# Patient Record
Sex: Female | Born: 1940 | Race: White | Hispanic: No | Marital: Married | Smoking: Never smoker
Health system: Southern US, Community
[De-identification: ages and names within clinical notes are randomized; demographics above are authoritative.]

## PROBLEM LIST (undated history)

## (undated) DIAGNOSIS — Z90710 Acquired absence of both cervix and uterus: Secondary | ICD-10-CM

## (undated) DIAGNOSIS — E079 Disorder of thyroid, unspecified: Secondary | ICD-10-CM

## (undated) DIAGNOSIS — T7840XA Allergy, unspecified, initial encounter: Secondary | ICD-10-CM

## (undated) DIAGNOSIS — R000 Tachycardia, unspecified: Secondary | ICD-10-CM

## (undated) DIAGNOSIS — M1990 Unspecified osteoarthritis, unspecified site: Secondary | ICD-10-CM

## (undated) DIAGNOSIS — K219 Gastro-esophageal reflux disease without esophagitis: Secondary | ICD-10-CM

## (undated) DIAGNOSIS — E785 Hyperlipidemia, unspecified: Secondary | ICD-10-CM

## (undated) DIAGNOSIS — K449 Diaphragmatic hernia without obstruction or gangrene: Secondary | ICD-10-CM

## (undated) HISTORY — DX: Allergy, unspecified, initial encounter: T78.40XA

## (undated) HISTORY — DX: Disorder of thyroid, unspecified: E07.9

## (undated) HISTORY — DX: Acquired absence of both cervix and uterus: Z90.710

## (undated) HISTORY — DX: Unspecified osteoarthritis, unspecified site: M19.90

## (undated) HISTORY — PX: BREAST BIOPSY: SHX20

## (undated) HISTORY — PX: TONSILLECTOMY: SUR1361

## (undated) HISTORY — DX: Hyperlipidemia, unspecified: E78.5

## (undated) HISTORY — DX: Tachycardia, unspecified: R00.0

## (undated) HISTORY — DX: Gastro-esophageal reflux disease without esophagitis: K21.9

## (undated) HISTORY — PX: OTHER SURGICAL HISTORY: SHX169

## (undated) HISTORY — DX: Diaphragmatic hernia without obstruction or gangrene: K44.9

---

## 1978-01-24 HISTORY — PX: ABDOMINAL HYSTERECTOMY: SHX81

## 1987-01-25 DIAGNOSIS — K449 Diaphragmatic hernia without obstruction or gangrene: Secondary | ICD-10-CM

## 1987-01-25 HISTORY — DX: Diaphragmatic hernia without obstruction or gangrene: K44.9

## 1997-01-24 HISTORY — PX: ESOPHAGOGASTRIC FUNDOPLICATION: SHX405

## 1997-01-24 HISTORY — PX: LAPAROSCOPIC NISSEN FUNDOPLICATION: SHX1932

## 1998-09-22 ENCOUNTER — Encounter: Payer: Self-pay | Admitting: Gastroenterology

## 1998-09-22 ENCOUNTER — Ambulatory Visit (HOSPITAL_COMMUNITY): Admission: RE | Admit: 1998-09-22 | Discharge: 1998-09-22 | Payer: Self-pay | Admitting: Gastroenterology

## 1998-09-29 ENCOUNTER — Ambulatory Visit (HOSPITAL_COMMUNITY): Admission: RE | Admit: 1998-09-29 | Discharge: 1998-09-29 | Payer: Self-pay | Admitting: Gastroenterology

## 1998-10-26 ENCOUNTER — Ambulatory Visit (HOSPITAL_COMMUNITY): Admission: RE | Admit: 1998-10-26 | Discharge: 1998-10-26 | Payer: Self-pay | Admitting: Gastroenterology

## 1998-11-27 ENCOUNTER — Ambulatory Visit (HOSPITAL_COMMUNITY): Admission: RE | Admit: 1998-11-27 | Discharge: 1998-11-27 | Payer: Self-pay | Admitting: Gastroenterology

## 1998-11-27 ENCOUNTER — Encounter: Payer: Self-pay | Admitting: Internal Medicine

## 2004-02-26 ENCOUNTER — Ambulatory Visit: Payer: Self-pay | Admitting: General Practice

## 2004-11-17 ENCOUNTER — Ambulatory Visit: Payer: Self-pay | Admitting: Internal Medicine

## 2005-03-14 ENCOUNTER — Ambulatory Visit: Payer: Self-pay | Admitting: Internal Medicine

## 2005-11-14 ENCOUNTER — Ambulatory Visit: Payer: Self-pay | Admitting: Gastroenterology

## 2005-12-20 ENCOUNTER — Ambulatory Visit: Payer: Self-pay | Admitting: Gastroenterology

## 2006-02-14 ENCOUNTER — Ambulatory Visit: Payer: Self-pay | Admitting: Internal Medicine

## 2006-10-20 ENCOUNTER — Ambulatory Visit: Payer: Self-pay | Admitting: Otolaryngology

## 2007-03-27 ENCOUNTER — Ambulatory Visit: Payer: Self-pay | Admitting: Internal Medicine

## 2007-11-20 ENCOUNTER — Ambulatory Visit: Payer: Self-pay | Admitting: Internal Medicine

## 2008-02-21 ENCOUNTER — Ambulatory Visit: Payer: Self-pay | Admitting: Gastroenterology

## 2008-04-09 ENCOUNTER — Ambulatory Visit: Payer: Self-pay | Admitting: Internal Medicine

## 2008-04-22 ENCOUNTER — Ambulatory Visit: Payer: Self-pay | Admitting: Internal Medicine

## 2008-11-04 ENCOUNTER — Ambulatory Visit: Payer: Self-pay | Admitting: General Surgery

## 2009-02-10 ENCOUNTER — Ambulatory Visit: Payer: Self-pay | Admitting: Otolaryngology

## 2009-04-29 ENCOUNTER — Ambulatory Visit: Payer: Self-pay | Admitting: General Surgery

## 2009-06-30 ENCOUNTER — Ambulatory Visit: Payer: Self-pay | Admitting: Unknown Physician Specialty

## 2009-10-29 ENCOUNTER — Ambulatory Visit: Payer: Self-pay | Admitting: Family Medicine

## 2009-12-02 ENCOUNTER — Encounter: Admission: RE | Admit: 2009-12-02 | Discharge: 2009-12-02 | Payer: Self-pay | Admitting: Allergy and Immunology

## 2010-01-11 ENCOUNTER — Ambulatory Visit: Payer: Self-pay | Admitting: Gastroenterology

## 2010-01-13 LAB — PATHOLOGY REPORT

## 2010-05-25 ENCOUNTER — Ambulatory Visit: Payer: Self-pay | Admitting: Family Medicine

## 2010-05-26 ENCOUNTER — Ambulatory Visit: Payer: Self-pay | Admitting: Family Medicine

## 2010-07-30 ENCOUNTER — Ambulatory Visit: Payer: Self-pay | Admitting: Family Medicine

## 2010-12-06 ENCOUNTER — Encounter: Payer: Self-pay | Admitting: Cardiovascular Disease

## 2011-01-03 ENCOUNTER — Ambulatory Visit (INDEPENDENT_AMBULATORY_CARE_PROVIDER_SITE_OTHER): Payer: Medicare Other | Admitting: Cardiovascular Disease

## 2011-01-03 ENCOUNTER — Encounter: Payer: Self-pay | Admitting: Cardiovascular Disease

## 2011-01-03 VITALS — BP 128/78 | Ht 64.0 in | Wt 139.1 lb

## 2011-01-03 DIAGNOSIS — R002 Palpitations: Secondary | ICD-10-CM

## 2011-01-03 DIAGNOSIS — R000 Tachycardia, unspecified: Secondary | ICD-10-CM

## 2011-01-03 DIAGNOSIS — K219 Gastro-esophageal reflux disease without esophagitis: Secondary | ICD-10-CM | POA: Insufficient documentation

## 2011-01-03 DIAGNOSIS — E7800 Pure hypercholesterolemia, unspecified: Secondary | ICD-10-CM | POA: Insufficient documentation

## 2011-01-03 DIAGNOSIS — E785 Hyperlipidemia, unspecified: Secondary | ICD-10-CM

## 2011-01-03 MED ORDER — ATORVASTATIN CALCIUM 10 MG PO TABS: 10.0000 mg | ORAL_TABLET | Freq: Every day | ORAL | Status: DC

## 2011-01-03 NOTE — Progress Notes (Signed)
Patient ID: ZAMZAM WHINERY, female    DOB: [birth year], 70 y.o.   MRN: [id number]  HPI Comments: [name] is a very pleasant 70 year old woman with history of hyperlipidemia, strong family history of coronary artery disease and stroke, history of hilar hernia with a Nissen fundoplication in 1999, chronic GERD symptoms, Barrett's esophagus who presents for evaluation of palpitations. She is a patient of Dr. Andrey Spearman.  She reports that for the past 6 months, she has palpitations when she lies down at night time. She does not appreciate the symptoms in the daytime when she is active, only at night. Symptoms have been getting somewhat worse and that now she has them almost every night. They do not wake her from sleep. She describes them as a strong fluttering though the heart rate is not fast when she checks her pulse. Using a blood pressure cuff has shown normal rate and normal blood pressure. She does have significant anxiety with numerous issues on her mind. Sometimes she has difficulty getting to sleep. She is otherwise active and does not have any other complaints.   She has many siblingsIn many of them have had either stroke or heart attack.  She does have significant symptoms of GERD at nighttime. She has followup with Dr. Baldomero Lamy for repeat EGD in one month's time.  EKG shows normal sinus rhythm with rate 67 beats per minute with no significant ST or T wave changes EKG done at an outside office shows normal LV function, trivial MR and TR, essentially normal study   Outpatient Encounter Prescriptions as of 01/03/2011  Medication Sig Dispense Refill  . omeprazole (PRILOSEC) 20 MG capsule Take 20 mg by mouth daily.        Marland Kitchen atorvastatin (LIPITOR) 10 MG tablet Take 1 tablet (10 mg total) by mouth daily.  30 tablet  11     Review of Systems  Constitutional: Negative.   HENT: Negative.   Eyes: Negative.   Respiratory: Negative.   Cardiovascular: Positive for palpitations.    Gastrointestinal: Negative.        GERD symptoms  Musculoskeletal: Negative.   Skin: Negative.   Neurological: Negative.   Hematological: Negative.   Psychiatric/Behavioral: Negative.   All other systems reviewed and are negative.    BP 128/78  Ht 5\' 4"  (1.626 m)  Wt 139 lb 1.9 oz (63.104 kg)  BMI 23.88 kg/m2   Physical Exam  Nursing note and vitals reviewed. Constitutional: She is oriented to person, place, and time. She appears well-developed and well-nourished.  HENT:  Head: Normocephalic.  Nose: Nose normal.  Mouth/Throat: Oropharynx is clear and moist.  Eyes: Conjunctivae are normal. Pupils are equal, round, and reactive to light.  Neck: Normal range of motion. Neck supple. No JVD present.  Cardiovascular: Normal rate, regular rhythm, S1 normal, S2 normal, normal heart sounds and intact distal pulses.  Exam reveals no gallop and no friction rub.   No murmur heard. Pulmonary/Chest: Effort normal and breath sounds normal. No respiratory distress. She has no wheezes. She has no rales. She exhibits no tenderness.  Abdominal: Soft. Bowel sounds are normal. She exhibits no distension. There is no tenderness.  Musculoskeletal: Normal range of motion. She exhibits no edema and no tenderness.  Lymphadenopathy:    She has no cervical adenopathy.  Neurological: She is alert and oriented to person, place, and time. Coordination normal.  Skin: Skin is warm and dry. No rash noted. No erythema.  Psychiatric: She has a normal mood and  affect. Her behavior is normal. Judgment and thought content normal.         Assessment and Plan

## 2011-01-03 NOTE — Assessment & Plan Note (Signed)
I'm concerned about her strong family history of stroke and heart attack. We have suggested she start low-dose Lipitor 10 mg daily. This can be titrated upwards slowly as needed. We have also suggested she talk with Dr. Marva Panda about starting low-dose aspirin.

## 2011-01-03 NOTE — Assessment & Plan Note (Signed)
We have suggested she take omeprazole 40 mg daily, lift the head of the bed. She has repeat EGD in one month.

## 2011-01-03 NOTE — Assessment & Plan Note (Signed)
I suspect her symptoms are from either anxiety at night time, unable to exclude ectopy. As she reports her heart rate is typically not elevated, it is less likely an atrial tachycardia or SVT. We did offer low dose beta blocker at nighttime p.r.n. And Holter monitor. She does have severe symptoms of heartburn at night time. She would like to see the repeat EGD report before he Holter or any medications. Clinically, EKG and exam is benign. Atrial tachycardia and SVT likely present during the daytime as well as nighttime and likely less likely the cause.  We will see her back in followup in 6 weeks' time after her EGD.

## 2011-01-03 NOTE — Patient Instructions (Addendum)
You are doing well. Please start lipitor 10 mg daily Please call the office if you would like a heart monitor before the next visit Call if you would like the medication to slow the heart rhythm before the next visit  Please call us if you have new issues that need to be addressed before your next appt.  The office will contact you for a follow up Appt. In [phone] [id number]  The number for [facility] in [place] (Dr. Dan Humphreys and Darrick Huntsman) is  [phone]

## 2011-02-14 ENCOUNTER — Ambulatory Visit: Payer: Medicare Other | Admitting: Cardiovascular Disease

## 2011-02-23 ENCOUNTER — Encounter: Payer: Self-pay | Admitting: Cardiovascular Disease

## 2011-02-23 ENCOUNTER — Ambulatory Visit (INDEPENDENT_AMBULATORY_CARE_PROVIDER_SITE_OTHER): Payer: Medicare Other | Admitting: Cardiovascular Disease

## 2011-02-23 DIAGNOSIS — E785 Hyperlipidemia, unspecified: Secondary | ICD-10-CM

## 2011-02-23 DIAGNOSIS — R002 Palpitations: Secondary | ICD-10-CM

## 2011-02-23 DIAGNOSIS — K219 Gastro-esophageal reflux disease without esophagitis: Secondary | ICD-10-CM

## 2011-02-23 NOTE — Assessment & Plan Note (Signed)
We have suggested she could try omeprazole 20 mg b.i.d. For her symptoms. She has an EGD in several weeks.

## 2011-02-23 NOTE — Progress Notes (Signed)
Patient ID: Carrie Noble, female    DOB: Jul 14, 1940, 71 y.o.   MRN: [id number]  HPI Comments: Carrie Noble is a very pleasant 71 year old woman with history of hyperlipidemia, strong family history of coronary artery disease and stroke, history of hilar hernia with a Nissen fundoplication in 1999, chronic GERD symptoms, Barrett's esophagus who presents for evaluation of palpitations. She is a patient of Dr. Andrey Spearman.  She has had a long history of palpitations at nighttime when she tries to sleep, dating back more than 6 months. She continues to have these symptoms, reporting it feels like a fluttering in her upper epigastric area only at nighttime, only when she lies down. She is very active in the daytime and has no symptoms of palpitations, chest pain or shortness of breath with activity. She has had problems in the past with reflux. She does take ramipril all in the morning, with no medications at night. She is scheduled for repeat EGD in late February for her symptoms. She is concerned about arrhythmia though has measured her heart beat using a monitor when she has symptoms and reports heart rates in the 70s. Does feel irregular.  She has not started her low-dose Lipitor as she is afraid. She reports her sister is currently in the hospital having bypass surgery and many other siblings have had either stroke or heart attack.  EKG shows normal sinus rhythm with rate 75 beats per minute with no significant ST or T wave changes   Outpatient Encounter Prescriptions as of 02/23/2011  Medication Sig Dispense Refill  . omeprazole (PRILOSEC) 20 MG capsule Take 20 mg by mouth daily.        Marland Kitchen atorvastatin (LIPITOR) 10 MG tablet Take 1 tablet (10 mg total) by mouth daily. NOT TAKING YET  30 tablet  11    Review of Systems  Constitutional: Negative.   HENT: Negative.   Eyes: Negative.   Respiratory: Negative.   Cardiovascular: Positive for palpitations.  Gastrointestinal: Negative.        GERD  symptoms  Musculoskeletal: Negative.   Skin: Negative.   Neurological: Negative.   Hematological: Negative.   Psychiatric/Behavioral: Negative.   All other systems reviewed and are negative.    BP 136/86  Pulse 75  Ht 5\' 4"  (1.626 m)  Wt 143 lb (64.864 kg)  BMI 24.55 kg/m2   Physical Exam  Nursing note and vitals reviewed. Constitutional: She is oriented to person, place, and time. She appears well-developed and well-nourished.  HENT:  Head: Normocephalic.  Nose: Nose normal.  Mouth/Throat: Oropharynx is clear and moist.  Eyes: Conjunctivae are normal. Pupils are equal, round, and reactive to light.  Neck: Normal range of motion. Neck supple. No JVD present.  Cardiovascular: Normal rate, regular rhythm, S1 normal, S2 normal, normal heart sounds and intact distal pulses.  Exam reveals no gallop and no friction rub.   No murmur heard. Pulmonary/Chest: Effort normal and breath sounds normal. No respiratory distress. She has no wheezes. She has no rales. She exhibits no tenderness.  Abdominal: Soft. Bowel sounds are normal. She exhibits no distension. There is no tenderness.  Musculoskeletal: Normal range of motion. She exhibits no edema and no tenderness.  Lymphadenopathy:    She has no cervical adenopathy.  Neurological: She is alert and oriented to person, place, and time. Coordination normal.  Skin: Skin is warm and dry. No rash noted. No erythema.  Psychiatric: She has a normal mood and affect. Her behavior is normal. Judgment and  thought content normal.         Assessment and Plan

## 2011-02-23 NOTE — Assessment & Plan Note (Signed)
Given her strong family history, we have suggested she start a low-dose cholesterol medication, lipitor 10 mg daily.

## 2011-02-23 NOTE — Patient Instructions (Addendum)
You are doing well. No medication changes were made. We will place a two day monitor for you palpitations. We wil call you with the results.  Please call us if you have new issues that need to be addressed before your next appt.  Your physician wants you to follow-up in: 6 months.  You will receive a reminder letter in the mail two months in advance. If you don't receive a letter, please call our office to schedule the follow-up appointment.    Your physician has recommended that you wear a holter monitor. Holter monitors are medical devices that record the heart's electrical activity. Doctors most often use these monitors to diagnose arrhythmias. Arrhythmias are problems with the speed or rhythm of the heartbeat. The monitor is a small, portable device. You can wear one while you do your normal daily activities. This is usually used to diagnose what is causing palpitations/syncope (passing out).

## 2011-02-23 NOTE — Assessment & Plan Note (Signed)
She continues to be concerned about the fluttering in her upper epigastric area at nighttime. We will order a Holter monitor for 48 hours to evaluate her symptoms.   She denies any problems with exertion and although she does have a strong family history, we will hold off on stress testing at this time.

## 2011-03-04 ENCOUNTER — Telehealth: Payer: Self-pay

## 2011-03-04 NOTE — Telephone Encounter (Signed)
Notified patient per Dr. Mariah Milling Holter Monitor did not show any significant arrhythmias.

## 2011-03-07 NOTE — Progress Notes (Signed)
Addended by: Festus Aloe on: 03/07/2011 10:42 AM   Modules accepted: Orders

## 2011-03-22 ENCOUNTER — Ambulatory Visit: Payer: Self-pay | Admitting: Gastroenterology

## 2011-07-06 ENCOUNTER — Ambulatory Visit: Payer: Self-pay | Admitting: Gastroenterology

## 2011-10-26 ENCOUNTER — Ambulatory Visit: Payer: Self-pay | Admitting: Internal Medicine

## 2011-11-25 ENCOUNTER — Ambulatory Visit: Payer: Self-pay | Admitting: Internal Medicine

## 2011-12-27 ENCOUNTER — Ambulatory Visit: Payer: Medicare Other | Admitting: Internal Medicine

## 2012-01-25 HISTORY — PX: BREAST EXCISIONAL BIOPSY: SUR124

## 2012-05-28 ENCOUNTER — Ambulatory Visit (INDEPENDENT_AMBULATORY_CARE_PROVIDER_SITE_OTHER): Payer: Medicare Other | Admitting: Internal Medicine

## 2012-05-28 ENCOUNTER — Encounter: Payer: Self-pay | Admitting: Internal Medicine

## 2012-05-28 VITALS — BP 130/80 | HR 72 | Temp 98.5°F | Wt 141.5 lb

## 2012-05-28 DIAGNOSIS — E785 Hyperlipidemia, unspecified: Secondary | ICD-10-CM

## 2012-05-28 DIAGNOSIS — E041 Nontoxic single thyroid nodule: Secondary | ICD-10-CM | POA: Insufficient documentation

## 2012-05-28 DIAGNOSIS — K2270 Barrett's esophagus without dysplasia: Secondary | ICD-10-CM

## 2012-05-28 DIAGNOSIS — M810 Age-related osteoporosis without current pathological fracture: Secondary | ICD-10-CM

## 2012-05-28 DIAGNOSIS — E049 Nontoxic goiter, unspecified: Secondary | ICD-10-CM

## 2012-05-28 DIAGNOSIS — M25559 Pain in unspecified hip: Secondary | ICD-10-CM

## 2012-05-28 DIAGNOSIS — K219 Gastro-esophageal reflux disease without esophagitis: Secondary | ICD-10-CM

## 2012-05-28 DIAGNOSIS — R002 Palpitations: Secondary | ICD-10-CM

## 2012-05-28 DIAGNOSIS — M25551 Pain in right hip: Secondary | ICD-10-CM

## 2012-05-28 MED ORDER — PANTOPRAZOLE SODIUM 40 MG PO TBEC: 40.0000 mg | DELAYED_RELEASE_TABLET | Freq: Every day | ORAL | Status: DC

## 2012-05-29 ENCOUNTER — Encounter: Payer: Self-pay | Admitting: Internal Medicine

## 2012-05-29 DIAGNOSIS — M810 Age-related osteoporosis without current pathological fracture: Secondary | ICD-10-CM | POA: Insufficient documentation

## 2012-05-29 NOTE — Progress Notes (Signed)
Subjective:    Patient ID: Carrie Noble, female    DOB: 1940/01/27, 72 y.o.   MRN: [id number]  HPI 72 year old female with past history of GERD with Barretts and a hiatal hernia, arthritis, hypercholesterolemia, thyroid goiter and osteoporosis.  She comes in today to follow up on these issues as well as to establish care.  Former patient of Dr Alison Murray, Dr Andrey Spearman and Dr Barnabas Lister.  She states she stays active.  No cardiac symptoms with increased activity or exertion.  She is having some right hip pain.  She also reports some pain in her tail bone.  She has had xrays previously and was told she had arthritis.  Had previously had injection (hip) by Dr Gavin Potters.  This helped.  Flaring again.  She has acid reflux.  On prilosec.  Has had issues with prilosec previously and wants to change to a different PPI.  Had palpitations.  Saw Dr Mariah Milling.  Had monitor - ok.  Once she stopped prilosec and started prevacid - symptoms resolved.  States when her last rx called in - was called in for prilosec.  She wants to change now.  Has known Barretts.  Followed by Dr Marva Panda.  Has IBS. Metamucil helps.     Past Medical History  Diagnosis Date  . Hyperlipidemia   . Gastroesophageal reflux   . Hiatal hernia 1989    status post Nissen fundoplication   . Hx of hysterectomy   . Tachycardia     Patient stated that this has been occuring frequently.  . Arthritis   . Allergy   . Thyroid disease     Goiter    Outpatient Encounter Prescriptions as of 05/28/2012  Medication Sig Dispense Refill  . azelastine (ASTELIN) 137 MCG/SPRAY nasal spray Place 1 spray into the nose as needed for rhinitis. Use in each nostril as directed      . Cholecalciferol (VITAMIN D PO) Take by mouth daily.      . [DISCONTINUED] omeprazole (PRILOSEC) 20 MG capsule Take 20 mg by mouth daily.        Marland Kitchen atorvastatin (LIPITOR) 10 MG tablet Take 1 tablet (10 mg total) by mouth daily.  30 tablet  11  . pantoprazole (PROTONIX) 40 MG tablet Take  1 tablet (40 mg total) by mouth daily.  30 tablet  3   No facility-administered encounter medications on file as of 05/28/2012.    Review of Systems Patient denies any headache, lightheadedness or dizziness.  No significant sinus or allergy symptoms.  No chest pain, tightness or significant palpitations.  Active with no cardiac symptoms. No increased shortness of breath, cough or congestion.  No nausea or vomiting.  Acid reflux controlled if she takes her medications and watches what she eats.  No abdominal pain or cramping.  No significant bowel change, such as diarrhea, BRBPR or melana.  Minimal constipation. No urine change.  Desires not to take cholesterol medicaton.      Objective:   Physical Exam Filed Vitals:   05/28/12 0946  BP: 130/80  Pulse: 72  Temp: 98.5 F (25.36 C)   72 year old female in no acute distress.   HEENT:  Nares- clear.  Oropharynx - without lesions. NECK:  Supple.  Nontender.  No audible bruit.  HEART:  Appears to be regular. LUNGS:  No crackles or wheezing audible.  Respirations even and unlabored.  RADIAL PULSE:  Equal bilaterally.  ABDOMEN:  Soft, nontender.  Bowel sounds present and normal.  No audible  abdominal bruit.    EXTREMITIES:  No increased edema present.  DP pulses palpable and equal bilaterally.      SKIN:  No rash. MSK:  Some minimal increased discomfort right hip with extension of right leg.  Appears to have good rom.      Assessment & Plan:  MSK.  Back and hip pain as outlined.  Has been told she has arthritis. Obtain xray results.  Stays active.  Will refer to PT for evaluation and treatment.    HEALTH MAINTENANCE.  Schedule her for a physical next visit.  Schedule mammogram when due.  Had colonoscopy.  Will need to obtain results. States due colonoscopy.  Wants to have next year with her repeat EGD.   I spent 45 minutes with the patient and more than 50% of the time was spent in consultation regarding the above.

## 2012-05-30 ENCOUNTER — Telehealth: Payer: Self-pay | Admitting: Internal Medicine

## 2012-05-30 ENCOUNTER — Ambulatory Visit: Payer: Self-pay | Admitting: Internal Medicine

## 2012-05-30 DIAGNOSIS — E079 Disorder of thyroid, unspecified: Secondary | ICD-10-CM

## 2012-05-30 NOTE — Telephone Encounter (Signed)
Pt notified of thyroid ultrasound results and need for referral to endocrinology for evaluation and question of need for biopsy.  Order for referral made.

## 2012-05-31 ENCOUNTER — Encounter: Payer: Self-pay | Admitting: Internal Medicine

## 2012-05-31 NOTE — Assessment & Plan Note (Signed)
Sees Dr Marva Panda.  Last EGD 03/22/11.  Due a follow up colonoscopy.  States she wants to have this done with next EGD in 2015.  Change to protonix as outlined.

## 2012-05-31 NOTE — Assessment & Plan Note (Signed)
Wants to stop prilosec.  Will start protonix 40mg  q day.  Follow.  Continues to follow up with Dr Marva Panda for Barretts.

## 2012-05-31 NOTE — Assessment & Plan Note (Signed)
Desires not to take lipitor.  Low cholesterol diet and exercise.  Check lipid panel.

## 2012-05-31 NOTE — Assessment & Plan Note (Signed)
Right thyroid fullness.  Will check thyroid ultrasound.  Also check thyroid function.  Further w/up pending results.

## 2012-05-31 NOTE — Assessment & Plan Note (Signed)
Check vitamin D level.  Obtain outside records for review.  Follow.

## 2012-05-31 NOTE — Assessment & Plan Note (Signed)
Saw Dr Mariah Milling.  See his note for details.  She feels that the palpitations improved with stopping the prilosec.  Not a significant issue for her now.  Follow.

## 2012-06-11 ENCOUNTER — Telehealth: Payer: Self-pay | Admitting: *Deleted

## 2012-06-11 ENCOUNTER — Encounter: Payer: Medicare Other | Admitting: Internal Medicine

## 2012-06-11 ENCOUNTER — Other Ambulatory Visit (INDEPENDENT_AMBULATORY_CARE_PROVIDER_SITE_OTHER): Payer: 59

## 2012-06-11 ENCOUNTER — Ambulatory Visit: Payer: 59

## 2012-06-11 ENCOUNTER — Other Ambulatory Visit: Payer: Medicare Other

## 2012-06-11 DIAGNOSIS — M810 Age-related osteoporosis without current pathological fracture: Secondary | ICD-10-CM

## 2012-06-11 DIAGNOSIS — E785 Hyperlipidemia, unspecified: Secondary | ICD-10-CM

## 2012-06-11 DIAGNOSIS — R002 Palpitations: Secondary | ICD-10-CM

## 2012-06-11 DIAGNOSIS — E049 Nontoxic goiter, unspecified: Secondary | ICD-10-CM

## 2012-06-11 DIAGNOSIS — K2270 Barrett's esophagus without dysplasia: Secondary | ICD-10-CM

## 2012-06-11 LAB — LIPID PANEL
Cholesterol: 262 mg/dL — ABNORMAL HIGH (ref 0–200)
HDL: 53.5 mg/dL (ref >39.00)
Triglycerides: 172 mg/dL — ABNORMAL HIGH (ref 0.0–149.0)
VLDL: 34.4 mg/dL (ref 0.0–40.0)

## 2012-06-11 LAB — CBC WITH DIFFERENTIAL/PLATELET
Basophils Relative: 1.1 % (ref 0.0–3.0)
Eosinophils Relative: 3 % (ref 0.0–5.0)
HCT: 39.4 % (ref 36.0–46.0)
Lymphs Abs: 1.8 10*3/uL (ref 0.7–4.0)
MCHC: 34.6 g/dL (ref 30.0–36.0)
MCV: 93.1 fl (ref 78.0–100.0)
Monocytes Absolute: 0.3 10*3/uL (ref 0.1–1.0)
Neutro Abs: 2.7 10*3/uL (ref 1.4–7.7)
RBC: 4.23 Mil/uL (ref 3.87–5.11)
WBC: 5 10*3/uL (ref 4.5–10.5)

## 2012-06-11 LAB — COMPREHENSIVE METABOLIC PANEL
Albumin: 4.2 g/dL (ref 3.5–5.2)
Alkaline Phosphatase: 91 U/L (ref 39–117)
BUN: 15 mg/dL (ref 6–23)
Creatinine, Ser: 0.9 mg/dL (ref 0.4–1.2)
Glucose, Bld: 102 mg/dL — ABNORMAL HIGH (ref 70–99)
Total Bilirubin: 0.9 mg/dL (ref 0.3–1.2)

## 2012-06-11 LAB — LDL CHOLESTEROL, DIRECT: Direct LDL: 169.8 mg/dL

## 2012-06-11 LAB — T4, FREE: Free T4: 0.84 ng/dL (ref 0.60–1.60)

## 2012-06-11 LAB — MAGNESIUM: Magnesium: 2.2 mg/dL (ref 1.5–2.5)

## 2012-06-11 LAB — TSH: TSH: 3.4 u[IU]/mL (ref 0.35–5.50)

## 2012-06-11 NOTE — Telephone Encounter (Signed)
For the magnesium, I can use palpitations as a diabnosis.  For B12, I don't have a covered diagnosis listed.  Can see if pt has a history of b12 deficiency or if willing to sign a waiver for b12 to be checked.

## 2012-06-11 NOTE — Telephone Encounter (Signed)
Tried called pt, didn't get an answer

## 2012-06-11 NOTE — Telephone Encounter (Signed)
Pt came in for labs and would like to know if you can add a Vitamin b12 and magnesium

## 2012-06-14 ENCOUNTER — Encounter: Payer: Self-pay | Admitting: *Deleted

## 2012-06-14 ENCOUNTER — Telehealth: Payer: Self-pay | Admitting: Internal Medicine

## 2012-06-14 NOTE — Telephone Encounter (Signed)
Spoke with pt & informed her of lab results (mailed letter also)

## 2012-06-14 NOTE — Telephone Encounter (Signed)
Patient wanting lab results . It is ok to give the results to her husband if she is not at home.

## 2012-06-22 ENCOUNTER — Encounter: Payer: Self-pay | Admitting: Internal Medicine

## 2012-06-28 ENCOUNTER — Encounter: Payer: Self-pay | Admitting: Internal Medicine

## 2012-07-30 ENCOUNTER — Encounter: Payer: Medicare Other | Admitting: Internal Medicine

## 2012-08-01 ENCOUNTER — Encounter: Payer: Self-pay | Admitting: Internal Medicine

## 2012-08-01 ENCOUNTER — Ambulatory Visit (INDEPENDENT_AMBULATORY_CARE_PROVIDER_SITE_OTHER): Payer: 59 | Admitting: Internal Medicine

## 2012-08-01 VITALS — BP 110/80 | HR 71 | Temp 97.8°F | Ht 64.0 in | Wt 141.2 lb

## 2012-08-01 DIAGNOSIS — K219 Gastro-esophageal reflux disease without esophagitis: Secondary | ICD-10-CM

## 2012-08-01 DIAGNOSIS — Z1239 Encounter for other screening for malignant neoplasm of breast: Secondary | ICD-10-CM

## 2012-08-01 DIAGNOSIS — R739 Hyperglycemia, unspecified: Secondary | ICD-10-CM

## 2012-08-01 DIAGNOSIS — M810 Age-related osteoporosis without current pathological fracture: Secondary | ICD-10-CM

## 2012-08-01 DIAGNOSIS — K2270 Barrett's esophagus without dysplasia: Secondary | ICD-10-CM

## 2012-08-01 DIAGNOSIS — E785 Hyperlipidemia, unspecified: Secondary | ICD-10-CM

## 2012-08-01 DIAGNOSIS — Z1211 Encounter for screening for malignant neoplasm of colon: Secondary | ICD-10-CM

## 2012-08-01 DIAGNOSIS — E049 Nontoxic goiter, unspecified: Secondary | ICD-10-CM

## 2012-08-01 DIAGNOSIS — R7309 Other abnormal glucose: Secondary | ICD-10-CM

## 2012-08-01 DIAGNOSIS — R002 Palpitations: Secondary | ICD-10-CM

## 2012-08-01 MED ORDER — LANSOPRAZOLE 30 MG PO CPDR: 30.0000 mg | DELAYED_RELEASE_CAPSULE | Freq: Every day | ORAL | Status: DC

## 2012-08-02 ENCOUNTER — Encounter: Payer: Self-pay | Admitting: Internal Medicine

## 2012-08-02 NOTE — Assessment & Plan Note (Signed)
Recent vitamin D level wnl.   

## 2012-08-02 NOTE — Assessment & Plan Note (Signed)
Thyroid ultrasound with thyroid mass present.  Was referred to Dr Renae Fickle.  Had biopsy.  Need results.

## 2012-08-02 NOTE — Assessment & Plan Note (Signed)
Desires not to take lipitor.  Low cholesterol diet and exercise.  Follow.     

## 2012-08-02 NOTE — Assessment & Plan Note (Signed)
Saw Dr Mariah Milling.  See his note for details.  She feels that the palpitations improved with stopping the prilosec.  Has noticed some minimal palpitations with protonix.  Will change back to prevacid.  Follow.  Very active.  No cardiac symptoms with increased activity or exertion.

## 2012-08-02 NOTE — Assessment & Plan Note (Signed)
Wants to stop protonix.  Will start prevacid 30mg  q day.  Follow.  Continues to follow up with Dr Marva Panda for Barretts.  Refer back to Dr Marva Panda as outlined.

## 2012-08-02 NOTE — Assessment & Plan Note (Signed)
Sees Dr Marva Panda.  Last EGD 03/22/11.  Due a follow up colonoscopy.  States she wants to have this done with next EGD.  EGD was due in 2015.  Apparently has discussed with Dr Marva Panda.  States he will do both this year.  Refer to GI for evaluation and follow up scopes.  Will change protonix to prevacid.  Follow.

## 2012-08-02 NOTE — Progress Notes (Signed)
Subjective:    Patient ID: Carrie Noble, female    DOB: 25-Apr-1940, 72 y.o.   MRN: [id number]  HPI 72 year old female with past history of GERD with Barretts and a hiatal hernia, arthritis, hypercholesterolemia, thyroid goiter and osteoporosis.  She comes in today to follow up on these issues as well as for a complete physical exam.  She is staying active.  No cardiac symptoms with increased activity or exertion.  She has been having some right hip pain.  She also reports some pain in her tail bone.  She has had xrays previously and was told she had arthritis.  Had previously had injection (hip) by Dr Gavin Potters.  This helped.  Flaring again last visit.  States today - the hip is better.  Still some pain in her tailbone.  Desires no further w/up at this time.  She has acid reflux.   Has had issues with prilosec previously and wanted to change to a different PPI.  Had palpitations.  Saw Dr Mariah Milling.  Had monitor - ok.  Once she stopped prilosec and started prevacid - symptoms resolved.  Was changed to protonix last visit.  Felt better initially, but now feels that she may have some palpitations related to the protonix.  Wants to go back to prevacid.   Has known Barretts.  Followed by Dr Marva Panda.  Has IBS. Metamucil helps.     Past Medical History  Diagnosis Date  . Hyperlipidemia   . Gastroesophageal reflux   . Hiatal hernia 1989    status post Nissen fundoplication   . Hx of hysterectomy   . Tachycardia     Patient stated that this has been occuring frequently.  . Arthritis   . Allergy   . Thyroid disease     Goiter    Outpatient Encounter Prescriptions as of 08/01/2012  Medication Sig Dispense Refill  . Biotin 1000 MCG tablet Take 1,000 mcg by mouth daily.      . Cholecalciferol (VITAMIN D PO) Take by mouth daily.      . [DISCONTINUED] pantoprazole (PROTONIX) 40 MG tablet Take 1 tablet (40 mg total) by mouth daily.  30 tablet  3  . lansoprazole (PREVACID) 30 MG capsule Take 1 capsule (30 mg  total) by mouth daily.  30 capsule  3  . [DISCONTINUED] azelastine (ASTELIN) 137 MCG/SPRAY nasal spray Place 1 spray into the nose as needed for rhinitis. Use in each nostril as directed       No facility-administered encounter medications on file as of 08/01/2012.    Review of Systems Patient denies any headache, lightheadedness or dizziness.  No significant sinus or allergy symptoms.  No chest pain, tightness or significant palpitations.  Active with no cardiac symptoms. No increased shortness of breath, cough or congestion.  No nausea or vomiting.  No abdominal pain or cramping.  No significant bowel change, such as diarrhea, BRBPR or melana.  No urine change.  Cholesterol elevated.   Desires not to take cholesterol medicaton.  Discussed diet and exercise.  Wants to change form protonix back to prevacid.  Hip pain is better.  Still some pain in her tailbone.  Desires no further w/up.        Objective:   Physical Exam  Filed Vitals:   08/01/12 1352  BP: 110/80  Pulse: 71  Temp: 97.8 F (36.6 C)   Pulse 23  72 year old female in no acute distress.   HEENT:  Nares- clear.  Oropharynx - without  lesions. NECK:  Supple.  Nontender.  No audible bruit.  HEART:  Appears to be regular. LUNGS:  No crackles or wheezing audible.  Respirations even and unlabored.  RADIAL PULSE:  Equal bilaterally.    BREASTS:  No nipple discharge or nipple retraction present.  Could not appreciate any distinct nodules or axillary adenopathy.  ABDOMEN:  Soft, nontender.  Bowel sounds present and normal.  No audible abdominal bruit.  GU:  Normal external genitalia.  Vaginal vault without lesions.  S/p hysterectomy.  Could not appreciate any adnexal masses or tenderness.   RECTAL:  Heme negative.   EXTREMITIES:  No increased edema present.  DP pulses palpable and equal bilaterally.        Assessment & Plan:  MSK.  Hip pain is better.  Still with some pain in her tail bone.  Desires no further w/up.  Follow.     HEALTH MAINTENANCE.  Physical today.  Schedule mammogram. Due a f/u colonoscopy.

## 2012-09-05 ENCOUNTER — Encounter: Payer: Self-pay | Admitting: Internal Medicine

## 2012-09-05 ENCOUNTER — Ambulatory Visit (INDEPENDENT_AMBULATORY_CARE_PROVIDER_SITE_OTHER): Payer: 59 | Admitting: Internal Medicine

## 2012-09-05 VITALS — BP 120/80 | HR 64 | Temp 98.1°F | Ht 64.0 in | Wt 143.5 lb

## 2012-09-05 DIAGNOSIS — E785 Hyperlipidemia, unspecified: Secondary | ICD-10-CM

## 2012-09-05 DIAGNOSIS — R928 Other abnormal and inconclusive findings on diagnostic imaging of breast: Secondary | ICD-10-CM

## 2012-09-05 DIAGNOSIS — R002 Palpitations: Secondary | ICD-10-CM

## 2012-09-05 DIAGNOSIS — M810 Age-related osteoporosis without current pathological fracture: Secondary | ICD-10-CM

## 2012-09-05 DIAGNOSIS — K2270 Barrett's esophagus without dysplasia: Secondary | ICD-10-CM

## 2012-09-05 DIAGNOSIS — R109 Unspecified abdominal pain: Secondary | ICD-10-CM

## 2012-09-05 DIAGNOSIS — E049 Nontoxic goiter, unspecified: Secondary | ICD-10-CM

## 2012-09-05 DIAGNOSIS — R1031 Right lower quadrant pain: Secondary | ICD-10-CM

## 2012-09-05 DIAGNOSIS — L989 Disorder of the skin and subcutaneous tissue, unspecified: Secondary | ICD-10-CM

## 2012-09-05 DIAGNOSIS — K219 Gastro-esophageal reflux disease without esophagitis: Secondary | ICD-10-CM

## 2012-09-05 DIAGNOSIS — N63 Unspecified lump in unspecified breast: Secondary | ICD-10-CM

## 2012-09-06 ENCOUNTER — Ambulatory Visit: Payer: Self-pay | Admitting: Internal Medicine

## 2012-09-06 DIAGNOSIS — R928 Other abnormal and inconclusive findings on diagnostic imaging of breast: Secondary | ICD-10-CM | POA: Insufficient documentation

## 2012-09-07 ENCOUNTER — Encounter: Payer: Self-pay | Admitting: Internal Medicine

## 2012-09-07 NOTE — Assessment & Plan Note (Signed)
Sees Dr Marva Panda.  Last EGD 03/22/11.  Due a follow up colonoscopy.  Refer back to GI secondary to symptoms and ongoing GI issues as outlined.

## 2012-09-07 NOTE — Progress Notes (Signed)
Subjective:    Patient ID: Carrie Noble, female    DOB: 06-18-1940, 72 y.o.   MRN: [id number]  HPI 72 year old female with past history of GERD with Barretts and a hiatal hernia, arthritis, hypercholesterolemia, thyroid goiter and osteoporosis.  She comes in today for a scheduled follow up.  She is staying active.  No cardiac symptoms with increased activity or exertion.  She is back on prevacid.  See last note for details.  Reports her stomach is bloated.  Some increased pressure.  Takes TUMS prn.  Taking the prevacid in the evening.  Has intolerance to zantac.   Reports some abdominal discomfort and lower pelvic discomfort.  She also recently saw Dr Gwen Pounds.  Had holter monitor.  No significant arrhythmia.  Breathing stable.  States due a colonoscopy.  Was trying to wait until next year.  She is also concerned regarding a palpable area of concern in her left breast.  No nipple discharge.     Past Medical History  Diagnosis Date  . Hyperlipidemia   . Gastroesophageal reflux   . Hiatal hernia 1989    status post Nissen fundoplication   . Hx of hysterectomy   . Tachycardia     Patient stated that this has been occuring frequently.  . Arthritis   . Allergy   . Thyroid disease     Goiter    Outpatient Encounter Prescriptions as of 09/05/2012  Medication Sig Dispense Refill  . Biotin 1000 MCG tablet Take 1,000 mcg by mouth daily.      . Calcium Carbonate (CALCIUM 600 PO) Take by mouth daily.      . Cholecalciferol (VITAMIN D PO) Take by mouth daily.      . lansoprazole (PREVACID) 30 MG capsule Take 1 capsule (30 mg total) by mouth daily.  30 capsule  3   No facility-administered encounter medications on file as of 09/05/2012.    Review of Systems Patient denies any headache, lightheadedness or dizziness.  No significant sinus or allergy symptoms.  No chest pain, tightness or significant palpitations.  Active with no cardiac symptoms. No increased shortness of breath, cough or  congestion.   Does report the stomach discomfort and bloating as outlined.  No significant bowel change, such as diarrhea, BRBPR or melana.  No urine change.  Cholesterol elevated.   Desires not to take cholesterol medicaton.  Discussed diet and exercise.      Objective:   Physical Exam  Filed Vitals:   09/05/12 0842  BP: 120/80  Pulse: 64  Temp: 98.1 F (71.69 C)   72 year old female in no acute distress.   HEENT:  Nares- clear.  Oropharynx - without lesions. NECK:  Supple.  Nontender.  No audible bruit.  Right thyroid fullness.  HEART:  Appears to be regular. LUNGS:  No crackles or wheezing audible.  Respirations even and unlabored.  RADIAL PULSE:  Equal bilaterally.    BREASTS:  No nipple discharge or nipple retraction present.  Some palpable fullness 9:00 left breast and right breast.  Also palpable area 2:00 right breast.   ABDOMEN:  Soft, nontender.  Bowel sounds present and normal.  No audible abdominal bruit.  EXTREMITIES:  No increased edema present.  DP pulses palpable and equal bilaterally.        Assessment & Plan:  BREAST NODULE.  Exam as outlined.  Schedule bilateral diagnostic mammogram and ultrasound.   DERMATOLOGY.  Persistent facial lesion.  Refer to dermatology.    MSK.  Desires no further w/up.  Follow.    HEALTH MAINTENANCE.  Physical last visit.  Schedule bilateral diagnostic mammogram as outlined.  Due a f/u colonoscopy.   Refer to GI as outlined.

## 2012-09-07 NOTE — Assessment & Plan Note (Signed)
Desires not to take lipitor.  Low cholesterol diet and exercise.  Follow.     

## 2012-09-07 NOTE — Assessment & Plan Note (Signed)
On prevacid with symptoms as outlined.  Will increase prevacid to bid.  Will obtain abdominal and pelvic ultrasound, given the abdominal pain and the pelvic pain.  Refer back to Dr Marva Panda.  Has known Barretts.  States due colonoscopy.   May need f/u EGD as well.

## 2012-09-07 NOTE — Assessment & Plan Note (Signed)
Recent vitamin D level wnl.   

## 2012-09-07 NOTE — Assessment & Plan Note (Signed)
Right thyroid fullness.  Saw endocrinology.  Had biopsy.  States everything checked out fine.  Follow.

## 2012-09-07 NOTE — Assessment & Plan Note (Signed)
Stable.  States just saw Dr Gwen Pounds.   Had holter.  Reports everything checked out fine.  Follow.

## 2012-09-10 ENCOUNTER — Ambulatory Visit: Payer: Self-pay | Admitting: Internal Medicine

## 2012-09-11 ENCOUNTER — Telehealth: Payer: Self-pay | Admitting: Internal Medicine

## 2012-09-11 DIAGNOSIS — R928 Other abnormal and inconclusive findings on diagnostic imaging of breast: Secondary | ICD-10-CM

## 2012-09-11 MED ORDER — FLUOXETINE HCL 10 MG PO CAPS: 10.0000 mg | ORAL_CAPSULE | Freq: Every day | ORAL | Status: DC

## 2012-09-11 MED ORDER — LANSOPRAZOLE 30 MG PO CPDR: 30.0000 mg | DELAYED_RELEASE_CAPSULE | Freq: Two times a day (BID) | ORAL | Status: DC

## 2012-09-11 NOTE — Telephone Encounter (Signed)
Pt notified of mammogram and ultrasound results.  Notified of Birads IV.  Refer to Dr Lemar Livings for evaluation and question of need for biopsy.   Pt also notified of ultrasound results.  Fatty liver.  No other acute abnormality.  Will increase prevacid to bid and start prozac 10mg  q day.  Follow.

## 2012-09-12 ENCOUNTER — Encounter: Payer: Self-pay | Admitting: General Surgery

## 2012-09-17 ENCOUNTER — Encounter: Payer: Self-pay | Admitting: General Surgery

## 2012-09-17 ENCOUNTER — Ambulatory Visit (INDEPENDENT_AMBULATORY_CARE_PROVIDER_SITE_OTHER): Payer: 59 | Admitting: General Surgery

## 2012-09-17 VITALS — BP 120/68 | HR 74 | Resp 12 | Ht 64.0 in | Wt 141.0 lb

## 2012-09-17 DIAGNOSIS — R928 Other abnormal and inconclusive findings on diagnostic imaging of breast: Secondary | ICD-10-CM

## 2012-09-17 DIAGNOSIS — N63 Unspecified lump in unspecified breast: Secondary | ICD-10-CM

## 2012-09-17 DIAGNOSIS — N6489 Other specified disorders of breast: Secondary | ICD-10-CM | POA: Insufficient documentation

## 2012-09-17 NOTE — Patient Instructions (Addendum)

## 2012-09-17 NOTE — Progress Notes (Signed)
Patient ID: Carrie Noble, female   DOB: 12-27-1940, 72 y.o.   MRN: [id number]  No chief complaint on file.   HPI Carrie Noble is a 72 y.o. female  Here for post mammogram. Patient did feel a knot in her left breast. She had an ultrasound and mammogram done at [facility] on 09/06/12. She states that she has had some tenderness of the right breast.  The patient thought she had noticed some thickening in the right breast, but reports this was not confirmed during a recent physical examination with Dr. Lorin Picket.  The patient undergone excision of a right breast cyst in 2010 with a vacuum biopsy. At the time of her may her mammograms and clinical exam were unremarkable. HPI  Past Medical History  Diagnosis Date  . Hyperlipidemia   . Gastroesophageal reflux   . Hiatal hernia 1989    status post Nissen fundoplication   . Hx of hysterectomy   . Tachycardia     Patient stated that this has been occuring frequently.  . Arthritis   . Allergy   . Thyroid disease     Goiter    Past Surgical History  Procedure Laterality Date  . Tonsillectomy      as well as goiter  . Laparoscopic nissen fundoplication  1999  . Abdominal hysterectomy  1980    partial  . Esophagogastric fundoplication  1999  . Breast biopsy Right   . Breast biopsy Left     Family History  Problem Relation Age of Onset  . Stroke Mother 36  . Arthritis Mother   . Heart disease Mother   . Hypertension Mother   . Stroke Father 44  . Hypertension Father   . Rheumatic fever Brother     and multiple open heart surgeries  . Diabetes Brother     type 2  . Stroke Brother   . Other Sister     Coronary Atherosclerosis  . Stroke Brother   . Stroke Sister   . Heart disease Sister   . Dementia Sister   . Cancer Sister     ovarian    Social History History  Substance Use Topics  . Smoking status: Never Smoker   . Smokeless tobacco: Never Used  . Alcohol Use: No    Allergies  Allergen Reactions  . Darvocet  [Propoxyphene-Acetaminophen]     Patient stated that medication made her heart beat fast.  . Morphine And Related     Patient stated that medication made her heart beat fast.    Current Outpatient Prescriptions  Medication Sig Dispense Refill  . vitamin B-12 (CYANOCOBALAMIN) 1000 MCG tablet Take 1,000 mcg by mouth daily.      . Calcium Carbonate (CALCIUM 600 PO) Take by mouth daily.      . Cholecalciferol (VITAMIN D PO) Take by mouth daily.      Marland Kitchen FLUoxetine (PROZAC) 10 MG capsule Take 1 capsule (10 mg total) by mouth daily.  30 capsule  2  . lansoprazole (PREVACID) 30 MG capsule Take 1 capsule (30 mg total) by mouth 2 (two) times daily.  60 capsule  3   No current facility-administered medications for this visit.    Review of Systems Review of Systems  Constitutional: Negative.   Respiratory: Negative.   Cardiovascular: Negative.     Blood pressure 120/68, pulse 74, resp. rate 12, height 5\' 4"  (1.626 m), weight 141 lb (63.957 kg).  Physical Exam Physical Exam  Constitutional: She is oriented to person, place,  and time. She appears well-developed and well-nourished.  Neck: Neck supple.  Cardiovascular: Normal rate, regular rhythm and normal heart sounds.   Pulmonary/Chest: Effort normal and breath sounds normal. Right breast exhibits no inverted nipple, no mass, no nipple discharge, no skin change and no tenderness. Left breast exhibits no inverted nipple, no mass, no nipple discharge, no skin change and no tenderness.  Left breast 1/2 cup size larger than right.    Lymphadenopathy:    She has no cervical adenopathy.  Neurological: She is alert and oriented to person, place, and time.    Data Reviewed Bilateral mammograms on September 06, 2012 showed heterogeneously dense breasts. Focal spot compression views were unremarkable.  Ultrasound examination of the breast bilaterally were unremarkable on the right, but notable on the left for a 0.6 x 0.9 x 1.1 cm hypoechoic nodule the  2:00 position. BI-RAD-4.  Ultrasound examination of the upper outer quadrant right breast was completed (no images, no charge. No architectural distortion, cystic or solid lesions were identified in the area of the patient's concern.  Left breast ultrasound confirmed a 0.6 x 1.0 x 1.2 cm hypoechoic area with acoustic shadowing. The patient was amenable to a vacuum biopsy. 10 cc of 0.5% Xylocaine with 0.25% Marcaine with 1:200,000 units of epinephrine was utilized a well-tolerated. Chlor prep was applied to the skin. A 14-gauge Finesse biopsy was used and approximately 8 core samples were obtained. A postbiopsy clip was placed. The skin defect was closed with benzoin and Steri-Strips followed by Telfa and Tegaderm dressing. The procedure was well tolerated.   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     Plan    The patient will be contacted once pathology is available. Nursing followup will take place in one week.        Earline Mayotte 09/17/2012, 9:15 PM

## 2012-09-19 ENCOUNTER — Encounter: Payer: Self-pay | Admitting: Internal Medicine

## 2012-09-19 ENCOUNTER — Telehealth: Payer: Self-pay | Admitting: *Deleted

## 2012-09-19 LAB — PATHOLOGY

## 2012-09-19 NOTE — Telephone Encounter (Signed)
Notified patient as instructed, no cancer per Dr. Byrnett, patient pleased. Discussed follow-up appointments nurse next week, patient agrees 

## 2012-09-26 ENCOUNTER — Ambulatory Visit (INDEPENDENT_AMBULATORY_CARE_PROVIDER_SITE_OTHER): Payer: 59 | Admitting: *Deleted

## 2012-09-26 DIAGNOSIS — N63 Unspecified lump in unspecified breast: Secondary | ICD-10-CM

## 2012-09-26 NOTE — Patient Instructions (Signed)
The patient is aware that a heating pad may be used for comfort as needed.

## 2012-09-26 NOTE — Progress Notes (Signed)
Patient here today for follow up post left breast biopsy.  No dressing or steristrip.  Minimal bruising noted.    Aware of pathology. Follow up as scheduled.

## 2012-10-01 ENCOUNTER — Encounter: Payer: Self-pay | Admitting: General Surgery

## 2012-10-12 ENCOUNTER — Encounter: Payer: Self-pay | Admitting: General Surgery

## 2012-10-24 ENCOUNTER — Telehealth: Payer: Self-pay | Admitting: Internal Medicine

## 2012-10-24 NOTE — Telephone Encounter (Signed)
The labs she has scheduled here are - cholesterol and sugar.  Yes she needs to have these drawn.  Dr Renae Fickle is following her thyroid.

## 2012-10-24 NOTE — Telephone Encounter (Signed)
Pt rescheduled appt and labs to November from October.  States she is to see Dr. Renae Fickle 10/13 and is to have labs drawn there and an ultrasound.  Pt states she will call them to find out why she has to go to Dr. Renae Fickle.  Pt asking Dr. Roby Lofts opinion on whether she needs all this blood work done.

## 2012-10-26 NOTE — Telephone Encounter (Signed)
Pt.notified

## 2012-10-29 ENCOUNTER — Other Ambulatory Visit: Payer: 59

## 2012-11-01 ENCOUNTER — Ambulatory Visit: Payer: 59 | Admitting: Internal Medicine

## 2012-11-27 ENCOUNTER — Other Ambulatory Visit (INDEPENDENT_AMBULATORY_CARE_PROVIDER_SITE_OTHER): Payer: 59

## 2012-11-27 DIAGNOSIS — R7309 Other abnormal glucose: Secondary | ICD-10-CM

## 2012-11-27 DIAGNOSIS — R739 Hyperglycemia, unspecified: Secondary | ICD-10-CM

## 2012-11-27 DIAGNOSIS — E785 Hyperlipidemia, unspecified: Secondary | ICD-10-CM

## 2012-11-27 LAB — LIPID PANEL
Cholesterol: 292 mg/dL — ABNORMAL HIGH (ref 0–200)
Total CHOL/HDL Ratio: 5
Triglycerides: 192 mg/dL — ABNORMAL HIGH (ref 0.0–149.0)

## 2012-11-27 LAB — LDL CHOLESTEROL, DIRECT: Direct LDL: 200.8 mg/dL

## 2012-11-30 ENCOUNTER — Encounter: Payer: Self-pay | Admitting: Internal Medicine

## 2012-11-30 ENCOUNTER — Ambulatory Visit (INDEPENDENT_AMBULATORY_CARE_PROVIDER_SITE_OTHER): Payer: Medicare Other | Admitting: Internal Medicine

## 2012-11-30 VITALS — BP 120/80 | HR 70 | Temp 98.1°F | Ht 64.0 in | Wt 139.2 lb

## 2012-11-30 DIAGNOSIS — R140 Abdominal distension (gaseous): Secondary | ICD-10-CM

## 2012-11-30 DIAGNOSIS — M810 Age-related osteoporosis without current pathological fracture: Secondary | ICD-10-CM

## 2012-11-30 DIAGNOSIS — R928 Other abnormal and inconclusive findings on diagnostic imaging of breast: Secondary | ICD-10-CM

## 2012-11-30 DIAGNOSIS — R739 Hyperglycemia, unspecified: Secondary | ICD-10-CM

## 2012-11-30 DIAGNOSIS — R002 Palpitations: Secondary | ICD-10-CM

## 2012-11-30 DIAGNOSIS — R102 Pelvic and perineal pain: Secondary | ICD-10-CM

## 2012-11-30 DIAGNOSIS — K219 Gastro-esophageal reflux disease without esophagitis: Secondary | ICD-10-CM

## 2012-11-30 DIAGNOSIS — Z23 Encounter for immunization: Secondary | ICD-10-CM

## 2012-11-30 DIAGNOSIS — E785 Hyperlipidemia, unspecified: Secondary | ICD-10-CM

## 2012-11-30 DIAGNOSIS — E049 Nontoxic goiter, unspecified: Secondary | ICD-10-CM

## 2012-11-30 DIAGNOSIS — K2270 Barrett's esophagus without dysplasia: Secondary | ICD-10-CM

## 2012-12-02 ENCOUNTER — Encounter: Payer: Self-pay | Admitting: Internal Medicine

## 2012-12-02 NOTE — Assessment & Plan Note (Signed)
Right thyroid fullness.  Saw endocrinology.  Had biopsy.  States everything checked out fine.  Has f/u next week.

## 2012-12-02 NOTE — Assessment & Plan Note (Signed)
Stable.  States just saw Dr Gwen Pounds.   Had holter.  Reports everything checked out fine.  Follow.

## 2012-12-02 NOTE — Assessment & Plan Note (Signed)
On prevacid bid.  Has known Barretts.  States due colonoscopy.  Needs f/u EGD.  Wants to hold on scopes until next year.  We discussed this at length.  Desires no w/up at this point.

## 2012-12-02 NOTE — Assessment & Plan Note (Signed)
Desires not to take lipitor.  Low cholesterol diet and exercise.  Follow.  Discussed at length with her.

## 2012-12-02 NOTE — Progress Notes (Signed)
  Subjective:    Patient ID: Carrie Noble, female    DOB: [birth year], 72 y.o.   MRN: [id number]  HPI 72 year old female with past history of GERD with Barretts and a hiatal hernia, arthritis, hypercholesterolemia, thyroid goiter and osteoporosis.  She comes in today for a scheduled follow up.  She is staying active.  No cardiac symptoms with increased activity or exertion.  She is back on prevacid.  She is taking prevacid bid.   Reports some abdominal discomfort and lower pelvic discomfort.  Still some bloating.  Taking metamucil and flaxseed.  No diarrhea now.   She also recently saw Dr Gwen Pounds.  Had holter monitor.  No significant arrhythmia.  Breathing stable.  States due a colonoscopy.  Was trying to wait until next year.     Past Medical History  Diagnosis Date  . Hyperlipidemia   . Gastroesophageal reflux   . Hiatal hernia 1989    status post Nissen fundoplication   . Hx of hysterectomy   . Tachycardia     Patient stated that this has been occuring frequently.  . Arthritis   . Allergy   . Thyroid disease     Goiter    Outpatient Encounter Prescriptions as of 11/30/2012  Medication Sig  . Calcium Carbonate (CALCIUM 600 PO) Take by mouth daily.  . Cholecalciferol (VITAMIN D PO) Take by mouth daily.  . lansoprazole (PREVACID) 30 MG capsule Take 1 capsule (30 mg total) by mouth 2 (two) times daily.  . vitamin B-12 (CYANOCOBALAMIN) 1000 MCG tablet Take 1,000 mcg by mouth daily.  Marland Kitchen FLUoxetine (PROZAC) 10 MG capsule Take 1 capsule (10 mg total) by mouth daily.    Review of Systems Patient denies any headache, lightheadedness or dizziness.  No significant sinus or allergy symptoms.  No chest pain, tightness or significant palpitations.  Active with no cardiac symptoms. No increased shortness of breath, cough or congestion.   Does report the stomach discomfort and bloating as outlined.  No significant bowel change, such as diarrhea, BRBPR or melana.  No urine change.  Cholesterol  elevated.   Desires not to take cholesterol medicaton.  Discussed at length with her.  She continues to decline cholesterol medication.   Discussed diet and exercise.       Objective:   Physical Exam  Filed Vitals:   11/30/12 1132  BP: 120/80  Pulse: 70  Temp: 98.1 F (54.38 C)   73 year old female in no acute distress.   HEENT:  Nares- clear.  Oropharynx - without lesions. NECK:  Supple.  Nontender.  No audible bruit.  Right thyroid fullness.  HEART:  Appears to be regular. LUNGS:  No crackles or wheezing audible.  Respirations even and unlabored.  RADIAL PULSE:  Equal bilaterally.   ABDOMEN:  Soft, nontender.  Bowel sounds present and normal.  No audible abdominal bruit.  EXTREMITIES:  No increased edema present.  DP pulses palpable and equal bilaterally.        Assessment & Plan:  BREAST NODULE.  09/06/12 mammogram and ultrasound.  Biopsy negative.    MSK.  Desires no further w/up.  Follow.   ABDOMINAL BLOATING AND LOWER ABDOMINAL PRESSURE.  Wants to hold on scopes.  Abdominal ultrasound revealed fatty liver.  No other acute abnormality.  Will obtain pelvic ultrasound.     HEALTH MAINTENANCE.  Physical 08/01/12.   Mammogram as outlined.  Wants to hold on colonoscopy.

## 2012-12-02 NOTE — Assessment & Plan Note (Signed)
Recent vitamin D level wnl.   

## 2012-12-02 NOTE — Assessment & Plan Note (Signed)
S/p biopsy.  Negative.    

## 2012-12-02 NOTE — Assessment & Plan Note (Signed)
Sees Dr Marva Panda.  Last EGD 03/22/11.  Due a follow up colonoscopy.  Wants to hold off until next year.

## 2012-12-31 DIAGNOSIS — N3020 Other chronic cystitis without hematuria: Secondary | ICD-10-CM | POA: Insufficient documentation

## 2013-01-24 HISTORY — PX: COLONOSCOPY: SHX174

## 2013-01-24 HISTORY — PX: UPPER GI ENDOSCOPY: SHX6162

## 2013-03-15 ENCOUNTER — Ambulatory Visit: Payer: Self-pay | Admitting: Gastroenterology

## 2013-03-15 LAB — HM COLONOSCOPY

## 2013-03-18 LAB — PATHOLOGY REPORT

## 2013-03-27 ENCOUNTER — Ambulatory Visit: Payer: Self-pay | Admitting: General Surgery

## 2013-03-27 ENCOUNTER — Encounter: Payer: Self-pay | Admitting: Internal Medicine

## 2013-03-27 ENCOUNTER — Encounter: Payer: Self-pay | Admitting: General Surgery

## 2013-03-27 DIAGNOSIS — K5790 Diverticulosis of intestine, part unspecified, without perforation or abscess without bleeding: Secondary | ICD-10-CM

## 2013-03-27 DIAGNOSIS — K2270 Barrett's esophagus without dysplasia: Secondary | ICD-10-CM

## 2013-03-28 ENCOUNTER — Telehealth: Payer: Self-pay | Admitting: Internal Medicine

## 2013-03-28 NOTE — Telephone Encounter (Signed)
Pt states she has lab appt 3/6 a.m., asking if she can add some labs to that.  States she has canker sores a lot and would like to test blood to see why.  Also states she has had a cold and has taken a lot of antihistamines this week.  Asking if Dr. Lorin PicketScott could see her when she comes in for her blood work.  Advised there are no appointments available.  Pt states it would only take a couple of minutes.

## 2013-03-28 NOTE — Telephone Encounter (Signed)
Pt coming at 8am tomorrow

## 2013-03-28 NOTE — Telephone Encounter (Signed)
I can see her tomorrow at 8:00.  Let me see her before she does her lab.

## 2013-03-29 ENCOUNTER — Other Ambulatory Visit: Payer: 59

## 2013-03-29 ENCOUNTER — Encounter: Payer: Self-pay | Admitting: Internal Medicine

## 2013-03-29 ENCOUNTER — Ambulatory Visit (INDEPENDENT_AMBULATORY_CARE_PROVIDER_SITE_OTHER): Payer: 59 | Admitting: Internal Medicine

## 2013-03-29 ENCOUNTER — Encounter (INDEPENDENT_AMBULATORY_CARE_PROVIDER_SITE_OTHER): Payer: Self-pay

## 2013-03-29 VITALS — BP 120/80 | HR 66 | Temp 97.8°F | Ht 64.0 in | Wt 144.2 lb

## 2013-03-29 DIAGNOSIS — K219 Gastro-esophageal reflux disease without esophagitis: Secondary | ICD-10-CM

## 2013-03-29 DIAGNOSIS — K5790 Diverticulosis of intestine, part unspecified, without perforation or abscess without bleeding: Secondary | ICD-10-CM

## 2013-03-29 DIAGNOSIS — R739 Hyperglycemia, unspecified: Secondary | ICD-10-CM

## 2013-03-29 DIAGNOSIS — E049 Nontoxic goiter, unspecified: Secondary | ICD-10-CM

## 2013-03-29 DIAGNOSIS — R140 Abdominal distension (gaseous): Secondary | ICD-10-CM

## 2013-03-29 DIAGNOSIS — R143 Flatulence: Secondary | ICD-10-CM

## 2013-03-29 DIAGNOSIS — R142 Eructation: Secondary | ICD-10-CM

## 2013-03-29 DIAGNOSIS — R928 Other abnormal and inconclusive findings on diagnostic imaging of breast: Secondary | ICD-10-CM

## 2013-03-29 DIAGNOSIS — Z8349 Family history of other endocrine, nutritional and metabolic diseases: Secondary | ICD-10-CM

## 2013-03-29 DIAGNOSIS — K5730 Diverticulosis of large intestine without perforation or abscess without bleeding: Secondary | ICD-10-CM

## 2013-03-29 DIAGNOSIS — E785 Hyperlipidemia, unspecified: Secondary | ICD-10-CM

## 2013-03-29 DIAGNOSIS — R141 Gas pain: Secondary | ICD-10-CM

## 2013-03-29 DIAGNOSIS — M810 Age-related osteoporosis without current pathological fracture: Secondary | ICD-10-CM

## 2013-03-29 DIAGNOSIS — K2270 Barrett's esophagus without dysplasia: Secondary | ICD-10-CM

## 2013-03-29 DIAGNOSIS — R7309 Other abnormal glucose: Secondary | ICD-10-CM

## 2013-03-29 LAB — COMPREHENSIVE METABOLIC PANEL
ALBUMIN: 4 g/dL (ref 3.5–5.2)
ALT: 27 U/L (ref 0–35)
AST: 25 U/L (ref 0–37)
Alkaline Phosphatase: 100 U/L (ref 39–117)
BUN: 16 mg/dL (ref 6–23)
CO2: 31 mEq/L (ref 19–32)
Calcium: 9.7 mg/dL (ref 8.4–10.5)
Chloride: 106 mEq/L (ref 96–112)
Creatinine, Ser: 0.9 mg/dL (ref 0.4–1.2)
GFR: 67.67 mL/min (ref >60.00)
Glucose, Bld: 89 mg/dL (ref 70–99)
POTASSIUM: 4.9 meq/L (ref 3.5–5.1)
SODIUM: 142 meq/L (ref 135–145)
TOTAL PROTEIN: 6.9 g/dL (ref 6.0–8.3)
Total Bilirubin: 0.8 mg/dL (ref 0.3–1.2)

## 2013-03-29 LAB — LIPID PANEL
Cholesterol: 262 mg/dL — ABNORMAL HIGH (ref 0–200)
HDL: 52.4 mg/dL (ref >39.00)
LDL CALC: 185 mg/dL — AB (ref 0–99)
TRIGLYCERIDES: 121 mg/dL (ref 0.0–149.0)
Total CHOL/HDL Ratio: 5
VLDL: 24.2 mg/dL (ref 0.0–40.0)

## 2013-03-29 LAB — CBC WITH DIFFERENTIAL/PLATELET
Basophils Absolute: 0 10*3/uL (ref 0.0–0.1)
Basophils Relative: 0.6 % (ref 0.0–3.0)
EOS ABS: 0.2 10*3/uL (ref 0.0–0.7)
Eosinophils Relative: 3.4 % (ref 0.0–5.0)
HCT: 38.9 % (ref 36.0–46.0)
Hemoglobin: 13.2 g/dL (ref 12.0–15.0)
Lymphocytes Relative: 40.8 % (ref 12.0–46.0)
Lymphs Abs: 2.1 10*3/uL (ref 0.7–4.0)
MCHC: 33.8 g/dL (ref 30.0–36.0)
MCV: 92.7 fl (ref 78.0–100.0)
MONO ABS: 0.4 10*3/uL (ref 0.1–1.0)
Monocytes Relative: 7.8 % (ref 3.0–12.0)
NEUTROS PCT: 47.4 % (ref 43.0–77.0)
Neutro Abs: 2.5 10*3/uL (ref 1.4–7.7)
PLATELETS: 322 10*3/uL (ref 150.0–400.0)
RBC: 4.2 Mil/uL (ref 3.87–5.11)
RDW: 13.2 % (ref 11.5–14.6)
WBC: 5.2 10*3/uL (ref 4.5–10.5)

## 2013-03-29 LAB — IBC PANEL
Iron: 100 ug/dL (ref 42–145)
SATURATION RATIOS: 28.5 % (ref 20.0–50.0)
TRANSFERRIN: 250.7 mg/dL (ref 212.0–360.0)

## 2013-03-29 LAB — FERRITIN: FERRITIN: 111.7 ng/mL (ref 10.0–291.0)

## 2013-03-29 LAB — VITAMIN B12: VITAMIN B 12: 896 pg/mL (ref 211–911)

## 2013-03-29 LAB — HEMOGLOBIN A1C: HEMOGLOBIN A1C: 5.7 % (ref 4.6–6.5)

## 2013-03-29 NOTE — Telephone Encounter (Signed)
She does not need to keep the Monday appt.  Block spot, I have someone I want to work in.  Thanks.

## 2013-03-29 NOTE — Progress Notes (Signed)
Pre-visit discussion using our clinic review tool. No additional management support is needed unless otherwise documented below in the visit note.  

## 2013-03-29 NOTE — Telephone Encounter (Signed)
Pt notified- appt cancelled

## 2013-03-29 NOTE — Progress Notes (Signed)
Subjective:    Patient ID: Carrie Noble, female    DOB: July 22, 1940, 73 y.o.   MRN: [id number]  Mouth Lesions  Associated symptoms include mouth sores.  73 year old female with past history of GERD with Barretts and a hiatal hernia, arthritis, hypercholesterolemia, thyroid goiter and osteoporosis.  She comes in today as a work in with concerns regarding some persistent issues with her mouth and acid reflux.   She is staying active.  No cardiac symptoms with increased activity or exertion.  She also recently saw Dr Gwen Pounds.  Had holter monitor.  No significant arrhythmia.  Breathing stable.  Recently saw ENT.  Had laryngoscopy x 2 - negative.  Was instructed by ENT to increase omeprazole to 40mg  q day.  Saw GI.  Had EGD and colonoscopy.  Was instructed to take aciphex and probiotics.  Still with persistent drainage and throat congestion.  Planning to see an allergist.  Some sores in her mouth.  No chest congestion.  Did report a family history of hemochromatosis.       Past Medical History  Diagnosis Date  . Hyperlipidemia   . Gastroesophageal reflux   . Hiatal hernia 1989    status post Nissen fundoplication   . Hx of hysterectomy   . Tachycardia     Patient stated that this has been occuring frequently.  . Arthritis   . Allergy   . Thyroid disease     Goiter    Outpatient Encounter Prescriptions as of 03/29/2013  Medication Sig  . Biotin 1000 MCG tablet Take 1,000 mcg by mouth 3 (three) times daily.  . Calcium Carbonate (CALCIUM 600 PO) Take by mouth daily.  . Cholecalciferol (VITAMIN D PO) Take by mouth daily.  Marland Kitchen FLUoxetine (PROZAC) 10 MG capsule Take 1 capsule (10 mg total) by mouth daily.  . lansoprazole (PREVACID) 30 MG capsule Take 1 capsule (30 mg total) by mouth 2 (two) times daily.  . Probiotic Product (PROBIOTIC DAILY PO) Take by mouth.  . RABEprazole (ACIPHEX) 20 MG tablet Take 20 mg by mouth daily.  . vitamin B-12 (CYANOCOBALAMIN) 1000 MCG tablet Take 1,000 mcg by mouth  daily.    Review of Systems  HENT: Positive for mouth sores.   Patient denies any headache, lightheadedness or dizziness.  No significant sinus or allergy symptoms.  Some throat congestion as outlined.  No chest pain, tightness or significant palpitations.  Active with no cardiac symptoms. No increased shortness of breath.  No chest congestion.   No significant bowel change, such as diarrhea, BRBPR or melana.  No urine change.  Cholesterol elevated.   Desires not to take cholesterol medicaton.  Have discussed with her.  She continues to decline cholesterol medication.   Have discussed diet and exercise.       Objective:   Physical Exam  Filed Vitals:   03/29/13 0758  BP: 120/80  Pulse: 66  Temp: 97.8 F (36.85 C)   73 year old female in no acute distress.   HEENT:  Nares- clear.  Oropharynx - without lesions. NECK:  Supple.  Nontender.  No audible bruit.  Right thyroid fullness.  HEART:  Appears to be regular. LUNGS:  No crackles or wheezing audible.  Respirations even and unlabored.  RADIAL PULSE:  Equal bilaterally.   ABDOMEN:  Soft, nontender.  Bowel sounds present and normal.  No audible abdominal bruit.  EXTREMITIES:  No increased edema present.  DP pulses palpable and equal bilaterally.  Assessment & Plan:  BREAST NODULE.  09/06/12 mammogram and ultrasound.  Biopsy negative.    MSK.  Desires no further w/up.  Follow.   ABDOMINAL BLOATING AND LOWER ABDOMINAL PRESSURE.  Not reported as an issue today.  Just saw GI.  Had colonoscopy and EGD.     HEALTH MAINTENANCE.  Physical 08/01/12.   Mammogram as outlined.  Colonoscopy as outlined.

## 2013-03-29 NOTE — Telephone Encounter (Signed)
Patient was seen this morning she wants to know if she needs to keep her appointment for Monday also. Please advise.

## 2013-03-31 ENCOUNTER — Encounter: Payer: Self-pay | Admitting: Internal Medicine

## 2013-03-31 LAB — FOLATE RBC: RBC Folate: 716 ng/mL (ref >280)

## 2013-03-31 NOTE — Assessment & Plan Note (Signed)
Sees Dr Marva PandaSkulskie. Just had EGD 02/2013 as outlined.  On aciphex.  Await pathology results.  Continue f/u with GI.

## 2013-03-31 NOTE — Assessment & Plan Note (Signed)
Colonoscopy as outlined. Currently asymptomatic.     

## 2013-03-31 NOTE — Assessment & Plan Note (Signed)
Right thyroid fullness.  Saw endocrinology.  Had biopsy.  States everything checked out fine.  Continues to f/u with endocrinology.    

## 2013-03-31 NOTE — Assessment & Plan Note (Signed)
Recent vitamin D level wnl.  Continue weight bearing exercise.

## 2013-03-31 NOTE — Assessment & Plan Note (Signed)
S/p biopsy.  Negative.

## 2013-03-31 NOTE — Assessment & Plan Note (Addendum)
Desires not to take lipitor.  Low cholesterol diet and exercise.  Follow.     

## 2013-03-31 NOTE — Assessment & Plan Note (Signed)
Has known Barretts.  Just saw GI.  Had EGD as outlined.  On aciphex.  Follow.

## 2013-04-01 ENCOUNTER — Ambulatory Visit: Payer: 59 | Admitting: Internal Medicine

## 2013-04-03 ENCOUNTER — Ambulatory Visit: Payer: 59 | Admitting: General Surgery

## 2013-04-03 LAB — HEMOCHROMATOSIS DNA-PCR(C282Y,H63D)

## 2013-04-09 ENCOUNTER — Encounter: Payer: Self-pay | Admitting: General Surgery

## 2013-04-09 ENCOUNTER — Ambulatory Visit (INDEPENDENT_AMBULATORY_CARE_PROVIDER_SITE_OTHER): Payer: 59 | Admitting: General Surgery

## 2013-04-09 VITALS — BP 130/70 | HR 74 | Resp 12 | Ht 64.0 in | Wt 140.0 lb

## 2013-04-09 DIAGNOSIS — N63 Unspecified lump in unspecified breast: Secondary | ICD-10-CM

## 2013-04-09 DIAGNOSIS — N62 Hypertrophy of breast: Secondary | ICD-10-CM

## 2013-04-09 DIAGNOSIS — N6489 Other specified disorders of breast: Secondary | ICD-10-CM

## 2013-04-09 NOTE — Patient Instructions (Signed)
Patient to return as needed. 

## 2013-04-09 NOTE — Progress Notes (Signed)
Patient ID: Carrie Noble, female   DOB: Oct 05, 1940, 73 y.o.   MRN: [id number]  Chief Complaint  Patient presents with  . Follow-up    mammogram    HPI Carrie Noble is a 73 y.o. female who presents for a breast evaluation. The most recent left breast mammogram was done on 03/27/13. Patient does perform regular self breast checks and gets regular mammograms done.  Patient denise any new breast problems.  HPI  Past Medical History  Diagnosis Date  . Hyperlipidemia   . Gastroesophageal reflux   . Hiatal hernia 1989    status post Nissen fundoplication   . Hx of hysterectomy   . Tachycardia     Patient stated that this has been occuring frequently.  . Arthritis   . Allergy   . Thyroid disease     Goiter    Past Surgical History  Procedure Laterality Date  . Tonsillectomy      as well as goiter  . Laparoscopic nissen fundoplication  1999  . Abdominal hysterectomy  1980    partial  . Esophagogastric fundoplication  1999  . Breast biopsy Right   . Breast biopsy Left   . Colonoscopy  2015  . Upper gi endoscopy  2015    Family History  Problem Relation Age of Onset  . Stroke Mother [id number]  . Arthritis Mother   . Heart disease Mother   . Hypertension Mother   . Stroke Father [id number]  . Hypertension Father   . Rheumatic fever Brother     and multiple open heart surgeries  . Diabetes Brother     type 2  . Stroke Brother   . Other Sister     Coronary Atherosclerosis  . Stroke Brother   . Stroke Sister   . Heart disease Sister   . Dementia Sister   . Cancer Sister     ovarian    Social History History  Substance Use Topics  . Smoking status: Never Smoker   . Smokeless tobacco: Never Used  . Alcohol Use: No    Allergies  Allergen Reactions  . Darvocet [Propoxyphene N-Acetaminophen]     Patient stated that medication made her heart beat fast.  . Morphine And Related     Patient stated that medication made her heart beat fast.    Current Outpatient Prescriptions   Medication Sig Dispense Refill  . Biotin 1000 MCG tablet Take 1,000 mcg by mouth 3 (three) times daily.      . Calcium Carbonate (CALCIUM 600 PO) Take by mouth daily.      . Cholecalciferol (VITAMIN D PO) Take by mouth daily.      Marland Kitchen. FLUoxetine (PROZAC) 10 MG capsule Take 1 capsule (10 mg total) by mouth daily.  30 capsule  2  . lansoprazole (PREVACID) 30 MG capsule Take 1 capsule (30 mg total) by mouth 2 (two) times daily.  60 capsule  3  . Probiotic Product (PROBIOTIC DAILY PO) Take by mouth.      . RABEprazole (ACIPHEX) 20 MG tablet Take 20 mg by mouth daily.      . vitamin B-12 (CYANOCOBALAMIN) 1000 MCG tablet Take 1,000 mcg by mouth daily.       No current facility-administered medications for this visit.    Review of Systems Review of Systems  Constitutional: Negative.   Respiratory: Negative.   Cardiovascular: Negative.     Blood pressure 130/70, pulse 74, resp. rate 12, height 5\' 4"  (1.626 m), weight 140 lb (  63.504 kg).  Physical Exam Physical Exam  Constitutional: She is oriented to person, place, and time. She appears well-developed and well-nourished.  Eyes: Conjunctivae are normal.  Neck: Neck supple.  Cardiovascular: Normal rate, regular rhythm and normal heart sounds.   Pulmonary/Chest: Effort normal and breath sounds normal. Right breast exhibits no inverted nipple, no mass, no nipple discharge, no skin change and no tenderness. Left breast exhibits no inverted nipple, no mass, no nipple discharge, no skin change and no tenderness. Breasts are asymmetrical (left breast 1/2 cup size bigger than right  ).  Lymphadenopathy:    She has no cervical adenopathy.    She has no axillary adenopathy.  Neurological: She is alert and oriented to person, place, and time.  Skin: Skin is warm and dry.    Data Reviewed 7 month post biopsy mammogram dated March 27, 2013 showed no mammographic abnormality. BI-RAD-0 as the radiologist one and a repeat ultrasound of the previous biopsy  site.  On her August 2014 mammograms a ribbon shaped marker is identified in the upper quadrant of the left breast. On her March 2015 mammograms a "S" shaped marker is evident in the same area.  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  Pathology from the November 10, 2008 biopsy showed evidence a fibroadenoma.   Assessment    Benign breast exam.    A Plan    I don't see any indication for repeat ultrasound examination. The patient is totally benign biopsy and adequate sampling at that time. She should resume annual screening mammograms with her primary care physician. These were be due in fall 2015.        Carrie Noble 04/10/2013, 6:15 AM

## 2013-04-19 ENCOUNTER — Encounter: Payer: Self-pay | Admitting: Internal Medicine

## 2013-05-05 ENCOUNTER — Telehealth: Payer: Self-pay | Admitting: Internal Medicine

## 2013-05-05 DIAGNOSIS — Z148 Genetic carrier of other disease: Secondary | ICD-10-CM

## 2013-05-05 NOTE — Telephone Encounter (Signed)
Pt notified of lab results.  Agreeable to hematology referral.  Order placed for referral.

## 2013-05-23 ENCOUNTER — Telehealth: Payer: Self-pay | Admitting: Internal Medicine

## 2013-05-23 NOTE — Telephone Encounter (Signed)
[facility] called to inform that the pt did not show for her appointment.

## 2013-05-23 NOTE — Telephone Encounter (Signed)
FYI

## 2013-05-24 NOTE — Telephone Encounter (Signed)
Spoke with patient & she states that she cancelled that appt & told them that she would reschedule at a later date. I gave pt the phone number to the cancer center for her to reschedule that appt & explain to them that she called & cancelled prior to that appt.

## 2013-05-24 NOTE — Telephone Encounter (Signed)
Please notify pt that she missed her appt at the cancer center and needs to reschedule.  appt to discuss hemochromatosis.

## 2013-09-12 ENCOUNTER — Telehealth: Payer: Self-pay | Admitting: Internal Medicine

## 2013-09-12 NOTE — Telephone Encounter (Signed)
LMTCB need to schedule a wellness visit

## 2013-09-19 ENCOUNTER — Ambulatory Visit (INDEPENDENT_AMBULATORY_CARE_PROVIDER_SITE_OTHER): Payer: Medicare Other | Admitting: Adult Health

## 2013-09-19 ENCOUNTER — Encounter: Payer: Self-pay | Admitting: Adult Health

## 2013-09-19 DIAGNOSIS — Z0000 Encounter for general adult medical examination without abnormal findings: Secondary | ICD-10-CM

## 2013-09-19 DIAGNOSIS — Z23 Encounter for immunization: Secondary | ICD-10-CM

## 2013-09-19 NOTE — Progress Notes (Signed)
Subjective:    Carrie Noble is a 73 y.o. female who presents for Medicare Annual/Subsequent preventive examination.  Preventive Screening-Counseling & Management  Tobacco History  Smoking status  . Never Smoker   Smokeless tobacco  . Never Used     Problems Prior to Visit 1.   Current Problems (verified) Patient Active Problem List   Diagnosis Date Noted  . Diverticulosis 03/27/2013  . 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 Abnormal mammogram 09/06/2012  . Osteoporosis 05/29/2012  . Thyroid goiter 05/28/2012  . Barrett's esophagus 05/28/2012  . Hyperlipidemia 01/03/2011  . Palpitations 01/03/2011  . GERD (gastroesophageal reflux disease) 01/03/2011    Medications Prior to Visit Current Outpatient Prescriptions on File Prior to Visit  Medication Sig Dispense Refill  . Biotin 1000 MCG tablet Take 1,000 mcg by mouth 3 (three) times daily.      . Calcium Carbonate (CALCIUM 600 PO) Take by mouth daily.      . Cholecalciferol (VITAMIN D PO) Take by mouth daily.      . Probiotic Product (PROBIOTIC DAILY PO) Take by mouth.      . RABEprazole (ACIPHEX) 20 MG tablet Take 20 mg by mouth daily.      . vitamin B-12 (CYANOCOBALAMIN) 1000 MCG tablet Take 1,000 mcg by mouth daily.       No current facility-administered medications on file prior to visit.    Current Medications (verified) Current Outpatient Prescriptions  Medication Sig Dispense Refill  . Biotin 1000 MCG tablet Take 1,000 mcg by mouth 3 (three) times daily.      . Calcium Carbonate (CALCIUM 600 PO) Take by mouth daily.      . Cholecalciferol (VITAMIN D PO) Take by mouth daily.      . Probiotic Product (PROBIOTIC DAILY PO) Take by mouth.      . RABEprazole (ACIPHEX) 20 MG tablet Take 20 mg by mouth daily.      . vitamin B-12 (CYANOCOBALAMIN) 1000 MCG tablet Take 1,000 mcg by mouth daily.       No current facility-administered medications for this visit.     Allergies  (verified) Darvocet and Morphine and related   PAST HISTORY  Family History Family History  Problem Relation Age of Onset  . Stroke Mother 65  . Arthritis Mother   . Heart disease Mother   . Hypertension Mother   . Stroke Father 70  . Hypertension Father   . Rheumatic fever Brother     and multiple open heart surgeries  . Diabetes Brother     type 2  . Stroke Brother   . Other Sister     Coronary Atherosclerosis  . Stroke Brother   . Stroke Sister   . Heart disease Sister   . Dementia Sister   . Cancer Sister     ovarian    Social History History  Substance Use Topics  . Smoking status: Never Smoker   . Smokeless tobacco: Never Used  . Alcohol Use: No     Are there smokers in your home (other than you)? No  Risk Factors Current exercise habits: Home exercise routine includes walking .5 hrs per day.  Dietary issues discussed: Does not follow healthy diet. Eats fried foods. Little roughage.   Cardiac risk factors: advanced age (older than 53 for men, 35 for women) and dyslipidemia.  Depression Screen (Note: if answer to either of the following is "Yes", a more complete depression screening is indicated)   Over the past two  weeks, have you felt down, depressed or hopeless? No  Over the past two weeks, have you felt little interest or pleasure in doing things? No  Have you lost interest or pleasure in daily life? No  Do you often feel hopeless? No  Do you cry easily over simple problems? No  Activities of Daily Living In your present state of health, do you have any difficulty performing the following activities?:  Driving? No Managing money?  No Feeding yourself? No Getting from bed to chair? NoNo exam performed today, Medicare. Climbing a flight of stairs? No Preparing food and eating?: No Bathing or showering? No Getting dressed: No Getting to the toilet? No Using the toilet:No Moving around from place to place: No In the past year have you fallen or  had a near fall?:No   Are you sexually active?  No  Do you have more than one partner?  No  Hearing Difficulties: Yes. Only in left ear and with background noise. Do you often ask people to speak up or repeat themselves? No Do you experience ringing or noises in your ears? No Do you have difficulty understanding soft or whispered voices? No   Do you feel that you have a problem with memory? No  Do you often misplace items? No  Do you feel safe at home?  Yes  Cognitive Testing  Alert? Yes  Normal Appearance?Yes  Oriented to person? Yes  Place? Yes   Time? Yes  Recall of three objects?  Yes  Can perform simple calculations? Yes  Displays appropriate judgment?Yes  Can read the correct time from a watch face?Yes   Advanced Directives have been discussed with the patient? Yes  List the Names of Other Physician/Practitioners you currently use: 1.  Dr. Ricki Rodriguez - GI 2.  Dr. Alexia Freestone - Optometrist 3.  Dr. Beverley Fiedler  Indicate any recent Medical Services you may have received from other than [facility] providers in the past year (date may be approximate).  Immunization History  Administered Date(s) Administered  . Influenza,inj,Quad PF,36+ Mos 11/30/2012    Screening Tests Health Maintenance  Topic Date Due  . Tetanus/tdap  05/12/1958  . Zostavax  05/12/1999  . Pneumococcal Polysaccharide Vaccine Age 7 And Over  05/11/2004  . Influenza Vaccine  08/24/2013  . Mammogram  03/28/2015  . Colonoscopy  03/16/2023    All answers were reviewed with the patient and necessary referrals were made:  Blessing Zaucha, NP   09/19/2013   History reviewed: allergies, current medications, past family history, past medical history, past social history, past surgical history and problem list  Review of Systems No ROS - Medicare Wellness    Objective:     Vision by Snellen chart: right NWG:NFAOZHY declines measurement, left QMV:HQIONGE declines measurement  There is no weight on file to calculate  BMI. There were no vitals taken for this visit.  No exam performed today, Medicare Wellness.     Assessment:      This is a routine wellness  examination for this patient . I reviewed all health maintenance protocols including mammography, colonoscopy, bone density. Needed referrals were placed. Age and diagnosis  appropriate screening labs were ordered. Her immunization history was reviewed and appropriate vaccinations were ordered. Her current medications and allergies were reviewed and needed refills of her chronic medications were ordered. The plan for yearly health maintenance was discussed all orders and referrals were made as appropriate.        Plan:     During the course  of the visit the patient was educated and counseled about appropriate screening and preventive services including:    Influenza vaccine  Td vaccine  Advanced directives: has NO advanced directive - not interested in additional information   Diet review for nutrition referral? Yes ____  Not Indicated __x__   Patient Instructions (the written plan) was given to the patient.  Medicare Attestation I have personally reviewed: The patient's medical and social history Their use of alcohol, tobacco or illicit drugs Their current medications and supplements The patient's functional ability including ADLs,fall risks, home safety risks, cognitive, and hearing and visual impairment Diet and physical activities Evidence for depression or mood disorders  The patient's weight, height, BMI, and visual acuity have been recorded in the chart.  I have made referrals, counseling, and provided education to the patient based on review of the above and I have provided the patient with a written personalized care plan for preventive services.     Izrael Peak, NP   09/19/2013

## 2013-09-19 NOTE — Progress Notes (Signed)
Pre visit review using our clinic review tool, if applicable. No additional management support is needed unless otherwise documented below in the visit note. 

## 2013-09-19 NOTE — Addendum Note (Signed)
Addended by: Melene Plan on: 09/19/2013 12:02 PM   Modules accepted: Orders

## 2013-09-19 NOTE — Patient Instructions (Signed)
  You had your Medicare Wellness screening today  You received the following vaccinations today:   Tetanus - this is good for 10 years   Flu  Please follow up with Dr. Lorin Picket in 3 months or sooner if necessary.

## 2013-10-21 ENCOUNTER — Telehealth: Payer: Self-pay | Admitting: Internal Medicine

## 2013-10-21 DIAGNOSIS — K219 Gastro-esophageal reflux disease without esophagitis: Secondary | ICD-10-CM

## 2013-10-21 NOTE — Telephone Encounter (Signed)
I am ok to refer, but if having chest pain, nausea - needs evaluation.  If acute symptoms, recommend acute care today.

## 2013-10-21 NOTE — Telephone Encounter (Signed)
Please advise 

## 2013-10-21 NOTE — Telephone Encounter (Signed)
Spoke to pt.  She reports acid and food coming up into her chest.  Persistent symptoms.  Request referral to a different gastroenterologist.  Instructed to take prilosec  q day (30 minutes before breakfast) and zantac  -  (30 minutes before her evening meal).  Order for referral placed.

## 2013-10-21 NOTE — Telephone Encounter (Signed)
The patient is having pain on each side of her chest ,IBS and nausea . She is requesting a referral to GI.

## 2013-10-21 NOTE — Telephone Encounter (Signed)
Pt states it nothing unusual, its just indigestion. Doesn't feel the need to be seen urgently

## 2013-10-24 ENCOUNTER — Telehealth: Payer: Self-pay | Admitting: Internal Medicine

## 2013-10-24 NOTE — Telephone Encounter (Signed)
Carrie Noble- from [facility] called in reference to referral staed they need more records Endoscopies performed and colonoscopies.

## 2013-11-07 ENCOUNTER — Encounter: Payer: Self-pay | Admitting: Internal Medicine

## 2013-11-07 ENCOUNTER — Ambulatory Visit (INDEPENDENT_AMBULATORY_CARE_PROVIDER_SITE_OTHER): Payer: Medicare Other | Admitting: Internal Medicine

## 2013-11-07 VITALS — BP 110/70 | HR 74 | Temp 97.8°F | Ht 65.0 in | Wt 140.0 lb

## 2013-11-07 DIAGNOSIS — K5730 Diverticulosis of large intestine without perforation or abscess without bleeding: Secondary | ICD-10-CM

## 2013-11-07 DIAGNOSIS — E049 Nontoxic goiter, unspecified: Secondary | ICD-10-CM

## 2013-11-07 DIAGNOSIS — R928 Other abnormal and inconclusive findings on diagnostic imaging of breast: Secondary | ICD-10-CM

## 2013-11-07 DIAGNOSIS — E785 Hyperlipidemia, unspecified: Secondary | ICD-10-CM

## 2013-11-07 DIAGNOSIS — K2270 Barrett's esophagus without dysplasia: Secondary | ICD-10-CM

## 2013-11-07 DIAGNOSIS — M810 Age-related osteoporosis without current pathological fracture: Secondary | ICD-10-CM

## 2013-11-07 DIAGNOSIS — R002 Palpitations: Secondary | ICD-10-CM

## 2013-11-07 LAB — CBC WITH DIFFERENTIAL/PLATELET
Basophils Absolute: 0 10*3/uL (ref 0.0–0.1)
Basophils Relative: 0.7 % (ref 0.0–3.0)
EOS PCT: 2.5 % (ref 0.0–5.0)
Eosinophils Absolute: 0.1 10*3/uL (ref 0.0–0.7)
HCT: 38.3 % (ref 36.0–46.0)
Hemoglobin: 12.8 g/dL (ref 12.0–15.0)
LYMPHS ABS: 2 10*3/uL (ref 0.7–4.0)
Lymphocytes Relative: 36.7 % (ref 12.0–46.0)
MCHC: 33.5 g/dL (ref 30.0–36.0)
MCV: 94 fl (ref 78.0–100.0)
MONO ABS: 0.4 10*3/uL (ref 0.1–1.0)
MONOS PCT: 7.2 % (ref 3.0–12.0)
NEUTROS ABS: 2.9 10*3/uL (ref 1.4–7.7)
Neutrophils Relative %: 52.9 % (ref 43.0–77.0)
Platelets: 339 10*3/uL (ref 150.0–400.0)
RBC: 4.07 Mil/uL (ref 3.87–5.11)
RDW: 13 % (ref 11.5–15.5)
WBC: 5.4 10*3/uL (ref 4.0–10.5)

## 2013-11-07 LAB — BASIC METABOLIC PANEL
BUN: 16 mg/dL (ref 6–23)
CHLORIDE: 105 meq/L (ref 96–112)
CO2: 25 mEq/L (ref 19–32)
CREATININE: 0.8 mg/dL (ref 0.4–1.2)
Calcium: 9.6 mg/dL (ref 8.4–10.5)
GFR: 74.42 mL/min (ref >60.00)
Glucose, Bld: 87 mg/dL (ref 70–99)
Potassium: 4.3 mEq/L (ref 3.5–5.1)
Sodium: 139 mEq/L (ref 135–145)

## 2013-11-07 LAB — HEPATIC FUNCTION PANEL
ALT: 21 U/L (ref 0–35)
AST: 25 U/L (ref 0–37)
Albumin: 3.8 g/dL (ref 3.5–5.2)
Alkaline Phosphatase: 113 U/L (ref 39–117)
BILIRUBIN TOTAL: 0.7 mg/dL (ref 0.2–1.2)
Bilirubin, Direct: 0 mg/dL (ref 0.0–0.3)
Total Protein: 7.9 g/dL (ref 6.0–8.3)

## 2013-11-07 LAB — LIPID PANEL
Cholesterol: 239 mg/dL — ABNORMAL HIGH (ref 0–200)
HDL: 38.1 mg/dL — ABNORMAL LOW (ref >39.00)
NONHDL: 200.9
Total CHOL/HDL Ratio: 6
Triglycerides: 225 mg/dL — ABNORMAL HIGH (ref 0.0–149.0)
VLDL: 45 mg/dL — AB (ref 0.0–40.0)

## 2013-11-07 LAB — VITAMIN D 25 HYDROXY (VIT D DEFICIENCY, FRACTURES): VITD: 27.85 ng/mL — ABNORMAL LOW (ref 30.00–100.00)

## 2013-11-07 LAB — TSH: TSH: 1.05 u[IU]/mL (ref 0.35–4.50)

## 2013-11-07 LAB — LDL CHOLESTEROL, DIRECT: LDL DIRECT: 152.2 mg/dL

## 2013-11-07 NOTE — Progress Notes (Signed)
Pre visit review using our clinic review tool, if applicable. No additional management support is needed unless otherwise documented below in the visit note. 

## 2013-11-12 ENCOUNTER — Ambulatory Visit (INDEPENDENT_AMBULATORY_CARE_PROVIDER_SITE_OTHER): Payer: 59

## 2013-11-12 ENCOUNTER — Encounter: Payer: Self-pay | Admitting: Podiatry

## 2013-11-12 ENCOUNTER — Ambulatory Visit (INDEPENDENT_AMBULATORY_CARE_PROVIDER_SITE_OTHER): Payer: 59 | Admitting: Podiatry

## 2013-11-12 VITALS — BP 127/77 | HR 69 | Resp 16 | Ht 64.0 in | Wt 140.0 lb

## 2013-11-12 DIAGNOSIS — S92302A Fracture of unspecified metatarsal bone(s), left foot, initial encounter for closed fracture: Secondary | ICD-10-CM

## 2013-11-12 DIAGNOSIS — S93402A Sprain of unspecified ligament of left ankle, initial encounter: Secondary | ICD-10-CM

## 2013-11-12 DIAGNOSIS — S92152A Displaced avulsion fracture (chip fracture) of left talus, initial encounter for closed fracture: Secondary | ICD-10-CM

## 2013-11-12 DIAGNOSIS — M779 Enthesopathy, unspecified: Secondary | ICD-10-CM

## 2013-11-12 NOTE — Progress Notes (Signed)
   Subjective:    Patient ID: Carrie Noble, female    DOB: November 08, 1940, 73 y.o.   MRN: [id number]  HPI Comments: [name], 73 year old female, presents to the office today with complaints of left foot and ankle pain. She states that yesterday afternoon she tripped over a concrete curb and twisted her foot and ankle. She had immediate onset of pain. She subsequently noticed swelling to the foot/ankle and brusing on the outside aspect of the foot/ankle. She has been applying an ACE bandage, soaking in warm epsom salts, as well as cool water. She states that since wearing the ACE bandage, the swelling has improved.  Denies any other injuries at the time of the accident.   Patient also has secondary complaints of right 5th toe ongoing discomfort which has been present for many years. He states that approximately 20 years ago she think she broke her fifth toe on her right foot. She has had ongoing intermittent discomfort over the outside aspect of her foot. She denies any recent injury or trauma to the area. Able to ambulate without difficulty. No other complaints at this time.  Foot Pain      Review of Systems  Gastrointestinal:       Bloating   Musculoskeletal:       Joint pain Difficulty walking   Skin:       Change in nails  All other systems reviewed and are negative.      Objective:   Physical Exam Objective: AAO x3, NAD DP/PT pulses palpable bilaterally, CRT less than 3 seconds Protective sensation intact with Simms Weinstein monofilament, vibratory sensation intact, Achilles tendon reflex intact Left foot and ankle: There is moderate edema noted. There is ecchymosis over the lateral aspect of the foot and ankle mostly on the anterior aspect posteriorly. There is tenderness to palpation over the ATFL. No pain along the CFL or PT FL. Mild discomfort along the distal fibula. No pain along the medial malleolus, syndesmosis, proximal tib-fib. Tenderness to palpation over the fifth  metatarsal base as well as the dorsal medial midfoot. There is pain with vibration to the fifth metatarsal base. No open lesions. Right foot: There is no pinpoint bony tenderness along the fifth digit, fifth metatarsal or other areas of the foot. No pain with vibratory sensation. No pain with range of motion. No open lesions. MMT, ROM decreased on the left secondary to injury. WNL on the right.  No calf pain, swelling, warmth.        Assessment & Plan:  73 year old female with left likely ATFL sprain, fifth metatarsal base fracture, talar navicular joint avulsion fracture; right foot arthritis -X-Rays were obtained and reviewed the patient. -Conservative versus surgical treatment discussed including alternatives, risks, complications. -At this time recommend immobilization in a CAM boot. Patient states that she has one at home and she does not want anyone today as she will likely be billed. I discussed with her to wear this boot. Also, if she gets home and the boot did not fit or was worn out she needs to come by the office to get a new one. Signs and symptoms of DVT, and other complications of immobilization, were discussed with the patient. If any are to occur they were directed to go the emergency room. -Ice to the affected area. -If symptoms persist will consider advanced imaging. -Followup in 3 weeks. At next appointment will re-x-ray. Followup sooner any questions, concerns, change in symptoms.

## 2013-11-12 NOTE — Patient Instructions (Signed)
Fracture A fracture is a break in a bone, due to a force on the bone that is greater than the bone's strength can handle. There are many types of fractures, including:  Complete fracture: The break passes completely through the bone.  Displaced: The ends of the bone fragments are not properly aligned.  Non-displaced: The ends of the bone fragments are in proper alignment.  Incomplete fracture (greenstick): The break does not pass completely through the bone. Incomplete fractures may or may not be angular (angulated).  Open fracture (compound): Part of the broken bone pokes through the skin. Open fractures have a high risk for infection.  Closed fracture: The fracture has not broken through the skin.  Comminuted fracture: The bone is broken into more than two pieces.  Compression fracture: The break occurs from extreme pressure on the bone (includes crushing injury).  Impacted fracture: The broken bone ends have been driven into each other.  Avulsion fracture: A ligament or tendon pulls a small piece of bone off from the main bony segment.  Pathologic fracture: A fracture due to the bone being made weak by a disease (osteoporosis or tumors).  Stress fracture: A fracture caused by intense exercise or repetitive and prolonged pressure that makes the bone weak. SYMPTOMS   Pain, tenderness, bleeding, bruising, and swelling at the fracture site.  Weakness and inability to bear weight on the injured extremity.  Paleness and deformity (sometimes).  Loss of pulse, numbness, tingling, or paralysis below the fracture site (usually a limb); these are emergencies. CAUSES  Bone being subjected to a force greater than its strength. RISK INCREASES WITH:  Contact sports and falls from heights.  Previous or current bone problems (osteoporosis or tumors).  Poor balance.  Poor strength and flexibility. PREVENTION   Warm up and stretch properly before activity.  Maintain physical  fitness:  Cardiovascular fitness.  Muscle strength.  Flexibility and endurance.  Wear proper protective equipment.  Use proper exercise technique. RELATED COMPLICATIONS   Bone fails to heal (nonunion).  Bone heals in a poor position (malunion).  Low blood volume (hypovolemic), shock due to blood loss.  Clump of fat cells travels through the blood (fat embolus) from the injury site to the lungs or brain (more common with thigh fractures).  Obstruction of nearby arteries. TREATMENT  Treatment first requires realigning of the bones (reduction) by a medically trained person, if the fracture is displaced. After realignment if the fracture is completed, or for non-displaced fractures, ice and medicine are used to reduce pain and inflammation. The bone and adjacent joints are then restrained with a splint, cast, or brace to allow the bones to heal without moving. Surgery is sometimes needed, to reposition the bones and hold the position with rods, pins, plates, or screws. Restraint for long periods of time may result in muscle and joint weakness or build up of fluid in tissues (edema). For this reason, physical therapy is often needed to regain strength and full range of motion. Recovery is complete when there is no bone motion at the fracture site and x-rays (radiographs) show complete healing.  MEDICATION   General anesthesia, sedation, or muscle relaxants may be needed to allow for realignment of the fracture. If pain medicine is needed, nonsteroidal anti-inflammatory medicines (aspirin and ibuprofen), or other minor pain relievers (acetaminophen), are often advised.  Do not take pain medicine for 7 days before surgery.  Stronger pain relievers may be prescribed by your caregiver. Use only as directed and only as much   as you need. SEEK MEDICAL CARE IF:   The following occur after restraint or surgery. (Report any of these signs immediately):  Swelling above or below the fracture  site.  Severe, persistent pain.  Blue or gray skin below the fracture site, especially under the nails. Numbness or loss of feeling below the fracture site. Document Released: 01/10/2005 Document Revised: 12/28/2011 Document Reviewed: 04/24/2008 Exit[employer] Patient Information 2015 [employer]. This information is not intended to replace advice given to you by your health care provider. Make sure you discuss any questions you have with your health care provider.  

## 2013-11-14 ENCOUNTER — Telehealth: Payer: Self-pay | Admitting: *Deleted

## 2013-11-14 NOTE — Telephone Encounter (Signed)
Pt called and stated she was not going to purchase a walking boot. Stated she has an ankle brace, surgical shoe and ace wrap that she was going to wear. Pt wanted to know how to wear these things. Told pt to wear ankle brace all day, do not sleep in it, wear surgical shoe when she gets up and wear ace wrap all day and pt stated she was going to sleep in ace wrap as well. Told pt to stay off of foot and elevate and use ice daily until she comes back to see dr Ardelle Antonwagoner. Pt understood.

## 2013-11-16 ENCOUNTER — Encounter: Payer: Self-pay | Admitting: Internal Medicine

## 2013-11-16 NOTE — Assessment & Plan Note (Signed)
Desires not to take lipitor.  Low cholesterol diet and exercise.  Follow.

## 2013-11-16 NOTE — Assessment & Plan Note (Signed)
Colonoscopy 03/15/13 diverticulosis.  Currently asymptomatic.

## 2013-11-16 NOTE — Assessment & Plan Note (Signed)
Sees Dr Marva PandaSkulskie. Just had EGD 02/2013 as outlined.  On aciphex.  Still some occasional acid reflux as outlined.  Desires no further w/up at this point.  Follow.

## 2013-11-16 NOTE — Assessment & Plan Note (Signed)
S/p biopsy.  Negative.   Had f/u left breast mammogram 03/2013.  Recommended f/u mammogram in fall 2015.  Schedule.

## 2013-11-16 NOTE — Assessment & Plan Note (Signed)
Palpitations as outlined.  EKG obtained and revealed SR with no acute ischemic changes.  After discussion and persistent issues, will refer back to cardiology for further evaluation.  Discussed sleep apnea.  She does snore.  Does not feel rested.  Wants to hold on sleep study.  Follow.

## 2013-11-16 NOTE — Assessment & Plan Note (Signed)
Right thyroid fullness.  Saw endocrinology.  Had biopsy.  States everything checked out fine.  Continues to f/u with endocrinology.

## 2013-11-16 NOTE — Progress Notes (Signed)
Subjective:    Patient ID: Carrie Noble, female    DOB: September 19, 1940, 73 y.o.   MRN: [id number]  HPI 73 year old female with past history of GERD with Barretts and a hiatal hernia, arthritis, hypercholesterolemia, thyroid goiter and osteoporosis.  She comes in today to follow up on these issues as well as for a complete physical exam. She is staying active.  Reports noticing some quivering (palpitations) mostly at night.  She is on omeprazole 40mg  q day.   No diarrhea now.  Still some acid reflux.  Sees Dr Gwen PoundsKowalski.  Had holter monitor previously.   No significant arrhythmia.  Breathing stable.  Has noticed more issues.  Seeing Dr Renae FicklePaul for her thyroid.     Past Medical History  Diagnosis Date  . Hyperlipidemia   . Gastroesophageal reflux   . Hiatal hernia 1989    status post Nissen fundoplication   . Hx of hysterectomy   . Tachycardia     Patient stated that this has been occuring frequently.  . Arthritis   . Allergy   . Thyroid disease     Goiter    Outpatient Encounter Prescriptions as of 11/07/2013  Medication Sig  . Biotin 1000 MCG tablet Take 1,000 mcg by mouth 3 (three) times daily.  . Calcium Carbonate (CALCIUM 600 PO) Take by mouth daily.  . Cholecalciferol (VITAMIN D PO) Take by mouth daily.  Marland Kitchen. omeprazole (PRILOSEC) 40 MG capsule Take 40 mg by mouth daily.  . Probiotic Product (PROBIOTIC DAILY PO) Take by mouth.  . ranitidine (ZANTAC) 150 MG tablet Take 150 mg by mouth at bedtime.  . vitamin B-12 (CYANOCOBALAMIN) 1000 MCG tablet Take 1,000 mcg by mouth daily.  . [DISCONTINUED] RABEprazole (ACIPHEX) 20 MG tablet Take 20 mg by mouth daily.    Review of Systems Patient denies any headache, lightheadedness or dizziness.  No significant sinus or allergy symptoms.  Palpitations as outlined.  No significant pain.  No cough or congestion.   On omeprazole.  Still some acid reflux.   No significant bowel change, such as diarrhea, BRBPR or melana.  No urine change.  Cholesterol  elevated.   Desires not to take cholesterol medicaton.   Discussed diet and exercise.       Objective:   Physical Exam  Filed Vitals:   11/07/13 0831  BP: 110/70  Pulse: 74  Temp: 97.8 F (36.6 C)   Blood pressure recheck:  46134/5772  73 year old female in no acute distress.   HEENT:  Nares- clear.  Oropharynx - without lesions. NECK:  Supple.  Nontender.  No audible bruit.  Right thyroid fullness.  HEART:  Appears to be regular. LUNGS:  No crackles or wheezing audible.  Respirations even and unlabored.  RADIAL PULSE:  Equal bilaterally.   ABDOMEN:  Soft, nontender.  Bowel sounds present and normal.  No audible abdominal bruit.  EXTREMITIES:  No increased edema present.  DP pulses palpable and equal bilaterally.        Assessment & Plan:  BREAST NODULE.  09/06/12 mammogram and ultrasound.  Biopsy negative.  Had left breast mammogram - 03/2013.  Saw Dr Lemar LivingsByrnett.  Recommended f/u mammogram in fall 2015.  Schedule.    Problem List Items Addressed This Visit   Abnormal mammogram     S/p biopsy.  Negative.   Had f/u left breast mammogram 03/2013.  Recommended f/u mammogram in fall 2015.  Schedule.       Relevant Orders  MM DIGITAL SCREENING BILATERAL   Barrett's esophagus     Sees Dr Marva PandaSkulskie. Just had EGD 02/2013 as outlined.  On aciphex.  Still some occasional acid reflux as outlined.  Desires no further w/up at this point.  Follow.        Diverticulosis     Colonoscopy 03/15/13 diverticulosis.  Currently asymptomatic.       Hyperlipidemia     Desires not to take lipitor.  Low cholesterol diet and exercise.  Follow.        Relevant Orders      Lipid panel (Completed)      Hepatic function panel (Completed)      Basic metabolic panel (Completed)   Osteoporosis   Relevant Orders      Vit D  25 hydroxy (rtn osteoporosis monitoring) (Completed)   Palpitations - Primary     Palpitations as outlined.  EKG obtained and revealed SR with no acute ischemic changes.  After  discussion and persistent issues, will refer back to cardiology for further evaluation.  Discussed sleep apnea.  She does snore.  Does not feel rested.  Wants to hold on sleep study.  Follow.       Relevant Orders      EKG 12-Lead (Completed)      CBC with Differential (Completed)      Ambulatory referral to Cardiology   Thyroid goiter     Right thyroid fullness.  Saw endocrinology.  Had biopsy.  States everything checked out fine.  Continues to f/u with endocrinology.       Relevant Orders      TSH (Completed)     HEALTH MAINTENANCE.  Physical today.   Mammogram as outlined.  Wants to hold on colonoscopy.     I spent 25 minutes with the patient and more than 50% of the time was spent in consultation regarding the above.

## 2013-11-18 ENCOUNTER — Telehealth: Payer: Self-pay

## 2013-11-18 NOTE — Telephone Encounter (Signed)
If she desires not to go, she can cancel the appt, but if return of problems, I would want her evaluated.

## 2013-11-18 NOTE — Telephone Encounter (Signed)
Please advise 

## 2013-11-18 NOTE — Telephone Encounter (Signed)
You will need to ask Dr Lorin PicketScott.

## 2013-11-18 NOTE — Telephone Encounter (Signed)
The patient called and stated her heart palpations have stopped.  She states she has not had this problem for over a week.  She is wondering if she needs to keep her referral apt with Dr.Gollan.  Please advise.

## 2013-11-18 NOTE — Telephone Encounter (Signed)
Pt notified and verbalized understanding.

## 2013-11-21 ENCOUNTER — Ambulatory Visit: Payer: Medicare Other | Admitting: Cardiovascular Disease

## 2013-11-25 ENCOUNTER — Encounter: Payer: Self-pay | Admitting: Internal Medicine

## 2013-12-03 ENCOUNTER — Encounter: Payer: Self-pay | Admitting: Internal Medicine

## 2013-12-03 ENCOUNTER — Other Ambulatory Visit: Payer: Self-pay | Admitting: Internal Medicine

## 2013-12-03 ENCOUNTER — Ambulatory Visit: Payer: Self-pay | Admitting: Internal Medicine

## 2013-12-03 ENCOUNTER — Ambulatory Visit: Payer: 59 | Admitting: Podiatry

## 2013-12-03 DIAGNOSIS — N6320 Unspecified lump in the left breast, unspecified quadrant: Secondary | ICD-10-CM

## 2013-12-03 DIAGNOSIS — Z1239 Encounter for other screening for malignant neoplasm of breast: Secondary | ICD-10-CM

## 2013-12-03 LAB — HM MAMMOGRAPHY

## 2013-12-03 NOTE — Progress Notes (Signed)
Order placed for bilateral diagnostic mammogram and left breast ultrasound.  (left breast mass, bx f/u and yearly)

## 2013-12-05 ENCOUNTER — Ambulatory Visit (INDEPENDENT_AMBULATORY_CARE_PROVIDER_SITE_OTHER): Payer: 59 | Admitting: Podiatry

## 2013-12-05 ENCOUNTER — Ambulatory Visit (INDEPENDENT_AMBULATORY_CARE_PROVIDER_SITE_OTHER): Payer: 59

## 2013-12-05 VITALS — BP 122/73 | HR 74 | Resp 16

## 2013-12-05 DIAGNOSIS — S93402A Sprain of unspecified ligament of left ankle, initial encounter: Secondary | ICD-10-CM

## 2013-12-05 DIAGNOSIS — S92152A Displaced avulsion fracture (chip fracture) of left talus, initial encounter for closed fracture: Secondary | ICD-10-CM

## 2013-12-05 DIAGNOSIS — S92302A Fracture of unspecified metatarsal bone(s), left foot, initial encounter for closed fracture: Secondary | ICD-10-CM

## 2013-12-05 NOTE — Progress Notes (Signed)
Patient ID: Carrie MorLinda W Noble, female   DOB: [birth year], 73 y.o.   MRN: [id number]  Subjective: Carrie Noble returns the office they follow up evaluation of left foot and ankle pain. She did not proceed with a cam boot  As recommended last appointment and she's been wearing surgical shoe with an ankle brace. She states that she wears this all the time. She does state that since last appointment she has had significant decrease in symptoms. She does that she continues have some discomfort on the outside aspect of her foot for which she points to the fifth metatarsal base as well as some swelling over the area. She denies any acute changes his last appointment. Denies any systemic complaints such as fevers, chills, nausea, vomiting. No other complaints at this time.  Objective: AAO x3, NAD DP/PT pulses palpable bilaterally, CRT less than 3 seconds Protective sensation intact with Simms Weinstein monofilament, vibratory sensation intact, Achilles tendon reflex intact There is tenderness overlying the left fifth metatarsal base and there is pain with vibratory sensation at this area. There is mild overlying edema in this area. No overlying skin changes. There is no pain over the course of the dorsal aspect of the talus of the navicular. There is no tenderness upon vibratory sensation. There is mild pain along the course of the ATFL. There is no pain with anterior drawer test, and also appears to be negative although somewhat limited. Today there is pain overlying both the medial and the lateral aspect of the ankle however there is no pain with vibratory sensation. No pain on the proximal aspect the leg or pain within the syndesmosis. MMT 4/5, limited do to pain No calf pain, swelling, warmth, erythema No open lesions.  Assessment: 73 year old female with resolving likely ATFL sprain, fifth metatarsal base fracture, talonavicular joint avulsion fracture.  Plan: -X-rays were obtained and reviewed with the  patient. -Treatment options discussed including alternatives, risks, competitions. -At this time would recommend continued immobilization for another 3 weeks approximately. Continue with ice as well as limiting ambulation. Signs and symptoms of DVT and other complications of immobilization were discussed the patient. If any are to occur she is directed to the emergency room. -Follow-up in 3 weeks at that time repeat x-rays will be obtained. At that time if she is having resolution of symptoms she will likely transition back into a regular shoe pending x-ray. Call sooner with any questions, concerns, changes symptoms.

## 2013-12-12 ENCOUNTER — Other Ambulatory Visit: Payer: Self-pay | Admitting: *Deleted

## 2013-12-12 MED ORDER — OMEPRAZOLE 20 MG PO CPDR: 20.0000 mg | DELAYED_RELEASE_CAPSULE | Freq: Two times a day (BID) | ORAL | Status: DC

## 2013-12-23 ENCOUNTER — Ambulatory Visit (INDEPENDENT_AMBULATORY_CARE_PROVIDER_SITE_OTHER): Payer: Medicare Other | Admitting: Internal Medicine

## 2013-12-23 ENCOUNTER — Encounter: Payer: Self-pay | Admitting: Internal Medicine

## 2013-12-23 VITALS — BP 110/70 | HR 66 | Temp 97.6°F | Ht 64.0 in | Wt 142.2 lb

## 2013-12-23 DIAGNOSIS — S82892A Other fracture of left lower leg, initial encounter for closed fracture: Secondary | ICD-10-CM

## 2013-12-23 DIAGNOSIS — R350 Frequency of micturition: Secondary | ICD-10-CM

## 2013-12-23 DIAGNOSIS — E785 Hyperlipidemia, unspecified: Secondary | ICD-10-CM

## 2013-12-23 DIAGNOSIS — R928 Other abnormal and inconclusive findings on diagnostic imaging of breast: Secondary | ICD-10-CM

## 2013-12-23 DIAGNOSIS — K2270 Barrett's esophagus without dysplasia: Secondary | ICD-10-CM

## 2013-12-23 DIAGNOSIS — M810 Age-related osteoporosis without current pathological fracture: Secondary | ICD-10-CM

## 2013-12-23 DIAGNOSIS — E049 Nontoxic goiter, unspecified: Secondary | ICD-10-CM

## 2013-12-23 DIAGNOSIS — K219 Gastro-esophageal reflux disease without esophagitis: Secondary | ICD-10-CM

## 2013-12-23 MED ORDER — OXYBUTYNIN CHLORIDE ER 5 MG PO TB24: 5.0000 mg | ORAL_TABLET | Freq: Every day | ORAL | Status: DC

## 2013-12-23 NOTE — Progress Notes (Signed)
Subjective:    Patient ID: Gilmer MorLinda W Philyaw, female    DOB: 27-Feb-1940, 73 y.o.   MRN: [id number]  HPI 73 year old female with past history of GERD with Barretts and a hiatal hernia, arthritis, hypercholesterolemia, thyroid goiter and osteoporosis.  She comes in today for a scheduled follow up. She is staying active.  She is on omeprazole.   No diarrhea now. Appears acid reflux controlled.   Sees Dr Gwen PoundsKowalski.  Had holter monitor previously.   No significant arrhythmia.  Heart appears to be stable.  Breathing stable.   Seeing Dr Renae FicklePaul for her thyroid.  She fractured her left foot/ankle after a fall.  Seeing Dr Ardelle AntonWagoner. Doing better.  Apparently took herself out of the boot.  She reports sore throat previously.  Some drainage.  Taking benadryl.  Some urinary issues.  States ditropan helped previously.  Would like to try again.  Had no problems with ditropan.  Bowels stable.  Lactose free mild helps GI symptoms.    Past Medical History  Diagnosis Date  . Hyperlipidemia   . Gastroesophageal reflux   . Hiatal hernia 1989    status post Nissen fundoplication   . Hx of hysterectomy   . Tachycardia     Patient stated that this has been occuring frequently.  . Arthritis   . Allergy   . Thyroid disease     Goiter    Outpatient Encounter Prescriptions as of 12/23/2013  Medication Sig  . Biotin 1000 MCG tablet Take 1,000 mcg by mouth 3 (three) times daily.  . Calcium Carbonate (CALCIUM 600 PO) Take by mouth daily.  . Cholecalciferol (VITAMIN D PO) Take by mouth daily.  Marland Kitchen. omeprazole (PRILOSEC) 20 MG capsule Take 1 capsule (20 mg total) by mouth 2 (two) times daily.  . Probiotic Product (PROBIOTIC DAILY PO) Take by mouth.  . ranitidine (ZANTAC) 150 MG tablet Take 150 mg by mouth at bedtime.  . vitamin B-12 (CYANOCOBALAMIN) 1000 MCG tablet Take 1,000 mcg by mouth daily.    Review of Systems Patient denies any headache, lightheadedness or dizziness.  Sore throat - better.  Some drainage.  No  palpitations reported.  No chest pain or tightness.   No cough or congestion.  Some drainage.  On omeprazole.  No significant bowel change, such as diarrhea, BRBPR or melana.  No urine change.  Cholesterol elevated.   Desires not to take cholesterol medicaton.   Have discussed diet and exercise.  Urinary issues as outlined.       Objective:   Physical Exam  Filed Vitals:   12/23/13 1453  BP: 110/70  Pulse: 66  Temp: 97.6 F (36.4 C)   Blood pressure recheck:  44120/6874  73 year old female in no acute distress.   HEENT:  Nares- slightly erythematous turbinates.  Oropharynx - without lesions.  No sinus tenderness to palpation.   NECK:  Supple.  Nontender.  No audible bruit.  Right thyroid fullness.  HEART:  Appears to be regular. LUNGS:  No crackles or wheezing audible.  Respirations even and unlabored.  RADIAL PULSE:  Equal bilaterally.   ABDOMEN:  Soft, nontender.  Bowel sounds present and normal.  No audible abdominal bruit.  EXTREMITIES:  No increased edema present.  DP pulses palpable and equal bilaterally.        Assessment & Plan:  1. Gastroesophageal reflux disease, esophagitis presence not specified On omeprazole.  Appears to be doing well.    2. Barrett's esophagus EGD 03/15/13 - irregular Z  line, esophageal changes c/w short segment of Barretts, few gastric polyps, nissen fundoplication.    3. Thyroid goiter Right thyroid fullness.  Saw endocrinology.  Had biopsy.  States everything checked out fine.  Continue f/u with Dr Renae FicklePaul.    4. Osteoporosis Continue vitamin D supplements.  Continue weight bearing exercise.    5. Hyperlipidemia Low cholesterol diet.  Follow lipid panel.    6. Abnormal mammogram Previous w/up and biopsy as outlined.  Diagnostic mammogram 12/03/13 - birads II.    7. Ankle fracture, left Seeing Dr Ardelle AntonWagoner.  Doing well.    8. Urinary frequency Restart ditropan.    BREAST NODULE.  09/06/12 mammogram and ultrasound.  Biopsy negative.  Had left  breast mammogram - 03/2013.  Mammogram 12/03/13 - Birads II.    HEALTH MAINTENANCE.  Physical 11/07/13.   Mammogram as outlined.  Colonoscopy 03/15/13 - tortuous colon and diverticulosis.  Recommend f/u colonoscopy in 10 years.      I spent 25 minutes with the patient and more than 50% of the time was spent in consultation regarding the above.

## 2013-12-23 NOTE — Progress Notes (Signed)
Pre visit review using our clinic review tool, if applicable. No additional management support is needed unless otherwise documented below in the visit note. 

## 2013-12-23 NOTE — Patient Instructions (Signed)
Saline nasal spray - flush nose at least 2-3x/day  nasacort nasal spray - 2 sprays each nostril one time per day.  Do this in the evening.    Can try zyrtec one per day instead of benadryl.

## 2013-12-26 ENCOUNTER — Ambulatory Visit (INDEPENDENT_AMBULATORY_CARE_PROVIDER_SITE_OTHER): Payer: 59 | Admitting: Podiatry

## 2013-12-26 ENCOUNTER — Ambulatory Visit (INDEPENDENT_AMBULATORY_CARE_PROVIDER_SITE_OTHER): Payer: 59

## 2013-12-26 VITALS — BP 123/71 | HR 72 | Resp 16

## 2013-12-26 DIAGNOSIS — S92302A Fracture of unspecified metatarsal bone(s), left foot, initial encounter for closed fracture: Secondary | ICD-10-CM

## 2013-12-26 DIAGNOSIS — S93402A Sprain of unspecified ligament of left ankle, initial encounter: Secondary | ICD-10-CM

## 2013-12-26 DIAGNOSIS — S92152A Displaced avulsion fracture (chip fracture) of left talus, initial encounter for closed fracture: Secondary | ICD-10-CM

## 2013-12-26 NOTE — Progress Notes (Signed)
Patient ID: Carrie MorLinda W Noble, female   DOB: [birth year], 73 y.o.   MRN: [id number]  Subjective: 73 year old female returns the office today for follow up evaluation of likely ATFL sprain in talar avulsion fracture. She states that she is doing well without much discomfort. Since last appointment she has transitioned herself out of the surgical shoe back into a regular sneaker. She states that since wearing the shoes she has minimal discomfort or after prolonged periods of activity. She [place] where she walked around in a regular sneaker which she said she had no problems doing. No other complaints at this time. Denies any acute changes his last appointment.  Objective: AAO x3, NAD DP/PT pulses palpable bilaterally, CRT less than 3 seconds Protective sensation intact with Simms Weinstein monofilament, vibratory sensation intact, Achilles tendon reflex intact There is mild tenderness over the dorsal aspect of the talus. There is also mild tenderness over one the course of the ATFL. There is no pain along the course of the fifth metatarsal base. There is no overlying edema, erythema, increase in warmth to the foot/ankle. There is no pain with range of motion of the ankle joint. No pain with anterior drawer test and appears to be negative. MMT 5/5, ROM WNL No open lesions or pre-ulcerative lesions. No pain with calf compression, swelling, warmth, erythema.  Assessment: 73 year old female follow up evaluation status post ATFL sprain, talar avulsion fracture  Plan: -X-rays were obtained and reviewed the patient. -Treatment options were discussed including alternatives, risks, competitions. -At this time as the patient is artery transitioned out of the surgical shoe back into a regular sneaker she can continue do this. I also discussed with her various stretching and strengthening exercises for the ankle sprain. Discussed with her that if she has any increase in symptoms  she is to go back into the immobilization. She states that she also has ordered an ankle brace online which she has not yet received. She can use this in the short-term if needed. Continue to monitor for any changes. Follow-up in one month or sooner if any palms are to arise. In the meantime, call the office with any questions, concerns, change in symptoms.

## 2013-12-26 NOTE — Patient Instructions (Signed)

## 2013-12-29 ENCOUNTER — Encounter: Payer: Self-pay | Admitting: Internal Medicine

## 2013-12-29 DIAGNOSIS — S82892A Other fracture of left lower leg, initial encounter for closed fracture: Secondary | ICD-10-CM | POA: Insufficient documentation

## 2013-12-29 DIAGNOSIS — R350 Frequency of micturition: Secondary | ICD-10-CM | POA: Insufficient documentation

## 2014-01-07 ENCOUNTER — Encounter: Payer: Self-pay | Admitting: Internal Medicine

## 2014-01-28 ENCOUNTER — Ambulatory Visit: Payer: 59 | Admitting: Podiatry

## 2014-01-30 ENCOUNTER — Telehealth: Payer: Self-pay | Admitting: *Deleted

## 2014-01-30 NOTE — Telephone Encounter (Signed)
Left message for pt regarding her note.  Explained to her that if the consulting physician felt a repeat endoscopy was necessary that I would have to defer to their judgement.  Left word for her to call back if questions or problems.  Let me know if any problems or if anything further needed.

## 2014-01-30 NOTE — Telephone Encounter (Signed)
Pt walked in and completed a "walkin form". Form placed in green folder.

## 2014-01-31 NOTE — Telephone Encounter (Signed)
Pt returned Dr. Roby LoftsScott's call & I went over the message again. I also told her is she decided that she wanted to go to [facility] instead, we could let Dr. Lorin PicketScott know so that a new referral can be placed. I also told patient that she can also look up [facility] online.

## 2014-02-04 ENCOUNTER — Ambulatory Visit (INDEPENDENT_AMBULATORY_CARE_PROVIDER_SITE_OTHER): Payer: Medicare Other

## 2014-02-04 ENCOUNTER — Ambulatory Visit (INDEPENDENT_AMBULATORY_CARE_PROVIDER_SITE_OTHER): Payer: Medicare Other | Admitting: Podiatry

## 2014-02-04 VITALS — BP 116/66 | HR 76 | Resp 16

## 2014-02-04 DIAGNOSIS — T148 Other injury of unspecified body region: Secondary | ICD-10-CM

## 2014-02-04 DIAGNOSIS — S93402A Sprain of unspecified ligament of left ankle, initial encounter: Secondary | ICD-10-CM

## 2014-02-04 DIAGNOSIS — T148XXA Other injury of unspecified body region, initial encounter: Secondary | ICD-10-CM

## 2014-02-04 NOTE — Progress Notes (Signed)
Patient ID: Carrie MorLinda W Noble, female   DOB: [birth year], 74 y.o.   MRN: [id number]  Subjective: 74 year old female returns the office they for follow-up evaluation of left ATFL sprain, talar avulsion fracture. Since last appointment, she has returned to regular shoe gear for which she states that she is able to wear without any difficulty. She states that she gets periodic pain in one spot in her ankle for which she points over the lateral aspect of the ankle. She states the pain is intermittent. She states that she does not feel unstable with ambulation. She denies any recent swelling, erythema, increase in warmth to the area. No other complaints at this time in no acute changes since last appointment. Denies any systemic complaints as fevers, chills, nausea, vomiting.  Objective: AAO x3, NAD DP/PT pulses palpable bilaterally, CRT less than 3 seconds Protective sensation intact with Simms Weinstein monofilament, vibratory sensation intact, Achilles tendon reflex intact Mild tenderness palpation overlying the ATFL on the left ankle. There is no tenderness overlying the CFL, PTFL. There is no pain on the syndesmosis of the deltoid ligaments. No pinpoint bony tenderness or pain the vibratory sensation to the foot or ankle. There is no tenderness overlying the site of the avulsion fracture off the talus. There is no overlying edema, erythema, increase in warmth to bilateral lower extremities. Range of motion of the ankle joint and subtalar joint is a normal limits and intact. MMT 5/5, ROM WNL No open lesions or pre-ulcerative lesions are identified. No pain with calf compression, swelling, warmth, erythema.  Assessment: 74 year old female follow up evaluation left ATFL sprain, talar avulsion fracture.   Plan: -X-rays were obtained and reviewed the patient. There is a small avulsion fracture on the dorsal aspect of the talus on the lateral x-ray which she has no symptoms of this area. No other areas of  fracture is identified. -Treatment options were discussed with the patient including alternatives, risks, complications. -At this time as she has no symptoms overlying the talar avulsion fracture will start stretching slashing can exercises of the lateral ankle sprain. These exercises were discussed with her and she was provided written instructions. -Continue to wear supportive shoe gear. She can continue brace as necessary however would not wear if not needed. -Follow-up in 6 weeks or sooner should any problems in the meantime, encouraged to call the office with any questions, concerns, change in symptoms.

## 2014-02-04 NOTE — Patient Instructions (Signed)

## 2014-02-20 ENCOUNTER — Other Ambulatory Visit: Payer: Self-pay | Admitting: *Deleted

## 2014-02-20 MED ORDER — OMEPRAZOLE 20 MG PO CPDR: 20.0000 mg | DELAYED_RELEASE_CAPSULE | Freq: Two times a day (BID) | ORAL | Status: DC

## 2014-03-10 ENCOUNTER — Telehealth: Payer: Self-pay | Admitting: Internal Medicine

## 2014-03-10 NOTE — Telephone Encounter (Signed)
TELEPHONE ADVICE RECORD [facility]  Patient Name: Carrie LarkLINDA Noble  DOB: 1940/06/09    Initial Comment Caller says she had a lot of pain in her stomach; it felt like her bowels were impacted. She had a BM today and pain is not as bad, but has diarrhea, Has irritable Bowel    Nurse Assessment  Nurse: Odis LusterBowers, RN, Bjorn Loserhonda Date/Time Lamount Cohen(Eastern Time): 03/10/2014 5:28:23 PM  Confirm and document reason for call. If symptomatic, describe symptoms. ---Caller says she had a lot of pain in her stomach; it felt like her bowels were impacted. She had a BM today and pain is not as bad, but has diarrhea, Has irritable Bowel. Has been eating bland food today and seems to be a bit better. Reports loose stools at this time. Abd pain in under her naval in the middle. Denies blood in stools, denies fever.  Has the patient traveled out of the country within the last 30 days? ---No  Does the patient require triage? ---Yes  Related visit to physician within the last 2 weeks? ---No  Does the PT have any chronic conditions? (i.e. diabetes, asthma, etc.) ---Yes  List chronic conditions. ---irritable bowel, GERD;     Guidelines    Guideline Title Affirmed Question Affirmed Notes  Diarrhea Abdominal pain (Exception: Pain clears with each passage of diarrhea stool)    Final Disposition User   See Physician within [address]24 Hours RowenaBowers, Charity fundraiserN, W.W. Grainger In[place]

## 2014-03-11 ENCOUNTER — Ambulatory Visit: Payer: Medicare Other | Admitting: Nurse Practitioner

## 2014-03-11 NOTE — Telephone Encounter (Signed)
Pt already has an appt with Lyla Sonarrie this morning @ 11:15.

## 2014-03-11 NOTE — Telephone Encounter (Signed)
FYI

## 2014-03-18 ENCOUNTER — Ambulatory Visit: Payer: Medicare Other | Admitting: Podiatry

## 2014-03-21 ENCOUNTER — Other Ambulatory Visit (INDEPENDENT_AMBULATORY_CARE_PROVIDER_SITE_OTHER): Payer: Medicare Other

## 2014-03-21 DIAGNOSIS — K219 Gastro-esophageal reflux disease without esophagitis: Secondary | ICD-10-CM

## 2014-03-21 DIAGNOSIS — E785 Hyperlipidemia, unspecified: Secondary | ICD-10-CM

## 2014-03-21 DIAGNOSIS — M810 Age-related osteoporosis without current pathological fracture: Secondary | ICD-10-CM

## 2014-03-21 LAB — BASIC METABOLIC PANEL
BUN: 14 mg/dL (ref 6–23)
CHLORIDE: 105 meq/L (ref 96–112)
CO2: 29 mEq/L (ref 19–32)
CREATININE: 1.02 mg/dL (ref 0.40–1.20)
Calcium: 9.9 mg/dL (ref 8.4–10.5)
GFR: 56.17 mL/min — ABNORMAL LOW (ref >60.00)
Glucose, Bld: 101 mg/dL — ABNORMAL HIGH (ref 70–99)
Potassium: 4.6 mEq/L (ref 3.5–5.1)
Sodium: 140 mEq/L (ref 135–145)

## 2014-03-21 LAB — LIPID PANEL
Cholesterol: 253 mg/dL — ABNORMAL HIGH (ref 0–200)
HDL: 57.9 mg/dL (ref >39.00)
LDL CALC: 169 mg/dL — AB (ref 0–99)
NonHDL: 195.1
Total CHOL/HDL Ratio: 4
Triglycerides: 131 mg/dL (ref 0.0–149.0)
VLDL: 26.2 mg/dL (ref 0.0–40.0)

## 2014-03-21 LAB — HEPATIC FUNCTION PANEL
ALBUMIN: 4.4 g/dL (ref 3.5–5.2)
ALT: 16 U/L (ref 0–35)
AST: 17 U/L (ref 0–37)
Alkaline Phosphatase: 120 U/L — ABNORMAL HIGH (ref 39–117)
Bilirubin, Direct: 0.1 mg/dL (ref 0.0–0.3)
TOTAL PROTEIN: 7.6 g/dL (ref 6.0–8.3)
Total Bilirubin: 0.6 mg/dL (ref 0.2–1.2)

## 2014-03-25 ENCOUNTER — Ambulatory Visit (INDEPENDENT_AMBULATORY_CARE_PROVIDER_SITE_OTHER): Payer: Medicare Other | Admitting: Internal Medicine

## 2014-03-25 ENCOUNTER — Encounter: Payer: Self-pay | Admitting: Internal Medicine

## 2014-03-25 ENCOUNTER — Ambulatory Visit: Payer: Medicare Other | Admitting: Internal Medicine

## 2014-03-25 VITALS — BP 110/70 | HR 66 | Temp 97.9°F | Ht 64.0 in | Wt 141.4 lb

## 2014-03-25 DIAGNOSIS — K2270 Barrett's esophagus without dysplasia: Secondary | ICD-10-CM

## 2014-03-25 DIAGNOSIS — R748 Abnormal levels of other serum enzymes: Secondary | ICD-10-CM

## 2014-03-25 DIAGNOSIS — G479 Sleep disorder, unspecified: Secondary | ICD-10-CM

## 2014-03-25 DIAGNOSIS — R928 Other abnormal and inconclusive findings on diagnostic imaging of breast: Secondary | ICD-10-CM

## 2014-03-25 DIAGNOSIS — E049 Nontoxic goiter, unspecified: Secondary | ICD-10-CM

## 2014-03-25 DIAGNOSIS — E785 Hyperlipidemia, unspecified: Secondary | ICD-10-CM

## 2014-03-25 DIAGNOSIS — Z0000 Encounter for general adult medical examination without abnormal findings: Secondary | ICD-10-CM

## 2014-03-25 DIAGNOSIS — K5730 Diverticulosis of large intestine without perforation or abscess without bleeding: Secondary | ICD-10-CM

## 2014-03-25 DIAGNOSIS — K219 Gastro-esophageal reflux disease without esophagitis: Secondary | ICD-10-CM

## 2014-03-25 NOTE — Progress Notes (Signed)
Patient ID: Carrie MorLinda W Lalanne, female   DOB: [birth year], 74 y.o.   MRN: [id number]   Subjective:    Patient ID: Carrie Noble, female    DOB: [birth year], 74 y.o.   MRN: [id number]  HPI  Patient here for a scheduled follow up.  States she is doing better.  Acid reflux is better.  On omeprazole 20mg  q day.  Still some drainage, but overall improved.  No chest congestion or cough.  Some trouble sleeping.  Discussed treatment options.  Wants to try melatonin first.  Eating and drinking well.  Bowels stable.     Past Medical History  Diagnosis Date  . Hyperlipidemia   . Gastroesophageal reflux   . Hiatal hernia 1989    status post Nissen fundoplication   . Hx of hysterectomy   . Tachycardia     Patient stated that this has been occuring frequently.  . Arthritis   . Allergy   . Thyroid disease     Goiter    Current Outpatient Prescriptions on File Prior to Visit  Medication Sig Dispense Refill  . omeprazole (PRILOSEC) 20 MG capsule Take 1 capsule (20 mg total) by mouth 2 (two) times daily. (Patient taking differently: Take 20 mg by mouth daily. ) 180 capsule 1  . oxybutynin (DITROPAN-XL) 5 MG 24 hr tablet Take 1 tablet (5 mg total) by mouth at bedtime. 30 tablet 1  . Biotin 1000 MCG tablet Take 1,000 mcg by mouth 3 (three) times daily.    . Calcium Carbonate (CALCIUM 600 PO) Take by mouth daily.    . Cholecalciferol (VITAMIN D PO) Take by mouth daily.    . ranitidine (ZANTAC) 150 MG tablet Take 150 mg by mouth at bedtime.    . vitamin B-12 (CYANOCOBALAMIN) 1000 MCG tablet Take 1,000 mcg by mouth daily.     No current facility-administered medications on file prior to visit.    Review of Systems  Constitutional: Negative for appetite change and unexpected weight change.  HENT: Positive for congestion (some throat congestion). Negative for sinus pressure.   Respiratory: Negative for cough, chest tightness and shortness of breath.   Cardiovascular: Negative for chest pain, palpitations and  leg swelling.  Gastrointestinal: Negative for nausea, vomiting, abdominal pain and diarrhea.  Genitourinary: Negative for dysuria and difficulty urinating.  Musculoskeletal: Negative for joint swelling.  Skin: Negative for color change and rash.  Neurological: Negative for dizziness, light-headedness and headaches.       Objective:    Physical Exam  Constitutional: She appears well-developed and well-nourished. No distress.  HENT:  Nose: Nose normal.  Mouth/Throat: Oropharynx is clear and moist.  Neck: Neck supple. Thyromegaly (increased fullnesss - thyroid) present.  Cardiovascular: Normal rate and regular rhythm.   Pulmonary/Chest: Breath sounds normal. No respiratory distress. She has no wheezes.  Abdominal: Soft. Bowel sounds are normal. There is no tenderness.  Musculoskeletal: She exhibits no edema or tenderness.  Lymphadenopathy:    She has no cervical adenopathy.  Skin: No rash noted. No erythema.    BP 110/70 mmHg  Pulse 66  Temp(Src) 97.9 F (36.6 C) (Oral)  Ht 5\' 4"  (1.626 m)  Wt 141 lb 6 oz (64.127 kg)  BMI 24.25 kg/m2  SpO2 97% Wt Readings from Last 3 Encounters:  03/25/14 141 lb 6 oz (64.127 kg)  12/23/13 142 lb 4 oz (64.524 kg)  11/12/13 140 lb (63.504 kg)     Lab Results  Component Value Date   WBC 5.4 11/07/2013  HGB 12.8 11/07/2013   HCT 38.3 11/07/2013   PLT 339.0 11/07/2013   GLUCOSE 101* 03/21/2014   CHOL 253* 03/21/2014   TRIG 131.0 03/21/2014   HDL 57.90 03/21/2014   LDLDIRECT 152.2 11/07/2013   LDLCALC 169* 03/21/2014   ALT 16 03/21/2014   AST 17 03/21/2014   NA 140 03/21/2014   K 4.6 03/21/2014   CL 105 03/21/2014   CREATININE 1.02 03/21/2014   BUN 14 03/21/2014   CO2 29 03/21/2014   TSH 1.05 11/07/2013   HGBA1C 5.7 03/29/2013       Assessment & Plan:   Problem List Items Addressed This Visit    Abnormal mammogram    S/p biopsy - negative.  Had f/u left breast ultrasound in 03/2013.  Mammogram 12/03/13 - [facility]ads II.          Barrett's esophagus    EGD 03/15/13 - results as outlined in overview.  Recommended f/u EGD in 3 years.  Symptoms better.        Difficulty sleeping - Primary    Discussed with her today.  Discussed treatment options.  She wants to try melatonin.        Diverticulosis    Colonoscopy 03/15/13 as outlined.  Recommend f/u colonoscopy in 10 years.        GERD (gastroesophageal reflux disease)    Acid reflux controlled on omeprazole.  Follow.        Health care maintenance    Physical 11/07/13.  Mammogram 12/03/13 - Birads II.  Colonoscopy 03/15/13.  Recommended f/u colonoscopy in 10 years.        Hyperlipidemia    Low cholesterol diet and exercise.  Recent LDL 169.  Discussed medication.  She declines.  Follow lipid panel.        Thyroid goiter    Has the right thyroid fullness.  Has been seeing Dr Renae Fickle.  States overdue.  Schedule f/u appt.        Relevant Orders   Ambulatory referral to Endocrinology    Other Visit Diagnoses    Elevated alkaline phosphatase level        Relevant Orders    Hepatic function panel    Basic metabolic panel      I spent 25 minutes with the patient and more than 50% of the time was spent in consultation regarding the above.     Dale , MD

## 2014-03-25 NOTE — Progress Notes (Signed)
Pre visit review using our clinic review tool, if applicable. No additional management support is needed unless otherwise documented below in the visit note. 

## 2014-03-30 ENCOUNTER — Encounter: Payer: Self-pay | Admitting: Internal Medicine

## 2014-03-30 DIAGNOSIS — Z0000 Encounter for general adult medical examination without abnormal findings: Secondary | ICD-10-CM | POA: Insufficient documentation

## 2014-03-30 DIAGNOSIS — G479 Sleep disorder, unspecified: Secondary | ICD-10-CM | POA: Insufficient documentation

## 2014-03-30 NOTE — Assessment & Plan Note (Signed)
Discussed with her today.  Discussed treatment options.  She wants to try melatonin.

## 2014-03-30 NOTE — Assessment & Plan Note (Signed)
Colonoscopy 03/15/13 as outlined.  Recommend f/u colonoscopy in 10 years.

## 2014-03-30 NOTE — Assessment & Plan Note (Signed)
Acid reflux controlled on omeprazole.  Follow.

## 2014-03-30 NOTE — Assessment & Plan Note (Signed)
S/p biopsy - negative.  Had f/u left breast ultrasound in 03/2013.  Mammogram 12/03/13 - Birads II.

## 2014-03-30 NOTE — Assessment & Plan Note (Signed)
Physical 11/07/13.  Mammogram 12/03/13 - Birads II.  Colonoscopy 03/15/13.  Recommended f/u colonoscopy in 10 years.

## 2014-03-30 NOTE — Assessment & Plan Note (Signed)
Has the right thyroid fullness.  Has been seeing Dr Renae FicklePaul.  States overdue.  Schedule f/u appt.

## 2014-03-30 NOTE — Assessment & Plan Note (Signed)
Low cholesterol diet and exercise.  Recent LDL 169.  Discussed medication.  She declines.  Follow lipid panel.

## 2014-03-30 NOTE — Assessment & Plan Note (Signed)
EGD 03/15/13 - results as outlined in overview.  Recommended f/u EGD in 3 years.  Symptoms better.

## 2014-04-22 ENCOUNTER — Other Ambulatory Visit: Payer: Medicare Other

## 2014-04-28 ENCOUNTER — Other Ambulatory Visit (INDEPENDENT_AMBULATORY_CARE_PROVIDER_SITE_OTHER): Payer: Medicare Other

## 2014-04-28 DIAGNOSIS — R748 Abnormal levels of other serum enzymes: Secondary | ICD-10-CM

## 2014-04-28 LAB — HEPATIC FUNCTION PANEL
ALBUMIN: 4.2 g/dL (ref 3.5–5.2)
ALK PHOS: 115 U/L (ref 39–117)
ALT: 20 U/L (ref 0–35)
AST: 21 U/L (ref 0–37)
Bilirubin, Direct: 0.1 mg/dL (ref 0.0–0.3)
TOTAL PROTEIN: 7.3 g/dL (ref 6.0–8.3)
Total Bilirubin: 0.4 mg/dL (ref 0.2–1.2)

## 2014-04-28 LAB — BASIC METABOLIC PANEL WITH GFR
BUN: 16 mg/dL (ref 6–23)
CO2: 27 meq/L (ref 19–32)
Calcium: 9.6 mg/dL (ref 8.4–10.5)
Chloride: 102 meq/L (ref 96–112)
Creatinine, Ser: 0.81 mg/dL (ref 0.40–1.20)
GFR: 73.27 mL/min
Glucose, Bld: 86 mg/dL (ref 70–99)
Potassium: 4.7 meq/L (ref 3.5–5.1)
Sodium: 136 meq/L (ref 135–145)

## 2014-04-29 ENCOUNTER — Encounter: Payer: Self-pay | Admitting: *Deleted

## 2014-05-12 ENCOUNTER — Telehealth: Payer: Self-pay

## 2014-05-12 NOTE — Telephone Encounter (Signed)
The patient called and stated she is need of uric acid levels taken.  She states she does not want an office visit, but wants a lab test scheduled to check her levels.

## 2014-05-12 NOTE — Telephone Encounter (Signed)
Does she think that she has gout?  Need diagnosis for lab check.  Thanks

## 2014-05-13 NOTE — Telephone Encounter (Signed)
Pt states that her bones are aching all over even her toes since Friday (better today). Didn't know if she should see rheumatology or Dr. Lorin PicketScott. Please advise

## 2014-05-14 NOTE — Telephone Encounter (Signed)
Ok.  Does she need me to place the order for the referral and us get her an appt?

## 2014-05-14 NOTE — Telephone Encounter (Signed)
I do not mind checking uric acid level, but this does not sound like gout.  We can schedule her an appointment for me to evaluate her and then can decide best labs and if referral needed.

## 2014-05-14 NOTE — Telephone Encounter (Signed)
The patient called back and stated she is going to be treated at Dr.W.[facility]ern[facility]le's office for her "aching bones".

## 2014-05-14 NOTE — Telephone Encounter (Signed)
Left message to notify patient that if she needs us to make her a referral or help with getting an appointment to please me know.

## 2014-07-14 ENCOUNTER — Ambulatory Visit (INDEPENDENT_AMBULATORY_CARE_PROVIDER_SITE_OTHER): Payer: Medicare Other | Admitting: Internal Medicine

## 2014-07-14 ENCOUNTER — Encounter: Payer: Self-pay | Admitting: Internal Medicine

## 2014-07-14 VITALS — BP 118/68 | HR 82 | Temp 98.0°F | Resp 16 | Ht 64.0 in | Wt 137.2 lb

## 2014-07-14 DIAGNOSIS — R002 Palpitations: Secondary | ICD-10-CM | POA: Diagnosis not present

## 2014-07-14 DIAGNOSIS — R0989 Other specified symptoms and signs involving the circulatory and respiratory systems: Secondary | ICD-10-CM | POA: Insufficient documentation

## 2014-07-14 DIAGNOSIS — J069 Acute upper respiratory infection, unspecified: Secondary | ICD-10-CM | POA: Diagnosis not present

## 2014-07-14 DIAGNOSIS — J040 Acute laryngitis: Secondary | ICD-10-CM | POA: Insufficient documentation

## 2014-07-14 DIAGNOSIS — B9789 Other viral agents as the cause of diseases classified elsewhere: Principal | ICD-10-CM

## 2014-07-14 DIAGNOSIS — M25551 Pain in right hip: Secondary | ICD-10-CM | POA: Insufficient documentation

## 2014-07-14 MED ORDER — PREDNISONE 10 MG PO TABS: ORAL_TABLET | ORAL | Status: DC

## 2014-07-14 MED ORDER — HYDROCOD POLST-CPM POLST ER 10-8 MG/5ML PO SUER: 5.0000 mL | Freq: Every evening | ORAL | Status: DC | PRN

## 2014-07-14 MED ORDER — BENZONATATE 200 MG PO CAPS: 200.0000 mg | ORAL_CAPSULE | Freq: Three times a day (TID) | ORAL | Status: DC | PRN

## 2014-07-14 NOTE — Assessment & Plan Note (Signed)
The bruit is intermittent,  She has hyperlipidemia.  Carotid ultrasounds ordered

## 2014-07-14 NOTE — Assessment & Plan Note (Signed)
Voice rest,  Cough suppression and prednisone

## 2014-07-14 NOTE — Assessment & Plan Note (Signed)
Predniosne taper, tussionex for nocturnal cough and tessalon fr daytime cough

## 2014-07-14 NOTE — Progress Notes (Signed)
Pre-visit discussion using our clinic review tool. No additional management support is needed unless otherwise documented below in the visit note.  

## 2014-07-14 NOTE — Patient Instructions (Signed)
You have a viral syndrome which is causing bronchitis.    post nasal drip may also be contributing to  your  Cough and laryngitis.  I am prescribing a prednisone taper to manage the inflammation in your bronchial tubes and your vocal cord   Tessalon cough capsules one every 8 hours as needed  Tussionex (has hydrocodone in it) for nighttime cough. It will make you droswy and may cause constipation   Your back will feel better for a week !  I recommend using [facility]'s Outpatient PT program to manage your back pain  You  Can continue tramadol at bedtime AFTER YOU FINISH THE COUGH SYRUP

## 2014-07-14 NOTE — Progress Notes (Signed)
Subjective:  Patient ID: Carrie Noble, female    DOB: [birth year]  Age: 74 y.o. MRN: [id number]  CC: The primary encounter diagnosis was Viral URI with cough. Diagnoses of Laryngitis, acute, Pain in right hip, Palpitations, and Bruit of right carotid artery were also pertinent to this visit.  HPI Carrie Noble presents for signs and symptoms of common cold with laryngitis.  Symptoms started on Thursday, four days ago,  after sinigng in the church choir and over the last several days has lost her voice. Denies fevers and facial pain . She is Using otc  Meds. Productive cough with yellow to greenish sputum.    2) Chronic Right hip pain ,  Received steroid injection by Dr Gavin Potters. He also recommended PT but she has postponed due to podiatry issues. Pain is mild to moderate,  Worse when lying down, Does not radiate.  No falls.   3) Carotid bruit:  Hear by [employer] RN during recent visit.    Outpatient Prescriptions Prior to Visit  Medication Sig Dispense Refill  . Biotin 1000 MCG tablet Take 1,000 mcg by mouth 3 (three) times daily.    . Calcium Carbonate (CALCIUM 600 PO) Take by mouth daily.    . Cholecalciferol (VITAMIN D PO) Take by mouth daily.    Marland Kitchen omeprazole (PRILOSEC) 20 MG capsule Take 1 capsule (20 mg total) by mouth 2 (two) times daily. (Patient taking differently: Take 20 mg by mouth daily. ) 180 capsule 1  . oxybutynin (DITROPAN-XL) 5 MG 24 hr tablet Take 1 tablet (5 mg total) by mouth at bedtime. 30 tablet 1  . vitamin B-12 (CYANOCOBALAMIN) 1000 MCG tablet Take 1,000 mcg by mouth daily.    . ranitidine (ZANTAC) 150 MG tablet Take 150 mg by mouth at bedtime.     No facility-administered medications prior to visit.    Review of Systems;  Patient denies headache, fevers, malaise, unintentional weight loss, skin rash, eye pain, sinus congestion and sinus pain, sore throat, dysphagia,  hemoptysis , cough, dyspnea, wheezing, chest pain, palpitations, orthopnea, edema,  abdominal pain, nausea, melena, diarrhea, constipation, flank pain, dysuria, hematuria, urinary  Frequency, nocturia, numbness, tingling, seizures,  Focal weakness, Loss of consciousness,  Tremor, insomnia, depression, anxiety, and suicidal ideation.      Objective:  BP 118/68 mmHg  Pulse 82  Temp(Src) 98 F (36.7 C) (Oral)  Resp 16  Ht  (1.626 m)  Wt 137 lb 4 oz (62.256 kg)  BMI 23.55 kg/m2  SpO2 96%  BP Readings from Last 3 Encounters:  07/14/14 118/68  03/25/14 110/70  02/04/14 116/66    Wt Readings from Last 3 Encounters:  07/14/14 137 lb 4 oz (62.256 kg)  03/25/14 141 lb 6 oz (64.127 kg)  12/23/13 142 lb 4 oz (64.524 kg)    General appearance: alert, cooperative and appears stated age Ears: normal TM's and external ear canals both ears Throat: lips, mucosa, and tongue normal; teeth and gums normal Neck: no adenopathy, no carotid bruit, supple, symmetrical, trachea midline and thyroid not enlarged, symmetric, no tenderness/mass/nodules Back: symmetric, no curvature. ROM normal. No CVA tenderness. Lungs: clear to auscultation bilaterally Heart: regular rate and rhythm, S1, S2 normal, no murmur, click, rub or gallop Abdomen: soft, non-tender; bowel sounds normal; no masses,  no organomegaly Pulses: 2+ and symmetric Skin: Skin color, texture, turgor normal. No rashes or lesions Lymph nodes: Cervical, supraclavicular, and axillary nodes normal.  Lab Results  Component Value Date   HGBA1C  5.7 03/29/2013   HGBA1C 5.7 11/27/2012    Lab Results  Component Value Date   CREATININE 0.81 04/28/2014   CREATININE 1.02 03/21/2014   CREATININE 0.8 11/07/2013    Lab Results  Component Value Date   WBC 5.4 11/07/2013   HGB 12.8 11/07/2013   HCT 38.3 11/07/2013   PLT 339.0 11/07/2013   GLUCOSE 86 04/28/2014   CHOL 253* 03/21/2014   TRIG 131.0 03/21/2014   HDL 57.90 03/21/2014   LDLDIRECT 152.2 11/07/2013   LDLCALC 169* 03/21/2014   ALT 20 04/28/2014   AST 21  04/28/2014   NA 136 04/28/2014   K 4.7 04/28/2014   CL 102 04/28/2014   CREATININE 0.81 04/28/2014   BUN 16 04/28/2014   CO2 27 04/28/2014   TSH 1.05 11/07/2013   HGBA1C 5.7 03/29/2013    No results found.  Assessment & Plan:   Problem List Items Addressed This Visit    Palpitations    She has received a steroid injection by Dr Gavin Potters and has persistent mild pain aggravated by supine position.  Recommended continued use of tramadol and start PT      Viral URI with cough - Primary    Predniosne taper, tussionex for nocturnal cough and tessalon fr daytime cough      Laryngitis, acute    Voice rest,  Cough suppression and prednisone      Pain in right hip   Carotid bruit    The bruit is intermittent,  She has hyperlipidemia.  Carotid ultrasounds ordered      Relevant Orders   US Carotid Duplex Bilateral      I am having Carrie Noble start on benzonatate, chlorpheniramine-HYDROcodone, and predniSONE. I am also having her maintain her Cholecalciferol (VITAMIN D PO), Calcium Carbonate (CALCIUM 600 PO), vitamin B-12, Biotin, ranitidine, oxybutynin, omeprazole, and traMADol.  Meds ordered this encounter  Medications  . traMADol (ULTRAM) 50 MG tablet    Sig: Take 50 mg by mouth 2 (two) times daily as needed. for pain    Refill:  0  . benzonatate (TESSALON) 200 MG capsule    Sig: Take 1 capsule (200 mg total) by mouth 3 (three) times daily as needed for cough.    Dispense:  60 capsule    Refill:  1  . chlorpheniramine-HYDROcodone (TUSSIONEX PENNKINETIC ER) 10-8 MG/5ML SUER    Sig: Take 5 mLs by mouth at bedtime as needed for cough.    Dispense:  140 mL    Refill:  0  . predniSONE (DELTASONE) 10 MG tablet    Sig: 6 tablets on Day 1 , then reduce by 1 tablet daily until gone    Dispense:  21 tablet    Refill:  0    There are no discontinued medications.  Follow-up: No Follow-up on file.   Sherlene Shams, MD

## 2014-07-14 NOTE — Assessment & Plan Note (Signed)
She has received a steroid injection by Dr Gavin Potters and has persistent mild pain aggravated by supine position.  Recommended continued use of tramadol and start PT

## 2014-07-21 ENCOUNTER — Telehealth: Payer: Self-pay | Admitting: *Deleted

## 2014-07-21 NOTE — Telephone Encounter (Signed)
Spoke with pt, she states she does not want to be seen tomorrow.  She is going to try an OTC antihistamine as suggested by the pharmacist.  She will try this for a few days.

## 2014-07-21 NOTE — Telephone Encounter (Signed)
If persistent symptoms, needs to be reevaluated.  I can work her in tomorrow if needed - 12:15.

## 2014-07-21 NOTE — Telephone Encounter (Signed)
Ok.  Just let us know if feel needs to be seen.

## 2014-07-21 NOTE — Telephone Encounter (Signed)
Pt called states she was seen on 6.20.16 for URI given prednisone.  Pt states she continues to have congestion and cough.  Please advise

## 2014-07-25 ENCOUNTER — Ambulatory Visit (INDEPENDENT_AMBULATORY_CARE_PROVIDER_SITE_OTHER): Payer: Medicare Other | Admitting: Internal Medicine

## 2014-07-25 ENCOUNTER — Encounter: Payer: Self-pay | Admitting: Internal Medicine

## 2014-07-25 VITALS — BP 120/70 | HR 64 | Temp 97.9°F | Ht 64.0 in | Wt 141.0 lb

## 2014-07-25 DIAGNOSIS — M25551 Pain in right hip: Secondary | ICD-10-CM

## 2014-07-25 DIAGNOSIS — B9789 Other viral agents as the cause of diseases classified elsewhere: Principal | ICD-10-CM

## 2014-07-25 DIAGNOSIS — G4710 Hypersomnia, unspecified: Secondary | ICD-10-CM

## 2014-07-25 DIAGNOSIS — K2270 Barrett's esophagus without dysplasia: Secondary | ICD-10-CM

## 2014-07-25 DIAGNOSIS — R928 Other abnormal and inconclusive findings on diagnostic imaging of breast: Secondary | ICD-10-CM

## 2014-07-25 DIAGNOSIS — R0683 Snoring: Secondary | ICD-10-CM

## 2014-07-25 DIAGNOSIS — R400 Somnolence: Secondary | ICD-10-CM

## 2014-07-25 DIAGNOSIS — E785 Hyperlipidemia, unspecified: Secondary | ICD-10-CM

## 2014-07-25 DIAGNOSIS — K5730 Diverticulosis of large intestine without perforation or abscess without bleeding: Secondary | ICD-10-CM | POA: Diagnosis not present

## 2014-07-25 DIAGNOSIS — E049 Nontoxic goiter, unspecified: Secondary | ICD-10-CM

## 2014-07-25 DIAGNOSIS — K219 Gastro-esophageal reflux disease without esophagitis: Secondary | ICD-10-CM | POA: Diagnosis not present

## 2014-07-25 DIAGNOSIS — Z0000 Encounter for general adult medical examination without abnormal findings: Secondary | ICD-10-CM

## 2014-07-25 DIAGNOSIS — J069 Acute upper respiratory infection, unspecified: Secondary | ICD-10-CM | POA: Diagnosis not present

## 2014-07-25 DIAGNOSIS — G479 Sleep disorder, unspecified: Secondary | ICD-10-CM

## 2014-07-25 NOTE — Progress Notes (Signed)
Patient ID: Carrie Noble, female   DOB: Nov 19, 1940, 74 y.o.   MRN: [id number]   Subjective:    Patient ID: Carrie Noble, female    DOB: Jun 13, 1940, 74 y.o.   MRN: [id number]  HPI  Patient here for a scheduled follow up.  Was having increased cough and congestion.  Saw Dr Darrick Huntsman on 07/14/14.  Was placed on prednisone. Cough is better.  No sob.  Reports increased fatigue.  Does not feel rested when she wakes up in the am.  Does snore.  Sometimes wakes herself - gasping.  Acid reflux controlled if she takes her PPI.  Had injection in her right hip (Dr Gavin Potters).  Is better.  No cardiac symptoms with increased activity or exertion.     Past Medical History  Diagnosis Date  . Hyperlipidemia   . Gastroesophageal reflux   . Hiatal hernia 1989    status post Nissen fundoplication   . Hx of hysterectomy   . Tachycardia     Patient stated that this has been occuring frequently.  . Arthritis   . Allergy   . Thyroid disease     Goiter    Current Outpatient Prescriptions on File Prior to Visit  Medication Sig Dispense Refill  . Biotin 1000 MCG tablet Take 1,000 mcg by mouth 3 (three) times daily.    . Calcium Carbonate (CALCIUM 600 PO) Take by mouth daily.    . Cholecalciferol (VITAMIN D PO) Take by mouth daily.    Marland Kitchen omeprazole (PRILOSEC) 20 MG capsule Take 1 capsule (20 mg total) by mouth 2 (two) times daily. (Patient taking differently: Take 20 mg by mouth daily. ) 180 capsule 1  . oxybutynin (DITROPAN-XL) 5 MG 24 hr tablet Take 1 tablet (5 mg total) by mouth at bedtime. 30 tablet 1  . ranitidine (ZANTAC) 150 MG tablet Take 150 mg by mouth at bedtime.    . traMADol (ULTRAM) 50 MG tablet Take 50 mg by mouth 2 (two) times daily as needed. for pain  0  . vitamin B-12 (CYANOCOBALAMIN) 1000 MCG tablet Take 1,000 mcg by mouth daily.    . benzonatate (TESSALON) 200 MG capsule Take 1 capsule (200 mg total) by mouth 3 (three) times daily as needed for cough. (Patient not taking: Reported on 07/25/2014) 60  capsule 1  . chlorpheniramine-HYDROcodone (TUSSIONEX PENNKINETIC ER) 10-8 MG/5ML SUER Take 5 mLs by mouth at bedtime as needed for cough. (Patient not taking: Reported on 07/25/2014) 140 mL 0   No current facility-administered medications on file prior to visit.    Review of Systems  Constitutional: Positive for fatigue. Negative for appetite change and unexpected weight change.  HENT: Negative for congestion and sinus pressure.   Respiratory: Positive for cough (is better. ). Negative for chest tightness and shortness of breath.   Cardiovascular: Negative for chest pain, palpitations and leg swelling.  Gastrointestinal: Negative for nausea, vomiting, abdominal pain and diarrhea.  Genitourinary: Negative for dysuria and difficulty urinating.  Musculoskeletal:       Hip pain - better s/p injection.   Skin: Negative for color change and rash.  Neurological: Negative for dizziness, light-headedness and headaches.  Psychiatric/Behavioral: Negative for dysphoric mood and agitation.       Objective:    Physical Exam  Constitutional: She appears well-developed and well-nourished.  HENT:  Nose: Nose normal.  Mouth/Throat: Oropharynx is clear and moist.  Neck: Neck supple. No thyromegaly present.  Cardiovascular: Normal rate and regular rhythm.   Pulmonary/Chest: Breath sounds  normal. No respiratory distress. She has no wheezes.  Abdominal: Soft. Bowel sounds are normal. There is no tenderness.  Musculoskeletal: She exhibits no edema or tenderness.  Lymphadenopathy:    She has no cervical adenopathy.  Skin: No rash noted. No erythema.  Psychiatric: She has a normal mood and affect. Her behavior is normal.    BP 120/70 mmHg  Pulse 64  Temp(Src) 97.9 F (36.6 C) (Oral)  Ht 5\' 4"  (1.626 m)  Wt 141 lb (63.957 kg)  BMI 24.19 kg/m2  SpO2 98% Wt Readings from Last 3 Encounters:  07/25/14 141 lb (63.957 kg)  07/14/14 137 lb 4 oz (62.256 kg)  03/25/14 141 lb 6 oz (64.127 kg)     Lab  Results  Component Value Date   WBC 5.4 11/07/2013   HGB 12.8 11/07/2013   HCT 38.3 11/07/2013   PLT 339.0 11/07/2013   GLUCOSE 86 04/28/2014   CHOL 253* 03/21/2014   TRIG 131.0 03/21/2014   HDL 57.90 03/21/2014   LDLDIRECT 152.2 11/07/2013   LDLCALC 169* 03/21/2014   ALT 20 04/28/2014   AST 21 04/28/2014   NA 136 04/28/2014   K 4.7 04/28/2014   CL 102 04/28/2014   CREATININE 0.81 04/28/2014   BUN 16 04/28/2014   CO2 27 04/28/2014   TSH 1.05 11/07/2013   HGBA1C 5.7 03/29/2013       Assessment & Plan:   Problem List Items Addressed This Visit    Abnormal mammogram    S/p biopsy - negative.  Mammogram 12/03/13 - Birads II.        Barrett's esophagus    EGD as outlined in overview.  Saw GI.  Refer to their note for details.  Continue on omeprazole.  Follow.        Difficulty sleeping    Reports daytime somnolence, snoring and episodes waking and gasping.  Given these symptoms, will check split night sleep study.         Relevant Orders   Ambulatory referral to Sleep Studies   Diverticulosis    Colonoscopy 02/2013 as outlined.  Recommended f/u in 10 years.        GERD (gastroesophageal reflux disease)    Continue prilosec bid for now.  Follow.       Relevant Orders   Basic metabolic panel   Health care maintenance    Physical 11/07/13.  Mammogram 12/03/13 - Birads II.  Colonoscopy 02/2013.  Recommend f/u colonoscopy in 10 years.        Hyperlipidemia    Low cholesterol diet and exercise.  Follow lipid panel.       Relevant Orders   Lipid panel   Hepatic function panel   Pain in right hip    S/p injection.  Seeing Dr [facility].        Thyroid goiter    Right thyroid fullness.  Sees Dr Renae FicklePaul.        Viral URI with cough - Primary    Better after prednisone.  Cough is better.  Follow.        Other Visit Diagnoses    Daytime somnolence        Relevant Orders    Ambulatory referral to Sleep Studies    Snoring        Relevant Orders    Ambulatory  referral to Sleep Studies      I spent 25 minutes with the patient and more than 50% of the time was spent in consultation regarding the above.  Ravinder Lukehart, MD   

## 2014-07-25 NOTE — Progress Notes (Signed)
Pre visit review using our clinic review tool, if applicable. No additional management support is needed unless otherwise documented below in the visit note. 

## 2014-07-27 ENCOUNTER — Encounter: Payer: Self-pay | Admitting: Internal Medicine

## 2014-07-27 NOTE — Assessment & Plan Note (Signed)
EGD as outlined in overview.  Saw GI.  Refer to their note for details.  Continue on omeprazole.  Follow.

## 2014-07-27 NOTE — Assessment & Plan Note (Signed)
S/p injection.  Seeing Dr Gavin PottersKernodle.

## 2014-07-27 NOTE — Assessment & Plan Note (Signed)
Better after prednisone.  Cough is better.  Follow.

## 2014-07-27 NOTE — Assessment & Plan Note (Signed)
S/p biopsy - negative.  Mammogram 12/03/13 - Birads II.

## 2014-07-27 NOTE — Assessment & Plan Note (Signed)
Low cholesterol diet and exercise.  Follow lipid panel.   

## 2014-07-27 NOTE — Assessment & Plan Note (Signed)
Continue prilosec bid for now.  Follow.

## 2014-07-27 NOTE — Assessment & Plan Note (Signed)
Right thyroid fullness.  Sees Dr Renae FicklePaul.

## 2014-07-27 NOTE — Assessment & Plan Note (Signed)
Colonoscopy 02/2013 as outlined.  Recommended f/u in 10 years.

## 2014-07-27 NOTE — Assessment & Plan Note (Signed)
Reports daytime somnolence, snoring and episodes waking and gasping.  Given these symptoms, will check split night sleep study.

## 2014-07-27 NOTE — Assessment & Plan Note (Signed)
Physical 11/07/13.  Mammogram 12/03/13 - Birads II.  Colonoscopy 02/2013.  Recommend f/u colonoscopy in 10 years.

## 2014-09-08 ENCOUNTER — Encounter (INDEPENDENT_AMBULATORY_CARE_PROVIDER_SITE_OTHER): Payer: Self-pay

## 2014-09-08 ENCOUNTER — Ambulatory Visit (INDEPENDENT_AMBULATORY_CARE_PROVIDER_SITE_OTHER): Payer: Medicare Other | Admitting: Nurse Practitioner

## 2014-09-08 VITALS — BP 118/80 | HR 81 | Temp 98.4°F | Resp 14 | Ht 64.0 in | Wt 140.0 lb

## 2014-09-08 DIAGNOSIS — N644 Mastodynia: Secondary | ICD-10-CM | POA: Diagnosis not present

## 2014-09-08 NOTE — Progress Notes (Signed)
Pre visit review using our clinic review tool, if applicable. No additional management support is needed unless otherwise documented below in the visit note. 

## 2014-09-08 NOTE — Patient Instructions (Addendum)
We will contact you to set up your mammogram. [phone] is the number if you want to call and set it up.

## 2014-09-08 NOTE — Progress Notes (Signed)
Patient ID: Carrie Noble, female    DOB: [birth year]  Age: 74 y.o. MRN: [id number]  CC: Breast Pain   HPI DAIZEE FIRMIN presents for right breast pain.   1) Right breast soreness, stinging, burning pain from axilla to nipple, then she reported that it was apparent on both sides, but right is worse.   Mowed the yard today and didn't notice as much.   History Pihu has a past medical history of Hyperlipidemia; Gastroesophageal reflux; Hiatal hernia (1989); hysterectomy; Tachycardia; Arthritis; Allergy; and Thyroid disease.   She has past surgical history that includes Tonsillectomy; Laparoscopic Nissen fundoplication (1999); Abdominal hysterectomy (1980); Esophagogastric fundoplication (1999); Breast biopsy (Right); Breast biopsy (Left); Colonoscopy (2015); and Upper gi endoscopy (2015).   Her family history includes Arthritis in her mother; Cancer in her sister; Dementia in her sister; Diabetes in her brother; Heart disease in her mother and sister; Hypertension in her father and mother; Other in her sister; Rheumatic fever in her brother; Stroke in her brother, brother, and sister; Stroke (age of onset: 51) in her father; Stroke (age of onset: 53) in her mother.She reports that she has never smoked. She has never used smokeless tobacco. She reports that she does not drink alcohol or use illicit drugs.  Outpatient Prescriptions Prior to Visit  Medication Sig Dispense Refill  . Biotin 1000 MCG tablet Take 1,000 mcg by mouth 3 (three) times daily.    . Calcium Carbonate (CALCIUM 600 PO) Take by mouth daily.    . Cholecalciferol (VITAMIN D PO) Take by mouth daily.    Marland Kitchen omeprazole (PRILOSEC) 20 MG capsule Take 1 capsule (20 mg total) by mouth 2 (two) times daily. (Patient taking differently: Take 20 mg by mouth daily. ) 180 capsule 1  . oxybutynin (DITROPAN-XL) 5 MG 24 hr tablet Take 1 tablet (5 mg total) by mouth at bedtime. 30 tablet 1  . ranitidine (ZANTAC) 150 MG tablet Take 150 mg by mouth at  bedtime.    . traMADol (ULTRAM) 50 MG tablet Take 50 mg by mouth 2 (two) times daily as needed. for pain  0  . vitamin B-12 (CYANOCOBALAMIN) 1000 MCG tablet Take 1,000 mcg by mouth daily.    . benzonatate (TESSALON) 200 MG capsule Take 1 capsule (200 mg total) by mouth 3 (three) times daily as needed for cough. (Patient not taking: Reported on 09/08/2014) 60 capsule 1  . chlorpheniramine-HYDROcodone (TUSSIONEX PENNKINETIC ER) 10-8 MG/5ML SUER Take 5 mLs by mouth at bedtime as needed for cough. (Patient not taking: Reported on 09/08/2014) 140 mL 0   No facility-administered medications prior to visit.    ROS Review of Systems  Constitutional: Negative for fever, chills, diaphoresis and fatigue.  Respiratory: Negative for chest tightness, shortness of breath and wheezing.   Cardiovascular: Negative for chest pain, palpitations and leg swelling.  Gastrointestinal: Negative for nausea, vomiting and diarrhea.  Skin: Negative for color change, pallor, rash and wound.  Psychiatric/Behavioral: The patient is not nervous/anxious.     Objective:  BP 118/80 mmHg  Pulse 81  Temp(Src) 98.4 F (36.9 C)  Resp 14  Ht 5\' 4"  (1.626 m)  Wt 140 lb (63.504 kg)  BMI 24.02 kg/m2  SpO2 95%  Physical Exam  Constitutional: She is oriented to person, place, and time. She appears well-developed and well-nourished. No distress.  HENT:  Head: Normocephalic and atraumatic.  Right Ear: External ear normal.  Left Ear: External ear normal.  Pulmonary/Chest: Effort normal and breath sounds normal. No respiratory  distress. She has no wheezes. She has no rales. She exhibits no tenderness.    Areas of tenderness outlined in red  Neurological: She is alert and oriented to person, place, and time. No cranial nerve deficit. She exhibits normal muscle tone. Coordination normal.  Skin: Skin is warm and dry. No rash noted. She is not diaphoretic.  Psychiatric: She has a normal mood and affect. Her behavior is normal.  Judgment and thought content normal.      Assessment & Plan:   Gorgeous was seen today for breast pain.  Diagnoses and all orders for this visit:  Breast tenderness in female -     MM Digital Diagnostic Bilat; Future -     US BREAST COMPLETE UNI LEFT INC AXILLA; Future -     US BREAST COMPLETE UNI RIGHT INC AXILLA; Future  Breast pain  I am having Ms. Fiallo maintain her Cholecalciferol (VITAMIN D PO), Calcium Carbonate (CALCIUM 600 PO), vitamin B-12, Biotin, ranitidine, oxybutynin, omeprazole, traMADol, benzonatate, and chlorpheniramine-HYDROcodone.  No orders of the defined types were placed in this encounter.     Follow-up: Return if symptoms worsen or fail to improve.

## 2014-09-09 ENCOUNTER — Other Ambulatory Visit: Payer: Self-pay | Admitting: Nurse Practitioner

## 2014-09-09 DIAGNOSIS — N644 Mastodynia: Secondary | ICD-10-CM

## 2014-09-12 ENCOUNTER — Other Ambulatory Visit: Payer: Medicare Other

## 2014-09-12 ENCOUNTER — Ambulatory Visit: Payer: Medicare Other

## 2014-09-14 ENCOUNTER — Encounter: Payer: Self-pay | Admitting: Nurse Practitioner

## 2014-09-14 DIAGNOSIS — N644 Mastodynia: Secondary | ICD-10-CM | POA: Insufficient documentation

## 2014-09-14 NOTE — Assessment & Plan Note (Signed)
Pt is concerned about breast tenderness. She is postmenopausal (surgically). I discussed possible etiologies with pt and let her know my doubt of breast cancer due to only tenderness on exam (especially in this distribution). She is adamant about having a mammogram/US to put her mind at ease. Orders placed. Will follow. 

## 2014-09-14 NOTE — Assessment & Plan Note (Deleted)
Pt is concerned about breast tenderness. She is postmenopausal (surgically). I discussed possible etiologies with pt and let her know my doubt of breast cancer due to only tenderness on exam (especially in this distribution). She is adamant about having a mammogram/US to put her mind at ease. Orders placed. Will follow.

## 2014-09-15 ENCOUNTER — Ambulatory Visit
Admission: RE | Admit: 2014-09-15 | Discharge: 2014-09-15 | Disposition: A | Discharge status: Home / Self Care | Payer: Medicare Other | Source: Ambulatory Visit | Attending: Nurse Practitioner | Admitting: Nurse Practitioner

## 2014-09-15 ENCOUNTER — Ambulatory Visit: Payer: Medicare Other

## 2014-09-15 ENCOUNTER — Other Ambulatory Visit: Payer: Self-pay | Admitting: Nurse Practitioner

## 2014-09-15 DIAGNOSIS — N644 Mastodynia: Secondary | ICD-10-CM

## 2014-10-06 ENCOUNTER — Ambulatory Visit: Payer: Medicare Other | Attending: Otolaryngology | Admitting: Speech Pathology

## 2014-10-06 ENCOUNTER — Encounter: Payer: Self-pay | Admitting: Speech Pathology

## 2014-10-06 DIAGNOSIS — R490 Dysphonia: Secondary | ICD-10-CM | POA: Insufficient documentation

## 2014-10-07 NOTE — Therapy (Signed)
[facility] MAIN [facility] SERVICES [address] [place], [place], [address] Phone: [phone]   Fax:  [phone]  Speech Language Pathology Evaluation  Patient Details  Name: Carrie Noble MRN: [id number] Date of Birth: 1940-06-12 Referring Provider:  Bud Face, MD  Encounter Date: 10/06/2014      End of Session - 10/07/14 1020    Visit Number 1   Number of Visits 17   Date for SLP Re-Evaluation 12/07/14   SLP Start Time 1500   SLP Stop Time  1550   SLP Time Calculation (min) 50 min   Activity Tolerance Patient tolerated treatment well      Past Medical History  Diagnosis Date   Hyperlipidemia    Gastroesophageal reflux    Hiatal hernia 1989    status post Nissen fundoplication    Hx of hysterectomy    Tachycardia     Patient stated that this has been occuring frequently.   Arthritis    Allergy    Thyroid disease     Goiter    Past Surgical History  Procedure Laterality Date   Tonsillectomy      as well as goiter   Laparoscopic nissen fundoplication  1999   Abdominal hysterectomy  1980    partial   Esophagogastric fundoplication  1999   Colonoscopy  2015   Upper gi endoscopy  2015   Breast biopsy Right     neg   Breast excisional biopsy Left 2014    neg    There were no vitals filed for this visit.  Visit Diagnosis: Dysphonia - Plan: SLP plan of care cert/re-cert      Subjective Assessment - 10/07/14 1018    Subjective The patient reports ongoing hoarseness following episode of laryngitis.  She has been evaluated by Dr. 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   Currently in Pain? No/denies       Perceptual Voice Evaluation  Voice checklist:  Health risks: GERD, poor water intake at times  Characteristic voice use: Sings with chorus  Environmental risks: Sensitive to smells  Misuse: No vocal warm up routine  Abuse: Frequent throat clearing  Vocal  characteristics: Limited pitch range, hoarse, strained, lower habitual pitch  Patient Quality of Life Survey: Voice Handicap Index-10 Score of 4  A score of 10 or higher indicates perceived handicap  Maximum phonation time for sustained ah: 25 seconds  Average fundamental frequency during sustained ah: 192 Hz (1.9 STD below average for age and gender  Average time patient was able to sustain /s/: 12 seconds  Average time patient was able to sustain /z/: 11 seconds  s/z ratio : 1.1  Highest dynamic pitch when altering pitch from a low note to a high note: 829 Hz  Highest pitch during conversational speech: 744 Hz  Lowest dynamic pitch when altering from a high note to a low note: 193 Hz  Lowest pitch during conversational speech: 210 Hz  Visi-Pitch: Multi-Dimensional Voice Program (MDVP)  MDVP extracts objective quantitative values (Relative Average Perturbation, Shimmer, Voice Turbulence Index, and Noise to Harmonic Ratio) on sustained phonation, which are displayed graphically and numerically in comparison to a built-in normative database.  The patient exhibited values outside the norm for Relative Average Perturbation, Shimmer, Voice Turbulence Index, and Noise to Harmonic Ratio.  Average fundamental frequency was 1.8 STD below the average for age and gender. The patient improved all parameters when cued to alter voicing (higher pitch, like me).  SLP Education - 2014/10/29 1530    Education provided Yes   Education Details Physiology of voicing   Person(s) Educated Patient   Methods Explanation   Comprehension Verbalized understanding            SLP Long Term Goals - 2014/10/29 1536    SLP LONG TERM GOAL #1   Title The patient will demonstrate independent understanding of vocal hygiene concepts and neck, shoulder, lingual stretching exercises.   Time 8   Period Weeks   Status New   SLP LONG TERM GOAL #2   Title The patient will be independent for  abdominal breathing and breath support exercises.   Time 8   Period Weeks   Status New   SLP LONG TERM GOAL #3   Title The patient will maximize voice quality and loudness using breath support for sustained vowel production, pitch glides, and hierarchal speech drill.   Time 8   Period Weeks   Status New   SLP LONG TERM GOAL #4   Title The patient will maximize voice quality and loudness using breath support for paragraph length recitation with 80% accuracy.   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   The patient demonstrates hoarse vocal quality, habitual use of low pitch, reduced pitch range, reduced breath control for speech, strained/tense phonation, and laryngeal tension.  Stimulability testing showed the patient is able to generate a stronger, better quality voice given cues and SLP model.  She will benefit from voice therapy for vocal hygiene education, to reduce hoarseness, to reduce intrinsic and extrinsic laryngeal tension, to improve breath control/support for speech, to improve tone focus, promote easy flow phonation, and learn techniques to increase loudness and pitch range without strain.   Speech Therapy Frequency 2x / week   Duration Other (comment)  8 weeks   Treatment/Interventions Other (comment)  Voice therapy   Potential to Achieve Goals Good   Potential Considerations Ability to learn/carryover information;Co-morbidities;Cooperation/participation level;Previous level of function;Severity of impairments;Family/community support   SLP Home Exercise Plan To be established   Consulted and Agree with Plan of Care Patient          G-Codes - 10/29/2014 1606    Functional Assessment Tool Used Perceptual Voice Evaluation   Functional Limitations Voice   Voice Current Status (G9171) At least 40  percent but less than 60 percent impaired, limited or restricted   Voice Goal Status (G9172) At least 1 percent but less than 20 percent impaired, limited or restricted      Problem List Patient Active Problem List   Diagnosis Date Noted   Breast tenderness in female 09/14/2014   Viral URI with cough 07/14/2014   Laryngitis, acute 07/14/2014   Pain in right hip 07/14/2014   Carotid bruit 07/14/2014   Difficulty sleeping 03/30/2014   Health care maintenance 03/30/2014   Ankle fracture, left 12/29/2013   Urinary frequency 12/29/2013   Diverticulosis 03/27/2013   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 [place], [place], [address] Phone: [phone]   Fax:  [phone]

## 2014-10-09 ENCOUNTER — Ambulatory Visit: Payer: Medicare Other | Admitting: Speech Pathology

## 2014-10-09 DIAGNOSIS — R490 Dysphonia: Secondary | ICD-10-CM

## 2014-10-10 ENCOUNTER — Encounter: Payer: Self-pay | Admitting: Speech Pathology

## 2014-10-10 NOTE — Therapy (Signed)
[place] [facility] MAIN [facility] SERVICES [address] [place], [place], [address] Phone: [phone]   Fax:  [phone]  Speech Language Pathology Treatment  Patient Details  Name: Carrie Noble MRN: [id number] Date of Birth: 1940/09/04 Referring Provider:  Bud Face, MD  Encounter Date: 10/09/2014      End of Session - 10/10/14 1336    Visit Number 2   Number of Visits 17   Date for SLP Re-Evaluation 12/07/14   SLP Start Time 1500   SLP Stop Time  1550   SLP Time Calculation (min) 50 min   Activity Tolerance Patient tolerated treatment well      Past Medical History  Diagnosis Date  . Hyperlipidemia   . Gastroesophageal reflux   . Hiatal hernia 1989    status post Nissen fundoplication   . Hx of hysterectomy   . Tachycardia     Patient stated that this has been occuring frequently.  . Arthritis   . Allergy   . Thyroid disease     Goiter    Past Surgical History  Procedure Laterality Date  . Tonsillectomy      as well as goiter  . Laparoscopic nissen fundoplication  1999  . Abdominal hysterectomy  1980    partial  . Esophagogastric fundoplication  1999  . Colonoscopy  2015  . Upper gi endoscopy  2015  . Breast biopsy Right     neg  . Breast excisional biopsy Left 2014    neg    There were no vitals filed for this visit.  Visit Diagnosis: Dysphonia      Subjective Assessment - 10/10/14 1336    Subjective Patient appreciates physiological explanation of voicing and how to maximize vocal quality.    Currently in Pain? No/denies               ADULT SLP TREATMENT - 10/10/14 0001    General Information   Behavior/Cognition Alert;Cooperative;Pleasant mood   Treatment Provided   Treatment provided Cognitive-Linquistic   Cognitive-Linquistic Treatment   Treatment focused on Voice   Skilled Treatment The patient was provided with written and verbal teaching regarding neck and shoulder relaxation exercises to  promote relaxed phonation.  Patient was provided with verbal and written instruction in exercises for Muscle Tension Dysphonia.  Patient demonstrates accurate execution of exercises.  Patient instructed in relaxed phonation / oral resonance. The patient was provided with written and verbal teaching regarding breath support exercises.  She benefits from cues to expand rim cage vs. watch for stomach rise.  Patient able to generate clear vocal quality with hum.  She is able to sense and feel correct vs. incorrect production.  Patient inconsistently able to maintain vocal clarity into vowel in hum + vowel syllable.   Assessment / Recommendations / Plan   Plan Continue with current plan of care   Progression Toward Goals   Progression toward goals Progressing toward goals          SLP Education - 10/10/14 1336    Education provided Yes   Education Details oral resonance, breath support, neck and tongue stretches   Person(s) Educated Patient   Methods Explanation;Demonstration;Verbal cues;Handout   Comprehension Verbalized understanding;Returned demonstration;Need further instruction            SLP Long Term Goals - 10/06/14 1536    SLP LONG TERM GOAL #1   Title The patient will demonstrate independent understanding of vocal hygiene concepts and neck, shoulder, lingual stretching exercises.  Time 8   Period Weeks   Status New   SLP LONG TERM GOAL #2   Title The patient will be independent for abdominal breathing and breath support exercises.   Time 8   Period Weeks   Status New   SLP LONG TERM GOAL #3   Title The patient will maximize voice quality and loudness using breath support for sustained vowel production, pitch glides, and hierarchal speech drill.   Time 8   Period Weeks   Status New   SLP LONG TERM GOAL #4   Title The patient will maximize voice quality and loudness using breath support for paragraph length recitation with 80% accuracy.   Time 8   Period Weeks   Status  New          Plan - 10/10/14 1337    Clinical Impression Statement The patient is able to generate a clear vocal quality using hum to generate oral resonance.   Speech Therapy Frequency 2x / week   Duration Other (comment)   Treatment/Interventions Other (comment)  voice therapy   Potential to Achieve Goals Good   Potential Considerations Ability to learn/carryover information;Co-morbidities;Cooperation/participation level;Previous level of function;Severity of impairments;Family/community support   SLP Home Exercise Plan Neck and tongue streteches, breth support exercises, hum to achieve oral resonance   Consulted and Agree with Plan of Care Patient        Problem List Patient Active Problem List   Diagnosis Date Noted  . Breast tenderness in female 09/14/2014  . Viral URI with cough 07/14/2014  . Laryngitis, acute 07/14/2014  . Pain in right hip 07/14/2014  . Carotid bruit 07/14/2014  . Difficulty sleeping 03/30/2014  . Health care maintenance 03/30/2014  . Ankle fracture, left 12/29/2013  . Urinary frequency 12/29/2013  . Diverticulosis 03/27/2013  . 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 Abnormal mammogram 09/06/2012  . Osteoporosis 05/29/2012  . Thyroid goiter 05/28/2012  . Barrett's esophagus 05/28/2012  . Hyperlipidemia 01/03/2011  . Palpitations 01/03/2011  . GERD (gastroesophageal reflux disease) 01/03/2011   Dollene Primrose, MS/CCC- SLP  Leandrew Koyanagi 10/10/2014, 1:39 PM  [facility] MAIN [facility] SERVICES [address] [place], [place], [address] Phone: [phone]   Fax:  [phone]

## 2014-10-13 ENCOUNTER — Ambulatory Visit: Payer: Medicare Other | Admitting: Speech Pathology

## 2014-10-13 ENCOUNTER — Encounter: Payer: Self-pay | Admitting: Speech Pathology

## 2014-10-13 DIAGNOSIS — R490 Dysphonia: Secondary | ICD-10-CM

## 2014-10-13 NOTE — Therapy (Signed)
[employer] [facility] MAIN [facility] SERVICES [address] [place], [place], [address] Phone: [phone]   Fax:  [phone]  Speech Language Pathology Treatment  Patient Details  Name: Carrie Noble MRN: [id number] Date of Birth: 05-18-1940 Referring Provider:  Bud Face, MD  Encounter Date: 10/13/2014      End of Session - 10/13/14 1554    Visit Number 3   Number of Visits 17   Date for SLP Re-Evaluation 12/07/14   SLP Start Time 1500   SLP Stop Time  1547   SLP Time Calculation (min) 47 min   Activity Tolerance Patient tolerated treatment well      Past Medical History  Diagnosis Date  . Hyperlipidemia   . Gastroesophageal reflux   . Hiatal hernia 1989    status post Nissen fundoplication   . Hx of hysterectomy   . Tachycardia     Patient stated that this has been occuring frequently.  . Arthritis   . Allergy   . Thyroid disease     Goiter    Past Surgical History  Procedure Laterality Date  . Tonsillectomy      as well as goiter  . Laparoscopic nissen fundoplication  1999  . Abdominal hysterectomy  1980    partial  . Esophagogastric fundoplication  1999  . Colonoscopy  2015  . Upper gi endoscopy  2015  . Breast biopsy Right     neg  . Breast excisional biopsy Left 2014    neg    There were no vitals filed for this visit.  Visit Diagnosis: Dysphonia             ADULT SLP TREATMENT - 10/13/14 0001    General Information   Behavior/Cognition Alert;Cooperative;Pleasant mood   Treatment Provided   Treatment provided Cognitive-Linquistic   Pain Assessment   Pain Assessment No/denies pain   Cognitive-Linquistic Treatment   Treatment focused on Voice   Skilled Treatment The patient was provided with written and verbal teaching regarding neck and shoulder relaxation exercises to promote relaxed phonation.  Patient was provided with verbal and written instruction in exercises for Muscle Tension Dysphonia.   Patient demonstrates accurate execution of exercises.  Patient instructed in relaxed phonation / oral resonance. The patient was provided with written and verbal teaching regarding breath support exercises.  She benefits from cues to expand rim cage vs. watch for stomach rise.  Patient able to generate clear vocal quality with hum.  She is able to sense and feel correct vs. incorrect production.  Patient inconsistently able to maintain vocal clarity into vowel in hum + vowel syllable.   Assessment / Recommendations / Plan   Plan Continue with current plan of care   Progression Toward Goals   Progression toward goals Progressing toward goals          SLP Education - 10/13/14 1553    Education provided Yes   Education Details vocal loudness, oral resonance, breath support, neck and tongue stretches   Person(s) Educated Patient   Methods Explanation;Demonstration;Verbal cues;Handout   Comprehension Verbalized understanding;Returned demonstration;Verbal cues required;Need further instruction            SLP Long Term Goals - 10/06/14 1536    SLP LONG TERM GOAL #1   Title The patient will demonstrate independent understanding of vocal hygiene concepts and neck, shoulder, lingual stretching exercises.   Time 8   Period Weeks   Status New   SLP LONG TERM GOAL #2  Title The patient will be independent for abdominal breathing and breath support exercises.   Time 8   Period Weeks   Status New   SLP LONG TERM GOAL #3   Title The patient will maximize voice quality and loudness using breath support for sustained vowel production, pitch glides, and hierarchal speech drill.   Time 8   Period Weeks   Status New   SLP LONG TERM GOAL #4   Title The patient will maximize voice quality and loudness using breath support for paragraph length recitation with 80% accuracy.   Time 8   Period Weeks   Status New          Plan - 10/13/14 1555    Clinical Impression Statement The patient is  able to generate a clear vocal quality using hum to generate oral resonance.   Speech Therapy Frequency 2x / week   Duration Other (comment)   Treatment/Interventions Other (comment)  Voice therapy   Potential to Achieve Goals Good   Potential Considerations Ability to learn/carryover information;Co-morbidities;Cooperation/participation level;Previous level of function;Severity of impairments;Family/community support   SLP Home Exercise Plan Neck and tongue streteches, breth support exercises, hum to achieve oral resonance   Consulted and Agree with Plan of Care Patient        Problem List Patient Active Problem List   Diagnosis Date Noted  . Breast tenderness in female 09/14/2014  . Viral URI with cough 07/14/2014  . Laryngitis, acute 07/14/2014  . Pain in right hip 07/14/2014  . Carotid bruit 07/14/2014  . Difficulty sleeping 03/30/2014  . Health care maintenance 03/30/2014  . Ankle fracture, left 12/29/2013  . Urinary frequency 12/29/2013  . Diverticulosis 03/27/2013  . 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 Abnormal mammogram 09/06/2012  . Osteoporosis 05/29/2012  . Thyroid goiter 05/28/2012  . Barrett's esophagus 05/28/2012  . Hyperlipidemia 01/03/2011  . Palpitations 01/03/2011  . GERD (gastroesophageal reflux disease) 01/03/2011   Dollene Primrose, MS/CCC- SLP  Leandrew Koyanagi 10/13/2014, 3:56 PM  [place] [facility] MAIN [facility] SERVICES [address] [place], [place], [address] Phone: [phone]   Fax:  [phone]

## 2014-10-14 ENCOUNTER — Ambulatory Visit: Payer: Medicare Other | Admitting: Speech Pathology

## 2014-10-16 ENCOUNTER — Encounter: Payer: Self-pay | Admitting: Speech Pathology

## 2014-10-16 ENCOUNTER — Ambulatory Visit: Payer: Medicare Other | Admitting: Speech Pathology

## 2014-10-16 DIAGNOSIS — R490 Dysphonia: Secondary | ICD-10-CM

## 2014-10-16 NOTE — Therapy (Signed)
[place] [facility] MAIN [facility] SERVICES [address] [place], [place], [address] Phone: [phone]   Fax:  [phone]  Speech Language Pathology Treatment  Patient Details  Name: Carrie Noble MRN: [id number] Date of Birth: 1940/05/01 Referring Provider:  Bud Face, MD  Encounter Date: 10/16/2014      End of Session - 10/16/14 1625    Visit Number 4   Number of Visits 17   Date for SLP Re-Evaluation 12/07/14   SLP Start Time 1510   SLP Stop Time  1558   SLP Time Calculation (min) 48 min   Activity Tolerance Patient tolerated treatment well      Past Medical History  Diagnosis Date  . Hyperlipidemia   . Gastroesophageal reflux   . Hiatal hernia 1989    status post Nissen fundoplication   . Hx of hysterectomy   . Tachycardia     Patient stated that this has been occuring frequently.  . Arthritis   . Allergy   . Thyroid disease     Goiter    Past Surgical History  Procedure Laterality Date  . Tonsillectomy      as well as goiter  . Laparoscopic nissen fundoplication  1999  . Abdominal hysterectomy  1980    partial  . Esophagogastric fundoplication  1999  . Colonoscopy  2015  . Upper gi endoscopy  2015  . Breast biopsy Right     neg  . Breast excisional biopsy Left 2014    neg    There were no vitals filed for this visit.  Visit Diagnosis: Dysphonia      Subjective Assessment - 10/16/14 1625    Subjective Patient reports that her voice is "some better".   Currently in Pain? No/denies               ADULT SLP TREATMENT - 10/16/14 0001    General Information   Behavior/Cognition Alert;Cooperative;Pleasant mood   Treatment Provided   Treatment provided Cognitive-Linquistic   Pain Assessment   Pain Assessment No/denies pain   Cognitive-Linquistic Treatment   Treatment focused on Voice   Skilled Treatment The patient was provided with written and verbal teaching regarding neck and shoulder relaxation  exercises to promote relaxed phonation.  Patient was provided with verbal and written instruction in exercises for Muscle Tension Dysphonia.  Patient demonstrates accurate execution of exercises.  Patient instructed in relaxed phonation / oral resonance. The patient was provided with written and verbal teaching regarding breath support exercises.  She benefits from cues to expand rim cage vs. watch for stomach rise.  Patient able to generate clear vocal quality with hum.  She is able to sense and feel correct vs. incorrect production.  Patient able to maintain vocal clarity with 100% accuracy in /m/+vowel syllables and 80% in reading phrases/sentences aloud.  Maintain vocal clarity in conversation only with max SLP cues.  Patient instructed in pitch glides for improving pitch range.    Assessment / Recommendations / Plan   Plan Continue with current plan of care   Progression Toward Goals   Progression toward goals Progressing toward goals          SLP Education - 10/16/14 1625    Education Details vocal loudness, oral resonance, breath support, neck and tongue stretches, pitch glides   Person(s) Educated Patient   Methods Explanation;Demonstration;Verbal cues   Comprehension Verbalized understanding;Returned demonstration;Verbal cues required;Need further instruction            SLP Long  Term Goals - 10/06/14 1536    SLP LONG TERM GOAL #1   Title The patient will demonstrate independent understanding of vocal hygiene concepts and neck, shoulder, lingual stretching exercises.   Time 8   Period Weeks   Status New   SLP LONG TERM GOAL #2   Title The patient will be independent for abdominal breathing and breath support exercises.   Time 8   Period Weeks   Status New   SLP LONG TERM GOAL #3   Title The patient will maximize voice quality and loudness using breath support for sustained vowel production, pitch glides, and hierarchal speech drill.   Time 8   Period Weeks   Status New    SLP LONG TERM GOAL #4   Title The patient will maximize voice quality and loudness using breath support for paragraph length recitation with 80% accuracy.   Time 8   Period Weeks   Status New          Plan - 10/16/14 1626    Clinical Impression Statement The patient is able to generate a clear vocal quality using nasality in combination with vocal loudness.  She is able to perform limited pitch glides with vocal clarity. She demonstrates emerging ability to detect and fix tense productions.   Speech Therapy Frequency 2x / week   Duration Other (comment)   Treatment/Interventions Other (comment)  voice therapy   Potential to Achieve Goals Good   Potential Considerations Ability to learn/carryover information;Co-morbidities;Cooperation/participation level;Previous level of function;Severity of impairments;Family/community support   SLP Home Exercise Plan Neck and tongue streteches, breth support exercises, hum to achieve oral resonance, pitch glides   Consulted and Agree with Plan of Care Patient        Problem List Patient Active Problem List   Diagnosis Date Noted  . Breast tenderness in female 09/14/2014  . Viral URI with cough 07/14/2014  . Laryngitis, acute 07/14/2014  . Pain in right hip 07/14/2014  . Carotid bruit 07/14/2014  . Difficulty sleeping 03/30/2014  . Health care maintenance 03/30/2014  . Ankle fracture, left 12/29/2013  . Urinary frequency 12/29/2013  . Diverticulosis 03/27/2013  . 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 Abnormal mammogram 09/06/2012  . Osteoporosis 05/29/2012  . Thyroid goiter 05/28/2012  . Barrett's esophagus 05/28/2012  . Hyperlipidemia 01/03/2011  . Palpitations 01/03/2011  . GERD (gastroesophageal reflux disease) 01/03/2011   Dollene Primrose, MS/CCC- SLP  Leandrew Koyanagi 10/16/2014, 4:27 PM  [place] [facility] MAIN [facility] SERVICES [address] [place], [place],  [address] Phone: [phone]   Fax:  [phone]

## 2014-10-21 ENCOUNTER — Ambulatory Visit: Payer: Medicare Other | Admitting: Speech Pathology

## 2014-10-21 DIAGNOSIS — R490 Dysphonia: Secondary | ICD-10-CM | POA: Diagnosis not present

## 2014-10-22 ENCOUNTER — Encounter: Payer: Self-pay | Admitting: Speech Pathology

## 2014-10-22 NOTE — Therapy (Signed)
[place] [facility] MAIN [facility] SERVICES [address] [place], [place], [address] Phone: [phone]   Fax:  [phone]  Speech Language Pathology Treatment  Patient Details  Name: Carrie Noble MRN: [id number] Date of Birth: 18-Dec-1940 Referring Provider:  Bud Face, MD  Encounter Date: 10/21/2014      End of Session - 10/22/14 1001    Visit Number 5   Number of Visits 17   Date for SLP Re-Evaluation 12/07/14   SLP Start Time 1602   SLP Stop Time  1651   SLP Time Calculation (min) 49 min   Activity Tolerance Patient tolerated treatment well      Past Medical History  Diagnosis Date  . Hyperlipidemia   . Gastroesophageal reflux   . Hiatal hernia 1989    status post Nissen fundoplication   . Hx of hysterectomy   . Tachycardia     Patient stated that this has been occuring frequently.  . Arthritis   . Allergy   . Thyroid disease     Goiter    Past Surgical History  Procedure Laterality Date  . Tonsillectomy      as well as goiter  . Laparoscopic nissen fundoplication  1999  . Abdominal hysterectomy  1980    partial  . Esophagogastric fundoplication  1999  . Colonoscopy  2015  . Upper gi endoscopy  2015  . Breast biopsy Right     neg  . Breast excisional biopsy Left 2014    neg    There were no vitals filed for this visit.  Visit Diagnosis: Dysphonia      Subjective Assessment - 10/22/14 1000    Subjective Patient reports that her voice is better "when I think about it".   Currently in Pain? No/denies               ADULT SLP TREATMENT - 10/22/14 0001    General Information   Behavior/Cognition Alert;Cooperative;Pleasant mood   Treatment Provided   Treatment provided Cognitive-Linquistic   Pain Assessment   Pain Assessment No/denies pain   Cognitive-Linquistic Treatment   Treatment focused on Voice   Skilled Treatment The patient was provided with written and verbal teaching regarding neck and  shoulder relaxation exercises to promote relaxed phonation.  Patient was provided with verbal and written instruction in exercises for Muscle Tension Dysphonia.  Patient demonstrates accurate execution of exercises.  Patient instructed in relaxed phonation / oral resonance. Added trill with pitch changes, patient able to perform well.  The patient was provided with written and verbal teaching regarding breath support exercises.  She benefits from cues to expand rim cage vs. watch for stomach rise.  Patient able to generate clear vocal quality with hum.  She is able to sense and feel correct vs. incorrect production.  Patient able to maintain vocal clarity with 100% accuracy in /m/+vowel syllables and 80% in reading phrases/sentences aloud.  Maintain vocal clarity in conversation with mod-max SLP cues.     Assessment / Recommendations / Plan   Plan Continue with current plan of care   Progression Toward Goals   Progression toward goals Progressing toward goals          SLP Education - 10/22/14 1000    Education provided Yes   Education Details vocal loudness, oral resonance, breath support, neck and tongue stretches, pitch glides   Person(s) Educated Patient   Methods Explanation;Demonstration;Verbal cues   Comprehension Verbalized understanding;Returned demonstration;Verbal cues required;Need further instruction  SLP Long Term Goals - 10/06/14 1536    SLP LONG TERM GOAL #1   Title The patient will demonstrate independent understanding of vocal hygiene concepts and neck, shoulder, lingual stretching exercises.   Time 8   Period Weeks   Status New   SLP LONG TERM GOAL #2   Title The patient will be independent for abdominal breathing and breath support exercises.   Time 8   Period Weeks   Status New   SLP LONG TERM GOAL #3   Title The patient will maximize voice quality and loudness using breath support for sustained vowel production, pitch glides, and hierarchal speech  drill.   Time 8   Period Weeks   Status New   SLP LONG TERM GOAL #4   Title The patient will maximize voice quality and loudness using breath support for paragraph length recitation with 80% accuracy.   Time 8   Period Weeks   Status New          Plan - 10/22/14 1001    Clinical Impression Statement The patient is able to generate a clear vocal quality using nasality in combination with vocal loudness.  She is able to perform limited pitch glides with vocal clarity. She demonstrates emerging ability to detect and fix tense productions.   Speech Therapy Frequency 2x / week   Duration Other (comment)   Treatment/Interventions Other (comment)  Voice therapy   Potential to Achieve Goals Good   Potential Considerations Ability to learn/carryover information;Co-morbidities;Cooperation/participation level;Previous level of function;Severity of impairments;Family/community support   SLP Home Exercise Plan Neck and tongue streteches, breath support exercises, hum to achieve oral resonance, pitch glides   Consulted and Agree with Plan of Care Patient        Problem List Patient Active Problem List   Diagnosis Date Noted  . Breast tenderness in female 09/14/2014  . Viral URI with cough 07/14/2014  . Laryngitis, acute 07/14/2014  . Pain in right hip 07/14/2014  . Carotid bruit 07/14/2014  . Difficulty sleeping 03/30/2014  . Health care maintenance 03/30/2014  . Ankle fracture, left 12/29/2013  . Urinary frequency 12/29/2013  . Diverticulosis 03/27/2013  . 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 Abnormal mammogram 09/06/2012  . Osteoporosis 05/29/2012  . Thyroid goiter 05/28/2012  . Barrett's esophagus 05/28/2012  . Hyperlipidemia 01/03/2011  . Palpitations 01/03/2011  . GERD (gastroesophageal reflux disease) 01/03/2011   Dollene Primrose, MS/CCC- SLP  Leandrew Koyanagi 10/22/2014, 10:03 AM  [place] [facility] MAIN [facility]  SERVICES [address] [place], [place], [address] Phone: [phone]   Fax:  [phone]

## 2014-10-23 ENCOUNTER — Ambulatory Visit: Payer: Medicare Other | Admitting: Speech Pathology

## 2014-10-23 DIAGNOSIS — R490 Dysphonia: Secondary | ICD-10-CM

## 2014-10-24 ENCOUNTER — Encounter: Payer: Self-pay | Admitting: Speech Pathology

## 2014-10-24 NOTE — Therapy (Signed)
[place] [facility] MAIN [facility] SERVICES [address] [place], [place], [address] Phone: [phone]   Fax:  [phone]  Speech Language Pathology Treatment  Patient Details  Name: Carrie Noble MRN: [id number] Date of Birth: 05-30-1940 Referring Provider:  Bud Face, MD  Encounter Date: 10/23/2014      End of Session - 10/24/14 0903    Visit Number 6   Number of Visits 17   Date for SLP Re-Evaluation 12/07/14   SLP Start Time 1600   SLP Stop Time  1654   SLP Time Calculation (min) 54 min   Activity Tolerance Patient tolerated treatment well      Past Medical History  Diagnosis Date  . Hyperlipidemia   . Gastroesophageal reflux   . Hiatal hernia 1989    status post Nissen fundoplication   . Hx of hysterectomy   . Tachycardia     Patient stated that this has been occuring frequently.  . Arthritis   . Allergy   . Thyroid disease     Goiter    Past Surgical History  Procedure Laterality Date  . Tonsillectomy      as well as goiter  . Laparoscopic nissen fundoplication  1999  . Abdominal hysterectomy  1980    partial  . Esophagogastric fundoplication  1999  . Colonoscopy  2015  . Upper gi endoscopy  2015  . Breast biopsy Right     neg  . Breast excisional biopsy Left 2014    neg    There were no vitals filed for this visit.  Visit Diagnosis: Dysphonia      Subjective Assessment - 10/24/14 0818    Subjective Patient states that her voice is better.               ADULT SLP TREATMENT - 10/24/14 0001    General Information   Behavior/Cognition Alert;Cooperative;Pleasant mood   Treatment Provided   Treatment provided Cognitive-Linquistic   Pain Assessment   Pain Assessment No/denies pain   Cognitive-Linquistic Treatment   Treatment focused on Voice   Skilled Treatment The patient was provided with written and verbal teaching regarding neck and shoulder relaxation exercises to promote relaxed phonation.   Patient was provided with verbal and written instruction in exercises for Muscle Tension Dysphonia.  Patient demonstrates accurate execution of exercises.  Patient instructed in relaxed phonation / oral resonance. Added trill with pitch changes, patient able to perform well.  The patient was provided with written and verbal teaching regarding breath support exercises.  She benefits from cues to expand rim cage vs. watch for stomach rise.  Patient able to generate clear vocal quality with hum.  She is able to sense and feel correct vs. incorrect production.  Patient able to maintain vocal clarity with 100% accuracy in /m/+vowel syllables and 80% in reading phrases/sentences aloud.  Maintain vocal clarity in conversation with min-mod SLP cues.     Assessment / Recommendations / Plan   Plan Continue with current plan of care   Progression Toward Goals   Progression toward goals Progressing toward goals          SLP Education - 10/24/14 0819    Education provided Yes   Education Details vocal loudness, oral resonance, breath support, neck and tongue stretches, pitch glides   Person(s) Educated Patient   Methods Explanation;Demonstration;Verbal cues   Comprehension Verbalized understanding;Returned demonstration;Verbal cues required;Need further instruction            SLP Long Term  Goals - 10/06/14 1536    SLP LONG TERM GOAL #1   Title The patient will demonstrate independent understanding of vocal hygiene concepts and neck, shoulder, lingual stretching exercises.   Time 8   Period Weeks   Status New   SLP LONG TERM GOAL #2   Title The patient will be independent for abdominal breathing and breath support exercises.   Time 8   Period Weeks   Status New   SLP LONG TERM GOAL #3   Title The patient will maximize voice quality and loudness using breath support for sustained vowel production, pitch glides, and hierarchal speech drill.   Time 8   Period Weeks   Status New   SLP LONG TERM  GOAL #4   Title The patient will maximize voice quality and loudness using breath support for paragraph length recitation with 80% accuracy.   Time 8   Period Weeks   Status New          Plan - 10/24/14 7829    Clinical Impression Statement The patient is able to generate a clear vocal quality using nasality in combination with vocal loudness.  She is able to perform improved pitch glides with vocal clarity. She demonstrates improving generalization into conversational speech.   Speech Therapy Frequency 2x / week   Duration Other (comment)   Treatment/Interventions Other (comment)  Voice Therapy   Potential to Achieve Goals Good   Potential Considerations Ability to learn/carryover information;Co-morbidities;Cooperation/participation level;Previous level of function;Severity of impairments;Family/community support   SLP Home Exercise Plan Neck and tongue streteches, breath support exercises, hum to achieve oral resonance, pitch glides   Consulted and Agree with Plan of Care Patient        Problem List Patient Active Problem List   Diagnosis Date Noted  . Breast tenderness in female 09/14/2014  . Viral URI with cough 07/14/2014  . Laryngitis, acute 07/14/2014  . Pain in right hip 07/14/2014  . Carotid bruit 07/14/2014  . Difficulty sleeping 03/30/2014  . Health care maintenance 03/30/2014  . Ankle fracture, left 12/29/2013  . Urinary frequency 12/29/2013  . Diverticulosis 03/27/2013  . 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 Abnormal mammogram 09/06/2012  . Osteoporosis 05/29/2012  . Thyroid goiter 05/28/2012  . Barrett's esophagus 05/28/2012  . Hyperlipidemia 01/03/2011  . Palpitations 01/03/2011  . GERD (gastroesophageal reflux disease) 01/03/2011   Dollene Primrose, MS/CCC- SLP  Leandrew Koyanagi 10/24/2014, 9:06 AM  [place] [facility] MAIN [facility] SERVICES [address] [place], [place], [address] Phone:  [phone]   Fax:  [phone]

## 2014-10-28 ENCOUNTER — Ambulatory Visit: Payer: Medicare Other | Attending: Otolaryngology | Admitting: Speech Pathology

## 2014-10-28 DIAGNOSIS — R490 Dysphonia: Secondary | ICD-10-CM | POA: Insufficient documentation

## 2014-10-29 ENCOUNTER — Encounter: Payer: Self-pay | Admitting: Speech Pathology

## 2014-10-29 NOTE — Therapy (Signed)
[place] [facility] MAIN [facility] SERVICES [address] [place], [place], [address] Phone: [phone]   Fax:  [phone]  Speech Language Pathology Treatment  Patient Details  Name: Carrie Noble MRN: [id number] Date of Birth: 1940/10/23 Referring Provider:  Bud Face, MD  Encounter Date: 10/28/2014      End of Session - 10/29/14 1058    Visit Number 7   Number of Visits 17   Date for SLP Re-Evaluation 12/07/14   SLP Start Time 1600   SLP Stop Time  1655   SLP Time Calculation (min) 55 min   Activity Tolerance Patient tolerated treatment well      Past Medical History  Diagnosis Date  . Hyperlipidemia   . Gastroesophageal reflux   . Hiatal hernia 1989    status post Nissen fundoplication   . Hx of hysterectomy   . Tachycardia     Patient stated that this has been occuring frequently.  . Arthritis   . Allergy   . Thyroid disease     Goiter    Past Surgical History  Procedure Laterality Date  . Tonsillectomy      as well as goiter  . Laparoscopic nissen fundoplication  1999  . Abdominal hysterectomy  1980    partial  . Esophagogastric fundoplication  1999  . Colonoscopy  2015  . Upper gi endoscopy  2015  . Breast biopsy Right     neg  . Breast excisional biopsy Left 2014    neg    There were no vitals filed for this visit.  Visit Diagnosis: Dysphonia      Subjective Assessment - 10/29/14 1057    Subjective Patient states that her voice is better.   Currently in Pain? No/denies               ADULT SLP TREATMENT - 10/29/14 0001    General Information   Behavior/Cognition Alert;Cooperative;Pleasant mood   Treatment Provided   Treatment provided Cognitive-Linquistic   Pain Assessment   Pain Assessment No/denies pain   Cognitive-Linquistic Treatment   Treatment focused on Voice   Skilled Treatment The patient was provided with written and verbal teaching regarding neck and shoulder relaxation exercises  to promote relaxed phonation.  Patient was provided with verbal and written instruction in exercises for Muscle Tension Dysphonia.  Patient demonstrates accurate execution of exercises.  Patient instructed in relaxed phonation / oral resonance. Added trill with pitch changes, patient able to perform well.  The patient was provided with written and verbal teaching regarding breath support exercises.  She benefits from cues to expand rim cage vs. watch for stomach rise.  Patient able to generate clear vocal quality with hum.  She is able to sense and feel correct vs. incorrect production.  Patient able to maintain vocal clarity with 80% accuracy reading paragraphs aloud.  Maintain vocal clarity in conversation with min SLP cues.     Assessment / Recommendations / Plan   Plan Continue with current plan of care   Progression Toward Goals   Progression toward goals Progressing toward goals          SLP Education - 10/29/14 1058    Education provided Yes   Education Details vocal loudness, oral resonance, breath support, neck and tongue stretches, pitch glides   Person(s) Educated Patient   Methods Explanation;Demonstration;Verbal cues   Comprehension Verbalized understanding;Returned demonstration;Verbal cues required;Need further instruction            SLP Long Term  Goals - 10/06/14 1536    SLP LONG TERM GOAL #1   Title The patient will demonstrate independent understanding of vocal hygiene concepts and neck, shoulder, lingual stretching exercises.   Time 8   Period Weeks   Status New   SLP LONG TERM GOAL #2   Title The patient will be independent for abdominal breathing and breath support exercises.   Time 8   Period Weeks   Status New   SLP LONG TERM GOAL #3   Title The patient will maximize voice quality and loudness using breath support for sustained vowel production, pitch glides, and hierarchal speech drill.   Time 8   Period Weeks   Status New   SLP LONG TERM GOAL #4    Title The patient will maximize voice quality and loudness using breath support for paragraph length recitation with 80% accuracy.   Time 8   Period Weeks   Status New          Plan - 10/29/14 1059    Clinical Impression Statement The patient is able to generate a clear vocal quality using nasality in combination with vocal loudness.  She is able to perform improved pitch glides with vocal clarity. She demonstrates improving generalization into conversational speech.   Speech Therapy Frequency 2x / week   Duration Other (comment)   Treatment/Interventions Other (comment)  Voice therapy   Potential to Achieve Goals Good   Potential Considerations Ability to learn/carryover information;Co-morbidities;Cooperation/participation level;Previous level of function;Severity of impairments;Family/community support   SLP Home Exercise Plan Neck and tongue streteches, breath support exercises, hum to achieve oral resonance, pitch glides   Consulted and Agree with Plan of Care Patient        Problem List Patient Active Problem List   Diagnosis Date Noted  . Breast tenderness in female 09/14/2014  . Viral URI with cough 07/14/2014  . Laryngitis, acute 07/14/2014  . Pain in right hip 07/14/2014  . Carotid bruit 07/14/2014  . Difficulty sleeping 03/30/2014  . Health care maintenance 03/30/2014  . Ankle fracture, left 12/29/2013  . Urinary frequency 12/29/2013  . Diverticulosis 03/27/2013  . 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 Abnormal mammogram 09/06/2012  . Osteoporosis 05/29/2012  . Thyroid goiter 05/28/2012  . Barrett's esophagus 05/28/2012  . Hyperlipidemia 01/03/2011  . Palpitations 01/03/2011  . GERD (gastroesophageal reflux disease) 01/03/2011   Dollene Primrose, MS/CCC- SLP  Leandrew Koyanagi 10/29/2014, 10:59 AM  [place] [facility] MAIN [facility] SERVICES [address] [place], [place], [address] Phone: [phone]    Fax:  [phone]

## 2014-10-30 ENCOUNTER — Ambulatory Visit: Payer: Medicare Other | Admitting: Speech Pathology

## 2014-10-30 DIAGNOSIS — R490 Dysphonia: Secondary | ICD-10-CM | POA: Diagnosis not present

## 2014-10-31 ENCOUNTER — Encounter: Payer: Self-pay | Admitting: Speech Pathology

## 2014-10-31 NOTE — Therapy (Signed)
[place] [facility] MAIN [facility] SERVICES [address] [place], [place], [address] Phone: [phone]   Fax:  [phone]  Speech Language Pathology Treatment  Patient Details  Name: Carrie Noble MRN: [id number] Date of Birth: 1940/05/13 Referring Provider:  Bud Face, MD  Encounter Date: 10/30/2014      End of Session - 10/31/14 1044    Visit Number 8   Number of Visits 17   Date for SLP Re-Evaluation 12/07/14   SLP Start Time 1600   SLP Stop Time  1655   SLP Time Calculation (min) 55 min   Activity Tolerance Patient tolerated treatment well      Past Medical History  Diagnosis Date  . Hyperlipidemia   . Gastroesophageal reflux   . Hiatal hernia 1989    status post Nissen fundoplication   . Hx of hysterectomy   . Tachycardia     Patient stated that this has been occuring frequently.  . Arthritis   . Allergy   . Thyroid disease     Goiter    Past Surgical History  Procedure Laterality Date  . Tonsillectomy      as well as goiter  . Laparoscopic nissen fundoplication  1999  . Abdominal hysterectomy  1980    partial  . Esophagogastric fundoplication  1999  . Colonoscopy  2015  . Upper gi endoscopy  2015  . Breast biopsy Right     neg  . Breast excisional biopsy Left 2014    neg    There were no vitals filed for this visit.  Visit Diagnosis: Dysphonia      Subjective Assessment - 10/31/14 1044    Subjective Patient states that her voice is better.   Currently in Pain? No/denies               ADULT SLP TREATMENT - 10/31/14 0001    General Information   Behavior/Cognition Alert;Cooperative;Pleasant mood   Treatment Provided   Treatment provided Cognitive-Linquistic   Pain Assessment   Pain Assessment No/denies pain   Cognitive-Linquistic Treatment   Treatment focused on Voice   Skilled Treatment The patient was provided with written and verbal teaching regarding neck and shoulder relaxation exercises  to promote relaxed phonation.  Patient was provided with verbal and written instruction in exercises for Muscle Tension Dysphonia.  Patient demonstrates accurate execution of exercises.  Patient instructed in relaxed phonation / oral resonance. Added trill with pitch changes, patient able to perform well.  The patient was provided with written and verbal teaching regarding breath support exercises.  She benefits from cues to expand rim cage vs. watch for stomach rise.  Patient able to generate clear vocal quality with hum.  She is able to sense and feel correct vs. incorrect production.  Patient able to maintain vocal clarity with 80% accuracy reading paragraphs aloud.  Maintain vocal clarity in conversation with min SLP cues.     Assessment / Recommendations / Plan   Plan Continue with current plan of care   Progression Toward Goals   Progression toward goals Progressing toward goals          SLP Education - 10/31/14 1044    Education provided Yes   Education Details vocal loudness, oral resonance, breath support, neck and tongue stretches, pitch glides   Person(s) Educated Patient   Methods Explanation;Demonstration;Verbal cues;Handout   Comprehension Verbalized understanding;Returned demonstration;Verbal cues required;Need further instruction            SLP Long Term  Goals - 10/06/14 1536    SLP LONG TERM GOAL #1   Title The patient will demonstrate independent understanding of vocal hygiene concepts and neck, shoulder, lingual stretching exercises.   Time 8   Period Weeks   Status New   SLP LONG TERM GOAL #2   Title The patient will be independent for abdominal breathing and breath support exercises.   Time 8   Period Weeks   Status New   SLP LONG TERM GOAL #3   Title The patient will maximize voice quality and loudness using breath support for sustained vowel production, pitch glides, and hierarchal speech drill.   Time 8   Period Weeks   Status New   SLP LONG TERM GOAL #4    Title The patient will maximize voice quality and loudness using breath support for paragraph length recitation with 80% accuracy.   Time 8   Period Weeks   Status New          Plan - 10/31/14 1045    Clinical Impression Statement The patient is able to generate a clear vocal quality using nasality in combination with vocal loudness.  She is able to perform improved pitch glides with vocal clarity. She demonstrates improving generalization into conversational speech.   Speech Therapy Frequency 2x / week   Duration Other (comment)   Treatment/Interventions Other (comment)  Voice therapy   Potential to Achieve Goals Good   Potential Considerations Ability to learn/carryover information;Co-morbidities;Cooperation/participation level;Previous level of function;Severity of impairments;Family/community support   SLP Home Exercise Plan Neck and tongue streteches, breath support exercises, hum to achieve oral resonance, pitch glides   Consulted and Agree with Plan of Care Patient        Problem List Patient Active Problem List   Diagnosis Date Noted  . Breast tenderness in female 09/14/2014  . Viral URI with cough 07/14/2014  . Laryngitis, acute 07/14/2014  . Pain in right hip 07/14/2014  . Carotid bruit 07/14/2014  . Difficulty sleeping 03/30/2014  . Health care maintenance 03/30/2014  . Ankle fracture, left 12/29/2013  . Urinary frequency 12/29/2013  . Diverticulosis 03/27/2013  . 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 Abnormal mammogram 09/06/2012  . Osteoporosis 05/29/2012  . Thyroid goiter 05/28/2012  . Barrett's esophagus 05/28/2012  . Hyperlipidemia 01/03/2011  . Palpitations 01/03/2011  . GERD (gastroesophageal reflux disease) 01/03/2011   Dollene Primrose, MS/CCC- SLP  Leandrew Koyanagi 10/31/2014, 10:46 AM  [place] [facility] MAIN [facility] SERVICES [address] [place], [place], [address] Phone: [phone]    Fax:  [phone]

## 2014-11-04 ENCOUNTER — Ambulatory Visit: Payer: Medicare Other | Admitting: Speech Pathology

## 2014-11-04 DIAGNOSIS — R490 Dysphonia: Secondary | ICD-10-CM | POA: Diagnosis not present

## 2014-11-05 ENCOUNTER — Encounter: Payer: Self-pay | Admitting: Speech Pathology

## 2014-11-05 NOTE — Therapy (Signed)
[place] [facility] MAIN [facility] SERVICES [address] [place], [place], [address] Phone: [phone]   Fax:  [phone]  Speech Language Pathology Treatment  Patient Details  Name: Carrie Noble MRN: [id number] Date of Birth: [birth year] Referring Provider:  Bud Face, MD  Encounter Date: 11/04/2014      End of Session - 11/05/14 1050    Visit Number 9   Number of Visits 17   Date for SLP Re-Evaluation 12/07/14   SLP Start Time 1600   SLP Stop Time  1656   SLP Time Calculation (min) 56 min   Activity Tolerance Patient tolerated treatment well      Past Medical History  Diagnosis Date  . Hyperlipidemia   . Gastroesophageal reflux   . Hiatal hernia 1989    status post Nissen fundoplication   . Hx of hysterectomy   . Tachycardia     Patient stated that this has been occuring frequently.  . Arthritis   . Allergy   . Thyroid disease     Goiter    Past Surgical History  Procedure Laterality Date  . Tonsillectomy      as well as goiter  . Laparoscopic nissen fundoplication  1999  . Abdominal hysterectomy  1980    partial  . Esophagogastric fundoplication  1999  . Colonoscopy  2015  . Upper gi endoscopy  2015  . Breast biopsy Right     neg  . Breast excisional biopsy Left 2014    neg    There were no vitals filed for this visit.  Visit Diagnosis: Dysphonia      Subjective Assessment - 11/05/14 1048    Subjective Patient states that her voice is better, but "I still need to think about it".   Currently in Pain? No/denies               ADULT SLP TREATMENT - 11/05/14 0001    General Information   Behavior/Cognition Alert;Cooperative;Pleasant mood   Treatment Provided   Treatment provided Cognitive-Linquistic   Pain Assessment   Pain Assessment No/denies pain   Cognitive-Linquistic Treatment   Treatment focused on Voice   Skilled Treatment The patient was provided with written and verbal teaching regarding  neck and shoulder relaxation exercises to promote relaxed phonation.  Patient was provided with verbal and written instruction in exercises for Muscle Tension Dysphonia.  Patient demonstrates accurate execution of exercises.  Patient instructed in relaxed phonation / oral resonance. Added trill with pitch changes, patient able to perform well.  The patient was provided with written and verbal teaching regarding breath support exercises.  She benefits from cues to expand rim cage vs. watch for stomach rise.  Patient able to generate clear vocal quality with hum.  She is able to sense and feel correct vs. incorrect production.  Patient able to maintain vocal clarity with 80% accuracy reading paragraphs aloud.  Maintain vocal clarity in conversation with min SLP cues.     Assessment / Recommendations / Plan   Plan Continue with current plan of care          SLP Education - 11/05/14 1049    Education provided Yes   Education Details vocal loudness, oral resonance, breath support, neck and tongue stretches, pitch glides   Person(s) Educated Patient   Methods Explanation;Demonstration;Verbal cues;Handout   Comprehension Verbalized understanding;Returned demonstration;Verbal cues required;Need further instruction            SLP Long Term Goals - 10/06/14 1536  SLP LONG TERM GOAL #1   Title The patient will demonstrate independent understanding of vocal hygiene concepts and neck, shoulder, lingual stretching exercises.   Time 8   Period Weeks   Status New   SLP LONG TERM GOAL #2   Title The patient will be independent for abdominal breathing and breath support exercises.   Time 8   Period Weeks   Status New   SLP LONG TERM GOAL #3   Title The patient will maximize voice quality and loudness using breath support for sustained vowel production, pitch glides, and hierarchal speech drill.   Time 8   Period Weeks   Status New   SLP LONG TERM GOAL #4   Title The patient will maximize voice  quality and loudness using breath support for paragraph length recitation with 80% accuracy.   Time 8   Period Weeks   Status New          Plan - 11/05/14 1052    Clinical Impression Statement The patient is able to generate a clear vocal quality using nasality in combination with vocal loudness.  She is able to perform improved pitch glides with vocal clarity. She demonstrates improving generalization into conversational speech.   Speech Therapy Frequency 2x / week   Duration Other (comment)   Treatment/Interventions Other (comment)  Voice therapy   Potential to Achieve Goals Good   Potential Considerations Ability to learn/carryover information;Co-morbidities;Cooperation/participation level;Previous level of function;Severity of impairments;Family/community support   SLP Home Exercise Plan Neck and tongue streteches, breath support exercises, hum to achieve oral resonance, pitch glides   Consulted and Agree with Plan of Care Patient        Problem List Patient Active Problem List   Diagnosis Date Noted  . Breast tenderness in female 09/14/2014  . Viral URI with cough 07/14/2014  . Laryngitis, acute 07/14/2014  . Pain in right hip 07/14/2014  . Carotid bruit 07/14/2014  . Difficulty sleeping 03/30/2014  . Health care maintenance 03/30/2014  . Ankle fracture, left 12/29/2013  . Urinary frequency 12/29/2013  . Diverticulosis 03/27/2013  . 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 Abnormal mammogram 09/06/2012  . Osteoporosis 05/29/2012  . Thyroid goiter 05/28/2012  . Barrett's esophagus 05/28/2012  . Hyperlipidemia 01/03/2011  . Palpitations 01/03/2011  . GERD (gastroesophageal reflux disease) 01/03/2011   Dollene PrimroseSusan G Solace Wendorff, MS/CCC- SLP  Leandrew KoyanagiAbernathy, Susie 11/05/2014, 10:53 AM  [place] [facility] MAIN [facility] SERVICES [address] [place], [place], [address] Phone: [phone]   Fax:  [phone]

## 2014-11-06 ENCOUNTER — Ambulatory Visit: Payer: Medicare Other | Admitting: Speech Pathology

## 2014-11-06 DIAGNOSIS — R490 Dysphonia: Secondary | ICD-10-CM | POA: Diagnosis not present

## 2014-11-07 ENCOUNTER — Encounter: Payer: Self-pay | Admitting: Speech Pathology

## 2014-11-07 NOTE — Therapy (Signed)
[place] MAIN [facility] SERVICES [address] [place], [place], [address] Phone: [phone]   Fax:  [phone]  Speech Language Pathology Treatment/Discharge Summary  Patient Details  Name: Carrie Noble MRN: [id number] Date of Birth: July 07, 1940 No Data Recorded  Encounter Date: 11/06/2014      End of Session - 11/07/14 0916    Visit Number 10   Number of Visits 17   Date for SLP Re-Evaluation 12/07/14   SLP Start Time 1600   SLP Stop Time  1656   SLP Time Calculation (min) 56 min   Activity Tolerance Patient tolerated treatment well      Past Medical History  Diagnosis Date  . Hyperlipidemia   . Gastroesophageal reflux   . Hiatal hernia 1989    status post Nissen fundoplication   . Hx of hysterectomy   . Tachycardia     Patient stated that this has been occuring frequently.  . Arthritis   . Allergy   . Thyroid disease     Goiter    Past Surgical History  Procedure Laterality Date  . Tonsillectomy      as well as goiter  . Laparoscopic nissen fundoplication  4076  . Abdominal hysterectomy  1980    partial  . Esophagogastric fundoplication  8088  . Colonoscopy  2015  . Upper gi endoscopy  2015  . Breast biopsy Right     neg  . Breast excisional biopsy Left 2014    neg    There were no vitals filed for this visit.  Visit Diagnosis: Dysphonia      Subjective Assessment - 11/07/14 0914    Subjective Patient states that her voice is better, but "I still need to think about it".  She agrees that she is ready for discharge from voice therapy.   Currently in Pain? No/denies               ADULT SLP TREATMENT - 11/07/14 0001    General Information   Behavior/Cognition Alert;Cooperative;Pleasant mood   Treatment Provided   Treatment provided Cognitive-Linquistic   Pain Assessment   Pain Assessment No/denies pain   Cognitive-Linquistic Treatment   Treatment focused on Voice   Skilled Treatment The patient  was provided with written and verbal teaching regarding neck and shoulder relaxation exercises to promote relaxed phonation.  Patient was provided with verbal and written instruction in exercises for Muscle Tension Dysphonia.  Patient demonstrates accurate execution of exercises.  Patient instructed in relaxed phonation / oral resonance. Added trill with pitch changes, patient able to perform well.  The patient was provided with written and verbal teaching regarding breath support exercises.  She benefits from cues to expand rim cage vs. watch for stomach rise.  Patient able to generate clear vocal quality with hum.  She is able to sense and feel correct vs. incorrect production.  Patient able to maintain vocal clarity with 80+% accuracy reading paragraphs aloud.  Maintain vocal clarity in conversation with over 80% accuracy without cues from SLP.     Assessment / Recommendations / Plan   Plan Discharge SLP treatment due to (comment);All goals met   Progression Toward Goals   Progression toward goals Goals met, education completed, patient discharged from [facility] Education - 11/07/14 0915    Education provided Yes   Education Details vocal loudness, oral resonance, breath support, neck and tongue stretches, pitch glides   Person(s) Educated Patient  Methods Explanation;Demonstration;Verbal cues;Handout   Comprehension Verbalized understanding;Returned demonstration            SLP Long Term Goals - 12-07-14 0916    SLP LONG TERM GOAL #1   Title The patient will demonstrate independent understanding of vocal hygiene concepts and neck, shoulder, lingual stretching exercises.   Time 8   Period Weeks   Status Achieved   SLP LONG TERM GOAL #2   Title The patient will be independent for abdominal breathing and breath support exercises.   Time 8   Period Weeks   Status Achieved   SLP LONG TERM GOAL #3   Title The patient will maximize voice quality and loudness using breath  support for sustained vowel production, pitch glides, and hierarchal speech drill.   Time 8   Period Weeks   Status Achieved   SLP LONG TERM GOAL #4   Title The patient will maximize voice quality and loudness using breath support for paragraph length recitation with 80% accuracy.   Time 8   Period Weeks   Status Achieved            G-Codes - 12/07/14 3837    Functional Assessment Tool Used Clinical judgment   Functional Limitations Voice   Voice Current Status [id number]) At least 1 percent but less than 20 percent impaired, limited or restricted   Voice Goal Status (U8648) At least 1 percent but less than 20 percent impaired, limited or restricted   Voice Discharge Status [phone]) At least 1 percent but less than 20 percent impaired, limited or restricted      Problem List Patient Active Problem List   Diagnosis Date Noted  . Breast tenderness in female 09/14/2014  . Viral URI with cough 07/14/2014  . Laryngitis, acute 07/14/2014  . Pain in right hip 07/14/2014  . Carotid bruit 07/14/2014  . Difficulty sleeping 03/30/2014  . Health care maintenance 03/30/2014  . Ankle fracture, left 12/29/2013  . Urinary frequency 12/29/2013  . Diverticulosis 03/27/2013  . 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 Abnormal mammogram 09/06/2012  . Osteoporosis 05/29/2012  . Thyroid goiter 05/28/2012  . Barrett's esophagus 05/28/2012  . Hyperlipidemia 01/03/2011  . Palpitations 01/03/2011  . GERD (gastroesophageal reflux disease) 01/03/2011   Leroy Sea, MS/CCC- SLP  Lou Miner December 07, 2014, 9:18 AM  [place] MAIN [facility] SERVICES [address] [place], [place], [address] Phone: [phone]   Fax:  [phone]   Name: Carrie Noble MRN: [id number] Date of Birth: December 22, 1940

## 2014-12-04 ENCOUNTER — Telehealth: Payer: Self-pay | Admitting: Internal Medicine

## 2014-12-04 NOTE — Telephone Encounter (Signed)
Spoke with patient.

## 2014-12-04 NOTE — Telephone Encounter (Signed)
Pt also was not sure if she had lab work done or not? Does pt need to get lab work done? Thank You!

## 2014-12-04 NOTE — Telephone Encounter (Signed)
Pt called wanting a copy of her lab work

## 2014-12-10 ENCOUNTER — Other Ambulatory Visit: Payer: Self-pay | Admitting: Internal Medicine

## 2014-12-10 DIAGNOSIS — E042 Nontoxic multinodular goiter: Secondary | ICD-10-CM

## 2014-12-16 ENCOUNTER — Ambulatory Visit
Admission: RE | Admit: 2014-12-16 | Discharge: 2014-12-16 | Disposition: A | Discharge status: Home / Self Care | Payer: Medicare Other | Source: Ambulatory Visit | Attending: Internal Medicine | Admitting: Internal Medicine

## 2014-12-16 DIAGNOSIS — E042 Nontoxic multinodular goiter: Secondary | ICD-10-CM

## 2015-03-31 ENCOUNTER — Other Ambulatory Visit: Payer: Self-pay | Admitting: Gastroenterology

## 2015-03-31 DIAGNOSIS — R1013 Epigastric pain: Secondary | ICD-10-CM

## 2015-04-07 ENCOUNTER — Ambulatory Visit
Admission: RE | Admit: 2015-04-07 | Discharge: 2015-04-07 | Disposition: A | Discharge status: Home / Self Care | Payer: Medicare Other | Source: Ambulatory Visit | Attending: Gastroenterology | Admitting: Gastroenterology

## 2015-04-07 DIAGNOSIS — K449 Diaphragmatic hernia without obstruction or gangrene: Secondary | ICD-10-CM | POA: Diagnosis not present

## 2015-04-07 DIAGNOSIS — K219 Gastro-esophageal reflux disease without esophagitis: Secondary | ICD-10-CM | POA: Diagnosis not present

## 2015-04-07 DIAGNOSIS — R1310 Dysphagia, unspecified: Secondary | ICD-10-CM | POA: Diagnosis present

## 2015-04-07 DIAGNOSIS — R1013 Epigastric pain: Secondary | ICD-10-CM

## 2015-05-06 ENCOUNTER — Telehealth: Payer: Self-pay | Admitting: Internal Medicine

## 2015-05-06 ENCOUNTER — Ambulatory Visit (INDEPENDENT_AMBULATORY_CARE_PROVIDER_SITE_OTHER): Payer: Medicare Other | Admitting: Family Medicine

## 2015-05-06 VITALS — BP 110/72 | HR 83 | Temp 98.3°F | Ht 64.0 in | Wt 143.0 lb

## 2015-05-06 DIAGNOSIS — H1010 Acute atopic conjunctivitis, unspecified eye: Secondary | ICD-10-CM | POA: Insufficient documentation

## 2015-05-06 DIAGNOSIS — M810 Age-related osteoporosis without current pathological fracture: Secondary | ICD-10-CM

## 2015-05-06 DIAGNOSIS — J309 Allergic rhinitis, unspecified: Secondary | ICD-10-CM | POA: Insufficient documentation

## 2015-05-06 DIAGNOSIS — E785 Hyperlipidemia, unspecified: Secondary | ICD-10-CM

## 2015-05-06 DIAGNOSIS — E049 Nontoxic goiter, unspecified: Secondary | ICD-10-CM

## 2015-05-06 MED ORDER — MONTELUKAST SODIUM 10 MG PO TABS: 10.0000 mg | ORAL_TABLET | Freq: Every day | ORAL | Status: DC

## 2015-05-06 NOTE — Telephone Encounter (Signed)
Pt would like to get lab work done. Need orders please and thank you!

## 2015-05-06 NOTE — Telephone Encounter (Signed)
Called pt and made appts for both.Marland Kitchen..Marland Kitchen

## 2015-05-06 NOTE — Progress Notes (Signed)
Subjective:  Patient ID: Carrie MorLinda W Noble, female    DOB: 12/02/1940  Age: 75 y.o. MRN: [id number]  CC: ST, L ear pain, eye itching/burning  HPI:  75 year old female presents to clinic today with the above complaints.  Patient states she's been having the above issues the past month. She states that she's been experiencing sore throat, postnasal drip, left ear pain, eye itching and burning. She has seen ENT previously for this and was prescribed Dymista. For the past week, her symptoms are worsening. She is particularly bothered by the sore throat. She has been using the Dymista as well as an over-the-counter antihistamine, Tussin, Tylenol, and eyedrops for her symptoms. She's had some relief but no resolution. No associated fevers or chills. No other complaints at this time. No known exacerbating factors, although this appears to be allergic in nature.  Social Hx   Social History   Social History  . Marital Status: Married    Spouse Name: N/A  . Number of Children: N/A  . Years of Education: N/A   Occupational History  . Retired Other   Social History Main Topics  . Smoking status: Never Smoker   . Smokeless tobacco: Never Used  . Alcohol Use: No  . Drug Use: No  . Sexual Activity: Not on file   Other Topics Concern  . Not on file   Social History Narrative   Occasionally drinks coffee.   Review of Systems  Constitutional: Negative.   HENT: Positive for ear pain and sore throat.   Eyes: Positive for itching.   Objective:  BP 110/72 mmHg  Pulse 83  Temp(Src) 98.3 F (36.8 C) (Oral)  Ht 5\' 4"  (1.626 m)  Wt 143 lb (64.864 kg)  BMI 24.53 kg/m2  SpO2 97%  BP/Weight 05/06/2015 09/08/2014 07/25/2014  Systolic BP 110 118 120  Diastolic BP 72 80 70  Wt. (Lbs) 143 140 141  BMI 24.53 24.02 24.19   Physical Exam  Constitutional: She is oriented to person, place, and time. She appears well-developed. No distress.  HENT:  Mouth/Throat: Oropharynx is clear and moist.  Normal  TM's bilaterally.  Eyes: Conjunctivae are normal. Right eye exhibits no discharge. Left eye exhibits no discharge. No scleral icterus.  Neck: Neck supple. Thyromegaly present.  Cardiovascular: Normal rate and regular rhythm.   Pulmonary/Chest: Effort normal and breath sounds normal.  Neurological: She is alert and oriented to person, place, and time.  Vitals reviewed.  Lab Results  Component Value Date   WBC 5.4 11/07/2013   HGB 12.8 11/07/2013   HCT 38.3 11/07/2013   PLT 339.0 11/07/2013   GLUCOSE 86 04/28/2014   CHOL 253* 03/21/2014   TRIG 131.0 03/21/2014   HDL 57.90 03/21/2014   LDLDIRECT 152.2 11/07/2013   LDLCALC 169* 03/21/2014   ALT 20 04/28/2014   AST 21 04/28/2014   NA 136 04/28/2014   K 4.7 04/28/2014   CL 102 04/28/2014   CREATININE 0.81 04/28/2014   BUN 16 04/28/2014   CO2 27 04/28/2014   TSH 1.05 11/07/2013   HGBA1C 5.7 03/29/2013    Assessment & Plan:   Problem List Items Addressed This Visit    Allergic rhinitis - Primary    New problem. No evidence of infection at this time. Appears to be allergic in nature. Advised continued use of Dymista, OTC antihistamine. Adding Singulair today.      Allergic conjunctivitis    New problem. Continue eye drops - Neo/poly/Dexamethasone. Adding Singulair today.  Meds ordered this encounter  Medications  . montelukast (SINGULAIR) 10 MG tablet    Sig: Take 1 tablet (10 mg total) by mouth at bedtime.    Dispense:  30 tablet    Refill:  3    Follow-up: PRN  Everlene Other DO [facility]

## 2015-05-06 NOTE — Assessment & Plan Note (Signed)
New problem. No evidence of infection at this time. Appears to be allergic in nature. Advised continued use of Dymista, OTC antihistamine. Adding Singulair today.

## 2015-05-06 NOTE — Patient Instructions (Addendum)
Take the singulair as prescribed.  Continue the dymista and eye drops.   Follow up closely with ENT if your symptoms persist.  Take care  Dr. Adriana Simasook

## 2015-05-06 NOTE — Telephone Encounter (Signed)
I have ordered the labs.  Please schedule a non fasting lab appt.  Also she is overdue a physical.  Please schedule.

## 2015-05-06 NOTE — Progress Notes (Signed)
Pre visit review using our clinic review tool, if applicable. No additional management support is needed unless otherwise documented below in the visit note. 

## 2015-05-06 NOTE — Assessment & Plan Note (Signed)
New problem. Continue eye drops - Neo/poly/Dexamethasone. Adding Singulair today.

## 2015-05-12 ENCOUNTER — Other Ambulatory Visit (INDEPENDENT_AMBULATORY_CARE_PROVIDER_SITE_OTHER): Payer: Medicare Other

## 2015-05-12 DIAGNOSIS — E785 Hyperlipidemia, unspecified: Secondary | ICD-10-CM | POA: Diagnosis not present

## 2015-05-12 DIAGNOSIS — M810 Age-related osteoporosis without current pathological fracture: Secondary | ICD-10-CM

## 2015-05-12 DIAGNOSIS — E049 Nontoxic goiter, unspecified: Secondary | ICD-10-CM | POA: Diagnosis not present

## 2015-05-12 LAB — CBC WITH DIFFERENTIAL/PLATELET
BASOS PCT: 0.6 % (ref 0.0–3.0)
Basophils Absolute: 0 10*3/uL (ref 0.0–0.1)
EOS ABS: 0.1 10*3/uL (ref 0.0–0.7)
Eosinophils Relative: 2.9 % (ref 0.0–5.0)
HEMATOCRIT: 38.5 % (ref 36.0–46.0)
HEMOGLOBIN: 13.3 g/dL (ref 12.0–15.0)
LYMPHS PCT: 49.6 % — AB (ref 12.0–46.0)
Lymphs Abs: 2.5 10*3/uL (ref 0.7–4.0)
MCHC: 34.5 g/dL (ref 30.0–36.0)
MCV: 92.4 fl (ref 78.0–100.0)
MONOS PCT: 7.6 % (ref 3.0–12.0)
Monocytes Absolute: 0.4 10*3/uL (ref 0.1–1.0)
NEUTROS ABS: 2 10*3/uL (ref 1.4–7.7)
Neutrophils Relative %: 39.3 % — ABNORMAL LOW (ref 43.0–77.0)
PLATELETS: 328 10*3/uL (ref 150.0–400.0)
RBC: 4.16 Mil/uL (ref 3.87–5.11)
RDW: 13.6 % (ref 11.5–15.5)
WBC: 5.1 10*3/uL (ref 4.0–10.5)

## 2015-05-12 LAB — LIPID PANEL
CHOLESTEROL: 207 mg/dL — AB (ref 0–200)
HDL: 38.5 mg/dL — ABNORMAL LOW (ref >39.00)
LDL CALC: 132 mg/dL — AB (ref 0–99)
NonHDL: 168.9
Total CHOL/HDL Ratio: 5
Triglycerides: 186 mg/dL — ABNORMAL HIGH (ref 0.0–149.0)
VLDL: 37.2 mg/dL (ref 0.0–40.0)

## 2015-05-12 LAB — VITAMIN D 25 HYDROXY (VIT D DEFICIENCY, FRACTURES): VITD: 21.67 ng/mL — ABNORMAL LOW (ref 30.00–100.00)

## 2015-05-12 LAB — HEPATIC FUNCTION PANEL
ALBUMIN: 4.2 g/dL (ref 3.5–5.2)
ALT: 18 U/L (ref 0–35)
AST: 19 U/L (ref 0–37)
Alkaline Phosphatase: 107 U/L (ref 39–117)
BILIRUBIN DIRECT: 0.1 mg/dL (ref 0.0–0.3)
BILIRUBIN TOTAL: 0.5 mg/dL (ref 0.2–1.2)
TOTAL PROTEIN: 7.2 g/dL (ref 6.0–8.3)

## 2015-05-12 LAB — BASIC METABOLIC PANEL
BUN: 13 mg/dL (ref 6–23)
CHLORIDE: 104 meq/L (ref 96–112)
CO2: 30 mEq/L (ref 19–32)
CREATININE: 0.83 mg/dL (ref 0.40–1.20)
Calcium: 9.4 mg/dL (ref 8.4–10.5)
GFR: 71.04 mL/min (ref >60.00)
Glucose, Bld: 92 mg/dL (ref 70–99)
Potassium: 4.1 mEq/L (ref 3.5–5.1)
Sodium: 140 mEq/L (ref 135–145)

## 2015-05-12 LAB — TSH: TSH: 3.76 u[IU]/mL (ref 0.35–4.50)

## 2015-05-13 ENCOUNTER — Ambulatory Visit (INDEPENDENT_AMBULATORY_CARE_PROVIDER_SITE_OTHER): Payer: Medicare Other | Admitting: Allergy and Immunology

## 2015-05-13 ENCOUNTER — Encounter: Payer: Self-pay | Admitting: Allergy and Immunology

## 2015-05-13 VITALS — BP 112/76 | HR 68 | Resp 18 | Ht 63.39 in | Wt 140.0 lb

## 2015-05-13 DIAGNOSIS — K1379 Other lesions of oral mucosa: Secondary | ICD-10-CM

## 2015-05-13 DIAGNOSIS — H1013 Acute atopic conjunctivitis, bilateral: Secondary | ICD-10-CM

## 2015-05-13 DIAGNOSIS — J3089 Other allergic rhinitis: Secondary | ICD-10-CM | POA: Diagnosis not present

## 2015-05-13 MED ORDER — OLOPATADINE HCL 0.7 % OP SOLN: 1.0000 [drp] | Freq: Every day | OPHTHALMIC | Status: DC

## 2015-05-13 MED ORDER — TRIAMCINOLONE ACETONIDE 0.1 % MT PSTE: PASTE | OROMUCOSAL | Status: DC

## 2015-05-13 MED ORDER — MOMETASONE FUROATE 50 MCG/ACT NA SUSP: NASAL | Status: DC

## 2015-05-13 NOTE — Assessment & Plan Note (Signed)
   A prescription has been provided for Pazeo, one drop per eye daily as needed.

## 2015-05-13 NOTE — Patient Instructions (Addendum)
Allergic rhinitis  Based upon patient preference, we will discontinue azelastine at this time.  A prescription has been provided for Nasonex nasal spray, one spray per nostril 1-2 times daily as needed. Proper nasal spray technique has been discussed and demonstrated.  For thick postnasal drainage I have recommended guaifenesin 1200 mg twice daily as needed with adequate hydration as discussed.   Nasal saline lavage (NeilMed) as needed has been recommended along with instructions for proper administration.  Allergic conjunctivitis  A prescription has been provided for Pazeo, one drop per eye daily as needed.  Mouth sores  A prescription has been provided for triamcinolone 0.1% dental paste to affected areas in the mouth twice a day when necessary.    Return in about 6 months (around 11/12/2015), or if symptoms worsen or fail to improve.

## 2015-05-13 NOTE — Assessment & Plan Note (Signed)
   A prescription has been provided for triamcinolone 0.1% dental paste to affected areas in the mouth twice a day when necessary.

## 2015-05-13 NOTE — Progress Notes (Signed)
Follow-up Note  RE: Carrie Noble MRN: [id number] DOB: 12/29/1940 Date of Office Visit: 05/13/2015  Primary care provider: Dale Mardela [place], MD Referring provider: Dale Poinsett, MD  History of present illness: HPI Comments: Carrie Noble is a 75 y.o. female with allergic rhinitis, laryngopharyngeal reflux, and distant history of asthma who presents today for follow up.  She was last seen in this office in March 2015.  She reports that over the past 3 or 4 weeks she has experienced thick postnasal drainage, a globus sensation, and irritated/sore throat.  She saw her primary care physician recently and was started on azelastine nasal spray, montelukast, diphenhydramine, and benzonatate.  She does not perceive significant benefit from the azelastine nasal spray and would prefer to go back to the Nasonex that she had used in the past which seem to her to be more efficacious.  She also complains of ocular pruritus.  She recently saw her optometrist and was prescribed neomycin/polymixin/dexamethasone eyedrops with some perceived benefit.  Finally, she complains of mouth ulcers and sores.  She believes that the sores may have been from a particular brand of toothpaste that she had been using.  She stopped using this type of toothpaste and has been using baking soda instead, however the mouth sores have persisted.   Assessment and plan: Allergic rhinitis  Based upon patient preference, we will discontinue azelastine at this time.  A prescription has been provided for Nasonex nasal spray, one spray per nostril 1-2 times daily as needed. Proper nasal spray technique has been discussed and demonstrated.  For thick postnasal drainage I have recommended guaifenesin 1200 mg twice daily as needed with adequate hydration as discussed.   Nasal saline lavage (NeilMed) as needed has been recommended along with instructions for proper administration.  Allergic conjunctivitis  A prescription has been  provided for Pazeo, one drop per eye daily as needed.  Mouth sores  A prescription has been provided for triamcinolone 0.1% dental paste to affected areas in the mouth twice a day when necessary.    Meds ordered this encounter  Medications  . triamcinolone (KENALOG) 0.1 % paste    Sig: USE TWICE A DAY AS NEEDED.    Dispense:  5 g    Refill:  5  . mometasone (NASONEX) 50 MCG/ACT nasal spray    Sig: 1 SPRAY 1-2 TIMES PER DAY AS NEEDED.    Dispense:  17 g    Refill:  5  . Olopatadine HCl (PAZEO) 0.7 % SOLN    Sig: Apply 1 drop to eye daily.    Dispense:  2.5 mL    Refill:  5      Physical examination: Blood pressure 112/76, pulse 68, resp. rate 18, height 5' 3.39" (1.61 m), weight 139 lb 15.9 oz (63.5 kg).  General: Alert, interactive, in no acute distress. HEENT: TMs pearly gray, turbinates edematous without discharge, post-pharynx erythematous.  Sublingual ulcer. Neck: Supple without lymphadenopathy. Lungs: Clear to auscultation without wheezing, rhonchi or rales. CV: Normal S1, S2 without murmurs. Skin: Warm and dry, without lesions or rashes.  The following portions of the patient's history were reviewed and updated as appropriate: allergies, current medications, past family history, past medical history, past social history, past surgical history and problem list.    Medication List       This list is accurate as of: 05/13/15  6:37 PM.  Always use your most recent med list.  benzonatate 200 MG capsule  Commonly known as:  TESSALON  Take 1 capsule (200 mg total) by mouth 3 (three) times daily as needed for cough.     Biotin 1000 MCG tablet  Take 1,000 mcg by mouth 3 (three) times daily. Reported on 05/13/2015     CALCIUM 600 PO  Take by mouth daily. Reported on 05/13/2015     chlorpheniramine-HYDROcodone 10-8 MG/5ML Suer  Commonly known as:  TUSSIONEX PENNKINETIC ER  Take 5 mLs by mouth at bedtime as needed for cough.     dexlansoprazole 60 MG  capsule  Commonly known as:  DEXILANT  Take 60 mg by mouth daily. Reported on 05/13/2015     mometasone 50 MCG/ACT nasal spray  Commonly known as:  NASONEX  1 SPRAY 1-2 TIMES PER DAY AS NEEDED.     montelukast 10 MG tablet  Commonly known as:  SINGULAIR  Take 1 tablet (10 mg total) by mouth at bedtime.     neomycin-polymyxin-dexamethasone 0.1 % ophthalmic suspension  Commonly known as:  MAXITROL  Place 1 drop into both eyes 2 (two) times daily.     Olopatadine HCl 0.7 % Soln  Commonly known as:  PAZEO  Apply 1 drop to eye daily.     omeprazole 20 MG capsule  Commonly known as:  PRILOSEC  Take 1 capsule (20 mg total) by mouth 2 (two) times daily.     oxybutynin 5 MG 24 hr tablet  Commonly known as:  DITROPAN-XL  Take 1 tablet (5 mg total) by mouth at bedtime.     ranitidine 150 MG tablet  Commonly known as:  ZANTAC  Take 150 mg by mouth at bedtime. Reported on 05/13/2015     traMADol 50 MG tablet  Commonly known as:  ULTRAM  Take 50 mg by mouth 2 (two) times daily as needed. Reported on 05/13/2015     triamcinolone 0.1 % paste  Commonly known as:  KENALOG  USE TWICE A DAY AS NEEDED.     vitamin B-12 1000 MCG tablet  Commonly known as:  CYANOCOBALAMIN  Take 1,000 mcg by mouth daily. Reported on 05/13/2015     VITAMIN D PO  Take by mouth daily. Reported on 05/13/2015        Allergies  Allergen Reactions  . Darvocet [Propoxyphene N-Acetaminophen]     Patient stated that medication made her heart beat fast.  . Morphine And Related     Patient stated that medication made her heart beat fast.   Review of systems: Constitutional: Negative for fever, chills and weight loss.  HENT: Negative for nosebleeds.   Positive for mouth ulcers.  Positive for postnasal drainage, sore throat, globus sensation. Eyes: Negative for blurred vision.  Respiratory: Negative for hemoptysis.   Cardiovascular: Negative for chest pain.  Gastrointestinal: Negative for diarrhea and  constipation.  Genitourinary: Negative for dysuria.  Musculoskeletal: Negative for myalgias and joint pain.  Neurological: Negative for dizziness.  Endo/Heme/Allergies: Does not bruise/bleed easily.   Past Medical History  Diagnosis Date  . Hyperlipidemia   . Gastroesophageal reflux   . Hiatal hernia 1989    status post Nissen fundoplication   . Hx of hysterectomy   . Tachycardia     Patient stated that this has been occuring frequently.  . Arthritis   . Allergy   . Thyroid disease     Goiter    Family History  Problem Relation Age of Onset  . Stroke Mother 21  . Arthritis Mother   .  Heart disease Mother   . Hypertension Mother   . Stroke Father [id number]  . Hypertension Father   . Rheumatic fever Brother     and multiple open heart surgeries  . Diabetes Brother     type 2  . Stroke Brother   . Other Sister     Coronary Atherosclerosis  . Stroke Brother   . Stroke Sister   . Heart disease Sister   . Dementia Sister   . Cancer Sister     ovarian    Social History   Social History  . Marital Status: Married    Spouse Name: N/A  . Number of Children: N/A  . Years of Education: N/A   Occupational History  . Retired Other   Social History Main Topics  . Smoking status: Never Smoker   . Smokeless tobacco: Never Used  . Alcohol Use: No  . Drug Use: No  . Sexual Activity: Not on file   Other Topics Concern  . Not on file   Social History Narrative   Occasionally drinks coffee.    I appreciate the opportunity to take part in this Carrie Noble's care. Please do not hesitate to contact me with questions.  Sincerely,   R. Jorene Guestarter Janika Jedlicka, MD

## 2015-05-13 NOTE — Assessment & Plan Note (Addendum)
   Based upon patient preference, we will discontinue azelastine at this time.  A prescription has been provided for Nasonex nasal spray, one spray per nostril 1-2 times daily as needed. Proper nasal spray technique has been discussed and demonstrated.  For thick postnasal drainage I have recommended guaifenesin 1200 mg twice daily as needed with adequate hydration as discussed.   Nasal saline lavage (NeilMed) as needed has been recommended along with instructions for proper administration.

## 2015-05-14 ENCOUNTER — Encounter: Payer: Self-pay | Admitting: *Deleted

## 2015-05-15 ENCOUNTER — Telehealth: Payer: Self-pay

## 2015-05-15 MED ORDER — AZELASTINE HCL 0.05 % OP SOLN: 1.0000 [drp] | Freq: Two times a day (BID) | OPHTHALMIC | Status: DC

## 2015-05-15 NOTE — Telephone Encounter (Signed)
Patient returned called advised per Dr Levada SchillingBobbitt Pazeo is being switched to [employer] since this is covered sent in to pharmacy patient aware

## 2015-05-15 NOTE — Telephone Encounter (Signed)
Called no answer on her number and husbands number

## 2015-05-15 NOTE — Telephone Encounter (Signed)
Insurance will not cover Pazeo.  Please Advise  Thanks  Seen on 05/13/15-Bobbitt  If she doesn't answer home # please call Husbands cell [phone]

## 2015-06-19 ENCOUNTER — Telehealth: Payer: Self-pay

## 2015-06-19 NOTE — Telephone Encounter (Signed)
Patient last seen on 05/13/15 with Dr. Nunzio CobbsBobbitt. She is still having some issues. She has been continuing everything Dr. Nunzio CobbsBobbitt Prescribed. She has been taking her nasonex and eye drops everyday . Her throat is very sore on the left side, she is very horse. She doesn't have any fevers. She doesn't know of anything else to do.  Please Advise  Thanks

## 2015-06-19 NOTE — Telephone Encounter (Signed)
Spoke to patient and informed her that she will need to come in to be seen or she could go to her primary doctor.  She asked if she could take her montelukast with her other allergy medication and I told her that she could.  She stated that she will call the office on Tuesday to give and update on how she is feeling.

## 2015-06-19 NOTE — Telephone Encounter (Signed)
Left message to call office

## 2015-06-23 ENCOUNTER — Telehealth: Payer: Self-pay | Admitting: Allergy and Immunology

## 2015-06-23 NOTE — Telephone Encounter (Signed)
We do not have stronger eyedrops than Pazeo, she may have to see an opthalmologist about the ocular symptoms. Regarding the sore throat nasal saline lavage (NeilMed) as needed is recommended along with guaifenesin (Mucinex) [phone] mg twice daily as needed with adequate hydration.

## 2015-06-23 NOTE — Telephone Encounter (Signed)
Lm for pt to call us back  

## 2015-06-23 NOTE — Telephone Encounter (Signed)
Pt called and said that she was still sick with sore throat on one side and her eyes are still giving her problems and she said that she has finish her meds.  [facility]

## 2015-06-23 NOTE — Telephone Encounter (Signed)
Please advise 

## 2015-06-24 NOTE — Telephone Encounter (Signed)
Spoke with pt and informed her of what you have stated

## 2015-07-24 ENCOUNTER — Encounter: Payer: Medicare Other | Admitting: Internal Medicine

## 2015-09-29 ENCOUNTER — Other Ambulatory Visit: Payer: Self-pay | Admitting: *Deleted

## 2015-09-29 MED ORDER — OMEPRAZOLE 20 MG PO CPDR: 20.0000 mg | DELAYED_RELEASE_CAPSULE | Freq: Two times a day (BID) | ORAL | 0 refills | Status: DC

## 2015-10-09 ENCOUNTER — Encounter: Payer: Self-pay | Admitting: Internal Medicine

## 2015-10-09 ENCOUNTER — Ambulatory Visit (INDEPENDENT_AMBULATORY_CARE_PROVIDER_SITE_OTHER): Payer: Medicare Other | Admitting: Internal Medicine

## 2015-10-09 VITALS — BP 118/62 | HR 70 | Temp 98.1°F | Ht 63.0 in | Wt 139.6 lb

## 2015-10-09 DIAGNOSIS — Z0001 Encounter for general adult medical examination with abnormal findings: Secondary | ICD-10-CM

## 2015-10-09 DIAGNOSIS — M25551 Pain in right hip: Secondary | ICD-10-CM

## 2015-10-09 DIAGNOSIS — M542 Cervicalgia: Secondary | ICD-10-CM

## 2015-10-09 DIAGNOSIS — N644 Mastodynia: Secondary | ICD-10-CM

## 2015-10-09 DIAGNOSIS — K2270 Barrett's esophagus without dysplasia: Secondary | ICD-10-CM

## 2015-10-09 DIAGNOSIS — E049 Nontoxic goiter, unspecified: Secondary | ICD-10-CM

## 2015-10-09 DIAGNOSIS — R413 Other amnesia: Secondary | ICD-10-CM

## 2015-10-09 DIAGNOSIS — E785 Hyperlipidemia, unspecified: Secondary | ICD-10-CM | POA: Diagnosis not present

## 2015-10-09 DIAGNOSIS — K219 Gastro-esophageal reflux disease without esophagitis: Secondary | ICD-10-CM

## 2015-10-09 DIAGNOSIS — Z0000 Encounter for general adult medical examination without abnormal findings: Secondary | ICD-10-CM

## 2015-10-09 MED ORDER — TIZANIDINE HCL 4 MG PO TABS: 4.0000 mg | ORAL_TABLET | Freq: Every evening | ORAL | 0 refills | Status: DC | PRN

## 2015-10-09 NOTE — Assessment & Plan Note (Addendum)
Physical today 10/09/15.  Mammogram 09/15/14 - Birads I.  Schedule f/u diagnostic mammogram as outlined.  Colonoscopy 02/2013.  Recommended f/u colonoscopy in 10 years.

## 2015-10-09 NOTE — Progress Notes (Signed)
Patient ID: Carrie Noble, female   DOB: August 05, 1940, 75 y.o.   MRN: [id number]   Subjective:    Patient ID: Carrie Noble, female    DOB: 1940-09-13, 75 y.o.   MRN: [id number]  HPI  Patient here for her physical exam.  She has noticed some pain and limited rom - neck - right side.  Also reports some left hip discomfort.  Takes tramadol prn.  Feels needs something more.  No chest pain.  Breathing stable.  No abdominal pain.  Saw urology.  Prescribed ditropan.  Is concerned regarding some persistent intermittent breast tenderness.  Now localized right breast.  Also reports having trouble with her memory.  Discussed with her today.     Past Medical History:  Diagnosis Date  . Allergy   . Arthritis   . Gastroesophageal reflux   . Hiatal hernia 1989   status post Nissen fundoplication   . Hx of hysterectomy   . Hyperlipidemia   . Tachycardia    Patient stated that this has been occuring frequently.  . Thyroid disease    Goiter   Past Surgical History:  Procedure Laterality Date  . ABDOMINAL HYSTERECTOMY  1980   partial  . BREAST BIOPSY Right    neg  . BREAST EXCISIONAL BIOPSY Left 2014   neg  . COLONOSCOPY  2015  . ESOPHAGOGASTRIC FUNDOPLICATION  1999  . LAPAROSCOPIC NISSEN FUNDOPLICATION  1999  . TONSILLECTOMY     as well as goiter  . UPPER GI ENDOSCOPY  2015   Family History  Problem Relation Age of Onset  . Stroke Mother 68  . Arthritis Mother   . Heart disease Mother   . Hypertension Mother   . Stroke Father 51  . Hypertension Father   . Rheumatic fever Brother     and multiple open heart surgeries  . Diabetes Brother     type 2  . Stroke Brother   . Other Sister     Coronary Atherosclerosis  . Stroke Brother   . Stroke Sister   . Heart disease Sister   . Dementia Sister   . Cancer Sister     ovarian   Social History   Social History  . Marital status: Married    Spouse name: N/A  . Number of children: N/A  . Years of education: N/A   Occupational  History  . Retired Other   Social History Main Topics  . Smoking status: Never Smoker  . Smokeless tobacco: Never Used  . Alcohol use No  . Drug use: No  . Sexual activity: Not Asked   Other Topics Concern  . None   Social History Narrative   Occasionally drinks coffee.    Outpatient Encounter Prescriptions as of 10/09/2015  Medication Sig  . azelastine (OPTIVAR) 0.05 % ophthalmic solution Place 1 drop into both eyes 2 (two) times daily.  . benzonatate (TESSALON) 200 MG capsule Take 1 capsule (200 mg total) by mouth 3 (three) times daily as needed for cough.  . Biotin 1000 MCG tablet Take 1,000 mcg by mouth 3 (three) times daily. Reported on 05/13/2015  . Calcium Carbonate (CALCIUM 600 PO) Take by mouth daily. Reported on 05/13/2015  . chlorpheniramine-HYDROcodone (TUSSIONEX PENNKINETIC ER) 10-8 MG/5ML SUER Take 5 mLs by mouth at bedtime as needed for cough.  . Cholecalciferol (VITAMIN D PO) Take by mouth daily. Reported on 05/13/2015  . dexlansoprazole (DEXILANT) 60 MG capsule Take 60 mg by mouth daily. Reported on 05/13/2015  .  mometasone (NASONEX) 50 MCG/ACT nasal spray 1 SPRAY 1-2 TIMES PER DAY AS NEEDED.  Marland Kitchen montelukast (SINGULAIR) 10 MG tablet Take 1 tablet (10 mg total) by mouth at bedtime.  Marland Kitchen neomycin-polymyxin-dexamethasone (MAXITROL) 0.1 % ophthalmic suspension Place 1 drop into both eyes 2 (two) times daily.  . Olopatadine HCl (PAZEO) 0.7 % SOLN Apply 1 drop to eye daily.  Marland Kitchen omeprazole (PRILOSEC) 20 MG capsule Take 1 capsule (20 mg total) by mouth 2 (two) times daily. MUST KEEP APPT WITH DR. Lorin Picket FOR FURTHER REFILLS  . oxybutynin (DITROPAN-XL) 5 MG 24 hr tablet Take 1 tablet (5 mg total) by mouth at bedtime.  . ranitidine (ZANTAC) 150 MG tablet Take 150 mg by mouth at bedtime. Reported on 05/13/2015  . traMADol (ULTRAM) 50 MG tablet Take 50 mg by mouth 2 (two) times daily as needed. Reported on 05/13/2015  . triamcinolone (KENALOG) 0.1 % paste USE TWICE A DAY AS NEEDED.  Marland Kitchen  vitamin B-12 (CYANOCOBALAMIN) 1000 MCG tablet Take 1,000 mcg by mouth daily. Reported on 05/13/2015  . tiZANidine (ZANAFLEX) 4 MG tablet Take 1 tablet (4 mg total) by mouth at bedtime as needed for muscle spasms.   No facility-administered encounter medications on file as of 10/09/2015.     Review of Systems  Constitutional: Negative for appetite change and unexpected weight change.  HENT: Negative for congestion and sinus pressure.   Eyes: Negative for pain and visual disturbance.  Respiratory: Negative for cough, chest tightness and shortness of breath.   Cardiovascular: Negative for chest pain, palpitations and leg swelling.  Gastrointestinal: Negative for abdominal pain, diarrhea, nausea and vomiting.  Genitourinary: Negative for difficulty urinating.       Saw urology.  Recommended ditropan.   Musculoskeletal: Negative for back pain and joint swelling.  Skin: Negative for color change and rash.  Neurological: Negative for dizziness and headaches.       Memory change as outlined.   Hematological: Negative for adenopathy. Does not bruise/bleed easily.  Psychiatric/Behavioral: Negative for agitation and dysphoric mood.       Objective:    Physical Exam  Constitutional: She is oriented to person, place, and time. She appears well-developed and well-nourished. No distress.  HENT:  Nose: Nose normal.  Mouth/Throat: Oropharynx is clear and moist.  Eyes: Conjunctivae are normal. Right eye exhibits no discharge. Left eye exhibits no discharge.  Neck: Neck supple. No thyromegaly present.  Cardiovascular: Normal rate and regular rhythm.   Pulmonary/Chest: Breath sounds normal. No respiratory distress.  No nipple discharge or nipple retraction present.  Tenderness 9:00 right breast.  No palpable nodule.  Could not appreciate any axillary adenopathy.    Abdominal: Soft. Bowel sounds are normal. There is no tenderness.  Musculoskeletal: Normal range of motion. She exhibits no edema.    Lymphadenopathy:    She has no cervical adenopathy.  Neurological: She is alert and oriented to person, place, and time.  Skin: No rash noted. No erythema.  Psychiatric: She has a normal mood and affect. Her behavior is normal.    BP 118/62   Pulse 70   Temp 98.1 F (36.7 C) (Oral)   Ht 5\' 3"  (1.6 m)   Wt 139 lb 9.6 oz (63.3 kg)   SpO2 96%   BMI 24.73 kg/m  Wt Readings from Last 3 Encounters:  10/09/15 139 lb 9.6 oz (63.3 kg)  05/13/15 139 lb 15.9 oz (63.5 kg)  05/06/15 143 lb (64.9 kg)     Lab Results  Component Value Date  WBC 5.1 05/12/2015   HGB 13.3 05/12/2015   HCT 38.5 05/12/2015   PLT 328.0 05/12/2015   GLUCOSE 92 05/12/2015   CHOL 207 (H) 05/12/2015   TRIG 186.0 (H) 05/12/2015   HDL 38.50 (L) 05/12/2015   LDLDIRECT 152.2 11/07/2013   LDLCALC 132 (H) 05/12/2015   ALT 18 05/12/2015   AST 19 05/12/2015   NA 140 05/12/2015   K 4.1 05/12/2015   CL 104 05/12/2015   CREATININE 0.83 05/12/2015   BUN 13 05/12/2015   CO2 30 05/12/2015   TSH 3.76 05/12/2015   HGBA1C 5.7 03/29/2013    Dg Ugi W/small Bowel  Result Date: 04/07/2015 CLINICAL DATA:  Dyspepsia, frequent and recurring dysphagia EXAM: UPPER GI SERIES WITH SMALL BOWEL FOLLOW-THROUGH FLUOROSCOPY TIME:  Radiation Exposure Index (as provided by the fluoroscopic device): 24.4 mGy TECHNIQUE: Combined double contrast and single contrast upper GI series using effervescent crystals, thick barium, and thin barium. Subsequently, serial images of the small bowel were obtained including spot views of the terminal ileum. COMPARISON:  None. FINDINGS: Examination of the esophagus demonstrated normal esophageal motility. Normal esophageal morphology without evidence of esophagitis or ulceration. No esophageal stricture, diverticula, or mass lesion. There is a small hiatal hernia. There is moderate spontaneous gastroesophageal reflux. Examination of the stomach demonstrated normal rugal folds and areae gastricae. The gastric  mucosa appeared unremarkable without evidence of ulceration, scarring, or mass lesion. Gastric motility and emptying was normal. Fluoroscopic examination of the duodenum demonstrates normal motility and morphology without evidence of ulceration or mass lesion. Medium density barium was periodically observed under fluoroscopy to travel from the stomach to the ascending colon (over a minute time period). There is no evidence of small bowel stricture or obstruction. No large filling defects to suggest mass lesion. In addition, there is no evidence of tethering or definite inflammatory changes present within the small bowel. At the end of the examination a 13 mm barium tablet was administered which transited through the esophagus and esophagogastric junction without delay. IMPRESSION: 1. Small hiatal hernia. 2. Moderate spontaneous gastroesophageal reflux. 3. Normal small bowel follow-through. Electronically Signed   By: Elige KoHetal  Patel   On: 04/07/2015 10:18       Assessment & Plan:   Problem List Items Addressed This Visit    Barrett's esophagus    EGD 03/15/13 as outlined.  Saw GI.  Refer to their notes.  On omeprazole.  Recommended f/u EGD in 3 years.        Breast tenderness in female    Breast tenderness as outlined.  Schedule diagnostic mammogram with ultrasound as outlined.  Follow.       GERD (gastroesophageal reflux disease)    On omeprazole.  Upper symptoms controlled.  Follow.       Relevant Orders   CBC with Differential/Platelet   Health care maintenance    Physical today 10/09/15.  Mammogram 09/15/14 - Birads I.  Schedule f/u diagnostic mammogram as outlined.  Colonoscopy 02/2013.  Recommended f/u colonoscopy in 10 years.        Hyperlipidemia    Low cholesterol diet and exercise.  Follow  Lipid panel.       Relevant Orders   Lipid panel   Hepatic function panel   Basic metabolic panel   Memory change    Memory change as outlined.  Check routine labs including B12.  Follow.          Relevant Orders   Vitamin B12   Neck pain  Limited rom and discomfort.  Tizanidine as directed.  Follow.  Do not take mr with tramadol.  Discussed possible side effects.        Pain in right hip    S/p injection - Dr Gavin Potters.  Has tramadol if needed.  Discussed referral back.  Hold.  Has tramadol if needed.        Thyroid goiter    Followed by Dr Renae Fickle.  Last seen 11/2014.  Recommended f/u in one year.        Relevant Orders   TSH    Other Visit Diagnoses    Breast pain, right    -  Primary   Relevant Orders   MM Digital Diagnostic Bilat   US BREAST LTD UNI LEFT INC AXILLA   US BREAST LTD UNI RIGHT INC AXILLA       Dale [place], MD

## 2015-10-09 NOTE — Progress Notes (Signed)
Pre visit review using our clinic review tool, if applicable. No additional management support is needed unless otherwise documented below in the visit note. 

## 2015-10-11 ENCOUNTER — Encounter: Payer: Self-pay | Admitting: Internal Medicine

## 2015-10-11 DIAGNOSIS — M542 Cervicalgia: Secondary | ICD-10-CM | POA: Insufficient documentation

## 2015-10-11 DIAGNOSIS — R413 Other amnesia: Secondary | ICD-10-CM | POA: Insufficient documentation

## 2015-10-11 NOTE — Assessment & Plan Note (Signed)
Followed by Dr Renae FicklePaul.  Last seen 11/2014.  Recommended f/u in one year.

## 2015-10-11 NOTE — Assessment & Plan Note (Signed)
Limited rom and discomfort.  Tizanidine as directed.  Follow.  Do not take mr with tramadol.  Discussed possible side effects.

## 2015-10-11 NOTE — Assessment & Plan Note (Signed)
EGD 03/15/13 as outlined.  Saw GI.  Refer to their notes.  On omeprazole.  Recommended f/u EGD in 3 years.

## 2015-10-11 NOTE — Assessment & Plan Note (Signed)
Low cholesterol diet and exercise.  Follow  Lipid panel.   

## 2015-10-11 NOTE — Assessment & Plan Note (Signed)
S/p injection - Dr [facility].  Has tramadol if needed.  Discussed referral back.  Hold.  Has tramadol if needed.

## 2015-10-11 NOTE — Assessment & Plan Note (Signed)
Breast tenderness as outlined.  Schedule diagnostic mammogram with ultrasound as outlined.  Follow.

## 2015-10-11 NOTE — Assessment & Plan Note (Signed)
On omeprazole.  Upper symptoms controlled.  Follow.

## 2015-10-11 NOTE — Assessment & Plan Note (Signed)
Memory change as outlined.  Check routine labs including B12.  Follow.

## 2015-10-19 ENCOUNTER — Other Ambulatory Visit: Payer: Medicare Other

## 2015-10-21 ENCOUNTER — Other Ambulatory Visit (INDEPENDENT_AMBULATORY_CARE_PROVIDER_SITE_OTHER): Payer: Medicare Other

## 2015-10-21 ENCOUNTER — Telehealth: Payer: Self-pay | Admitting: Internal Medicine

## 2015-10-21 DIAGNOSIS — E785 Hyperlipidemia, unspecified: Secondary | ICD-10-CM

## 2015-10-21 DIAGNOSIS — R413 Other amnesia: Secondary | ICD-10-CM

## 2015-10-21 DIAGNOSIS — K219 Gastro-esophageal reflux disease without esophagitis: Secondary | ICD-10-CM

## 2015-10-21 DIAGNOSIS — E049 Nontoxic goiter, unspecified: Secondary | ICD-10-CM

## 2015-10-21 LAB — BASIC METABOLIC PANEL
BUN: 15 mg/dL (ref 6–23)
CHLORIDE: 103 meq/L (ref 96–112)
CO2: 29 mEq/L (ref 19–32)
Calcium: 9.2 mg/dL (ref 8.4–10.5)
Creatinine, Ser: 0.99 mg/dL (ref 0.40–1.20)
GFR: 57.89 mL/min — ABNORMAL LOW (ref >60.00)
GLUCOSE: 92 mg/dL (ref 70–99)
POTASSIUM: 4 meq/L (ref 3.5–5.1)
Sodium: 140 mEq/L (ref 135–145)

## 2015-10-21 LAB — CBC WITH DIFFERENTIAL/PLATELET
Basophils Absolute: 0 10*3/uL (ref 0.0–0.1)
Basophils Relative: 0.6 % (ref 0.0–3.0)
EOS PCT: 3.7 % (ref 0.0–5.0)
Eosinophils Absolute: 0.2 10*3/uL (ref 0.0–0.7)
HCT: 40 % (ref 36.0–46.0)
Hemoglobin: 13.8 g/dL (ref 12.0–15.0)
LYMPHS ABS: 2 10*3/uL (ref 0.7–4.0)
Lymphocytes Relative: 40.3 % (ref 12.0–46.0)
MCHC: 34.6 g/dL (ref 30.0–36.0)
MCV: 92.9 fl (ref 78.0–100.0)
MONOS PCT: 7.8 % (ref 3.0–12.0)
Monocytes Absolute: 0.4 10*3/uL (ref 0.1–1.0)
NEUTROS ABS: 2.4 10*3/uL (ref 1.4–7.7)
NEUTROS PCT: 47.6 % (ref 43.0–77.0)
PLATELETS: 367 10*3/uL (ref 150.0–400.0)
RBC: 4.3 Mil/uL (ref 3.87–5.11)
RDW: 13.5 % (ref 11.5–15.5)
WBC: 5 10*3/uL (ref 4.0–10.5)

## 2015-10-21 LAB — LIPID PANEL
CHOL/HDL RATIO: 5
Cholesterol: 250 mg/dL — ABNORMAL HIGH (ref 0–200)
HDL: 51.1 mg/dL (ref >39.00)
LDL Cholesterol: 165 mg/dL — ABNORMAL HIGH (ref 0–99)
NONHDL: 198.66
Triglycerides: 166 mg/dL — ABNORMAL HIGH (ref 0.0–149.0)
VLDL: 33.2 mg/dL (ref 0.0–40.0)

## 2015-10-21 LAB — HEPATIC FUNCTION PANEL
ALBUMIN: 4.2 g/dL (ref 3.5–5.2)
ALK PHOS: 112 U/L (ref 39–117)
ALT: 20 U/L (ref 0–35)
AST: 20 U/L (ref 0–37)
BILIRUBIN DIRECT: 0 mg/dL (ref 0.0–0.3)
Total Bilirubin: 0.7 mg/dL (ref 0.2–1.2)
Total Protein: 7.3 g/dL (ref 6.0–8.3)

## 2015-10-21 LAB — TSH: TSH: 2.48 u[IU]/mL (ref 0.35–4.50)

## 2015-10-21 LAB — VITAMIN B12: Vitamin B-12: 500 pg/mL (ref 211–911)

## 2015-10-21 NOTE — Telephone Encounter (Signed)
Has she seen her dentist?  If yes and still persistent, I can refer to ENT.  Just let me know if desires referral.

## 2015-10-21 NOTE — Telephone Encounter (Signed)
Pt had a cap put on by her dentist and will have to go back to have it fixed. Pt stated she wanted to wait on ENT referral due to the fact she has been taking 20 mg of omeprazole but went back to 40 mg- not sure if that has anything to do with current problem. She wondered if you recommended a change to a different medication. She states she has a metal taste in her mouth but states that nothing is coming up into her mouth??    She is having problems holding her bladder and once she gets the urge to urinate she is not always able to make to the restroom. She talked about seeing OB-GYN for this but wanted your recommendations.

## 2015-10-21 NOTE — Telephone Encounter (Signed)
LVTCB

## 2015-10-21 NOTE — Telephone Encounter (Signed)
The patient left a message to give her a call back.

## 2015-10-21 NOTE — Telephone Encounter (Signed)
Pt came in for labs and filled out a triage form. She states that she is still having dry mouth, bad taste in the mouth, and no appetite. She thought she was doing better after cutting out past toothpaste (senodine), but has not given it up.

## 2015-10-21 NOTE — Telephone Encounter (Signed)
I am ok with referral to gyn if she desires.  Also, I would try the 40mg  dose daily and see if this helps - prior to changing medication.  If persistent problem, needs to be reevaluated.

## 2015-10-21 NOTE — Telephone Encounter (Signed)
Pt was given information and stated no referral was needed. She will call Monday to update on omeprazole.

## 2015-10-22 ENCOUNTER — Telehealth: Payer: Self-pay | Admitting: Internal Medicine

## 2015-10-22 NOTE — Telephone Encounter (Signed)
Pt called returning your call regarding labs. Thank you!  Call pt @ [phone]

## 2015-10-26 ENCOUNTER — Ambulatory Visit
Admission: RE | Admit: 2015-10-26 | Discharge: 2015-10-26 | Disposition: A | Discharge status: Home / Self Care | Payer: Medicare Other | Source: Ambulatory Visit | Attending: Internal Medicine | Admitting: Internal Medicine

## 2015-10-26 ENCOUNTER — Other Ambulatory Visit: Payer: Self-pay | Admitting: Internal Medicine

## 2015-10-26 DIAGNOSIS — N644 Mastodynia: Secondary | ICD-10-CM

## 2015-10-30 ENCOUNTER — Ambulatory Visit (INDEPENDENT_AMBULATORY_CARE_PROVIDER_SITE_OTHER): Payer: Medicare Other

## 2015-10-30 VITALS — BP 118/62 | HR 74 | Temp 97.7°F | Resp 12 | Ht 63.0 in | Wt 138.8 lb

## 2015-10-30 DIAGNOSIS — Z0000 Encounter for general adult medical examination without abnormal findings: Secondary | ICD-10-CM | POA: Diagnosis not present

## 2015-10-30 NOTE — Patient Instructions (Addendum)
  [name] , Thank you for taking time to come for your Medicare Wellness Visit. I appreciate your ongoing commitment to your health goals. Please review the following plan we discussed and let me know if I can assist you in the future.   These are the goals we discussed: Goals    . Increase physical activity          Use stationary bike as often as possible. Chair exercises as demonstrated.      . Increase water intake       This is a list of the screening recommended for you and due dates:  Health Maintenance  Topic Date Due  . Shingles Vaccine  05/12/1999  . Pneumonia vaccines (1 of 2 - PCV13) 05/11/2004  . Flu Shot  04/23/2016*  . Mammogram  10/25/2016  . Tetanus Vaccine  09/20/2023  . DEXA scan (bone density measurement)  Completed  *Topic was postponed. The date shown is not the original due date.

## 2015-10-30 NOTE — Progress Notes (Signed)
Subjective:   Carrie Noble is a 75 y.o. female who presents for Medicare Annual (Subsequent) preventive examination.  Review of Systems:  No ROS.  Medicare Wellness Visit.  Cardiac Risk Factors include: advanced age (>8men, >58 women)     Objective:     Vitals: BP 118/62 (BP Location: Left Arm, Patient Position: Sitting, Cuff Size: Normal)   Pulse 74   Temp 97.7 F (36.5 C) (Oral)   Resp 12   Ht 5\' 3"  (1.6 m)   Wt 138 lb 12.8 oz (63 kg)   SpO2 97%   BMI 24.59 kg/m   Body mass index is 24.59 kg/m.   Tobacco History  Smoking Status  . Never Smoker  Smokeless Tobacco  . Never Used     Counseling given: Not Answered   Past Medical History:  Diagnosis Date  . Allergy   . Arthritis   . Gastroesophageal reflux   . Hiatal hernia 1989   status post Nissen fundoplication   . Hx of hysterectomy   . Hyperlipidemia   . Tachycardia    Patient stated that this has been occuring frequently.  . Thyroid disease    Goiter   Past Surgical History:  Procedure Laterality Date  . ABDOMINAL HYSTERECTOMY  1980   partial  . BREAST BIOPSY Right    neg  . BREAST EXCISIONAL BIOPSY Left 2014   neg  . COLONOSCOPY  2015  . ESOPHAGOGASTRIC FUNDOPLICATION  1999  . LAPAROSCOPIC NISSEN FUNDOPLICATION  1999  . TONSILLECTOMY     as well as goiter  . UPPER GI ENDOSCOPY  2015   Family History  Problem Relation Age of Onset  . Stroke Mother 41  . Arthritis Mother   . Heart disease Mother   . Hypertension Mother   . Stroke Father 3  . Hypertension Father   . Rheumatic fever Brother     and multiple open heart surgeries  . Diabetes Brother     type 2  . Stroke Brother   . Other Sister     Coronary Atherosclerosis  . Stroke Brother   . Stroke Sister   . Heart disease Sister   . Dementia Sister   . Cancer Sister     ovarian   History  Sexual Activity  . Sexual activity: No    Outpatient Encounter Prescriptions as of 10/30/2015  Medication Sig  . Biotin 1000 MCG  tablet Take 1,000 mcg by mouth 3 (three) times daily. Reported on 05/13/2015  . Calcium Carbonate (CALCIUM 600 PO) Take by mouth daily. Reported on 05/13/2015  . chlorpheniramine-HYDROcodone (TUSSIONEX PENNKINETIC ER) 10-8 MG/5ML SUER Take 5 mLs by mouth at bedtime as needed for cough.  . Cholecalciferol (VITAMIN D PO) Take by mouth daily. Reported on 05/13/2015  . dexlansoprazole (DEXILANT) 60 MG capsule Take 60 mg by mouth daily. Reported on 05/13/2015  . mometasone (NASONEX) 50 MCG/ACT nasal spray 1 SPRAY 1-2 TIMES PER DAY AS NEEDED.  Marland Kitchen montelukast (SINGULAIR) 10 MG tablet Take 1 tablet (10 mg total) by mouth at bedtime.  Marland Kitchen neomycin-polymyxin-dexamethasone (MAXITROL) 0.1 % ophthalmic suspension Place 1 drop into both eyes 2 (two) times daily.  . Olopatadine HCl (PAZEO) 0.7 % SOLN Apply 1 drop to eye daily.  Marland Kitchen omeprazole (PRILOSEC) 20 MG capsule Take 1 capsule (20 mg total) by mouth 2 (two) times daily. MUST KEEP APPT WITH DR. Lorin Picket FOR FURTHER REFILLS  . ranitidine (ZANTAC) 150 MG tablet Take 150 mg by mouth at bedtime. Reported on  05/13/2015  . tiZANidine (ZANAFLEX) 4 MG tablet Take 1 tablet (4 mg total) by mouth at bedtime as needed for muscle spasms.  . traMADol (ULTRAM) 50 MG tablet Take 50 mg by mouth 2 (two) times daily as needed. Reported on 05/13/2015  . vitamin B-12 (CYANOCOBALAMIN) 1000 MCG tablet Take 1,000 mcg by mouth daily. Reported on 05/13/2015  . [DISCONTINUED] oxybutynin (DITROPAN-XL) 5 MG 24 hr tablet Take 1 tablet (5 mg total) by mouth at bedtime.  . [DISCONTINUED] triamcinolone (KENALOG) 0.1 % paste USE TWICE A DAY AS NEEDED.  . [DISCONTINUED] azelastine (OPTIVAR) 0.05 % ophthalmic solution Place 1 drop into both eyes 2 (two) times daily.  . [DISCONTINUED] benzonatate (TESSALON) 200 MG capsule Take 1 capsule (200 mg total) by mouth 3 (three) times daily as needed for cough.   No facility-administered encounter medications on file as of 10/30/2015.     Activities of Daily  Living In your present state of health, do you have any difficulty performing the following activities: 10/30/2015  Hearing? Y  Vision? N  Difficulty concentrating or making decisions? Y  Walking or climbing stairs? N  Dressing or bathing? N  Doing errands, shopping? N  Preparing Food and eating ? N  Using the Toilet? N  In the past six months, have you accidently leaked urine? Y  Do you have problems with loss of bowel control? N  Managing your Medications? N  Managing your Finances? N  Housekeeping or managing your Housekeeping? N  Some recent data might be hidden    Patient Care Team: Dale Claysburg, MD as PCP - General (Internal Medicine) Earline Mayotte, MD (General Surgery)    Assessment:    This is a routine wellness examination for Carrie Noble. The goal of the wellness visit is to assist the patient how to close the gaps in care and create a preventative care plan for the patient.   Taking calcium VIT D as appropriate/Osteoporosis reviewed.  Medications reviewed; taking without issues or barriers.  Safety issues reviewed; lives with husband.  Smoke detectors in the home. No firearms in the home. Wears seatbelts when driving or riding with others. No violence in the home.  No identified risk were noted; The patient was oriented x 3; appropriate in dress and manner and no objective failures at ADL's or IADL's.   Body mass index; normal.  Discussed the importance of a healthy diet, water intake and exercise. Educational material provided.  Influenza, Prevnar 13, Zostavax vaccine postponed per patient request.  Patient Concerns: None at this time.  Follow up with PCP as needed.  Exercise Activities and Dietary recommendations Current Exercise Habits: Home exercise routine, Type of exercise: walking, Time (Minutes): 10, Frequency (Times/Week): 1, Weekly Exercise (Minutes/Week): 10, Intensity: Mild  Goals    . Increase physical activity          Use stationary bike  as often as possible. Chair exercises as demonstrated.      . Increase water intake      Fall Risk Fall Risk  10/30/2015 10/09/2015 12/23/2013 09/19/2013 09/19/2013  Falls in the past year? No Yes Yes No No  Number falls in past yr: - 1 1 - -  Injury with Fall? - No Yes - -   Depression Screen PHQ 2/9 Scores 10/30/2015 10/09/2015 12/23/2013 09/19/2013  PHQ - 2 Score 0 0 0 0     Cognitive Testing MMSE - Mini Mental State Exam 10/30/2015  Orientation to time 5  Orientation to Place 5  Registration 3  Attention/ Calculation 5  Recall 3  Language- name 2 objects 2  Language- repeat 1  Language- follow 3 step command 3  Language- read & follow direction 1  Write a sentence 1  Copy design 1  Total score 30    Immunization History  Administered Date(s) Administered  . Influenza,inj,Quad PF,36+ Mos 11/30/2012, 09/19/2013  . Td 09/19/2013   Screening Tests Health Maintenance  Topic Date Due  . ZOSTAVAX  05/12/1999  . PNA vac Low Risk Adult (1 of 2 - PCV13) 05/11/2004  . INFLUENZA VACCINE  04/23/2016 (Originally 08/25/2015)  . MAMMOGRAM  10/25/2016  . TETANUS/TDAP  09/20/2023  . DEXA SCAN  Completed      Plan:   End of life planning; Advance aging; Advanced directives discussed. No HCPOA/Living Will.   Educational material deferred.   Medicare Attestation I have personally reviewed: The patient's medical and social history Their use of alcohol, tobacco or illicit drugs Their current medications and supplements The patient's functional ability including ADLs,fall risks, home safety risks, cognitive, and hearing and visual impairment Diet and physical activities Evidence for depression   The patient's weight, height, BMI, and visual acuity have been recorded in the chart.  I have made referrals and provided education to the patient based on review of the above and I have provided the patient with a written personalized care plan for preventive services.    During the  course of the visit the patient was educated and counseled about the following appropriate screening and preventive services:   Vaccines to include Pneumoccal, Influenza, Hepatitis B, Td, Zostavax, HCV  Electrocardiogram  Cardiovascular Disease  Colorectal cancer screening  Bone density screening  Diabetes screening  Glaucoma screening  Mammography/PAP  Nutrition counseling   Patient Instructions (the written plan) was given to the patient.   OBrien-Blaney, Carrie Heeg Noble, Carrie Noble  16/1/096010/06/2015   Reviewed above information.  Agree with plan.  Dr Lorin PicketScott

## 2015-11-26 ENCOUNTER — Encounter: Payer: Self-pay | Admitting: Family Medicine

## 2015-11-26 ENCOUNTER — Ambulatory Visit (INDEPENDENT_AMBULATORY_CARE_PROVIDER_SITE_OTHER): Payer: Medicare Other | Admitting: Family Medicine

## 2015-11-26 VITALS — BP 144/86 | HR 73 | Temp 97.9°F | Resp 14 | Wt 139.4 lb

## 2015-11-26 DIAGNOSIS — J209 Acute bronchitis, unspecified: Secondary | ICD-10-CM | POA: Diagnosis not present

## 2015-11-26 MED ORDER — PREDNISONE 50 MG PO TABS: ORAL_TABLET | ORAL | 0 refills | Status: DC

## 2015-11-26 MED ORDER — HYDROCOD POLST-CPM POLST ER 10-8 MG/5ML PO SUER: 5.0000 mL | Freq: Two times a day (BID) | ORAL | 0 refills | Status: DC | PRN

## 2015-11-26 NOTE — Progress Notes (Signed)
Pre visit review using our clinic review tool, if applicable. No additional management support is needed unless otherwise documented below in the visit note. 

## 2015-11-26 NOTE — Patient Instructions (Signed)
Take the prednisone and use the cough syrup as directed.  Follow up as needed.  Take care  Dr. Adriana Simasook

## 2015-11-26 NOTE — Progress Notes (Signed)
Subjective:  Patient ID: Carrie Noble, female    DOB: 10-Jul-1940  Age: 75 y.o. MRN: [id number]  CC: Cough, congestion  HPI:  75 year old female presents with the above complaints.  Patient reports a two-week history of cough and congestion. Moderate in severity. No associated fever. No shortness of breath. She's been taking Mucinex with some improvement but no resolution. She has had a sick contact at home, as her husband has been sick as well. No known exacerbating factors. No other complaints or issues at this time.  Social Hx   Social History   Social History  . Marital status: Married    Spouse name: N/A  . Number of children: N/A  . Years of education: N/A   Occupational History  . Retired Other   Social History Main Topics  . Smoking status: Never Smoker  . Smokeless tobacco: Never Used  . Alcohol use No  . Drug use: No  . Sexual activity: No   Other Topics Concern  . None   Social History Narrative   Occasionally drinks coffee.    Review of Systems  Constitutional: Negative.   HENT: Positive for congestion.   Respiratory: Positive for cough.    Objective:  BP (!) 144/86 (BP Location: Right Arm, Patient Position: Sitting, Cuff Size: Normal)   Pulse 73   Temp 97.9 F (36.6 C) (Oral)   Resp 14   Wt 139 lb 6 oz (63.2 kg)   SpO2 96%   BMI 24.69 kg/m   BP/Weight 11/26/2015 10/30/2015 10/09/2015  Systolic BP 144 118 118  Diastolic BP 86 62 62  Wt. (Lbs) 139.38 138.8 139.6  BMI 24.69 24.59 24.73   Physical Exam  Constitutional: She is oriented to person, place, and time. She appears well-developed. No distress.  HENT:  Head: Normocephalic and atraumatic.  Mouth/Throat: Oropharynx is clear and moist.  Cardiovascular: Normal rate and regular rhythm.   Pulmonary/Chest: Effort normal. She has no wheezes. She has no rales.  Neurological: She is alert and oriented to person, place, and time.  Psychiatric: She has a normal mood and affect.  Vitals  reviewed.  Lab Results  Component Value Date   WBC 5.0 10/21/2015   HGB 13.8 10/21/2015   HCT 40.0 10/21/2015   PLT 367.0 10/21/2015   GLUCOSE 92 10/21/2015   CHOL 250 (H) 10/21/2015   TRIG 166.0 (H) 10/21/2015   HDL 51.10 10/21/2015   LDLDIRECT 152.2 11/07/2013   LDLCALC 165 (H) 10/21/2015   ALT 20 10/21/2015   AST 20 10/21/2015   NA 140 10/21/2015   K 4.0 10/21/2015   CL 103 10/21/2015   CREATININE 0.99 10/21/2015   BUN 15 10/21/2015   CO2 29 10/21/2015   TSH 2.48 10/21/2015   HGBA1C 5.7 03/29/2013    Assessment & Plan:   Problem List Items Addressed This Visit    Acute bronchitis - Primary    New acute problem. No indication for antibiotic. Treating with Tussionex and prednisone.      Relevant Medications   chlorpheniramine-HYDROcodone (TUSSIONEX PENNKINETIC ER) 10-8 MG/5ML SUER   predniSONE (DELTASONE) 50 MG tablet    Other Visit Diagnoses   None.     Meds ordered this encounter  Medications  . chlorpheniramine-HYDROcodone (TUSSIONEX PENNKINETIC ER) 10-8 MG/5ML SUER    Sig: Take 5 mLs by mouth every 12 (twelve) hours as needed.    Dispense:  115 mL    Refill:  0  . predniSONE (DELTASONE) 50 MG tablet  Sig: 1 tablet daily x 5 days.    Dispense:  5 tablet    Refill:  0    Follow-up: PRN  Everlene OtherJayce Vinny Taranto DO [facility] [place]

## 2015-11-26 NOTE — Assessment & Plan Note (Signed)
New acute problem. No indication for antibiotic. Treating with Tussionex and prednisone.

## 2016-02-15 ENCOUNTER — Telehealth: Payer: Self-pay | Admitting: Internal Medicine

## 2016-02-15 NOTE — Telephone Encounter (Signed)
Patient aware and will make Dr. Dorcas Mcmurrayingledein  Aware.

## 2016-02-15 NOTE — Telephone Encounter (Signed)
I do not mind seeing her tomorrow, but I do want her to let Dr Dorcas Mcmurrayingledein know if increased pain with drops and of persistent dryness to make sure nothing more that opthalmology needs to do.  Thanks

## 2016-02-15 NOTE — Telephone Encounter (Signed)
Patient hurting around eyes and in cheeks, has cough, Patient stated that Dr. Adele Schilderingledine  Place patient on medication for dry eyes, Benay Spice(Xiidra) patient has only tried twice but each time patient has had the same symptoms, pain around eyes and pain that radiates to both ears, patient stated Dr. Adele Schilderingledine had told patient not to use after the first episode, patient eyes were so dry theat she tried the second time this weekend and now the headaches and pain around eyes are back, patient placed a warm compress over eyes and sinus area and took Tylenol 500 mg with 1 81 mg aspirin and stated feels better but is afraid the pain will come back. Ask patient why she did not call Dr. Adele Schilderingledine she stated that he is doing nothing for her dry eyes and she believes she has autoimmune problem. Also GI has advised patient she has to nodules in her throat and is recommending sleep study? Patient also wants to discuss with PCP.

## 2016-02-16 ENCOUNTER — Encounter: Payer: Self-pay | Admitting: Internal Medicine

## 2016-02-16 ENCOUNTER — Ambulatory Visit (INDEPENDENT_AMBULATORY_CARE_PROVIDER_SITE_OTHER): Payer: Medicare Other | Admitting: Internal Medicine

## 2016-02-16 DIAGNOSIS — R519 Headache, unspecified: Secondary | ICD-10-CM | POA: Insufficient documentation

## 2016-02-16 DIAGNOSIS — K219 Gastro-esophageal reflux disease without esophagitis: Secondary | ICD-10-CM | POA: Diagnosis not present

## 2016-02-16 DIAGNOSIS — H04123 Dry eye syndrome of bilateral lacrimal glands: Secondary | ICD-10-CM | POA: Diagnosis not present

## 2016-02-16 DIAGNOSIS — R51 Headache: Secondary | ICD-10-CM

## 2016-02-16 LAB — CBC WITH DIFFERENTIAL/PLATELET
BASOS ABS: 0 10*3/uL (ref 0.0–0.1)
BASOS PCT: 0.3 % (ref 0.0–3.0)
Eosinophils Absolute: 0 10*3/uL (ref 0.0–0.7)
Eosinophils Relative: 0.9 % (ref 0.0–5.0)
HCT: 39.4 % (ref 36.0–46.0)
Hemoglobin: 13.5 g/dL (ref 12.0–15.0)
LYMPHS ABS: 1 10*3/uL (ref 0.7–4.0)
Lymphocytes Relative: 18.3 % (ref 12.0–46.0)
MCHC: 34.3 g/dL (ref 30.0–36.0)
MCV: 93.6 fl (ref 78.0–100.0)
MONOS PCT: 12.7 % — AB (ref 3.0–12.0)
Monocytes Absolute: 0.7 10*3/uL (ref 0.1–1.0)
NEUTROS ABS: 3.6 10*3/uL (ref 1.4–7.7)
NEUTROS PCT: 67.8 % (ref 43.0–77.0)
PLATELETS: 298 10*3/uL (ref 150.0–400.0)
RBC: 4.21 Mil/uL (ref 3.87–5.11)
RDW: 14.2 % (ref 11.5–15.5)
WBC: 5.3 10*3/uL (ref 4.0–10.5)

## 2016-02-16 LAB — SEDIMENTATION RATE: Sed Rate: 21 mm/hr (ref 0–30)

## 2016-02-16 LAB — BASIC METABOLIC PANEL
BUN: 12 mg/dL (ref 6–23)
CALCIUM: 9.3 mg/dL (ref 8.4–10.5)
CO2: 29 mEq/L (ref 19–32)
CREATININE: 0.95 mg/dL (ref 0.40–1.20)
Chloride: 100 mEq/L (ref 96–112)
GFR: 60.66 mL/min (ref >60.00)
GLUCOSE: 90 mg/dL (ref 70–99)
Potassium: 4.2 mEq/L (ref 3.5–5.1)
SODIUM: 136 meq/L (ref 135–145)

## 2016-02-16 LAB — C-REACTIVE PROTEIN: CRP: 2.2 mg/dL (ref 0.5–20.0)

## 2016-02-16 MED ORDER — PREDNISONE 10 MG PO TABS: ORAL_TABLET | ORAL | 0 refills | Status: DC

## 2016-02-16 NOTE — Progress Notes (Signed)
Patient ID: Carrie Noble, female   DOB: 1940/02/12, 76 y.o.   MRN: [id number]   Subjective:    Patient ID: Carrie Noble, female    DOB: Jan 19, 1941, 76 y.o.   MRN: [id number]  HPI  Patient here as a work in with concerns regarding dry eyes and pain.  States has had problems with burning in her eyes for a while.  Saw opthalmology.  Was given drops.  Did not tolerate.  Retried the drops last week.  Increased pain in her eyes when she used.  She reports increased pain and pressure around and behind her eyes. Some head pain.  Has some congestion.  Some chest congestion.  Decreased appetite.  Some nausea.  No vomiting.  Using compresses.  Helps.  Feels better today.  No fever.  No neck stiffness.  No chest pain.  No sob.  No acid reflux.  No abdominal pain or cramping.  Took excedrin last week.     Past Medical History:  Diagnosis Date  . Allergy   . Arthritis   . Gastroesophageal reflux   . Hiatal hernia 1989   status post Nissen fundoplication   . Hx of hysterectomy   . Hyperlipidemia   . Tachycardia    Patient stated that this has been occuring frequently.  . Thyroid disease    Goiter   Past Surgical History:  Procedure Laterality Date  . ABDOMINAL HYSTERECTOMY  1980   partial  . BREAST BIOPSY Right    neg  . BREAST EXCISIONAL BIOPSY Left 2014   neg  . COLONOSCOPY  2015  . ESOPHAGOGASTRIC FUNDOPLICATION  8938  . LAPAROSCOPIC NISSEN FUNDOPLICATION  1017  . TONSILLECTOMY     as well as goiter  . UPPER GI ENDOSCOPY  2015   Family History  Problem Relation Age of Onset  . Stroke Mother 9  . Arthritis Mother   . Heart disease Mother   . Hypertension Mother   . Stroke Father 45  . Hypertension Father   . Rheumatic fever Brother     and multiple open heart surgeries  . Diabetes Brother     type 2  . Stroke Brother   . Other Sister     Coronary Atherosclerosis  . Stroke Brother   . Stroke Sister   . Heart disease Sister   . Dementia Sister   . Cancer Sister    ovarian   Social History   Social History  . Marital status: Married    Spouse name: N/A  . Number of children: N/A  . Years of education: N/A   Occupational History  . Retired Other   Social History Main Topics  . Smoking status: Never Smoker  . Smokeless tobacco: Never Used  . Alcohol use No  . Drug use: No  . Sexual activity: No   Other Topics Concern  . None   Social History Narrative   Occasionally drinks coffee.    Outpatient Encounter Prescriptions as of 02/16/2016  Medication Sig  . mometasone (NASONEX) 50 MCG/ACT nasal spray 1 SPRAY 1-2 TIMES PER DAY AS NEEDED.  Marland Kitchen neomycin-polymyxin-dexamethasone (MAXITROL) 0.1 % ophthalmic suspension Place 1 drop into both eyes 2 (two) times daily.  . Olopatadine HCl (PAZEO) 0.7 % SOLN Apply 1 drop to eye daily.  Marland Kitchen omeprazole (PRILOSEC) 20 MG capsule Take 1 capsule (20 mg total) by mouth 2 (two) times daily. MUST KEEP APPT WITH DR. Nicki Reaper FOR FURTHER REFILLS  . PROAIR HFA 108 (90 Base) MCG/ACT inhaler  INHALE 2 PUFFS INTO THE LUNGS EVERY 4 TO 6 HOURS AS NEEDED FOR COUGHING/WHEEZING  . Biotin 1000 MCG tablet Take 1,000 mcg by mouth 3 (three) times daily. Reported on 05/13/2015  . Calcium Carbonate (CALCIUM 600 PO) Take by mouth daily. Reported on 05/13/2015  . chlorpheniramine-HYDROcodone (TUSSIONEX PENNKINETIC ER) 10-8 MG/5ML SUER Take 5 mLs by mouth every 12 (twelve) hours as needed. (Patient not taking: Reported on 02/16/2016)  . Cholecalciferol (VITAMIN D PO) Take by mouth daily. Reported on 05/13/2015  . dexlansoprazole (DEXILANT) 60 MG capsule Take 60 mg by mouth daily. Reported on 05/13/2015  . montelukast (SINGULAIR) 10 MG tablet Take 1 tablet (10 mg total) by mouth at bedtime. (Patient not taking: Reported on 02/16/2016)  . predniSONE (DELTASONE) 10 MG tablet Take 6 tablets x 1 day and then decrease by 1 tablet per day until down to zero mg.  . ranitidine (ZANTAC) 150 MG tablet Take 150 mg by mouth at bedtime. Reported on 05/13/2015    . tiZANidine (ZANAFLEX) 4 MG tablet Take 1 tablet (4 mg total) by mouth at bedtime as needed for muscle spasms. (Patient not taking: Reported on 02/16/2016)  . traMADol (ULTRAM) 50 MG tablet Take 50 mg by mouth 2 (two) times daily as needed. Reported on 05/13/2015  . vitamin B-12 (CYANOCOBALAMIN) 1000 MCG tablet Take 1,000 mcg by mouth daily. Reported on 05/13/2015  . [DISCONTINUED] predniSONE (DELTASONE) 50 MG tablet 1 tablet daily x 5 days. (Patient not taking: Reported on 02/16/2016)   No facility-administered encounter medications on file as of 02/16/2016.     Review of Systems  Constitutional: Negative for unexpected weight change.       Some decreased appetite.    HENT: Positive for congestion and postnasal drip.   Respiratory: Positive for cough. Negative for chest tightness and shortness of breath.   Cardiovascular: Negative for chest pain, palpitations and leg swelling.  Gastrointestinal: Positive for nausea. Negative for abdominal pain, diarrhea and vomiting.  Genitourinary: Negative for difficulty urinating and dysuria.  Musculoskeletal: Negative for back pain and joint swelling.  Skin: Negative for color change and rash.  Neurological: Positive for headaches. Negative for dizziness and light-headedness.  Psychiatric/Behavioral: Negative for agitation and dysphoric mood.       Objective:    Physical Exam  Constitutional: She appears well-developed and well-nourished. No distress.  HENT:  Nose: Nose normal.  Mouth/Throat: Oropharynx is clear and moist.  Neck: Neck supple. No thyromegaly present.  Cardiovascular: Normal rate and regular rhythm.   Pulmonary/Chest: Breath sounds normal. No respiratory distress. She has no wheezes.  Abdominal: Soft. Bowel sounds are normal. There is no tenderness.  Musculoskeletal: She exhibits no edema or tenderness.  Lymphadenopathy:    She has no cervical adenopathy.  Skin: No rash noted. No erythema.  Psychiatric: She has a normal mood  and affect. Her behavior is normal.    BP 116/64   Pulse 91   Temp 98.3 F (36.8 C) (Oral)   Resp 12   Ht 5' 3" (1.6 m)   Wt 139 lb 12 oz (63.4 kg)   SpO2 95%   BMI 24.76 kg/m  Wt Readings from Last 3 Encounters:  02/16/16 139 lb 12 oz (63.4 kg)  11/26/15 139 lb 6 oz (63.2 kg)  10/30/15 138 lb 12.8 oz (63 kg)     Lab Results  Component Value Date   WBC 5.3 02/16/2016   HGB 13.5 02/16/2016   HCT 39.4 02/16/2016   PLT 298.0 02/16/2016  GLUCOSE 90 02/16/2016   CHOL 250 (H) 10/21/2015   TRIG 166.0 (H) 10/21/2015   HDL 51.10 10/21/2015   LDLDIRECT 152.2 11/07/2013   LDLCALC 165 (H) 10/21/2015   ALT 20 10/21/2015   AST 20 10/21/2015   NA 136 02/16/2016   K 4.2 02/16/2016   CL 100 02/16/2016   CREATININE 0.95 02/16/2016   BUN 12 02/16/2016   CO2 29 02/16/2016   TSH 2.48 10/21/2015   HGBA1C 5.7 03/29/2013    US Breast Ltd Uni Right Inc Axilla  Result Date: 10/26/2015 CLINICAL DATA:  76 year old female with intermittent lateral left breast pain EXAM: 2D DIGITAL DIAGNOSTIC BILATERAL MAMMOGRAM WITH CAD AND ADJUNCT TOMO ULTRASOUND RIGHT BREAST COMPARISON:  Previous exam(s). ACR Breast Density Category c: The breast tissue is heterogeneously dense, which may obscure small masses. FINDINGS: No suspicious mass, calcifications, or other abnormality is identified within either breast. The patient has had prior benign excisional and needle biopsies of the left breast. Mammographic images were processed with CAD. On physical exam, no discrete mass is felt in the area of concern in the lateral right breast. Targeted ultrasound is performed, showing no suspicious cystic or solid sonographic finding in the area of concern in the lateral right breast. IMPRESSION: No mammographic or sonographic evidence of malignancy. RECOMMENDATION: Screening mammogram in one year.(Code:SM-B-01Y) I have discussed the findings and recommendations with the patient. Results were also provided in writing at the  conclusion of the visit. If applicable, a reminder letter will be sent to the patient regarding the next appointment. BI-RADS CATEGORY  1: Negative. Electronically Signed   By: Pamelia Hoit M.D.   On: 10/26/2015 14:53   Mm Diag Breast Tomo Bilateral  Result Date: 10/26/2015 CLINICAL DATA:  76 year old female with intermittent lateral left breast pain EXAM: 2D DIGITAL DIAGNOSTIC BILATERAL MAMMOGRAM WITH CAD AND ADJUNCT TOMO ULTRASOUND RIGHT BREAST COMPARISON:  Previous exam(s). ACR Breast Density Category c: The breast tissue is heterogeneously dense, which may obscure small masses. FINDINGS: No suspicious mass, calcifications, or other abnormality is identified within either breast. The patient has had prior benign excisional and needle biopsies of the left breast. Mammographic images were processed with CAD. On physical exam, no discrete mass is felt in the area of concern in the lateral right breast. Targeted ultrasound is performed, showing no suspicious cystic or solid sonographic finding in the area of concern in the lateral right breast. IMPRESSION: No mammographic or sonographic evidence of malignancy. RECOMMENDATION: Screening mammogram in one year.(Code:SM-B-01Y) I have discussed the findings and recommendations with the patient. Results were also provided in writing at the conclusion of the visit. If applicable, a reminder letter will be sent to the patient regarding the next appointment. BI-RADS CATEGORY  1: Negative. Electronically Signed   By: Pamelia Hoit M.D.   On: 10/26/2015 14:53       Assessment & Plan:   Problem List Items Addressed This Visit    Dry eyes    F/u with opthalmology as outlined.        Relevant Orders   Rheumatoid factor (Completed)   Sedimentation rate (Completed)   ANA (Completed)   C-reactive protein (Completed)   GERD (gastroesophageal reflux disease)    Controlled on current medication regimen.  Follow.        Headache    Symptoms and exam as outlined.  Feel  her eyes are contributing.  Encouraged f/u with opthalmology.  Has stopped the drops.  Some congestion.  Saline and nasal spray as outlined.  Discussed possible scanning.  Check esr and cbc.  Wants to hold on scanning until labs return.  Prednisone taper for persistent congestion and sinus issues and see if helps with headache.  Follow.        Relevant Orders   CBC with Differential/Platelet (Completed)   Basic metabolic panel (Completed)       Einar Pheasant, MD

## 2016-02-16 NOTE — Progress Notes (Signed)
Pre-visit discussion using our clinic review tool. No additional management support is needed unless otherwise documented below in the visit note.  

## 2016-02-17 LAB — RHEUMATOID FACTOR: Rhuematoid fact SerPl-aCnc: <14 IU/mL (ref <14)

## 2016-02-18 LAB — ANA: ANA: NEGATIVE

## 2016-02-21 ENCOUNTER — Encounter: Payer: Self-pay | Admitting: Internal Medicine

## 2016-02-21 NOTE — Assessment & Plan Note (Signed)
F/u with opthalmology as outlined.

## 2016-02-21 NOTE — Assessment & Plan Note (Signed)
Controlled on current medication regimen.  Follow.   

## 2016-02-21 NOTE — Assessment & Plan Note (Signed)
Symptoms and exam as outlined.  Feel her eyes are contributing.  Encouraged f/u with opthalmology.  Has stopped the drops.  Some congestion.  Saline and nasal spray as outlined.  Discussed possible scanning.  Check esr and cbc.  Wants to hold on scanning until labs return.  Prednisone taper for persistent congestion and sinus issues and see if helps with headache.  Follow.

## 2016-02-23 ENCOUNTER — Telehealth: Payer: Self-pay | Admitting: Internal Medicine

## 2016-02-23 ENCOUNTER — Other Ambulatory Visit: Payer: Self-pay | Admitting: Internal Medicine

## 2016-02-23 DIAGNOSIS — K219 Gastro-esophageal reflux disease without esophagitis: Secondary | ICD-10-CM

## 2016-02-23 NOTE — Telephone Encounter (Signed)
Pt called returning your call. Thank you!  Call pt @ [phone].

## 2016-02-23 NOTE — Progress Notes (Signed)
Order placed for GI referral.   

## 2016-02-25 NOTE — Telephone Encounter (Signed)
left message GI referral in process,

## 2016-05-03 ENCOUNTER — Telehealth: Payer: Self-pay | Admitting: Internal Medicine

## 2016-05-03 NOTE — Telephone Encounter (Signed)
Please advise 

## 2016-05-03 NOTE — Telephone Encounter (Signed)
Pt called and is requesting a refill on her omeprazole (PRILOSEC) 20 MG capsule. Pt would like to know if Dr, Lorin Picket could write the rx for 10 mg instead of 20 mg she is doing ok with a total of 20 mg. Please advise, thank you!   Pharmacy - [facility] - [place], [place] - [address]  Call pt @ [phone] (may leave msg)

## 2016-05-04 MED ORDER — OMEPRAZOLE 10 MG PO CPDR: 10.0000 mg | DELAYED_RELEASE_CAPSULE | Freq: Every day | ORAL | 2 refills | Status: DC

## 2016-05-04 MED ORDER — OMEPRAZOLE 10 MG PO CPDR: 10.0000 mg | DELAYED_RELEASE_CAPSULE | Freq: Two times a day (BID) | ORAL | 2 refills | Status: DC

## 2016-05-04 MED ORDER — OMEPRAZOLE 20 MG PO CPDR: 20.0000 mg | DELAYED_RELEASE_CAPSULE | Freq: Every day | ORAL | 2 refills | Status: DC

## 2016-05-04 NOTE — Telephone Encounter (Signed)
rx sent in for omeprazole  #30 with 2 refills.

## 2016-05-04 NOTE — Telephone Encounter (Signed)
Called pharmacy they said that a message was sent to our office wanted to know if we can change script to one  qd so insurance will pay?

## 2016-05-04 NOTE — Telephone Encounter (Signed)
Patient has request to have a new Rx sent to the pharmacy for omeprazole . Patient received a  Rx.  Pt contact [phone]

## 2016-05-04 NOTE — Telephone Encounter (Signed)
rx sent in for  bid.  (I accidentally sent in rx for  q day and then sent in for bid).  Please call and notify that bid is the correct rx.  Thanks

## 2016-05-05 NOTE — Telephone Encounter (Signed)
Submitted PA on Cover my meds, awaiting response. thanks

## 2016-05-05 NOTE — Telephone Encounter (Signed)
Dr. Lorin Picket, if you can give me the okay for this, I will start the PA process.  Patient wants to wean off the med so wants  tablets to take 1 to 2 times a day and insurance needs the PA for this.  Thanks

## 2016-05-05 NOTE — Telephone Encounter (Signed)
SPoke with the patient and explained that the insurance requested the  tablets and that is why we sent them.  She is going to try and call them to see if they will cover  tablets and if so she will call us back to change the rx. thanks

## 2016-05-05 NOTE — Telephone Encounter (Signed)
PT would like it sent to [id number], [place] - [address]

## 2016-05-05 NOTE — Telephone Encounter (Signed)
ok 

## 2016-05-05 NOTE — Telephone Encounter (Signed)
Spoke with PA rep. Vernona Rieger, started process, received key code for cover my meds. Process started on there. thanks

## 2016-05-05 NOTE — Telephone Encounter (Signed)
Megan from [facility] called and stated that we will start a prior auth. Please advise, thank you!  They need the prior auth for 10 mg 2 twice day for #60 with 2 refills, so that she can ween herself off the medication.  Call Prior Auth - [phone] Fax - [phone]

## 2016-05-09 NOTE — Telephone Encounter (Signed)
Please call pt at [phone]

## 2016-05-09 NOTE — Telephone Encounter (Signed)
Denial received have put in your green folder for review please advise

## 2016-05-09 NOTE — Telephone Encounter (Signed)
Notified the patient and she will call the insurance to see why they denied it as she had understood that they would do everything to get it approved.  thanks

## 2016-05-09 NOTE — Telephone Encounter (Signed)
Please notify pt that medication  was denied.

## 2016-05-10 NOTE — Telephone Encounter (Signed)
Left a message with for her to return my call, thanks

## 2016-05-17 NOTE — Telephone Encounter (Signed)
Left a second message to call me back. thanks

## 2016-05-19 NOTE — Telephone Encounter (Signed)
Note closed as patient has not returned my call, thanks

## 2016-07-29 ENCOUNTER — Ambulatory Visit: Payer: Medicare Other | Attending: Otolaryngology

## 2016-07-29 DIAGNOSIS — F5101 Primary insomnia: Secondary | ICD-10-CM | POA: Insufficient documentation

## 2016-07-29 DIAGNOSIS — G4730 Sleep apnea, unspecified: Secondary | ICD-10-CM | POA: Diagnosis present

## 2016-07-29 DIAGNOSIS — R0683 Snoring: Secondary | ICD-10-CM | POA: Insufficient documentation

## 2016-08-12 ENCOUNTER — Other Ambulatory Visit: Payer: Self-pay | Admitting: Internal Medicine

## 2016-08-12 ENCOUNTER — Telehealth: Payer: Self-pay | Admitting: Internal Medicine

## 2016-08-12 ENCOUNTER — Telehealth: Payer: Self-pay | Admitting: *Deleted

## 2016-08-12 NOTE — Telephone Encounter (Signed)
Patient Name: Carrie LarkLINDA Croghan  DOB: 1940/12/01    Initial Comment Caller states she's having chest burning- uncomfortable in her chest.   Nurse Assessment  Nurse: Bing PlumeHaynes, RN, Erin Date/Time (Eastern Time): 08/12/2016 11:24:03 AM  Confirm and document reason for call. If symptomatic, describe symptoms. ---caller states she is having burning in her chest like indigestion. Has had surgery for it in the past and her MD was [facility]. Also c/o urinary frequency and burning with urination.  Does the patient have any new or worsening symptoms? ---Yes  Will a triage be completed? ---Yes  Related visit to physician within the last 2 weeks? ---No  Does the PT have any chronic conditions? (i.e. diabetes, asthma, etc.) ---Yes  List chronic conditions. ---GERD HH repair Nessian Fundoplication  Is this a behavioral health or substance abuse call? ---No     Guidelines    Guideline Title Affirmed Question Affirmed Notes  Abdominal Pain - Upper [1] MILD-MODERATE pain AND [2] constant AND [3] present > 2 hours    Final Disposition User   See Physician within 4 Hours (or PCP triage) Bing PlumeHaynes, RN, Denny PeonErin    Comments  taking 40 mg of omeprazole  was feeling lightheaded 110/60 standing. 99/60 lying down  was taking OTC allergy medication  soreness in my breast  c/o mild pain  states has been having dark BM

## 2016-08-12 NOTE — Telephone Encounter (Signed)
Tried to reach patient  By phone no answer left message to please return cal to  Office.

## 2016-08-12 NOTE — Telephone Encounter (Signed)
Carrie Noble spoke with patient and advised [facility] to [facility] .  [facility] documentation.

## 2016-08-12 NOTE — Telephone Encounter (Signed)
Pt feels as if she has a Uti, she has burning in chest and stomach from acid reflux possibly.  Pt was transferred to the nurse line, for chest being uncomfortable.  Please give a time and date on Dr.Scott schedule  Pt contact [phone]

## 2016-08-12 NOTE — Telephone Encounter (Signed)
Agree with evaluation if light headed and burning in her chest.

## 2016-08-12 NOTE — Telephone Encounter (Signed)
Patient stated she has another appointment today at 1 and if she is no better after that appointment she will [facility]

## 2016-08-19 ENCOUNTER — Telehealth: Payer: Self-pay | Admitting: Internal Medicine

## 2016-08-19 NOTE — Telephone Encounter (Signed)
Pt requesting a refill on her omeprazole (PRILOSEC) 20 MG capsule. She stated that pharmacy does not have this. Please advise, thank you!  [id number] [place] - [address]

## 2016-08-19 NOTE — Telephone Encounter (Signed)
Just wanted to let you know I have called patient She is currently being treated by GI. She has had some changes in medications for reflux and does not feel like it helpful. Informed patient she needs to call GI and get advice from them she is ok with that. Will call office if any problems she will call our office back.

## 2016-08-23 ENCOUNTER — Ambulatory Visit (INDEPENDENT_AMBULATORY_CARE_PROVIDER_SITE_OTHER): Payer: Medicare Other

## 2016-08-23 ENCOUNTER — Ambulatory Visit (INDEPENDENT_AMBULATORY_CARE_PROVIDER_SITE_OTHER): Payer: Medicare Other | Admitting: Podiatry

## 2016-08-23 ENCOUNTER — Encounter: Payer: Self-pay | Admitting: Podiatry

## 2016-08-23 DIAGNOSIS — M722 Plantar fascial fibromatosis: Secondary | ICD-10-CM

## 2016-08-23 MED ORDER — BETAMETHASONE SOD PHOS & ACET 6 (3-3) MG/ML IJ SUSP: 3.0000 mg | Freq: Once | INTRAMUSCULAR | Status: AC

## 2016-08-23 NOTE — Progress Notes (Signed)
   Subjective: Established patient presents today with a new complaint of heel pain to the right lower extremity is been going on for the past several weeks. Patient states that the right is worse than the left. Patient takes tramadol for the arthritis which helps a little with the foot pain. Patient denies trauma. Patient was last seen for evaluation of a left ankle fracture and currently has no issues regarding her left lower extremity.  Objective: Physical Exam General: The patient is alert and oriented x3 in no acute distress.  Dermatology: Skin is warm, dry and supple bilateral lower extremities. Negative for open lesions or macerations bilateral.   Vascular: Dorsalis Pedis and Posterior Tibial pulses palpable bilateral.  Capillary fill time is immediate to all digits.  Neurological: Epicritic and protective threshold intact bilateral.   Musculoskeletal: Tenderness to palpation at the medial calcaneal tubercale and through the insertion of the plantar fascia of the right foot. All other joints range of motion within normal limits bilateral. Strength 5/5 in all groups bilateral.   Radiographic exam: Normal osseous mineralization. Joint spaces preserved. No fracture/dislocation/boney destruction. Calcaneal spur present with mild thickening of plantar fascia right. No other soft tissue abnormalities or radiopaque foreign bodies.   Assessment: 1. Plantar fasciitis right 2. Pain in right foot  Plan of Care:  1. Patient evaluated. Xrays reviewed.   2. Injection of 0.5cc Celestone soluspan injected into the right plantar fascia  3. Plantar fascial brace dispensed 4. The patient cannot tolerate oral NSAIDs as she has chronic acid reflux 5. Patient also has been side effects regarding oral prednisone 6. Return to clinic in 4 weeks  Felecia ShellingBrent M. Evans, DPM [facility]  Dr. Felecia ShellingBrent M. Evans, DPM    [address]                                        [place], [place] [address]                 Office [phone]  Fax [phone]

## 2016-09-01 ENCOUNTER — Ambulatory Visit (INDEPENDENT_AMBULATORY_CARE_PROVIDER_SITE_OTHER): Payer: Medicare Other | Admitting: Internal Medicine

## 2016-09-01 ENCOUNTER — Encounter: Payer: Self-pay | Admitting: Internal Medicine

## 2016-09-01 VITALS — BP 116/62 | HR 68 | Temp 98.3°F | Resp 12 | Ht 63.0 in | Wt 141.8 lb

## 2016-09-01 DIAGNOSIS — Z0000 Encounter for general adult medical examination without abnormal findings: Secondary | ICD-10-CM

## 2016-09-01 DIAGNOSIS — Z1231 Encounter for screening mammogram for malignant neoplasm of breast: Secondary | ICD-10-CM

## 2016-09-01 DIAGNOSIS — N3941 Urge incontinence: Secondary | ICD-10-CM | POA: Diagnosis not present

## 2016-09-01 DIAGNOSIS — F439 Reaction to severe stress, unspecified: Secondary | ICD-10-CM | POA: Diagnosis not present

## 2016-09-01 DIAGNOSIS — E049 Nontoxic goiter, unspecified: Secondary | ICD-10-CM | POA: Diagnosis not present

## 2016-09-01 DIAGNOSIS — E785 Hyperlipidemia, unspecified: Secondary | ICD-10-CM

## 2016-09-01 DIAGNOSIS — K219 Gastro-esophageal reflux disease without esophagitis: Secondary | ICD-10-CM

## 2016-09-01 DIAGNOSIS — Z1239 Encounter for other screening for malignant neoplasm of breast: Secondary | ICD-10-CM

## 2016-09-01 DIAGNOSIS — K2270 Barrett's esophagus without dysplasia: Secondary | ICD-10-CM | POA: Diagnosis not present

## 2016-09-01 DIAGNOSIS — M810 Age-related osteoporosis without current pathological fracture: Secondary | ICD-10-CM

## 2016-09-01 LAB — BASIC METABOLIC PANEL
BUN: 15 mg/dL (ref 6–23)
CALCIUM: 9.9 mg/dL (ref 8.4–10.5)
CO2: 29 meq/L (ref 19–32)
Chloride: 102 mEq/L (ref 96–112)
Creatinine, Ser: 0.96 mg/dL (ref 0.40–1.20)
GFR: 59.85 mL/min — AB (ref >60.00)
Glucose, Bld: 96 mg/dL (ref 70–99)
Potassium: 4.5 mEq/L (ref 3.5–5.1)
Sodium: 138 mEq/L (ref 135–145)

## 2016-09-01 LAB — HEPATIC FUNCTION PANEL
ALT: 21 U/L (ref 0–35)
AST: 21 U/L (ref 0–37)
Albumin: 4.6 g/dL (ref 3.5–5.2)
Alkaline Phosphatase: 114 U/L (ref 39–117)
BILIRUBIN DIRECT: 0.1 mg/dL (ref 0.0–0.3)
BILIRUBIN TOTAL: 0.6 mg/dL (ref 0.2–1.2)
Total Protein: 7.6 g/dL (ref 6.0–8.3)

## 2016-09-01 LAB — LIPID PANEL
CHOL/HDL RATIO: 5
Cholesterol: 242 mg/dL — ABNORMAL HIGH (ref 0–200)
HDL: 53.6 mg/dL (ref >39.00)
NONHDL: 187.96
TRIGLYCERIDES: 204 mg/dL — AB (ref 0.0–149.0)
VLDL: 40.8 mg/dL — AB (ref 0.0–40.0)

## 2016-09-01 LAB — VITAMIN D 25 HYDROXY (VIT D DEFICIENCY, FRACTURES): VITD: 24.03 ng/mL — ABNORMAL LOW (ref 30.00–100.00)

## 2016-09-01 LAB — TSH: TSH: 1.86 u[IU]/mL (ref 0.35–4.50)

## 2016-09-01 LAB — LDL CHOLESTEROL, DIRECT: Direct LDL: 141 mg/dL

## 2016-09-01 NOTE — Progress Notes (Signed)
Patient ID: Carrie Noble, female   DOB: Jan 13, 1941, 76 y.o.   MRN: [id number]   Subjective:    Patient ID: Carrie Noble, female    DOB: Jan 13, 1941, 76 y.o.   MRN: [id number]  HPI  Patient here for her physical exam.  She reports increased stress with her husband's issues.  Discussed with her today.  Discussed counseling.  She does not feel she needs anything more at this time.  She recently started having problems with epigastric discomfort.  Phone reported some chest discomfort.  She saw GI.  Note reviewed.  Was started on protonix.  Made her sick.  Is back on omeprazole now.  Taking twice a day.  Symptoms resolved.  Denies any chest pain.  No sob.  Declined further cardiac w/up.  No abdominal pain.  Bowels moving.  Did see Dr Dalbert GarnetBeasley for urinary issues.  Diagnosed with urge incontinence.  Recommend vaginal estrogen, vesicare and pelvic floor exercise.  She is not using the vesicare.     Past Medical History:  Diagnosis Date  . Allergy   . Arthritis   . Gastroesophageal reflux   . Hiatal hernia 1989   status post Nissen fundoplication   . Hx of hysterectomy   . Hyperlipidemia   . Tachycardia    Patient stated that this has been occuring frequently.  . Thyroid disease    Goiter   Past Surgical History:  Procedure Laterality Date  . ABDOMINAL HYSTERECTOMY  1980   partial  . BREAST BIOPSY Right    neg  . BREAST EXCISIONAL BIOPSY Left 2014   neg  . COLONOSCOPY  2015  . ESOPHAGOGASTRIC FUNDOPLICATION  1999  . LAPAROSCOPIC NISSEN FUNDOPLICATION  1999  . TONSILLECTOMY     as well as goiter  . UPPER GI ENDOSCOPY  2015   Family History  Problem Relation Age of Onset  . Stroke Mother 684  . Arthritis Mother   . Heart disease Mother   . Hypertension Mother   . Stroke Father 3362  . Hypertension Father   . Rheumatic fever Brother        and multiple open heart surgeries  . Diabetes Brother        type 2  . Stroke Brother   . Other Sister        Coronary Atherosclerosis  .  Stroke Brother   . Stroke Sister   . Heart disease Sister   . Dementia Sister   . Cancer Sister        ovarian   Social History   Social History  . Marital status: Married    Spouse name: N/A  . Number of children: N/A  . Years of education: N/A   Occupational History  . Retired Other   Social History Main Topics  . Smoking status: Never Smoker  . Smokeless tobacco: Never Used  . Alcohol use No  . Drug use: No  . Sexual activity: No   Other Topics Concern  . None   Social History Narrative   Occasionally drinks coffee.    Outpatient Encounter Prescriptions as of 09/01/2016  Medication Sig  . Biotin 1000 MCG tablet Take 1,000 mcg by mouth 3 (three) times daily. Reported on 05/13/2015  . Calcium Carbonate (CALCIUM 600 PO) Take by mouth daily. Reported on 05/13/2015  . Cholecalciferol (VITAMIN D PO) Take by mouth daily. Reported on 05/13/2015  . mometasone (NASONEX) 50 MCG/ACT nasal spray 1 SPRAY 1-2 TIMES PER DAY AS NEEDED.  Marland Kitchen. neomycin-polymyxin-dexamethasone (  MAXITROL) 0.1 % ophthalmic suspension Place 1 drop into both eyes 2 (two) times daily.  . Olopatadine HCl (PAZEO) 0.7 % SOLN Apply 1 drop to eye daily.  Marland Kitchen omeprazole (PRILOSEC) 20 MG capsule TAKE 1 CAPSULE (20 MG TOTAL) BY MOUTH DAILY.  Marland Kitchen PROAIR HFA 108 (90 Base) MCG/ACT inhaler INHALE 2 PUFFS INTO THE LUNGS EVERY 4 TO 6 HOURS AS NEEDED FOR COUGHING/WHEEZING  . ranitidine (ZANTAC) 150 MG tablet Take 150 mg by mouth at bedtime. Reported on 05/13/2015  . tiZANidine (ZANAFLEX) 4 MG tablet Take 1 tablet (4 mg total) by mouth at bedtime as needed for muscle spasms.  . traMADol (ULTRAM) 50 MG tablet Take 50 mg by mouth 2 (two) times daily as needed. Reported on 05/13/2015  . vitamin B-12 (CYANOCOBALAMIN) 1000 MCG tablet Take 1,000 mcg by mouth daily. Reported on 05/13/2015  . [DISCONTINUED] chlorpheniramine-HYDROcodone (TUSSIONEX PENNKINETIC ER) 10-8 MG/5ML SUER Take 5 mLs by mouth every 12 (twelve) hours as needed.  .  [DISCONTINUED] dexlansoprazole (DEXILANT) 60 MG capsule Take 60 mg by mouth daily. Reported on 05/13/2015  . [DISCONTINUED] montelukast (SINGULAIR) 10 MG tablet Take 1 tablet (10 mg total) by mouth at bedtime. (Patient not taking: Reported on 02/16/2016)  . [DISCONTINUED] predniSONE (DELTASONE) 10 MG tablet Take 6 tablets x 1 day and then decrease by 1 tablet per day until down to zero mg.   Facility-Administered Encounter Medications as of 09/01/2016  Medication  . betamethasone acetate-betamethasone sodium phosphate (CELESTONE) injection 3 mg    Review of Systems  Constitutional: Negative for appetite change and unexpected weight change.  HENT: Negative for congestion and sinus pressure.   Eyes: Negative for pain and visual disturbance.  Respiratory: Negative for cough, chest tightness and shortness of breath.   Cardiovascular: Negative for chest pain, palpitations and leg swelling.  Gastrointestinal: Negative for abdominal pain, diarrhea, nausea and vomiting.       No epigastric burning or acid reflux now.   Genitourinary: Negative for difficulty urinating and dysuria.  Musculoskeletal: Negative for back pain and joint swelling.  Skin: Negative for color change and rash.  Neurological: Negative for dizziness, light-headedness and headaches.  Hematological: Negative for adenopathy. Does not bruise/bleed easily.  Psychiatric/Behavioral: Negative for dysphoric mood.       Increased stress as outlined.         Objective:    Physical Exam  Constitutional: She is oriented to person, place, and time. She appears well-developed and well-nourished. No distress.  HENT:  Nose: Nose normal.  Mouth/Throat: Oropharynx is clear and moist.  Eyes: Right eye exhibits no discharge. Left eye exhibits no discharge. No scleral icterus.  Neck: Neck supple. No thyromegaly present.  Cardiovascular: Normal rate and regular rhythm.   Pulmonary/Chest: Breath sounds normal. No accessory muscle usage. No  tachypnea. No respiratory distress. She has no decreased breath sounds. She has no wheezes. She has no rhonchi. Right breast exhibits no inverted nipple, no mass, no nipple discharge and no tenderness (no axillary adenopathy). Left breast exhibits no inverted nipple, no mass, no nipple discharge and no tenderness (no axilarry adenopathy).  Abdominal: Soft. Bowel sounds are normal. There is no tenderness.  Musculoskeletal: She exhibits no edema or tenderness.  Lymphadenopathy:    She has no cervical adenopathy.  Neurological: She is alert and oriented to person, place, and time.  Skin: Skin is warm. No rash noted. No erythema.  Psychiatric: She has a normal mood and affect. Her behavior is normal.    BP 116/62 (BP  Location: Left Arm, Patient Position: Sitting, Cuff Size: Normal)   Pulse 68   Temp 98.3 F (36.8 C) (Oral)   Resp 12   Ht 5\' 3"  (1.6 m)   Wt 141 lb 12.8 oz (64.3 kg)   SpO2 97%   BMI 25.12 kg/m  Wt Readings from Last 3 Encounters:  09/01/16 141 lb 12.8 oz (64.3 kg)  02/16/16 139 lb 12 oz (63.4 kg)  11/26/15 139 lb 6 oz (63.2 kg)     Lab Results  Component Value Date   WBC 5.3 02/16/2016   HGB 13.5 02/16/2016   HCT 39.4 02/16/2016   PLT 298.0 02/16/2016   GLUCOSE 96 09/01/2016   CHOL 242 (H) 09/01/2016   TRIG 204.0 (H) 09/01/2016   HDL 53.60 09/01/2016   LDLDIRECT 141.0 09/01/2016   LDLCALC 165 (H) 10/21/2015   ALT 21 09/01/2016   AST 21 09/01/2016   NA 138 09/01/2016   K 4.5 09/01/2016   CL 102 09/01/2016   CREATININE 0.96 09/01/2016   BUN 15 09/01/2016   CO2 29 09/01/2016   TSH 1.86 09/01/2016   HGBA1C 5.7 03/29/2013    US Breast Ltd Uni Right Inc Axilla  Result Date: 10/26/2015 CLINICAL DATA:  76 year old female with intermittent lateral left breast pain EXAM: 2D DIGITAL DIAGNOSTIC BILATERAL MAMMOGRAM WITH CAD AND ADJUNCT TOMO ULTRASOUND RIGHT BREAST COMPARISON:  Previous exam(s). ACR Breast Density Category c: The breast tissue is heterogeneously  dense, which may obscure small masses. FINDINGS: No suspicious mass, calcifications, or other abnormality is identified within either breast. The patient has had prior benign excisional and needle biopsies of the left breast. Mammographic images were processed with CAD. On physical exam, no discrete mass is felt in the area of concern in the lateral right breast. Targeted ultrasound is performed, showing no suspicious cystic or solid sonographic finding in the area of concern in the lateral right breast. IMPRESSION: No mammographic or sonographic evidence of malignancy. RECOMMENDATION: Screening mammogram in one year.(Code:SM-B-01Y) I have discussed the findings and recommendations with the patient. Results were also provided in writing at the conclusion of the visit. If applicable, a reminder letter will be sent to the patient regarding the next appointment. BI-RADS CATEGORY  1: Negative. Electronically Signed   By: Dalphine Handing M.D.   On: 10/26/2015 14:53   Mm Diag Breast Tomo Bilateral  Result Date: 10/26/2015 CLINICAL DATA:  76 year old female with intermittent lateral left breast pain EXAM: 2D DIGITAL DIAGNOSTIC BILATERAL MAMMOGRAM WITH CAD AND ADJUNCT TOMO ULTRASOUND RIGHT BREAST COMPARISON:  Previous exam(s). ACR Breast Density Category c: The breast tissue is heterogeneously dense, which may obscure small masses. FINDINGS: No suspicious mass, calcifications, or other abnormality is identified within either breast. The patient has had prior benign excisional and needle biopsies of the left breast. Mammographic images were processed with CAD. On physical exam, no discrete mass is felt in the area of concern in the lateral right breast. Targeted ultrasound is performed, showing no suspicious cystic or solid sonographic finding in the area of concern in the lateral right breast. IMPRESSION: No mammographic or sonographic evidence of malignancy. RECOMMENDATION: Screening mammogram in one year.(Code:SM-B-01Y) I  have discussed the findings and recommendations with the patient. Results were also provided in writing at the conclusion of the visit. If applicable, a reminder letter will be sent to the patient regarding the next appointment. BI-RADS CATEGORY  1: Negative. Electronically Signed   By: Dalphine Handing M.D.   On: 10/26/2015 14:53  Assessment & Plan:   Problem List Items Addressed This Visit    Barrett's esophagus    Seeing GI.  Planning for f/u EGD.  Symptoms controlled now.        GERD (gastroesophageal reflux disease)    Recent flare as outlined.  On omeprazole bid now.  Symptoms better/resolved.  Planning for EGD.        Health care maintenance    Physical today 09/01/16.  Mammogram 10/26/15 - Birads I.  Schedule f/u mammogram.  Colonoscopy 02/2013.  Recommended f/u in 10 years.        Hyperlipidemia    Low cholesterol diet and exercise.  Recheck lipid panel.        Relevant Orders   Hepatic function panel (Completed)   Lipid panel (Completed)   Basic metabolic panel (Completed)   Osteoporosis    Dietary calcium and weight bearing exercise.  Continue vitamin D supplements.  Follow vitamin D level.        Relevant Orders   VITAMIN D 25 Hydroxy (Vit-D Deficiency, Fractures) (Completed)   Stress    Increased stress as outlined.  Discussed with her today.  She desires no further intervention at this time.  Follow.        Thyroid goiter    Followed by Dr Renae Fickle.        Relevant Orders   TSH (Completed)   Urge incontinence    Saw Dr Dalbert Garnet.  Not taking vesicare.  Pelvic floor exercise.  Recommended vaginal estrogen.         Other Visit Diagnoses    Routine general medical examination at a health care facility    -  Primary   Screening for breast cancer       Relevant Orders   MM DIGITAL SCREENING BILATERAL       Dale [place], MD

## 2016-09-04 ENCOUNTER — Encounter: Payer: Self-pay | Admitting: Internal Medicine

## 2016-09-04 DIAGNOSIS — F439 Reaction to severe stress, unspecified: Secondary | ICD-10-CM | POA: Insufficient documentation

## 2016-09-04 DIAGNOSIS — N3941 Urge incontinence: Secondary | ICD-10-CM | POA: Insufficient documentation

## 2016-09-04 NOTE — Assessment & Plan Note (Signed)
Recent flare as outlined.  On omeprazole bid now.  Symptoms better/resolved.  Planning for EGD.

## 2016-09-04 NOTE — Assessment & Plan Note (Signed)
Seeing GI.  Planning for f/u EGD.  Symptoms controlled now.

## 2016-09-04 NOTE — Assessment & Plan Note (Signed)
Low cholesterol diet and exercise.  Recheck lipid panel.  

## 2016-09-04 NOTE — Assessment & Plan Note (Signed)
Followed by Dr Paul.  

## 2016-09-04 NOTE — Assessment & Plan Note (Signed)
Dietary calcium and weight bearing exercise.  Continue vitamin D supplements.  Follow vitamin D level.

## 2016-09-04 NOTE — Assessment & Plan Note (Signed)
Increased stress as outlined.  Discussed with her today.  She desires no further intervention at this time.  Follow.  

## 2016-09-04 NOTE — Assessment & Plan Note (Signed)
Physical today 09/01/16.  Mammogram 10/26/15 - Birads I.  Schedule f/u mammogram.  Colonoscopy 02/2013.  Recommended f/u in 10 years.

## 2016-09-04 NOTE — Assessment & Plan Note (Signed)
Saw Dr Dalbert GarnetBeasley.  Not taking vesicare.  Pelvic floor exercise.  Recommended vaginal estrogen.

## 2016-09-20 ENCOUNTER — Ambulatory Visit: Payer: Medicare Other | Admitting: Podiatry

## 2016-10-12 ENCOUNTER — Telehealth: Payer: Self-pay | Admitting: Internal Medicine

## 2016-10-12 DIAGNOSIS — R079 Chest pain, unspecified: Secondary | ICD-10-CM

## 2016-10-12 DIAGNOSIS — N644 Mastodynia: Secondary | ICD-10-CM

## 2016-10-12 DIAGNOSIS — K219 Gastro-esophageal reflux disease without esophagitis: Secondary | ICD-10-CM

## 2016-10-12 NOTE — Telephone Encounter (Signed)
Pt called and wanted to know if she should have a diagnostic mammogram instead of a screening as she has been having issues with burning in her breasts and has had a diagnostic previously. She is also wanting to know if Dr. Lorin Picket could refer her to another gastroenterologist other than [facility] clinic for the indigestion she has been having. Please advise, thank you!  Call pt @ [phone]

## 2016-10-12 NOTE — Telephone Encounter (Signed)
Called [facility] they if pain/burning is both breast they will not do a diagnostic. Only screening unless patient can pin point a direct spot and she is not able to do that. I have removed order for diagnostic. They did make note in the  Appointment that she was having pain in both breast. Patient informed no questions   She was informed that we will call with GI app and did not have any questions.   She states that if you feel it is necessary she will go for Cardiac workup. But she feels like it is gi related. If you want to send her to cardiology she would like to go to what every doctor you recommend.

## 2016-10-12 NOTE — Telephone Encounter (Signed)
Just need to clarify a few things.  Per last visit, she had had some previous chest discomfort.  Saw GI.  Was placed on protonix.  Did not tolerate and went back on her prilosec.  She was not having any pain when I saw her.  She is scheduled for an EGD soon.  I can place an order for another GI MD in [place] if that is what she desires.  Just let me know.  Is she having breast pain now.  If so, I can place an order for a diagnostic mammogram.  If any problems, with breast, needs to be seen.  Also, she mentions chest pain.  I had discussed with her previously regarding cardiac w/up.  Last visit, she declined.  If chest pain, needs to be evaluated today.

## 2016-10-12 NOTE — Telephone Encounter (Signed)
Orders placed for GI and cardiology referral.

## 2016-10-12 NOTE — Telephone Encounter (Signed)
Patient called states that she would like referral to GI other than [facility]. She would like either local or [facility]. What ever you think is better. Seen Dr. Bosie Clos in [place] and did not like him.   She states that she has been having burning in chest into b/l breast wanted to know if you think that she needs to have her mammogram changed to diagnostic. States that she talked to you about it at physical. She states that she has screening mammogram for October. She is not having any d/c from breast or changes in nipple. She does not feel any knots or tissue changes.

## 2016-10-17 ENCOUNTER — Ambulatory Visit (INDEPENDENT_AMBULATORY_CARE_PROVIDER_SITE_OTHER): Payer: Medicare Other | Admitting: Family

## 2016-10-17 ENCOUNTER — Encounter: Payer: Self-pay | Admitting: Family

## 2016-10-17 VITALS — BP 104/78 | HR 75 | Temp 98.8°F | Ht 63.0 in | Wt 143.8 lb

## 2016-10-17 DIAGNOSIS — R300 Dysuria: Secondary | ICD-10-CM

## 2016-10-17 LAB — POCT URINALYSIS DIPSTICK
BILIRUBIN UA: NEGATIVE
GLUCOSE UA: NEGATIVE
Ketones, UA: NEGATIVE
NITRITE UA: NEGATIVE
Protein, UA: NEGATIVE
Spec Grav, UA: 1.02 (ref 1.010–1.025)
Urobilinogen, UA: 0.2 E.U./dL
pH, UA: 5.5 (ref 5.0–8.0)

## 2016-10-17 LAB — URINALYSIS, MICROSCOPIC ONLY

## 2016-10-17 NOTE — Progress Notes (Signed)
Subjective:    Patient ID: Carrie Noble, female    DOB: 06-19-1940, 76 y.o.   MRN: [id number]  CC: Carrie Noble is a 76 y.o. female who presents today for an acute visit.    HPI: CC: dysuria 3-4 days, unchanged, note suprapubic pain started yesterday  Has been taking tyleonol  OTC home kit shows UTI. No fever, chills, hematuria.  No recent UTIs.  H/o IBS  Would also like to know status of GI and cardiology referrals. Describes continued epigastric burning, unchanged since last visit with PCP.  PCP aware and has placed referral for GI and EGD.    Denies exertional chest pain or pressure, numbness or tingling radiating to left arm or jaw, palpitations, dizziness, frequent headaches, changes in vision, or shortness of breath.         HISTORY:  Past Medical History:  Diagnosis Date  . Allergy   . Arthritis   . Gastroesophageal reflux   . Hiatal hernia 1989   status post Nissen fundoplication   . Hx of hysterectomy   . Hyperlipidemia   . Tachycardia    Patient stated that this has been occuring frequently.  . Thyroid disease    Goiter   Past Surgical History:  Procedure Laterality Date  . ABDOMINAL HYSTERECTOMY  1980   partial  . BREAST BIOPSY Right    neg  . BREAST EXCISIONAL BIOPSY Left 2014   neg  . COLONOSCOPY  2015  . ESOPHAGOGASTRIC FUNDOPLICATION  5320  . LAPAROSCOPIC NISSEN FUNDOPLICATION  2334  . TONSILLECTOMY     as well as goiter  . UPPER GI ENDOSCOPY  2015   Family History  Problem Relation Age of Onset  . Stroke Mother 73  . Arthritis Mother   . Heart disease Mother   . Hypertension Mother   . Stroke Father 37  . Hypertension Father   . Rheumatic fever Brother        and multiple open heart surgeries  . Diabetes Brother        type 2  . Stroke Brother   . Other Sister        Coronary Atherosclerosis  . Stroke Brother   . Stroke Sister   . Heart disease Sister   . Dementia Sister   . Cancer Sister        ovarian    Allergies:  Darvocet [propoxyphene n-acetaminophen] and Morphine and related Current Outpatient Prescriptions on File Prior to Visit  Medication Sig Dispense Refill  . Biotin 1000 MCG tablet Take 1,000 mcg by mouth 3 (three) times daily. Reported on 05/13/2015    . Calcium Carbonate (CALCIUM 600 PO) Take by mouth daily. Reported on 05/13/2015    . Cholecalciferol (VITAMIN D PO) Take by mouth daily. Reported on 05/13/2015    . mometasone (NASONEX) 50 MCG/ACT nasal spray 1 SPRAY 1-2 TIMES PER DAY AS NEEDED. 17 g 5  . neomycin-polymyxin-dexamethasone (MAXITROL) 0.1 % ophthalmic suspension Place 1 drop into both eyes 2 (two) times daily.    . Olopatadine HCl (PAZEO) 0.7 % SOLN Apply 1 drop to eye daily. 2.5 mL 5  . omeprazole (PRILOSEC) 20 MG capsule TAKE 1 CAPSULE (20 MG TOTAL) BY MOUTH DAILY. 30 capsule 2  . PROAIR HFA 108 (90 Base) MCG/ACT inhaler INHALE 2 PUFFS INTO THE LUNGS EVERY 4 TO 6 HOURS AS NEEDED FOR COUGHING/WHEEZING  0  . ranitidine (ZANTAC) 150 MG tablet Take 150 mg by mouth at bedtime. Reported on 05/13/2015    .  tiZANidine (ZANAFLEX) 4 MG tablet Take 1 tablet (4 mg total) by mouth at bedtime as needed for muscle spasms. 20 tablet 0  . traMADol (ULTRAM) 50 MG tablet Take 50 mg by mouth 2 (two) times daily as needed. Reported on 05/13/2015  0  . vitamin B-12 (CYANOCOBALAMIN) 1000 MCG tablet Take 1,000 mcg by mouth daily. Reported on 05/13/2015     Current Facility-Administered Medications on File Prior to Visit  Medication Dose Route Frequency Provider Last Rate Last Dose  . betamethasone acetate-betamethasone sodium phosphate (CELESTONE) injection 3 mg  3 mg Intramuscular Once Edrick Kins, DPM        Social History  Substance Use Topics  . Smoking status: Never Smoker  . Smokeless tobacco: Never Used  . Alcohol use No    Review of Systems  Constitutional: Negative for chills and fever.  Respiratory: Negative for cough.   Cardiovascular: Negative for chest pain and palpitations.    Gastrointestinal: Negative for abdominal pain, nausea and vomiting.  Genitourinary: Positive for dysuria. Negative for frequency.      Objective:    BP 104/78   Pulse 75   Temp 98.8 F (37.1 C) (Oral)   Ht 5' 3"  (1.6 m)   Wt 143 lb 12.8 oz (65.2 kg)   SpO2 98%   BMI 25.47 kg/m    Physical Exam  Constitutional: She appears well-developed and well-nourished.  Cardiovascular: Normal rate, regular rhythm, normal heart sounds and normal pulses.   Pulmonary/Chest: Effort normal and breath sounds normal. She has no wheezes. She has no rhonchi. She has no rales.  Abdominal: There is no CVA tenderness.  Neurological: She is alert.  Skin: Skin is warm and dry.  Psychiatric: She has a normal mood and affect. Her speech is normal and behavior is normal. Thought content normal.  Vitals reviewed.      Assessment & Plan:   1. Dysuria Afebrile. Patient is well-appearing. Urine dipstick shows moderate leukocytes, small blood. Patient and I jointly agreed we'll await urine culture prior to treatment today. Advised plenty water. Return precautions given. Of note, I walked patient to front desk to speak with Lorriane Shire regarding status of referrals.  - POCT Urinalysis Dipstick - Urine Culture - Urine Microscopic    I am having Carrie Noble maintain her Cholecalciferol (VITAMIN D PO), Calcium Carbonate (CALCIUM 600 PO), vitamin B-12, Biotin, ranitidine, traMADol, neomycin-polymyxin-dexamethasone, mometasone, Olopatadine HCl, tiZANidine, PROAIR HFA, and omeprazole. We will continue to administer betamethasone acetate-betamethasone sodium phosphate.   No orders of the defined types were placed in this encounter.   Return precautions given.   Risks, benefits, and alternatives of the medications and treatment plan prescribed today were discussed, and patient expressed understanding.   Education regarding symptom management and diagnosis given to patient on AVS.  Continue to follow with Einar Pheasant, MD for routine health maintenance.   [name] and I agreed with plan.   Mable Paris, FNP

## 2016-10-17 NOTE — Patient Instructions (Addendum)
Let's await on urine culture  May start Azo  Plenty of water

## 2016-10-18 ENCOUNTER — Telehealth: Payer: Self-pay | Admitting: Internal Medicine

## 2016-10-18 NOTE — Telephone Encounter (Signed)
Patient has been taking uri stat and is going to continue taking it until culture is resulted.

## 2016-10-18 NOTE — Telephone Encounter (Signed)
Pt called and stated that she is in a lot of pain and wanted to know if M. Arnett would call something in for the pain as we wait for the results of her urine. Please advise, thank you!  Call pt  [phone]

## 2016-10-18 NOTE — Telephone Encounter (Signed)
Patient was seen by you yesterday and is requesting something for relief of the UTI. Micro and dip have been resulted. Still awaiting culture. Is there anything you can call in?

## 2016-10-19 ENCOUNTER — Other Ambulatory Visit: Payer: Medicare Other

## 2016-10-19 NOTE — Telephone Encounter (Signed)
Culture still pending.

## 2016-10-19 NOTE — Telephone Encounter (Signed)
Pt is scheduled with cardiology per Dr. Lorin Picket request. She is aware of appt. She is requesting a cb regarding her urine culture. Please cb  [phone]

## 2016-10-20 ENCOUNTER — Telehealth: Payer: Self-pay | Admitting: Family

## 2016-10-20 ENCOUNTER — Telehealth: Payer: Self-pay | Admitting: Internal Medicine

## 2016-10-20 DIAGNOSIS — N3000 Acute cystitis without hematuria: Secondary | ICD-10-CM

## 2016-10-20 LAB — URINE CULTURE
MICRO NUMBER:: 81054295
SPECIMEN QUALITY:: ADEQUATE

## 2016-10-20 MED ORDER — AMOXICILLIN-POT CLAVULANATE 875-125 MG PO TABS: 1.0000 | ORAL_TABLET | Freq: Two times a day (BID) | ORAL | 0 refills | Status: AC

## 2016-10-20 NOTE — Telephone Encounter (Signed)
Pt called back returning your call. Thank you! °

## 2016-10-20 NOTE — Telephone Encounter (Signed)
close

## 2016-10-20 NOTE — Telephone Encounter (Signed)
See result note.  

## 2016-10-24 DIAGNOSIS — I2089 Other forms of angina pectoris: Secondary | ICD-10-CM | POA: Insufficient documentation

## 2016-10-24 DIAGNOSIS — I10 Essential (primary) hypertension: Secondary | ICD-10-CM | POA: Insufficient documentation

## 2016-10-24 DIAGNOSIS — I208 Other forms of angina pectoris: Secondary | ICD-10-CM | POA: Insufficient documentation

## 2016-10-25 NOTE — Telephone Encounter (Signed)
Pt would like to know if she can go see Dr. Kinnie Scales instead of Dr. Servando Snare. She is requesting an approval from Dr. Lorin Picket Pt cb [phone]

## 2016-10-26 NOTE — Telephone Encounter (Signed)
Pt called and left message that she wanted to see Dr Troy Sine instead of Dr Servando Snare.  Do I need to place an order for a new referral?

## 2016-10-27 NOTE — Telephone Encounter (Signed)
Left message to return call to our office.  

## 2016-10-27 NOTE — Telephone Encounter (Signed)
Thank you :)

## 2016-10-27 NOTE — Telephone Encounter (Signed)
I refer pts to Dr Troy Sine.  I am very comfortable with him.  Also, regarding the amoxicillin, now long has she been on abx (total for uti).  Need to clarify - the burning may be related to possible yeast and urine touching the skin.  Can confirm with pt if feels more like vaginal irritation.  If so, then would try monistat cream - apply topically bid.

## 2016-10-27 NOTE — Telephone Encounter (Signed)
No, she said that she can schedule it without a referral, she just needs to be notified.

## 2016-10-27 NOTE — Telephone Encounter (Signed)
I'm calling her just to notify her that you are ok with her seeing Dr. Kinnie Scales at her request.

## 2016-10-27 NOTE — Telephone Encounter (Signed)
Spoke to pt to let her know that she can see Dr. Kinnie Scales if she chooses to, it is her decision. She is still uncertain b/c she has not see D.r Medoff in years. She has seen [facility] and was told that they can refer her to [facility] if she wants. She doesn't know which one to chose. She is asking me which one I would chose.   States that she has two more pills of her amoxicillin. She is not hurting when she uses the rest room but she still feels irritated when she does. Also states that the medication is irritating her stomach and she doesn't know if she is going to continue to take the medication since she has a lot of stomach issues anyway. She would like a cb to discuss if there is any other medications that she can take. [phone] says that you can leave detailed message or speak to her husband if she does not answer.

## 2016-10-27 NOTE — Telephone Encounter (Signed)
What does she need to be notified of?  Thanks

## 2016-10-27 NOTE — Telephone Encounter (Signed)
Please advise 

## 2016-10-28 ENCOUNTER — Ambulatory Visit (INDEPENDENT_AMBULATORY_CARE_PROVIDER_SITE_OTHER): Payer: Medicare Other | Admitting: Podiatry

## 2016-10-28 ENCOUNTER — Ambulatory Visit
Admission: RE | Admit: 2016-10-28 | Discharge: 2016-10-28 | Disposition: A | Discharge status: Home / Self Care | Payer: Medicare Other | Source: Ambulatory Visit | Attending: Internal Medicine | Admitting: Internal Medicine

## 2016-10-28 DIAGNOSIS — M722 Plantar fascial fibromatosis: Secondary | ICD-10-CM | POA: Diagnosis not present

## 2016-10-28 DIAGNOSIS — Z1239 Encounter for other screening for malignant neoplasm of breast: Secondary | ICD-10-CM

## 2016-10-28 DIAGNOSIS — Z1231 Encounter for screening mammogram for malignant neoplasm of breast: Secondary | ICD-10-CM | POA: Insufficient documentation

## 2016-10-31 ENCOUNTER — Ambulatory Visit (INDEPENDENT_AMBULATORY_CARE_PROVIDER_SITE_OTHER): Payer: Medicare Other

## 2016-10-31 VITALS — BP 124/72 | HR 66 | Temp 98.3°F | Resp 15 | Ht 63.5 in | Wt 142.8 lb

## 2016-10-31 DIAGNOSIS — Z1331 Encounter for screening for depression: Secondary | ICD-10-CM

## 2016-10-31 DIAGNOSIS — Z0000 Encounter for general adult medical examination without abnormal findings: Secondary | ICD-10-CM

## 2016-10-31 NOTE — Telephone Encounter (Signed)
Pt lvm  rtc

## 2016-10-31 NOTE — Progress Notes (Signed)
Subjective:   Carrie Noble is a 76 y.o. female who presents for Medicare Annual (Subsequent) preventive examination.  Review of Systems:  No ROS.  Medicare Wellness Visit. Additional risk factors are reflected in the social history.  Cardiac Risk Factors include: advanced age (>58men, >55 women)     Objective:     Vitals: BP 124/72 (BP Location: Left Arm, Patient Position: Sitting, Cuff Size: Normal)   Pulse 66   Temp 98.3 F (36.8 C) (Oral)   Resp 15   Ht 5' 3.5" (1.613 m)   Wt 142 lb 12.8 oz (64.8 kg)   SpO2 97%   BMI 24.90 kg/m   Body mass index is 24.9 kg/m.   Tobacco History  Smoking Status  . Never Smoker  Smokeless Tobacco  . Never Used     Counseling given: Not Answered   Past Medical History:  Diagnosis Date  . Allergy   . Arthritis   . Gastroesophageal reflux   . Hiatal hernia 1989   status post Nissen fundoplication   . Hx of hysterectomy   . Hyperlipidemia   . Tachycardia    Patient stated that this has been occuring frequently.  . Thyroid disease    Goiter   Past Surgical History:  Procedure Laterality Date  . ABDOMINAL HYSTERECTOMY  1980   partial  . BREAST BIOPSY Right    neg  . BREAST EXCISIONAL BIOPSY Left 2014   neg  . COLONOSCOPY  2015  . ESOPHAGOGASTRIC FUNDOPLICATION  1999  . LAPAROSCOPIC NISSEN FUNDOPLICATION  1999  . TONSILLECTOMY     as well as goiter  . UPPER GI ENDOSCOPY  2015   Family History  Problem Relation Age of Onset  . Stroke Mother 43  . Arthritis Mother   . Heart disease Mother   . Hypertension Mother   . Stroke Father 73  . Hypertension Father   . Rheumatic fever Brother        and multiple open heart surgeries  . Diabetes Brother        type 2  . Stroke Brother   . Other Sister        Coronary Atherosclerosis  . Stroke Brother   . Stroke Sister   . Heart disease Sister   . Dementia Sister   . Cancer Sister        ovarian   History  Sexual Activity  . Sexual activity: No    Outpatient  Encounter Prescriptions as of 10/31/2016  Medication Sig  . Biotin 1000 MCG tablet Take 1,000 mcg by mouth 3 (three) times daily. Reported on 05/13/2015  . Calcium Carbonate (CALCIUM 600 PO) Take by mouth daily. Reported on 05/13/2015  . Cholecalciferol (VITAMIN D PO) Take by mouth daily. Reported on 05/13/2015  . mometasone (NASONEX) 50 MCG/ACT nasal spray 1 SPRAY 1-2 TIMES PER DAY AS NEEDED.  Marland Kitchen neomycin-polymyxin-dexamethasone (MAXITROL) 0.1 % ophthalmic suspension Place 1 drop into both eyes 2 (two) times daily.  . Olopatadine HCl (PAZEO) 0.7 % SOLN Apply 1 drop to eye daily.  Marland Kitchen omeprazole (PRILOSEC) 20 MG capsule TAKE 1 CAPSULE (20 MG TOTAL) BY MOUTH DAILY.  . ranitidine (ZANTAC) 150 MG tablet Take 150 mg by mouth at bedtime. Reported on 05/13/2015  . tiZANidine (ZANAFLEX) 4 MG tablet Take 1 tablet (4 mg total) by mouth at bedtime as needed for muscle spasms.  . traMADol (ULTRAM) 50 MG tablet Take 50 mg by mouth 2 (two) times daily as needed. Reported on 05/13/2015  .  vitamin B-12 (CYANOCOBALAMIN) 1000 MCG tablet Take 1,000 mcg by mouth daily. Reported on 05/13/2015  . [DISCONTINUED] PROAIR HFA 108 (90 Base) MCG/ACT inhaler INHALE 2 PUFFS INTO THE LUNGS EVERY 4 TO 6 HOURS AS NEEDED FOR COUGHING/WHEEZING   Facility-Administered Encounter Medications as of 10/31/2016  Medication  . betamethasone acetate-betamethasone sodium phosphate (CELESTONE) injection 3 mg    Activities of Daily Living In your present state of health, do you have any difficulty performing the following activities: 10/31/2016  Hearing? N  Vision? N  Difficulty concentrating or making decisions? Y  Comment Admits difficulty remembering names  Walking or climbing stairs? Y  Comment R foot pain, intermittent  Dressing or bathing? N  Doing errands, shopping? N  Preparing Food and eating ? N  Using the Toilet? N  In the past six months, have you accidently leaked urine? Y  Comment Managed with daily pad  Do you have problems  with loss of bowel control? N  Managing your Medications? N  Managing your Finances? N  Housekeeping or managing your Housekeeping? N  Some recent data might be hidden    Patient Care Team: Dale [place], MD as PCP - General (Internal Medicine) Lemar Livings Merrily Pew, MD (General Surgery)    Assessment:    This is a routine wellness examination for [place]. The goal of the wellness visit is to assist the patient how to close the gaps in care and create a preventative care plan for the patient.   The roster of all physicians providing medical care to patient is listed in the Snapshot section of the chart.  Taking calcium VIT D as appropriate/Osteoporosis reviewed.    Safety issues reviewed; Smoke and carbon monoxide detectors in the home. No firearms in the home.  Wears seatbelts when driving or riding with others. Patient does wear sunscreen or protective clothing when in direct sunlight. No violence in the home.  Depression- PHQ 2 &9 complete.  No signs/symptoms or verbal communication regarding little pleasure in doing things, feeling down, depressed or hopeless. No changes in sleeping, energy, eating, concentrating.  No thoughts of self harm or harm towards others.  Time spent on this topic is 10 minutes.   Patient is alert, normal appearance, oriented to person/place/and time.  Correctly identified the president of the [place], recall of 3/3 words, and performing simple calculations. Displays appropriate judgement and can read correct time from watch face.   No new identified risk were noted.  No failures at ADL's or IADL's.    BMI- discussed the importance of a healthy diet, water intake and the benefits of aerobic exercise. Educational material provided.   Daily fluid intake: 2-3 cups of caffeine, 1 cups of water  Dental- every 6 months.   Eye- Visual acuity not assessed per patient preference since they have regular follow up with the ophthalmologist.  Wears corrective  lenses.  Sleep patterns- Sleeps 5-6 hours at night.  Wakes feeling.  Naps as needed.    Patient Concerns: None at this time. Follow up with PCP as needed.  Exercise Activities and Dietary recommendations Current Exercise Habits: The patient does not participate in regular exercise at present  Goals    . Increase physical activity          Use stationary bike as often as possible. Chair exercises as demonstrated.      . Increase water intake      Fall Risk Fall Risk  10/31/2016 10/17/2016 10/30/2015 10/09/2015 12/23/2013  Falls in the past year?  No No No Yes Yes  Number falls in past yr: - - - 1 1  Injury with Fall? - - - No Yes  Comment - - - - fractured left ankle   Depression Screen PHQ 2/9 Scores 10/31/2016 10/17/2016 10/30/2015 10/09/2015  PHQ - 2 Score 0 0 0 0  PHQ- 9 Score 0 - - -     Cognitive Function MMSE - Mini Mental State Exam 10/31/2016 10/30/2015  Orientation to time 5 5  Orientation to Place 5 5  Registration 3 3  Attention/ Calculation 3 5  Attention/Calculation-comments Difficulty subtracting simple calculations -  Recall 3 3  Language- name 2 objects 2 2  Language- repeat 1 1  Language- follow 3 step command 3 3  Language- read & follow direction 1 1  Write a sentence 1 1  Copy design 1 1  Total score 28 30        Immunization History  Administered Date(s) Administered  . Influenza,inj,Quad PF,6+ Mos 11/30/2012, 09/19/2013  . Td 09/19/2013   Screening Tests Health Maintenance  Topic Date Due  . INFLUENZA VACCINE  11/23/2017 (Originally 08/24/2016)  . PNA vac Low Risk Adult (1 of 2 - PCV13) 11/23/2017 (Originally 05/11/2004)  . MAMMOGRAM  10/28/2017  . TETANUS/TDAP  09/20/2023  . DEXA SCAN  Completed      Plan:   End of life planning; Advanced aging; Advanced directives discussed.  No HCPOA/Living Will.  Additional information provided to help them start the conversation with family.  Copy of HCPOA/Living Will requested upon completion. Time  spent on this topic is 20 minutes.  I have personally reviewed and noted the following in the patient's chart:   . Medical and social history . Use of alcohol, tobacco or illicit drugs  . Current medications and supplements . Functional ability and status . Nutritional status . Physical activity . Advanced directives . List of other physicians . Hospitalizations, surgeries, and ER visits in previous 12 months . Vitals . Screenings to include cognitive, depression, and falls . Referrals and appointments  In addition, I have reviewed and discussed with patient certain preventive protocols, quality metrics, and best practice recommendations. A written personalized care plan for preventive services as well as general preventive health recommendations were provided to patient.     Ashok Pall, LPN  16/01/958   Agree with plan. Rennie Plowman, NP

## 2016-10-31 NOTE — Patient Instructions (Addendum)
  Ms. Cervenka , Thank you for taking time to come for your Medicare Wellness Visit. I appreciate your ongoing commitment to your health goals. Please review the following plan we discussed and let me know if I can assist you in the future.   Follow up with Dr. Lorin Picket as needed.    Bring a copy of your Health Care Power of Attorney and/or Living Will to be scanned into chart once completed.  Have a great day!  These are the goals we discussed: Goals    . Increase physical activity          Use stationary bike as often as possible. Chair exercises as demonstrated.      . Increase water intake       This is a list of the screening recommended for you and due dates:  Health Maintenance  Topic Date Due  . Flu Shot  11/23/2017*  . Pneumonia vaccines (1 of 2 - PCV13) 11/23/2017*  . Mammogram  10/28/2017  . Tetanus Vaccine  09/20/2023  . DEXA scan (bone density measurement)  Completed  *Topic was postponed. The date shown is not the original due date.

## 2016-10-31 NOTE — Progress Notes (Signed)
   Subjective: 76 year old female presenting today for follow-up evaluation of plantar fasciitis of the right foot. She states she was doing much better after receiving the injection but her pain has returned in the past 6 weeks. She also reports a callus to the plantar aspect of the right heel. She is here for further evaluation and treatment.   Past Medical History:  Diagnosis Date  . Allergy   . Arthritis   . Gastroesophageal reflux   . Hiatal hernia 1989   status post Nissen fundoplication   . Hx of hysterectomy   . Hyperlipidemia   . Tachycardia    Patient stated that this has been occuring frequently.  . Thyroid disease    Goiter     Objective: Physical Exam General: The patient is alert and oriented x3 in no acute distress.  Dermatology: Skin is warm, dry and supple bilateral lower extremities. Negative for open lesions or macerations bilateral.   Vascular: Dorsalis Pedis and Posterior Tibial pulses palpable bilateral.  Capillary fill time is immediate to all digits.  Neurological: Epicritic and protective threshold intact bilateral.   Musculoskeletal: Tenderness to palpation at the medial calcaneal tubercale and through the insertion of the plantar fascia of the right foot. All other joints range of motion within normal limits bilateral. Strength 5/5 in all groups bilateral.    Assessment: 1. Plantar fasciitis right 2. Pain in right foot  Plan of Care:  1. Patient evaluated.  2. Injection of 0.5cc Celestone soluspan injected into the right plantar fascia. 3. Prescription for pain cream to be dispensed from [facility]. 4. Recommended OTC insoles from [employer]. 5. Return to clinic in 4 weeks.  Felecia Shelling, DPM [facility]  Dr. Felecia Shelling, DPM    [address] [place], [place] [address]                Office [phone]  Fax [phone]

## 2016-11-01 NOTE — Telephone Encounter (Signed)
Per patient states that she is not having any issues just frequency.  No burning or pain. No irritation in the vaginal area. As far as GI issues she would like to call GI and get them to send notes to GI at [facility]. If she has any other questions or issues she will call our office.

## 2016-11-07 NOTE — Telephone Encounter (Signed)
Pt lvm stating that she received a letter from [facility] stating that she needs to follow up with Dr. Lorin Picket regarding her mammogram results notating that she has dense breast, she said that she knew she did have dense breast but wants to know if she needs to do anything else. Pt cb [phone].

## 2016-11-07 NOTE — Telephone Encounter (Signed)
Spoke to patient received call this morning from [place] states that it was and error she should have not received that letter. She has no questions at this time will call if any changes.

## 2016-11-08 MED ORDER — BETAMETHASONE SOD PHOS & ACET 6 (3-3) MG/ML IJ SUSP: 3.0000 mg | Freq: Once | INTRAMUSCULAR | Status: AC

## 2016-11-15 ENCOUNTER — Ambulatory Visit: Payer: Medicare Other | Admitting: Gastroenterology

## 2016-11-15 ENCOUNTER — Telehealth: Payer: Self-pay | Admitting: Internal Medicine

## 2016-11-15 NOTE — Telephone Encounter (Signed)
Pt called c/o continuing breast pain and bladder issues having frequency. Pt wants to know if she should go back to the gynecologist? Please advise, thank you!  Call pt @ [phone]

## 2016-11-15 NOTE — Telephone Encounter (Signed)
Noted.  Let us know what gyn says.  Regarding breast biopsy - when did she have done and has she contacted their office.  We need results as well.  I do not see in chart.

## 2016-11-15 NOTE — Telephone Encounter (Signed)
Patient states that she just recently went  For an endoscopy. She is still having urinary frequency to the point of incontinence. She was asking if she needed follow up with you or Gyn. Told patient that if she wanted to call Gyn that was fine and if we needed to follow up with her we could call back. As far as breast pain, she has not yet received results from biopsy.

## 2016-11-15 NOTE — Telephone Encounter (Signed)
Patient is going to call gyn. She did not have breast biopsy done. Misunderstanding. [facility]. Mammogram results were normal. She is going to let us know what GYN says.

## 2016-11-25 ENCOUNTER — Encounter: Payer: Self-pay | Admitting: Podiatry

## 2016-11-25 ENCOUNTER — Ambulatory Visit (INDEPENDENT_AMBULATORY_CARE_PROVIDER_SITE_OTHER): Payer: Medicare Other | Admitting: Podiatry

## 2016-11-25 DIAGNOSIS — M722 Plantar fascial fibromatosis: Secondary | ICD-10-CM | POA: Diagnosis not present

## 2016-11-28 NOTE — Progress Notes (Signed)
   Subjective: 76 year old female presenting today for follow-up evaluation of plantar fasciitis of the right foot. She reports significant improvement in the pain of the right foot. She denies any new complaints at this time. She is here for further evaluation and treatment.   Past Medical History:  Diagnosis Date  . Allergy   . Arthritis   . Gastroesophageal reflux   . Hiatal hernia 1989   status post Nissen fundoplication   . Hx of hysterectomy   . Hyperlipidemia   . Tachycardia    Patient stated that this has been occuring frequently.  . Thyroid disease    Goiter     Objective: Physical Exam General: The patient is alert and oriented x3 in no acute distress.  Dermatology: Skin is warm, dry and supple bilateral lower extremities. Negative for open lesions or macerations bilateral.   Vascular: Dorsalis Pedis and Posterior Tibial pulses palpable bilateral.  Capillary fill time is immediate to all digits.  Neurological: Epicritic and protective threshold intact bilateral.   Musculoskeletal: Negative for tenderness to palpation at the medial calcaneal tubercale and through the insertion of the plantar fascia of the right foot. All other joints range of motion within normal limits bilateral. Strength 5/5 in all groups bilateral.    Assessment: 1. Plantar fasciitis right-resolved  Plan of Care:  1. Patient evaluated.  2. Continue wearing OTC insoles. 3. Recommended good shoe gear. 4. Return to clinic when necessary.   Felecia ShellingBrent M. Keifer Habib, DPM [facility]  Dr. Felecia ShellingBrent M. Shima Compere, DPM    [address]                                        [place], [place] [address]                Office [phone]  Fax [phone]

## 2016-11-29 ENCOUNTER — Telehealth: Payer: Self-pay | Admitting: Internal Medicine

## 2016-11-29 ENCOUNTER — Ambulatory Visit: Payer: Self-pay | Admitting: *Deleted

## 2016-11-29 NOTE — Telephone Encounter (Signed)
"  I got a call from the office saying I needed to get my flu shot"   "I just thought I would check and see if there was anything else I should be taking for my allergies".   After assessing her she decided she would just continue taking the OTC medication and would call us back if she didn't get any better or got worse.  Reason for Disposition . [1] Nasal allergies AND [2] only certain times of year (hay fever)  Answer Assessment - Initial Assessment Questions 1. SYMPTOM: "What's the main symptom you're concerned about?" (e.g., runny nose, stuffiness, sneezing, itching)     horseness and congestion going into my chest. 2. SEVERITY: "How bad is it?" "What does it keep you from doing?" (e.g., sleeping, working)      Not bad.   I'm not coughing 3. EYES: "Are the eyes also red, watery, and itchy?"      No 4. TRIGGER: "What pollen or other allergic substance do you think is causing the symptoms?"      Allergies.   I have them pretty much year round. 5. TREATMENT: "What medicine are you using?" "What medicine worked best in the past?"     Taking an OTC antihistamine that is helping. 6. OTHER SYMPTOMS: "Do you have any other symptoms?" (e.g., coughing, difficulty breathing, wheezing)     No 7. PREGNANCY: "Is there any chance you are pregnant?" "When was your last menstrual period?"     N/A  Protocols used: NASAL ALLERGIES (HAY FEVER)-A-AH

## 2016-11-29 NOTE — Telephone Encounter (Signed)
Copied from CRM #[id number]. Topic: Quick Communication - See Telephone Encounter >> Nov 29, 2016 10:14 AM Eston Mouldavis, Koi Yarbro B wrote: CRM for notification. See Telephone encounter for:    PT asking  for congestion and allergies if possible  11/29/16.

## 2016-11-29 NOTE — Telephone Encounter (Signed)
Copied from CRM #[id number]. Topic: Quick Communication - See Telephone Encounter >> Nov 29, 2016 10:14 AM Eston Mouldavis, Melysa Schroyer B wrote: CRM for notification. See Telephone encounter for:  11/29/16. PT asking for meds for congestion and allergies

## 2017-01-05 ENCOUNTER — Ambulatory Visit (INDEPENDENT_AMBULATORY_CARE_PROVIDER_SITE_OTHER): Payer: Medicare Other | Admitting: Allergy & Immunology

## 2017-01-05 ENCOUNTER — Encounter: Payer: Self-pay | Admitting: Allergy & Immunology

## 2017-01-05 VITALS — BP 118/80 | HR 80 | Ht 64.0 in | Wt 142.8 lb

## 2017-01-05 DIAGNOSIS — J309 Allergic rhinitis, unspecified: Secondary | ICD-10-CM

## 2017-01-05 DIAGNOSIS — J4520 Mild intermittent asthma, uncomplicated: Secondary | ICD-10-CM

## 2017-01-05 DIAGNOSIS — H1010 Acute atopic conjunctivitis, unspecified eye: Secondary | ICD-10-CM | POA: Diagnosis not present

## 2017-01-05 MED ORDER — ALBUTEROL SULFATE HFA 108 (90 BASE) MCG/ACT IN AERS: 2.0000 | INHALATION_SPRAY | RESPIRATORY_TRACT | 1 refills | Status: DC | PRN

## 2017-01-05 NOTE — Patient Instructions (Addendum)
1. Allergic rhinoconjunctivitis - Try using Eucrisa twice daily to the skin around the eyes to see if this helps with your symptoms. - We can consider doing patch testing in the future if needed to see if we can determine if you have reactivities to certain chemicals. - Samples of Eucrisa provided (call us if you like it and we can send in the script). - Try using a daily antihistamine (Zyrtec, Allegra, or Xyzal). - We will send in a refill for albuterol.   2. Return in about 3 months (around 04/05/2017).   Please inform us of any Emergency Department visits, hospitalizations, or changes in symptoms. Call us before going to the ED for breathing or allergy symptoms since we might be able to fit you in for a sick visit. Feel free to contact us anytime with any questions, problems, or concerns.  It was a pleasure to meet you today! Enjoy the holiday season!  Websites that have reliable patient information: 1. American Academy of Asthma, Allergy, and Immunology: www.aaaai.org 2. Food Allergy Research and Education (FARE): foodallergy.org 3. Mothers of Asthmatics: http://www.asthmacommunitynetwork.org 4. American College of Allergy, Asthma, and Immunology: www.acaai.org

## 2017-01-05 NOTE — Progress Notes (Signed)
FOLLOW UP  Date of Service/Encounter:  01/05/17   Assessment:   Allergic rhinoconjunctivitis versus contact dermatitis  Mild intermittent asthma, uncomplicated  Plan/Recommendations:   1. Allergic rhinoconjunctivitis - Try using Eucrisa twice daily to the skin around the eyes to see if this helps with your symptoms. - We can consider doing patch testing in the future if needed to see if we can determine if you have reactivities to certain chemicals. - Samples of Eucrisa provided (call us if you like it and we can send in the script). - Try using a daily antihistamine (Zyrtec, Allegra, or Xyzal). - We will send in a refill for albuterol.   2. Return in about 3 months (around 04/05/2017).  Subjective:   Carrie Noble is a 76 y.o. female presenting today for follow up of  Chief Complaint  Patient presents with  . Other    Pt c/o irritation, swelling and itching of both eyes. Syptoms for several years.    Carrie MorLinda W Whittaker has a history of the following: Patient Active Problem List   Diagnosis Date Noted  . Stress 09/04/2016  . Urge incontinence 09/04/2016  . Headache 02/16/2016  . Dry eyes 02/16/2016  . Acute bronchitis 11/26/2015  . Memory change 10/11/2015  . Allergic rhinitis 05/06/2015  . Breast tenderness in female 09/14/2014  . Pain in right hip 07/14/2014  . Carotid bruit 07/14/2014  . Difficulty sleeping 03/30/2014  . Health care maintenance 03/30/2014  . Diverticulosis 03/27/2013  . 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 Abnormal mammogram 09/06/2012  . Osteoporosis 05/29/2012  . Thyroid goiter 05/28/2012  . Barrett's esophagus 05/28/2012  . Hyperlipidemia 01/03/2011  . Palpitations 01/03/2011  . GERD (gastroesophageal reflux disease) 01/03/2011    History obtained from: chart review and patient.  Carrie Noble's Primary Care Provider is Dale DurhamScott, Charlene, MD.     Carrie Noble is a 76 y.o. female presenting for a follow up visit.  She  was last seen in April 2017 by Dr. Nunzio CobbsBobbitt.  At that time, she was endorsing thick postnasal drainage as well as a globus sensation and a sore throat.  She had recently been started on azelastine, montelukast, diphenhydramine, and Tessalon Perles.  She was endorsing ocular pruritus at that time as well.  She had seen her optometrist, who gave her neomycin/polymyxin/dexamethasone eyedrops with some improvement.  At that time, she was also complaining of mouth ulcers and sores.  At that visit, Dr. Nunzio CobbsBobbitt prescribed triamcinolone orabase.  She also received a prescription for Pazeo.   Since the last visit, she has mostly done well.  She is here today with planes of ocular pruritus.  Evidently, she has been evaluated by her ophthalmologist as well as her optometrist who all feel that this is related to allergies.  She has not tried any antihistamines because they tend to be very drying.  In fact, she has been worked up for rheumatologic causes of dry eyes, which was negative.  She has had this problem for quite some time and has received a multitude of treatments over the years, including Pataday without improvement (prescribed by Dr. Thelma CompEngle ENT). She has tried Neomyxin/Polymyxin/dexamethasone (prescribed by Dr. Marga Mel[facility]ickingedine [facility]) which did not work. The only drop that has worked is a steroid containing one prescribed in the late 1990s and she still has the packaging.   There are no new exposures. She does use make up and has stopped using it around her eyes for the past 1-2 months since she  felt that this might be related to her symptoms. This avoidance has not helped. She does not use nail polish. Her last skin testing was performed in 1998 and was positive to grass, ragweed, dust mite, and mixed feather. She does have asthma, which she treats with PRN albuterol. She does need a refill of the albuterol today.  Otherwise, there have been no changes to her past medical history, surgical history, family  history, or social history.    Review of Systems: a 14-point review of systems is pertinent for what is mentioned in HPI.  Otherwise, all other systems were negative. Constitutional: negative other than that listed in the HPI Eyes: negative other than that listed in the HPI Ears, nose, mouth, throat, and face: negative other than that listed in the HPI Respiratory: negative other than that listed in the HPI Cardiovascular: negative other than that listed in the HPI Gastrointestinal: negative other than that listed in the HPI Genitourinary: negative other than that listed in the HPI Integument: negative other than that listed in the HPI Hematologic: negative other than that listed in the HPI Musculoskeletal: negative other than that listed in the HPI Neurological: negative other than that listed in the HPI Allergy/Immunologic: negative other than that listed in the HPI    Objective:   Blood pressure 118/80, pulse 80, height 5\' 4"  (1.626 m), weight 142 lb 12.8 oz (64.8 kg), SpO2 98 %. Body mass index is 24.51 kg/m.   Physical Exam:  General: Alert, interactive, in no acute distress. Very pleasant and adorable.  Eyes: There is dried slightly erythematous skin surrounding both of her eyes with some mild peeling, No conjunctival injection bilaterally, no discharge on the right, no discharge on the left, no Horner-Trantas dots present and allergic shiners present bilaterally. PERRL bilaterally. EOMI without pain. No photophobia.  Ears: Right TM pearly gray with normal light reflex, Left TM pearly gray with normal light reflex, Right TM intact without perforation and Left TM intact without perforation.  Nose/Throat: External nose within normal limits, nasal crease present and septum midline. Turbinates edematous without discharge. Posterior oropharynx mildly erythematous without cobblestoning in the posterior oropharynx. Tonsils 2+ without exudates.  Tongue without thrush. Adenopathy: no  enlarged lymph nodes appreciated in the anterior cervical, occipital, axillary, epitrochlear, inguinal, or popliteal regions. Lungs: Clear to auscultation without wheezing, rhonchi or rales. No increased work of breathing. CV: Normal S1/S2. No murmurs. Capillary refill <2 seconds.  Skin: Warm and dry, without lesions or rashes. Neuro:   Grossly intact. No focal deficits appreciated. Responsive to questions.  Diagnostic studies: none    Malachi BondsJoel Zaydrian Batta, MD [id number]

## 2017-01-13 ENCOUNTER — Ambulatory Visit: Admit: 2017-01-13 | Payer: Medicare Other | Admitting: Gastroenterology

## 2017-01-13 SURGERY — ESOPHAGOGASTRODUODENOSCOPY (EGD) WITH PROPOFOL
Anesthesia: General

## 2017-01-23 ENCOUNTER — Ambulatory Visit: Payer: Medicare Other | Admitting: Allergy & Immunology

## 2017-02-23 ENCOUNTER — Telehealth: Payer: Self-pay | Admitting: Internal Medicine

## 2017-02-23 NOTE — Telephone Encounter (Signed)
Patient stated [facility] and would call them tomorrow. No referral needed. Advised her to call back if she needs anything.

## 2017-02-23 NOTE — Telephone Encounter (Signed)
Copied from CRM [phone]. Topic: Referral - Request >> Feb 23, 2017 11:59 AM Cecelia ByarsGreen, Temeka L, RMA wrote: Reason for CRM: patient is requesting a referral for a orthopedic in the [place] area, pt has seen Dr. Logan BoresEvans but is not satisfied and is requesting a referral to a new specialist

## 2017-02-23 NOTE — Telephone Encounter (Signed)
If it is foot pain, podiatry will need to evaluate.  Ortho - usually sees ankle and above.  I can refer to ortho if she desires, but I think they would want her to see podiatry

## 2017-02-23 NOTE — Telephone Encounter (Signed)
Patient stated that she has been having problems with her foot for about 6 months now off and on. She saw Dr. Logan BoresEvans, last visit was October. She was getting injections that seemed to help but now pain is back. Wondering who you recommended for ortho or if she needed to see you first?

## 2017-04-13 ENCOUNTER — Telehealth: Payer: Self-pay | Admitting: Internal Medicine

## 2017-04-13 NOTE — Telephone Encounter (Signed)
Patient returned call and stated that she is having a lot of pressure in her bladder. No c/o burning. She does have frequent urination but she is taking Mybetriq for the frequent urination. Please advise.

## 2017-04-13 NOTE — Telephone Encounter (Signed)
Copied from CRM [id number]. Topic: Quick Communication - See Telephone Encounter >> Apr 13, 2017  9:00 AM Trula SladeWalter, Neelie F wrote: CRM for notification. See Telephone encounter for: 04/13/17. Patient thinks she has an UTI and wants to know if she can come in to take a urine specimen.

## 2017-04-13 NOTE — Telephone Encounter (Signed)
Please advise 

## 2017-04-13 NOTE — Telephone Encounter (Signed)
Left message to call back to find out symptoms, etc.

## 2017-04-14 NOTE — Telephone Encounter (Signed)
Left message to call back  

## 2017-04-26 NOTE — Telephone Encounter (Signed)
Patient was seen at [facility]. 

## 2017-07-14 ENCOUNTER — Telehealth: Payer: Self-pay | Admitting: *Deleted

## 2017-07-14 NOTE — Telephone Encounter (Signed)
Copied from CRM [phone]. Topic: Quick Communication - Appointment Cancellation >> Jul 14, 2017 10:03 AM Lorrine KinMcGee, Demi B, NT wrote: Patient called to cancel appointment scheduled for 11/01/17. Patient has not rescheduled their appointment.  Route to department's PEC pool.

## 2017-07-14 NOTE — Telephone Encounter (Signed)
Patient has an appointment for 11/13/17

## 2017-08-07 ENCOUNTER — Ambulatory Visit (INDEPENDENT_AMBULATORY_CARE_PROVIDER_SITE_OTHER): Payer: Medicare Other | Admitting: Allergy & Immunology

## 2017-08-07 ENCOUNTER — Encounter: Payer: Self-pay | Admitting: Allergy & Immunology

## 2017-08-07 VITALS — BP 110/72 | HR 68 | Resp 18 | Ht 63.0 in | Wt 143.0 lb

## 2017-08-07 DIAGNOSIS — H1010 Acute atopic conjunctivitis, unspecified eye: Secondary | ICD-10-CM

## 2017-08-07 MED ORDER — OLOPATADINE HCL 0.7 % OP SOLN: 1.0000 [drp] | Freq: Every day | OPHTHALMIC | 5 refills | Status: DC

## 2017-08-07 NOTE — Progress Notes (Signed)
FOLLOW UP  Date of Service/Encounter:  08/07/17   Assessment:   Allergic conjunctivitis   Contact dermatitis of the eyelids  Plan/Recommendations:   1. Allergic rhinoconjunctivitis - We will send in a prescription for Pazeo eye drops.  - Continue with Eucrisa as needed.  - Try other antihistamines to see if this will help without overdrying: Allegra  - We will get lab work to see if you have evidence of any allergies, at which time we could do shots.  - She preferred to hold off on patch testing today.   2. Return in about 6 months (around 02/07/2018).  Subjective:   Carrie Noble is a 77 y.o. female presenting today for follow up of  Chief Complaint  Patient presents with  . Eye Problem    still having the burning and itching. everything else is good.     Carrie Noble has a history of the following: Patient Active Problem List   Diagnosis Date Noted  . Stress 09/04/2016  . Urge incontinence 09/04/2016  . Headache 02/16/2016  . Dry eyes 02/16/2016  . Acute bronchitis 11/26/2015  . Memory change 10/11/2015  . Allergic rhinitis 05/06/2015  . Breast tenderness in female 09/14/2014  . Pain in right hip 07/14/2014  . Carotid bruit 07/14/2014  . Difficulty sleeping 03/30/2014  . Health care maintenance 03/30/2014  . Diverticulosis 03/27/2013  . 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 Abnormal mammogram 09/06/2012  . Osteoporosis 05/29/2012  . Thyroid goiter 05/28/2012  . Barrett's esophagus 05/28/2012  . Hyperlipidemia 01/03/2011  . Palpitations 01/03/2011  . GERD (gastroesophageal reflux disease) 01/03/2011    History obtained from: chart review and patient.  Carrie Noble's Primary Care Provider is Dale DurhamScott, Charlene, MD.     Carrie Noble is a 77 y.o. female presenting for a follow up visit. She was last seen in December 2018. At that time, we added on Eucrisa twice daily as needed around the eyes to help with her dermatitis. We did discuss  doing patch testing in the future to see if we could find a trigger. We refilled her albuterol and tried her a daily antihistamines to help with the itching.    Since the last visit, she has mostly done well. She continues to have some problems with dry eyes, which does get worse with the use of Zyrtec and Xyzal. She has not tried using Allegra. Overall she is not taking antihistamines on a regular basis because the dryness is too intense. She has been using [place] eye drops with some improvement. She was using the Eucrisa at one point but it was causing some burning on the eye lids. She will occasionally still use it.   She thinks that this is related to her make up. She has tried a multitude of different brands, but all of them cause the symptoms to some extent. She is somewhat interested in patch testing, but by the end of the visit she decides that she wants to hold off. She was on allergy shots at one point in the 1990s  And gave them to herself. She is interested in getting allergy shots again, although she knows that she will not be able to give them to herself.   Otherwise, there have been no changes to her past medical history, surgical history, family history, or social history.    Review of Systems: a 14-point review of systems is pertinent for what is mentioned in HPI.  Otherwise, all other systems were negative.  Constitutional: negative other than that listed in the HPI Eyes: negative other than that listed in the HPI Ears, nose, mouth, throat, and face: negative other than that listed in the HPI Respiratory: negative other than that listed in the HPI Cardiovascular: negative other than that listed in the HPI Gastrointestinal: negative other than that listed in the HPI Genitourinary: negative other than that listed in the HPI Integument: negative other than that listed in the HPI Hematologic: negative other than that listed in the HPI Musculoskeletal: negative other than that listed  in the HPI Neurological: negative other than that listed in the HPI Allergy/Immunologic: negative other than that listed in the HPI    Objective:   Blood pressure 110/72, pulse 68, resp. rate 18, height 5\' 3"  (1.6 m), weight 143 lb (64.9 kg), SpO2 98 %. Body mass index is 25.33 kg/m.   Physical Exam:  General: Alert, interactive, in no acute distress. Pleasant talkative female.  Eyes: Red irritated eye lids bilaterally but overall improved compared to previous visits, No conjunctival injection bilaterally, no discharge on the right, no discharge on the left and no Horner-Trantas dots present. PERRL bilaterally. EOMI without pain. No photophobia.  Ears: Right TM pearly gray with normal light reflex, Left TM pearly gray with normal light reflex, Right TM intact without perforation and Left TM intact without perforation.  Nose/Throat: External nose within normal limits and septum midline. Turbinates edematous and pale with clear discharge. Posterior oropharynx mildly erythematous without cobblestoning in the posterior oropharynx. Tonsils 2+ without exudates.  Tongue without thrush. Lungs: Clear to auscultation without wheezing, rhonchi or rales. No increased work of breathing. CV: Normal S1/S2. No murmurs. Capillary refill <2 seconds.  Skin: Warm and dry, without lesions or rashes. Neuro:   Grossly intact. No focal deficits appreciated. Responsive to questions.  Diagnostic studies: none     Malachi Bonds, MD  [facility]

## 2017-08-07 NOTE — Patient Instructions (Addendum)
1. Allergic rhinoconjunctivitis - We will send in a prescription for Pazeo eye drops.  - Continue with Eucrisa as needed.  - Try other antihistamines to see if this will help without overdrying: Allegra  - We will get lab work to see if you have evidence of any allergies, at which time we could do shots.   2. Return in about 6 months (around 02/07/2018).   Please inform us of any Emergency Department visits, hospitalizations, or changes in symptoms. Call us before going to the ED for breathing or allergy symptoms since we might be able to fit you in for a sick visit. Feel free to contact us anytime with any questions, problems, or concerns.  It was a pleasure to see you again today!  Websites that have reliable patient information: 1. American Academy of Asthma, Allergy, and Immunology: www.aaaai.org 2. Food Allergy Research and Education (FARE): foodallergy.org 3. Mothers of Asthmatics: http://www.asthmacommunitynetwork.org 4. American College of Allergy, Asthma, and Immunology: MissingWeapons.cawww.acaai.org   Make sure you are registered to vote! If you have moved or changed any of your contact information, you will need to get this updated before voting!

## 2017-08-09 ENCOUNTER — Telehealth: Payer: Self-pay

## 2017-08-09 NOTE — Telephone Encounter (Signed)
Copied from CRM [id number]. Topic: Appointment Scheduling - Scheduling Inquiry for Clinic >> Aug 09, 2017 10:38 AM Oneal GroutSebastian, Jennifer S wrote: Reason for CRM: Requesting to see Dr Lorin PicketScott for stomach burning and pain. Does have a referral to see GI, unable to get in till Sept 2. Requesting to see Dr Lorin PicketScott asap. Please advise >> Aug 09, 2017 10:41 AM Oneal GroutSebastian, Jennifer S wrote: Soreness under ribs and breast, no chest pain

## 2017-08-09 NOTE — Telephone Encounter (Addendum)
Scheduled patient with Nurse practitioner for 08/10/17 Ad[facility].

## 2017-08-09 NOTE — Progress Notes (Signed)
Subjective:    Patient ID: Carrie Noble, female    DOB: July 05, 1940, 77 y.o.   MRN: [id number]  HPI  [name] is a 77 year old female who presents today with epigastric pain and burning.  Location: epigastric burning that is mostly occurring at night Onset/Timing: Occurs when going to bed and can wake her at 3am  Duration: Approximately one hour  Severity/Quality: Burning and noted as "uncomfortable"  Worse/Better by: Improves with sitting up; worse with lying down. She states she will get up and work on puzzles until her symptom improves then go back to bed.   Loss of appetite: No Nausea: No Vomiting: No Diarrhea: No Rectal bleeding: No Fever: No Weight loss: No Difficulty swallowing: No Melena/hematochezia: No  Denies chest pain, arm pain, jaw pain, diaphoresis, numbness, tingling, weakness, SOB.  History of GERD, remote history of Barrett's esophagus ( no evidence of esophagitis noted on endoscopy 11/11/2016) , and diverticulosis. Colonoscopy 02/2013 - tortuous colon, diverticulosis.  Recommended f/u colonoscopy in 10 years.  Recommend f/u colonoscopy in 10 years  She is followed by GI 04/11/17 and has Noble evaluated for epigastric pain/dyspepsia. She has previously taken omeprazole and wanted to decrease dose and then developed epigastric pain last summer and was evaluated by GI and started on pantoprazole 40 mg qd and use of tums or gaviscon was recommended in the evening for prn need. She was further evaluated by EGD which did not indicate esophagitis. History of Nissen fundoplication. EGD on 11/11/16 noted a normal looking esophagus, nissen fundoplication site appeared a little loose per provider's notes. No H pylori, dysplasia/malignancy was further stated in findings. Patient reports that she was advised to consider repair of Nissen however patient is not sure at this time if she is interested in doing so.  On 04/11/17, omeprazole was recommended of 20 mg qd and zantac at  night by GI. She was also advised to use acid neutralizers prn.  Dietary changes: She has worked on dietary triggers; will have coffee and tea daily.  Denies NSAID Use  She is taking omeprazole and zantac as prescribed now. She reports that she has missed zantac at night for a "week or so" and hs noticed worsening. She does use Tums and Gaviscon rarely, they provide limited/moderate benefit. She reports that benefit depends on what she had to eat.   Review of Systems See hpi  Past Medical History:  Diagnosis Date  . Allergy   . Arthritis   . Gastroesophageal reflux   . Hiatal hernia 1989   status post Nissen fundoplication   . Hx of hysterectomy   . Hyperlipidemia   . Tachycardia    Patient stated that this has Noble occuring frequently.  . Thyroid disease    Goiter     Social History   Socioeconomic History  . Marital status: Married    Spouse name: Not on file  . Number of children: Not on file  . Years of education: Not on file  . Highest education level: Not on file  Occupational History  . Occupation: Retired    Associate Professormployer: [employer]  Social Needs  . Financial resource strain: Not on file  . Food insecurity:    Worry: Not on file    Inability: Not on file  . Transportation needs:    Medical: Not on file    Non-medical: Not on file  Tobacco Use  . Smoking status: Never Smoker  . Smokeless tobacco: Never Used  Substance and Sexual  Activity  . Alcohol use: No    Alcohol/week: 0.0 oz  . Drug use: No  . Sexual activity: Never  Lifestyle  . Physical activity:    Days per week: Not on file    Minutes per session: Not on file  . Stress: Not on file  Relationships  . Social connections:    Talks on phone: Not on file    Gets together: Not on file    Attends religious service: Not on file    Active member of club or organization: Not on file    Attends meetings of clubs or organizations: Not on file    Relationship status: Not on file  . Intimate partner  violence:    Fear of current or ex partner: Not on file    Emotionally abused: Not on file    Physically abused: Not on file    Forced sexual activity: Not on file  Other Topics Concern  . Not on file  Social History Narrative   Occasionally drinks coffee.    Past Surgical History:  Procedure Laterality Date  . ABDOMINAL HYSTERECTOMY  1980   partial  . BREAST BIOPSY Right    neg  . BREAST EXCISIONAL BIOPSY Left 2014   neg  . COLONOSCOPY  2015  . ESOPHAGOGASTRIC FUNDOPLICATION  1999  . LAPAROSCOPIC NISSEN FUNDOPLICATION  1999  . TONSILLECTOMY     as well as goiter  . UPPER GI ENDOSCOPY  2015    Family History  Problem Relation Age of Onset  . Stroke Mother 29  . Arthritis Mother   . Heart disease Mother   . Hypertension Mother   . Stroke Father 62  . Hypertension Father   . Rheumatic fever Brother        and multiple open heart surgeries  . Diabetes Brother        type 2  . Stroke Brother   . Other Sister        Coronary Atherosclerosis  . Stroke Brother   . Stroke Sister   . Heart disease Sister   . Dementia Sister   . Cancer Sister        ovarian    Allergies  Allergen Reactions  . Darvocet [Propoxyphene N-Acetaminophen]     Patient stated that medication made her heart beat fast.  . Morphine And Related     Patient stated that medication made her heart beat fast.    Current Outpatient Medications on File Prior to Visit  Medication Sig Dispense Refill  . Biotin 1000 MCG tablet Take 1,000 mcg by mouth 3 (three) times daily. Reported on 05/13/2015    . Calcium Carbonate (CALCIUM 600 PO) Take by mouth daily. Reported on 05/13/2015    . Cholecalciferol (VITAMIN D PO) Take by mouth daily. Reported on 05/13/2015    . ketotifen (ALAWAY) 0.025 % ophthalmic solution 1 drop 2 (two) times daily.    . mometasone (NASONEX) 50 MCG/ACT nasal spray 1 SPRAY 1-2 TIMES PER DAY AS NEEDED. 17 g 5  . neomycin-polymyxin-dexamethasone (MAXITROL) 0.1 % ophthalmic suspension  Place 1 drop into both eyes 2 (two) times daily.    . Olopatadine HCl (PAZEO) 0.7 % SOLN Place 1 drop into both eyes daily. 1 Bottle 5  . omeprazole (PRILOSEC) 20 MG capsule TAKE 1 CAPSULE (20 MG TOTAL) BY MOUTH DAILY. 30 capsule 2  . ranitidine (ZANTAC) 150 MG tablet Take 150 mg by mouth at bedtime. Reported on 05/13/2015    . vitamin B-12 (  CYANOCOBALAMIN) 1000 MCG tablet Take 1,000 mcg by mouth daily. Reported on 05/13/2015     Current Facility-Administered Medications on File Prior to Visit  Medication Dose Route Frequency Provider Last Rate Last Dose  . betamethasone acetate-betamethasone sodium phosphate (CELESTONE) injection 3 mg  3 mg Intramuscular Once Gala Lewandowsky M, DPM      . betamethasone acetate-betamethasone sodium phosphate (CELESTONE) injection 3 mg  3 mg Intramuscular Once Evans, Brent M, DPM        BP 116/70 (BP Location: Left Arm, Patient Position: Sitting, Cuff Size: Normal)   Pulse 63   Temp 98 F (36.7 C) (Oral)   Resp 18   Wt 142 lb (64.4 kg)   SpO2 98%   BMI 25.15 kg/m       Objective:   Physical Exam  Constitutional: She is oriented to person, place, and time. She appears well-developed and well-nourished.  Eyes: Pupils are equal, round, and reactive to light.  Cardiovascular: Normal rate, regular rhythm and intact distal pulses.  Pulmonary/Chest: Effort normal and breath sounds normal.  Abdominal: Soft. Normal appearance and bowel sounds are normal. She exhibits no distension. There is no tenderness. There is no guarding, no CVA tenderness and negative Murphy's sign.  Musculoskeletal: She exhibits no edema.  Neurological: She is alert and oriented to person, place, and time.  Skin: Skin is warm and dry. Capillary refill takes less than 2 seconds. No rash noted.  Psychiatric: She has a normal mood and affect. Her behavior is normal. Judgment and thought content normal.      Assessment & Plan  1. Gastroesophageal reflux disease, esophagitis presence not  specified Symptoms are most consistent with reflux symptoms and limited use of zantac; exam is reassuring today. We discussed at length her GI history including findings from previous GI appointment and plans for further evaluation including ph/manometry/gallbladder evaluation if no improvement that GI provided indicated. We discussed lab work that could be obtained such as liver function and CBC. She has an upcoming appointment with PCP next month and would like to defer lab work today and will have blood work with PCP and GI provider if needed. Advised that she continue regimen of omeprazole and zantac daily as symptoms may have Noble aggravated when she stopped zantac on a trial basis.  Advised close follow up with GI; she has agreed to call today for an appointment. Reviewed triggers for symptoms and home care instructions to consider.  Follow up with GI and PCP as scheduled. Close return precautions provided.  Roddie Mc, FNP-C

## 2017-08-10 ENCOUNTER — Ambulatory Visit (INDEPENDENT_AMBULATORY_CARE_PROVIDER_SITE_OTHER): Payer: Medicare Other | Admitting: Family Medicine

## 2017-08-10 ENCOUNTER — Encounter: Payer: Self-pay | Admitting: Family Medicine

## 2017-08-10 VITALS — BP 116/70 | HR 63 | Temp 98.0°F | Resp 18 | Wt 142.0 lb

## 2017-08-10 DIAGNOSIS — K219 Gastro-esophageal reflux disease without esophagitis: Secondary | ICD-10-CM

## 2017-08-10 LAB — IGE+ALLERGENS ZONE 2(30)
Bahia Grass IgE: <0.1 kU/L
Bermuda Grass IgE: <0.1 kU/L
Cat Dander IgE: <0.1 kU/L
Cedar, Mountain IgE: <0.1 kU/L
Cladosporium Herbarum IgE: <0.1 kU/L
Cockroach, American IgE: <0.1 kU/L
D Farinae IgE: <0.1 kU/L
D Pteronyssinus IgE: <0.1 kU/L
Dog Dander IgE: <0.1 kU/L
Elm, American IgE: <0.1 kU/L
Hickory, White IgE: <0.1 kU/L
IgE (Immunoglobulin E), Serum: 74 IU/mL (ref 6–495)
Maple/Box Elder IgE: <0.1 kU/L
Nettle IgE: <0.1 kU/L
Oak, White IgE: <0.1 kU/L
Penicillium Chrysogen IgE: <0.1 kU/L
Plantain, English IgE: <0.1 kU/L
Ragweed, Short IgE: <0.1 kU/L
Sweet gum IgE RAST Ql: <0.1 kU/L
Timothy Grass IgE: <0.1 kU/L

## 2017-08-10 NOTE — Patient Instructions (Signed)
Please continue omeprazole and zantac as prescribed by your GI provider.  Follow up with GI for further evaluation as discussed.  Continue focusing on diet and avoiding triggers that cause reflux.  It was a pleasure to meet you today!  Home Care:  May include lifestyle changes such as weight loss, quitting smoking and alcohol consumption  Avoid foods and drinks that make your symptoms worse, such as:  Caffeine or alcoholic drinks  Chocolate  Peppermint or mint flavorings  Garlic and onions  Spicy foods  Citrus fruits, such as oranges, lemons, or limes  Tomato-based foods such as sauce, chili, salsa and pizza  Fried and fatty foods  Avoid lying down for 3 hours prior to your bedtime or prior to taking a nap  Eat small, frequent meals instead of a large meals  Wear loose-fitting clothing.  Do not wear anything tight around your waist that causes pressure on your stomach.  Raise the head of your bed 6 to 8 inches with wood blocks to help you sleep.  Extra pillows will not help.  Seek Help Right Away If:  You have pain in your arms, neck, jaw, teeth or back  Your pain increases or changes in intensity or duration  You develop nausea, vomiting or sweating (diaphoresis)  You develop shortness of breath or you faint  Your vomit is green, yellow, black or looks like coffee grounds or blood  Your stool is red, bloody or black  These symptoms could be signs of other problems, such as heart disease, gastric bleeding or esophageal bleeding.  Make sure you :  Understand these instructions.  Will watch your condition.  Will get help right away if you are not doing well or get worse.   Food Choices for Gastroesophageal Reflux Disease, Adult When you have gastroesophageal reflux disease (GERD), the foods you eat and your eating habits are very important. Choosing the right foods can help ease your discomfort. What guidelines do I need to follow?  Choose fruits,  vegetables, whole grains, and low-fat dairy products.  Choose low-fat meat, fish, and poultry.  Limit fats such as oils, salad dressings, butter, nuts, and avocado.  Keep a food diary. This helps you identify foods that cause symptoms.  Avoid foods that cause symptoms. These may be different for everyone.  Eat small meals often instead of 3 large meals a day.  Eat your meals slowly, in a place where you are relaxed.  Limit fried foods.  Cook foods using methods other than frying.  Avoid drinking alcohol.  Avoid drinking large amounts of liquids with your meals.  Avoid bending over or lying down until 2-3 hours after eating. What foods are not recommended? These are some foods and drinks that may make your symptoms worse: Vegetables Tomatoes. Tomato juice. Tomato and spaghetti sauce. Chili peppers. Onion and garlic. Horseradish. Fruits Oranges, grapefruit, and lemon (fruit and juice). Meats High-fat meats, fish, and poultry. This includes hot dogs, ribs, ham, sausage, salami, and bacon. Dairy Whole milk and chocolate milk. Sour cream. Cream. Butter. Ice cream. Cream cheese. Drinks Coffee and tea. Bubbly (carbonated) drinks or energy drinks. Condiments Hot sauce. Barbecue sauce. Sweets/Desserts Chocolate and cocoa. Donuts. Peppermint and spearmint. Fats and Oils High-fat foods. This includes [place] fries and potato chips. Other Vinegar. Strong spices. This includes black pepper, white pepper, red pepper, cayenne, curry powder, cloves, ginger, and chili powder. The items listed above may not be a complete list of foods and drinks to avoid. Contact your dietitian  for more information. This information is not intended to replace advice given to you by your health care provider. Make sure you discuss any questions you have with your health care provider. Document Released: 07/12/2011 Document Revised: 06/18/2015 Document Reviewed: 11/14/2012 Elsevier Interactive Patient  Education  2017 [employer]

## 2017-08-11 ENCOUNTER — Telehealth: Payer: Self-pay | Admitting: Allergy & Immunology

## 2017-08-11 NOTE — Telephone Encounter (Signed)
[facility] but [facility]. Insurance suggest that she tries Azelastine HCL, Epinastine HCL, or Ketofin Fumarate medications first. I called and let the patient know and she stated that it doesn't seem like the Pazeo sample we gave her was really helping. She still thinks that she is allergic to the eye makeup. Dr. Dellis AnesGallagher will you please advise?

## 2017-08-11 NOTE — Telephone Encounter (Signed)
Patient is calling about eye drops [facility] [place] said that insurance would not cover meds The meds are over $200 Patient states that the pharmacy contacted the office to see what could be done Patient hasnt heard anything  please call patient at [phone] or [phone]

## 2017-08-15 ENCOUNTER — Other Ambulatory Visit: Payer: Self-pay

## 2017-08-15 MED ORDER — EPINASTINE HCL 0.05 % OP SOLN: 1.0000 [drp] | Freq: Two times a day (BID) | OPHTHALMIC | 5 refills | Status: DC | PRN

## 2017-08-15 NOTE — Telephone Encounter (Signed)
Addressed in a result note earlier this morning.  Malachi BondsJoel Lashanta Elbe, MD [facility] 

## 2017-08-18 ENCOUNTER — Other Ambulatory Visit: Payer: Self-pay | Admitting: *Deleted

## 2017-08-18 ENCOUNTER — Telehealth: Payer: Self-pay | Admitting: Allergy & Immunology

## 2017-08-18 NOTE — Telephone Encounter (Signed)
I tried all 3 numbers and the "work phone" is her husband's number. It left a message asking for her to return our call since I had more questions for her. The mobile number on file is a [facility] number that should not be used they have stated it is not meant for residential use.

## 2017-08-18 NOTE — Telephone Encounter (Signed)
Patient called to cancel her patch testing Patient is NOT taking the Pazeo because it didn't help Patient also went to pick up her other medication and it was $45 for a quatity of five an she thought that was to expensive - so she isnt taking medications.

## 2017-08-21 ENCOUNTER — Ambulatory Visit: Payer: Medicare Other | Admitting: Allergy & Immunology

## 2017-08-23 ENCOUNTER — Encounter: Payer: Medicare Other | Admitting: Allergy

## 2017-08-23 NOTE — Telephone Encounter (Signed)
Called patient no answer.

## 2017-08-25 ENCOUNTER — Encounter: Payer: Medicare Other | Admitting: Allergy

## 2017-09-11 ENCOUNTER — Telehealth: Payer: Self-pay | Admitting: Allergy & Immunology

## 2017-09-11 NOTE — Telephone Encounter (Signed)
Left a message to return call.  

## 2017-09-11 NOTE — Telephone Encounter (Signed)
Spoke with pt, told her that a replaced eye drop had been sent in -epinastine. [facility].

## 2017-09-11 NOTE — Telephone Encounter (Signed)
Patient is confused about which medication should have been ordered as she received bill for it but never got medication. Please call back to clarify her meds. Thanks

## 2017-09-21 ENCOUNTER — Other Ambulatory Visit: Payer: Self-pay | Admitting: Internal Medicine

## 2017-09-21 DIAGNOSIS — Z1231 Encounter for screening mammogram for malignant neoplasm of breast: Secondary | ICD-10-CM

## 2017-11-01 ENCOUNTER — Ambulatory Visit: Payer: Medicare Other

## 2017-11-13 ENCOUNTER — Telehealth: Payer: Self-pay

## 2017-11-13 ENCOUNTER — Ambulatory Visit
Admission: RE | Admit: 2017-11-13 | Discharge: 2017-11-13 | Disposition: A | Discharge status: Home / Self Care | Payer: Medicare Other | Source: Ambulatory Visit | Attending: Internal Medicine | Admitting: Internal Medicine

## 2017-11-13 ENCOUNTER — Encounter: Payer: Medicare Other | Admitting: Internal Medicine

## 2017-11-13 DIAGNOSIS — Z1231 Encounter for screening mammogram for malignant neoplasm of breast: Secondary | ICD-10-CM | POA: Diagnosis present

## 2017-11-13 NOTE — Telephone Encounter (Signed)
Copied from CRM [id number]. Topic: Quick Communication - See Telephone Encounter >> Nov 13, 2017  8:54 AM Waymon Amato wrote: Pt  is needing to talk with Dr. 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

## 2017-11-13 NOTE — Telephone Encounter (Signed)
Spoke with patient to get more information about what is going on. She was supposed to have an appt with pcp today. Advised patient that I could schedule her an appt with another provider in the office but pt declined. She feels that her chest pain is more indigestion. This is not an acute issue that just started. Explained that she may have to have an OV for mammogram to be completed with her having new breast concerns. Patient stated that she would see how she felt through out the day and if any of her sx worsen she will go to the walk in clinic.

## 2017-11-30 ENCOUNTER — Encounter: Payer: Self-pay | Admitting: Internal Medicine

## 2017-11-30 ENCOUNTER — Ambulatory Visit (INDEPENDENT_AMBULATORY_CARE_PROVIDER_SITE_OTHER): Payer: Medicare Other | Admitting: Internal Medicine

## 2017-11-30 ENCOUNTER — Encounter

## 2017-11-30 VITALS — BP 130/70 | HR 70 | Temp 97.9°F | Resp 18 | Ht 63.0 in | Wt 138.0 lb

## 2017-11-30 DIAGNOSIS — R29898 Other symptoms and signs involving the musculoskeletal system: Secondary | ICD-10-CM

## 2017-11-30 DIAGNOSIS — M810 Age-related osteoporosis without current pathological fracture: Secondary | ICD-10-CM

## 2017-11-30 DIAGNOSIS — E559 Vitamin D deficiency, unspecified: Secondary | ICD-10-CM | POA: Diagnosis not present

## 2017-11-30 DIAGNOSIS — E049 Nontoxic goiter, unspecified: Secondary | ICD-10-CM | POA: Diagnosis not present

## 2017-11-30 DIAGNOSIS — Z0000 Encounter for general adult medical examination without abnormal findings: Secondary | ICD-10-CM

## 2017-11-30 DIAGNOSIS — R079 Chest pain, unspecified: Secondary | ICD-10-CM

## 2017-11-30 DIAGNOSIS — F439 Reaction to severe stress, unspecified: Secondary | ICD-10-CM

## 2017-11-30 DIAGNOSIS — E785 Hyperlipidemia, unspecified: Secondary | ICD-10-CM

## 2017-11-30 DIAGNOSIS — K219 Gastro-esophageal reflux disease without esophagitis: Secondary | ICD-10-CM

## 2017-11-30 DIAGNOSIS — K2270 Barrett's esophagus without dysplasia: Secondary | ICD-10-CM

## 2017-11-30 LAB — BASIC METABOLIC PANEL
BUN: 19 mg/dL (ref 6–23)
CHLORIDE: 101 meq/L (ref 96–112)
CO2: 31 mEq/L (ref 19–32)
Calcium: 9.7 mg/dL (ref 8.4–10.5)
Creatinine, Ser: 0.97 mg/dL (ref 0.40–1.20)
GFR: 58.95 mL/min — AB (ref >60.00)
Glucose, Bld: 103 mg/dL — ABNORMAL HIGH (ref 70–99)
Potassium: 4.7 mEq/L (ref 3.5–5.1)
Sodium: 138 mEq/L (ref 135–145)

## 2017-11-30 LAB — HEPATIC FUNCTION PANEL
ALT: 19 U/L (ref 0–35)
AST: 21 U/L (ref 0–37)
Albumin: 4.5 g/dL (ref 3.5–5.2)
Alkaline Phosphatase: 120 U/L — ABNORMAL HIGH (ref 39–117)
BILIRUBIN DIRECT: 0.1 mg/dL (ref 0.0–0.3)
BILIRUBIN TOTAL: 0.5 mg/dL (ref 0.2–1.2)
Total Protein: 7.5 g/dL (ref 6.0–8.3)

## 2017-11-30 LAB — CBC WITH DIFFERENTIAL/PLATELET
BASOS PCT: 1.1 % (ref 0.0–3.0)
Basophils Absolute: 0.1 10*3/uL (ref 0.0–0.1)
EOS ABS: 0.1 10*3/uL (ref 0.0–0.7)
EOS PCT: 2.8 % (ref 0.0–5.0)
HCT: 40.1 % (ref 36.0–46.0)
HEMOGLOBIN: 13.7 g/dL (ref 12.0–15.0)
Lymphocytes Relative: 39 % (ref 12.0–46.0)
Lymphs Abs: 1.9 10*3/uL (ref 0.7–4.0)
MCHC: 34.2 g/dL (ref 30.0–36.0)
MCV: 93.3 fl (ref 78.0–100.0)
Monocytes Absolute: 0.4 10*3/uL (ref 0.1–1.0)
Monocytes Relative: 8.1 % (ref 3.0–12.0)
Neutro Abs: 2.4 10*3/uL (ref 1.4–7.7)
Neutrophils Relative %: 49 % (ref 43.0–77.0)
Platelets: 354 10*3/uL (ref 150.0–400.0)
RBC: 4.3 Mil/uL (ref 3.87–5.11)
RDW: 13.3 % (ref 11.5–15.5)
WBC: 4.9 10*3/uL (ref 4.0–10.5)

## 2017-11-30 LAB — SEDIMENTATION RATE: SED RATE: 29 mm/h (ref 0–30)

## 2017-11-30 LAB — LIPID PANEL
CHOLESTEROL: 266 mg/dL — AB (ref 0–200)
HDL: 60.7 mg/dL (ref >39.00)
LDL CALC: 180 mg/dL — AB (ref 0–99)
NONHDL: 205.3
Total CHOL/HDL Ratio: 4
Triglycerides: 125 mg/dL (ref 0.0–149.0)
VLDL: 25 mg/dL (ref 0.0–40.0)

## 2017-11-30 LAB — CK: CK TOTAL: 53 U/L (ref 7–177)

## 2017-11-30 LAB — VITAMIN D 25 HYDROXY (VIT D DEFICIENCY, FRACTURES): VITD: 25.3 ng/mL — AB (ref 30.00–100.00)

## 2017-11-30 LAB — TSH: TSH: 2.15 u[IU]/mL (ref 0.35–4.50)

## 2017-11-30 NOTE — Assessment & Plan Note (Signed)
Physical today 11/30/17.  Mammogram 11/14/17 - briads I.  Colonoscopy 02/2013.  Recommended f/u in 10 years.

## 2017-11-30 NOTE — Progress Notes (Signed)
Patient ID: Carrie Noble, female   DOB: Feb 15, 1940, 77 y.o.   MRN: [id number]   Subjective:    Patient ID: Carrie Noble, female    DOB: 19-May-1940, 77 y.o.   MRN: [id number]  HPI  Patient here for her physical exam.  Also has several concerns.  Was seen by urology recently for urinary incontinence.  Prescribed mirabegron and f/u in 3 months.  She reports chest pain.  Noticed several weeks ago.  Was not sure if related to acid reflux, etc.  Some sob with exertion.  Intermittent discomfort.  She is eating.  Some nausea.  Shoulders sore.  Swallowing ok.  Has been worked up for epigastric pain and heart burn.  Had f/u EGD 10/2016 - normal esophagus, nissen fundoplication found.  Wrap appeared to be loose.  On omeprazole.  Taking citrucel for her bowels.  Previous abdominal ultrasound - minimal fatty liver, otherwise normal.  Weight is down.  She has adjusted her diet some.  No vomiting.  Request to have MMR checked.  Describes some persistent leg weakness.  Saw podiatry.  Was given exercises and stretches.  Described more as fatigue. No focal weakness.     Past Medical History:  Diagnosis Date  . Allergy   . Arthritis   . Gastroesophageal reflux   . Hiatal hernia 1989   status post Nissen fundoplication   . Hx of hysterectomy   . Hyperlipidemia   . Tachycardia    Patient stated that this has been occuring frequently.  . Thyroid disease    Goiter   Past Surgical History:  Procedure Laterality Date  . ABDOMINAL HYSTERECTOMY  1980   partial  . BREAST BIOPSY Right    neg  . BREAST EXCISIONAL BIOPSY Left 2014   neg  . COLONOSCOPY  2015  . ESOPHAGOGASTRIC FUNDOPLICATION  2536  . LAPAROSCOPIC NISSEN FUNDOPLICATION  6440  . TONSILLECTOMY     as well as goiter  . UPPER GI ENDOSCOPY  2015   Family History  Problem Relation Age of Onset  . Stroke Mother 48  . Arthritis Mother   . Heart disease Mother   . Hypertension Mother   . Stroke Father 83  . Hypertension Father   . Rheumatic  fever Brother        and multiple open heart surgeries  . Diabetes Brother        type 2  . Stroke Brother   . Other Sister        Coronary Atherosclerosis  . Stroke Brother   . Stroke Sister   . Heart disease Sister   . Dementia Sister   . Cancer Sister        ovarian   Social History   Socioeconomic History  . Marital status: Married    Spouse name: Not on file  . Number of children: Not on file  . Years of education: Not on file  . Highest education level: Not on file  Occupational History  . Occupation: Retired    Fish farm manager: OTHER  Social Needs  . Financial resource strain: Not on file  . Food insecurity:    Worry: Not on file    Inability: Not on file  . Transportation needs:    Medical: Not on file    Non-medical: Not on file  Tobacco Use  . Smoking status: Never Smoker  . Smokeless tobacco: Never Used  Substance and Sexual Activity  . Alcohol use: No    Alcohol/week: 0.0 standard drinks  .  Drug use: No  . Sexual activity: Never  Lifestyle  . Physical activity:    Days per week: Not on file    Minutes per session: Not on file  . Stress: Not on file  Relationships  . Social connections:    Talks on phone: Not on file    Gets together: Not on file    Attends religious service: Not on file    Active member of club or organization: Not on file    Attends meetings of clubs or organizations: Not on file    Relationship status: Not on file  Other Topics Concern  . Not on file  Social History Narrative   Occasionally drinks coffee.    Outpatient Encounter Medications as of 11/30/2017  Medication Sig  . mirabegron ER (MYRBETRIQ) 25 MG TB24 tablet Take by mouth.  . Biotin 1000 MCG tablet Take 1,000 mcg by mouth 3 (three) times daily. Reported on 05/13/2015  . Calcium Carbonate (CALCIUM 600 PO) Take by mouth daily. Reported on 05/13/2015  . Cholecalciferol (VITAMIN D PO) Take by mouth daily. Reported on 05/13/2015  . Epinastine HCl 0.05 % ophthalmic solution  Place 1 drop into both eyes 2 (two) times daily as needed.  Marland Kitchen ketotifen (ALAWAY) 0.025 % ophthalmic solution 1 drop 2 (two) times daily.  . mometasone (NASONEX) 50 MCG/ACT nasal spray 1 SPRAY 1-2 TIMES PER DAY AS NEEDED.  Marland Kitchen neomycin-polymyxin-dexamethasone (MAXITROL) 0.1 % ophthalmic suspension Place 1 drop into both eyes 2 (two) times daily.  . Olopatadine HCl (PAZEO) 0.7 % SOLN Place 1 drop into both eyes daily.  Marland Kitchen omeprazole (PRILOSEC) 20 MG capsule TAKE 1 CAPSULE (20 MG TOTAL) BY MOUTH DAILY.  . vitamin B-12 (CYANOCOBALAMIN) 1000 MCG tablet Take 1,000 mcg by mouth daily. Reported on 05/13/2015  . [DISCONTINUED] ranitidine (ZANTAC) 150 MG tablet Take 150 mg by mouth at bedtime. Reported on 05/13/2015   Facility-Administered Encounter Medications as of 11/30/2017  Medication  . betamethasone acetate-betamethasone sodium phosphate (CELESTONE) injection 3 mg  . betamethasone acetate-betamethasone sodium phosphate (CELESTONE) injection 3 mg    Review of Systems  Constitutional:       Weight is down. Has adjusted her diet some.  Some nausea.    HENT: Negative for congestion and sinus pressure.   Eyes: Negative for pain and visual disturbance.  Respiratory: Negative for cough and chest tightness.        Some sob with exertion.    Cardiovascular: Negative for palpitations and leg swelling.       Chest discomfort as outlined.    Gastrointestinal: Positive for nausea. Negative for abdominal pain, diarrhea and vomiting.  Genitourinary: Negative for difficulty urinating and dysuria.  Musculoskeletal: Negative for joint swelling and myalgias.  Skin: Negative for color change and rash.  Neurological: Negative for dizziness, light-headedness and headaches.  Hematological: Negative for adenopathy. Does not bruise/bleed easily.  Psychiatric/Behavioral: Negative for agitation and dysphoric mood.       Objective:    Physical Exam  Constitutional: She is oriented to person, place, and time. She  appears well-developed and well-nourished. No distress.  HENT:  Nose: Nose normal.  Mouth/Throat: Oropharynx is clear and moist.  Eyes: Right eye exhibits no discharge. Left eye exhibits no discharge. No scleral icterus.  Neck: Neck supple. No thyromegaly present.  Cardiovascular: Normal rate and regular rhythm.  Pulmonary/Chest: Breath sounds normal. No accessory muscle usage. No tachypnea. No respiratory distress. She has no decreased breath sounds. She has no wheezes. She has no rhonchi.  Right breast exhibits no inverted nipple, no mass, no nipple discharge and no tenderness (no axillary adenopathy). Left breast exhibits no inverted nipple, no mass, no nipple discharge and no tenderness (no axilarry adenopathy).  Abdominal: Soft. Bowel sounds are normal. There is no tenderness.  Musculoskeletal: She exhibits no edema or tenderness.  Lymphadenopathy:    She has no cervical adenopathy.  Neurological: She is alert and oriented to person, place, and time.  Skin: No rash noted. No erythema.  Psychiatric: She has a normal mood and affect. Her behavior is normal.    BP 130/70 (BP Location: Left Arm, Patient Position: Sitting, Cuff Size: Normal)   Pulse 70   Temp 97.9 F (36.6 C) (Oral)   Resp 18   Ht _0  (1.6 m)   Wt 138 lb (62.6 kg)   SpO2 97%   BMI 24.45 kg/m  Wt Readings from Last 3 Encounters:  11/30/17 138 lb (62.6 kg)  08/10/17 142 lb (64.4 kg)  08/07/17 143 lb (64.9 kg)     Lab Results  Component Value Date   WBC 4.9 11/30/2017   HGB 13.7 11/30/2017   HCT 40.1 11/30/2017   PLT 354.0 11/30/2017   GLUCOSE 103 (H) 11/30/2017   CHOL 266 (H) 11/30/2017   TRIG 125.0 11/30/2017   HDL 60.70 11/30/2017   LDLDIRECT 141.0 09/01/2016   LDLCALC 180 (H) 11/30/2017   ALT 19 11/30/2017   AST 21 11/30/2017   NA 138 11/30/2017   K 4.7 11/30/2017   CL 101 11/30/2017   CREATININE 0.97 11/30/2017   BUN 19 11/30/2017   CO2 31 11/30/2017   TSH 2.15 11/30/2017   HGBA1C 5.7  03/29/2013    Mm 3d Screen Breast Bilateral  Result Date: 11/14/2017 CLINICAL DATA:  Screening. EXAM: DIGITAL SCREENING BILATERAL MAMMOGRAM WITH TOMO AND CAD COMPARISON:  Previous exam(s). ACR Breast Density Category c: The breast tissue is heterogeneously dense, which may obscure small masses. FINDINGS: There are no findings suspicious for malignancy. Images were processed with CAD. IMPRESSION: No mammographic evidence of malignancy. A result letter of this screening mammogram will be mailed directly to the patient. RECOMMENDATION: Screening mammogram in one year. (Code:SM-B-01Y) BI-RADS CATEGORY  1: Negative. Electronically Signed   By: Franki Cabot M.D.   On: 11/14/2017 08:42       Assessment & Plan:   Problem List Items Addressed This Visit    Barrett's esophagus    Had EGD as outlined.  Persistent issues as described.  Increased omeprazole to bid.  Request referral to Dr Allyn Kenner.        Chest pain    Described chest pain and breast pain.  Appeared to be two different pains.  Recent mammogram ok.  Breast pain resolved.  Intermittent chest discomfort.  EKG SR with no acute ischemic changes.  Discussed further evaluation.  Refer to cardiology for question of need for further w/up.  Increase omeprazole.        Relevant Orders   EKG 12-Lead (Completed)   Ambulatory referral to Cardiology   GERD (gastroesophageal reflux disease)    EGD as outlined.  Increase omeprazole to bid.  Request referral to Dr Allyn Kenner.        Relevant Orders   Ambulatory referral to Gastroenterology   Health care maintenance    Physical today 11/30/17.  Mammogram 11/14/17 - briads I.  Colonoscopy 02/2013.  Recommended f/u in 10 years.        Hyperlipidemia    Low cholesterol diet and exercise.  Follow lipid panel.        Relevant Orders   Hepatic function panel (Completed)   Lipid panel (Completed)   Basic metabolic panel (Completed)   Leg weakness    Describes leg weakness.  Saw podiatry.  Was given  exercise and stretches.  Check CK and ESR.        Relevant Orders   CBC with Differential/Platelet (Completed)   Sedimentation rate (Completed)   CK (Creatine Kinase) (Completed)   Osteoporosis    Weight bearing exercise.  Calcium and vitamin D.  Check vitamin D level.        Stress    Overall doing well.  Follow.        Thyroid goiter    Has been followed by endocrinology.  Check tsh.       Relevant Orders   TSH (Completed)   Vitamin D deficiency    Check vitamin D level.        Relevant Orders   VITAMIN D 25 Hydroxy (Vit-D Deficiency, Fractures) (Completed)    Other Visit Diagnoses    Routine general medical examination at a health care facility    -  Primary   Relevant Orders   Measles/Mumps/Rubella Immunity (Completed)       Einar Pheasant, MD

## 2017-12-01 LAB — MEASLES/MUMPS/RUBELLA IMMUNITY
MUMPS IGG: 10.1 [AU]/ml — AB
RUBELLA: 7.17 {index}

## 2017-12-03 ENCOUNTER — Encounter: Payer: Self-pay | Admitting: Internal Medicine

## 2017-12-03 DIAGNOSIS — R079 Chest pain, unspecified: Secondary | ICD-10-CM | POA: Insufficient documentation

## 2017-12-03 NOTE — Assessment & Plan Note (Signed)
Described chest pain and breast pain.  Appeared to be two different pains.  Recent mammogram ok.  Breast pain resolved.  Intermittent chest discomfort.  EKG SR with no acute ischemic changes.  Discussed further evaluation.  Refer to cardiology for question of need for further w/up.  Increase omeprazole.

## 2017-12-03 NOTE — Assessment & Plan Note (Signed)
EGD as outlined.  Increase omeprazole to bid.  Request referral to Dr Troy Sine.

## 2017-12-03 NOTE — Assessment & Plan Note (Signed)
Weight bearing exercise.  Calcium and vitamin D.  Check vitamin D level.   

## 2017-12-03 NOTE — Assessment & Plan Note (Signed)
Check vitamin D level 

## 2017-12-03 NOTE — Assessment & Plan Note (Signed)
Overall doing well.  Follow.   

## 2017-12-03 NOTE — Assessment & Plan Note (Signed)
Has been followed by endocrinology.  Check tsh.

## 2017-12-03 NOTE — Assessment & Plan Note (Signed)
Low cholesterol diet and exercise.  Follow lipid panel.   

## 2017-12-03 NOTE — Assessment & Plan Note (Signed)
Describes leg weakness.  Saw podiatry.  Was given exercise and stretches.  Check CK and ESR.

## 2017-12-03 NOTE — Assessment & Plan Note (Signed)
Had EGD as outlined.  Persistent issues as described.  Increased omeprazole to bid.  Request referral to Dr Troy Sine.

## 2017-12-04 ENCOUNTER — Other Ambulatory Visit: Payer: Self-pay | Admitting: Internal Medicine

## 2017-12-04 ENCOUNTER — Telehealth: Payer: Self-pay | Admitting: Internal Medicine

## 2017-12-04 ENCOUNTER — Other Ambulatory Visit: Payer: Self-pay

## 2017-12-04 MED ORDER — ROSUVASTATIN CALCIUM 10 MG PO TABS: 10.0000 mg | ORAL_TABLET | Freq: Every day | ORAL | 1 refills | Status: DC

## 2017-12-04 NOTE — Telephone Encounter (Signed)
Please advise 

## 2017-12-04 NOTE — Telephone Encounter (Signed)
If she wants to see her previous cardiologist, let me know who she wants to see and we can schedule.  Sorry for the confusion.

## 2017-12-04 NOTE — Telephone Encounter (Signed)
Requested medication (s) are due for refill today: yes  Requested medication (s) are on the active medication list: yes    Last refill: 08/12/16  Future visit scheduled yes 01/03/18  Dr Lorin Picket  Notes to clinic:  Rx was changed to twice daily on appt 11/30/17  Requested Prescriptions  Pending Prescriptions Disp Refills   omeprazole (PRILOSEC) 20 MG capsule 30 capsule 2    Sig: Take 1 capsule (20 mg total) by mouth daily.     Gastroenterology: Proton Pump Inhibitors Passed - 12/04/2017 11:27 AM      Passed - Valid encounter within last 12 months    Recent Outpatient Visits          4 days ago Routine general medical examination at a health care facility   [facility], [place], MD   3 months ago Gastroesophageal reflux disease, esophagitis presence not specified   [facility], Amil Amen, FNP   1 year ago Dysuria   [facility], Lyn Records, FNP   1 year ago Routine general medical examination at a health care facility   [facility], Westley Hummer, MD   1 year ago Nonintractable headache, unspecified chronicity pattern, unspecified headache type   [facility], MD      Future Appointments            In 1 month Dale Sac, MD [facility] [place], [facility]

## 2017-12-04 NOTE — Telephone Encounter (Signed)
Patient would like to see the cardiologist that she has seen previously. Previously followed by cardiology at [facility].

## 2017-12-04 NOTE — Telephone Encounter (Signed)
Is there a specific cardiologist she should be following with?

## 2017-12-04 NOTE — Telephone Encounter (Signed)
Pt has seen Dr Gwen Pounds previously.  I had sent a referral.  Please note she wants to see Dr Gwen Pounds.  Thanks

## 2017-12-04 NOTE — Telephone Encounter (Signed)
Copied from CRM [id number]. Topic: Quick Communication - See Telephone Encounter >> Dec 04, 2017  1:56 PM Jens Som A wrote: CRM for notification. See Telephone encounter for: 12/04/17. Patient is calling to see if Dr Lorin Picket would like her to see her previous cardiologist, or the cardiologist with [facility] that patient was just referred to. Please advise [phone]

## 2017-12-04 NOTE — Telephone Encounter (Signed)
Copied from CRM [id number]. Topic: Quick Communication - See Telephone Encounter >> Dec 04, 2017 10:23 AM [name] wrote: CRM for notification. See Telephone encounter for: 12/04/17. Patient would like a refill on her omeprazole (PRILOSEC) 20 MG capsule medication and have it sent to her preferred mail order pharmacy [facility] [phone].

## 2017-12-05 ENCOUNTER — Other Ambulatory Visit: Payer: Self-pay

## 2017-12-05 MED ORDER — OMEPRAZOLE 20 MG PO CPDR: 20.0000 mg | DELAYED_RELEASE_CAPSULE | Freq: Every day | ORAL | 0 refills | Status: DC

## 2017-12-13 DIAGNOSIS — M1990 Unspecified osteoarthritis, unspecified site: Secondary | ICD-10-CM | POA: Insufficient documentation

## 2017-12-13 DIAGNOSIS — M810 Age-related osteoporosis without current pathological fracture: Secondary | ICD-10-CM | POA: Insufficient documentation

## 2018-01-03 ENCOUNTER — Ambulatory Visit (INDEPENDENT_AMBULATORY_CARE_PROVIDER_SITE_OTHER): Payer: Medicare Other | Admitting: Internal Medicine

## 2018-01-03 DIAGNOSIS — R29898 Other symptoms and signs involving the musculoskeletal system: Secondary | ICD-10-CM | POA: Diagnosis not present

## 2018-01-03 DIAGNOSIS — K219 Gastro-esophageal reflux disease without esophagitis: Secondary | ICD-10-CM | POA: Diagnosis not present

## 2018-01-03 DIAGNOSIS — E559 Vitamin D deficiency, unspecified: Secondary | ICD-10-CM

## 2018-01-03 DIAGNOSIS — N3941 Urge incontinence: Secondary | ICD-10-CM

## 2018-01-03 DIAGNOSIS — R208 Other disturbances of skin sensation: Secondary | ICD-10-CM

## 2018-01-03 DIAGNOSIS — E785 Hyperlipidemia, unspecified: Secondary | ICD-10-CM | POA: Diagnosis not present

## 2018-01-03 DIAGNOSIS — K2270 Barrett's esophagus without dysplasia: Secondary | ICD-10-CM | POA: Diagnosis not present

## 2018-01-03 NOTE — Progress Notes (Signed)
Patient ID: Carrie Noble, female   DOB: 11-07-1940, 77 y.o.   MRN: [id number]   Subjective:    Patient ID: Carrie Noble, female    DOB: 10/14/1940, 77 y.o.   MRN: [id number]  HPI  Patient here for a scheduled follow up.  She reports she continues to have issues with her face burning.  No redness.  Do not appreciate a rash today.  She was questioning if could be some form of allergy.  She has been using moisturizer on her skin.  Not sure if any better when off.  Request dermatology evaluation.  She also reports legs are stiff.  Do not feel as strong.  Stiffness when walks.  Left foot sore.  Also planning to see urology.  Previously saw them for incontinence and was placed on mrybetriq.   Is having some persistent urinary issues.  Feels "vaginal/bladder muscles weak".  Due to see urology 02/01/17.  Discussed pelvic floor therapy.  No chest pain.  Does have some discomfort under her breast.  Saw Dr Troy Sine.  Note reviewed.  Felt did not need further testing at this time. On omeprazole.     Past Medical History:  Diagnosis Date  . Allergy   . Arthritis   . Gastroesophageal reflux   . Hiatal hernia 1989   status post Nissen fundoplication   . Hx of hysterectomy   . Hyperlipidemia   . Tachycardia    Patient stated that this has been occuring frequently.  . Thyroid disease    Goiter   Past Surgical History:  Procedure Laterality Date  . ABDOMINAL HYSTERECTOMY  1980   partial  . BREAST BIOPSY Right    neg  . BREAST EXCISIONAL BIOPSY Left 2014   neg  . COLONOSCOPY  2015  . ESOPHAGOGASTRIC FUNDOPLICATION  1999  . LAPAROSCOPIC NISSEN FUNDOPLICATION  1999  . TONSILLECTOMY     as well as goiter  . UPPER GI ENDOSCOPY  2015   Family History  Problem Relation Age of Onset  . Stroke Mother 6  . Arthritis Mother   . Heart disease Mother   . Hypertension Mother   . Stroke Father 17  . Hypertension Father   . Rheumatic fever Brother        and multiple open heart surgeries  . Diabetes  Brother        type 2  . Stroke Brother   . Other Sister        Coronary Atherosclerosis  . Stroke Brother   . Stroke Sister   . Heart disease Sister   . Dementia Sister   . Cancer Sister        ovarian   Social History   Socioeconomic History  . Marital status: Married    Spouse name: Not on file  . Number of children: Not on file  . Years of education: Not on file  . Highest education level: Not on file  Occupational History  . Occupation: Retired    Associate Professor: OTHER  Social Needs  . Financial resource strain: Not on file  . Food insecurity:    Worry: Not on file    Inability: Not on file  . Transportation needs:    Medical: Not on file    Non-medical: Not on file  Tobacco Use  . Smoking status: Never Smoker  . Smokeless tobacco: Never Used  Substance and Sexual Activity  . Alcohol use: No    Alcohol/week: 0.0 standard drinks  . Drug use: No  .  Sexual activity: Never  Lifestyle  . Physical activity:    Days per week: Not on file    Minutes per session: Not on file  . Stress: Not on file  Relationships  . Social connections:    Talks on phone: Not on file    Gets together: Not on file    Attends religious service: Not on file    Active member of club or organization: Not on file    Attends meetings of clubs or organizations: Not on file    Relationship status: Not on file  Other Topics Concern  . Not on file  Social History Narrative   Occasionally drinks coffee.    Outpatient Encounter Medications as of 01/03/2018  Medication Sig  . Biotin 1000 MCG tablet Take 1,000 mcg by mouth 3 (three) times daily. Reported on 05/13/2015  . Calcium Carbonate (CALCIUM 600 PO) Take by mouth daily. Reported on 05/13/2015  . Cholecalciferol (VITAMIN D PO) Take by mouth daily. Reported on 05/13/2015  . Epinastine HCl 0.05 % ophthalmic solution Place 1 drop into both eyes 2 (two) times daily as needed.  Marland Kitchen. ketotifen (ALAWAY) 0.025 % ophthalmic solution 1 drop 2 (two) times  daily.  . mometasone (NASONEX) 50 MCG/ACT nasal spray 1 SPRAY 1-2 TIMES PER DAY AS NEEDED.  Marland Kitchen. neomycin-polymyxin-dexamethasone (MAXITROL) 0.1 % ophthalmic suspension Place 1 drop into both eyes 2 (two) times daily.  . Olopatadine HCl (PAZEO) 0.7 % SOLN Place 1 drop into both eyes daily.  Marland Kitchen. omeprazole (PRILOSEC) 20 MG capsule Take 1 capsule (20 mg total) by mouth daily.  . rosuvastatin (CRESTOR) 10 MG tablet Take 1 tablet (10 mg total) by mouth daily.  . vitamin B-12 (CYANOCOBALAMIN) 1000 MCG tablet Take 1,000 mcg by mouth daily. Reported on 05/13/2015  . [DISCONTINUED] mirabegron ER (MYRBETRIQ) 25 MG TB24 tablet Take by mouth.   Facility-Administered Encounter Medications as of 01/03/2018  Medication  . betamethasone acetate-betamethasone sodium phosphate (CELESTONE) injection 3 mg  . betamethasone acetate-betamethasone sodium phosphate (CELESTONE) injection 3 mg    Review of Systems  Constitutional: Negative for appetite change and unexpected weight change.  HENT: Negative for congestion and sinus pressure.   Respiratory: Negative for cough, chest tightness and shortness of breath.   Cardiovascular: Negative for chest pain, palpitations and leg swelling.  Gastrointestinal: Negative for abdominal pain, diarrhea, nausea and vomiting.  Genitourinary: Negative for vaginal discharge.       Urinary incontinence.    Musculoskeletal: Negative for joint swelling.       Leg stiffness as outlined.  Left foot pain.    Skin: Negative for color change and rash.  Neurological: Negative for dizziness, light-headedness and headaches.  Psychiatric/Behavioral: Negative for agitation and dysphoric mood.       Objective:    Physical Exam Constitutional:      General: She is not in acute distress.    Appearance: Normal appearance.  HENT:     Nose: Nose normal. No congestion.     Mouth/Throat:     Pharynx: No oropharyngeal exudate or posterior oropharyngeal erythema.  Neck:     Musculoskeletal:  Neck supple. No muscular tenderness.     Thyroid: No thyromegaly.  Cardiovascular:     Rate and Rhythm: Normal rate and regular rhythm.  Pulmonary:     Effort: No respiratory distress.     Breath sounds: Normal breath sounds. No wheezing.  Abdominal:     General: Bowel sounds are normal.     Palpations: Abdomen is soft.  Tenderness: There is no abdominal tenderness.  Musculoskeletal:        General: No swelling or tenderness.  Lymphadenopathy:     Cervical: No cervical adenopathy.  Neurological:     Mental Status: She is alert.  Psychiatric:        Mood and Affect: Mood normal.        Thought Content: Thought content normal.     BP 128/76 (BP Location: Left Arm, Patient Position: Sitting, Cuff Size: Normal)   Pulse 87   Temp (!) 97.5 F (36.4 C) (Oral)   Resp 18   Wt 143 lb 3.2 oz (65 kg)   SpO2 98%   BMI 25.37 kg/m  Wt Readings from Last 3 Encounters:  01/03/18 143 lb 3.2 oz (65 kg)  11/30/17 138 lb (62.6 kg)  08/10/17 142 lb (64.4 kg)     Lab Results  Component Value Date   WBC 4.9 11/30/2017   HGB 13.7 11/30/2017   HCT 40.1 11/30/2017   PLT 354.0 11/30/2017   GLUCOSE 103 (H) 11/30/2017   CHOL 266 (H) 11/30/2017   TRIG 125.0 11/30/2017   HDL 60.70 11/30/2017   LDLDIRECT 141.0 09/01/2016   LDLCALC 180 (H) 11/30/2017   ALT 19 11/30/2017   AST 21 11/30/2017   NA 138 11/30/2017   K 4.7 11/30/2017   CL 101 11/30/2017   CREATININE 0.97 11/30/2017   BUN 19 11/30/2017   CO2 31 11/30/2017   TSH 2.15 11/30/2017   HGBA1C 5.7 03/29/2013    Mm 3d Screen Breast Bilateral  Result Date: 11/14/2017 CLINICAL DATA:  Screening. EXAM: DIGITAL SCREENING BILATERAL MAMMOGRAM WITH TOMO AND CAD COMPARISON:  Previous exam(s). ACR Breast Density Category c: The breast tissue is heterogeneously dense, which may obscure small masses. FINDINGS: There are no findings suspicious for malignancy. Images were processed with CAD. IMPRESSION: No mammographic evidence of malignancy. A  result letter of this screening mammogram will be mailed directly to the patient. RECOMMENDATION: Screening mammogram in one year. (Code:SM-B-01Y) BI-RADS CATEGORY  1: Negative. Electronically Signed   By: Bary Richard M.D.   On: 11/14/2017 08:42       Assessment & Plan:   Problem List Items Addressed This Visit    Barrett's esophagus    EGD 11/11/16 as outlined.  On omeprazole.  Saw Dr Troy Sine.  Felt no further w/up warranted.        GERD (gastroesophageal reflux disease)    On omeprazole.  Saw Dr Troy Sine as outlined.        Hyperlipidemia    Low cholesterol diet and exercise.  Follow lipid panel.        Relevant Orders   Hepatic function panel   Lipid panel   Basic metabolic panel   Leg weakness    Left foot pain and leg weakness.  Saw podiatry.  Was given exercises and stretches.  Recent labs unrevealing.  Discussed physical therapy.  Refer.        Relevant Orders   Ambulatory referral to Physical Therapy   Urge incontinence    Has seen gyn and urology.  Trial of myrbetriq.  Has f/ with urology.  Discussed pelvic floor therapy.  Refer.        Relevant Orders   Ambulatory referral to Physical Therapy   Vitamin D deficiency    Follow vitamin D level.         Other Visit Diagnoses    Burning sensation of skin       persistent burning  of face.  request dermatology referral. no rash present.     Relevant Orders   Ambulatory referral to Dermatology       [place] , MD

## 2018-01-08 ENCOUNTER — Telehealth: Payer: Self-pay

## 2018-01-08 NOTE — Telephone Encounter (Signed)
Copied from CRM [id number]. Topic: Quick Communication - See Telephone Encounter >> Jan 08, 2018  9:28 AM Carrie AmatoBurton, Donna F wrote: Pt is needing to talk with the referral department in regards to the appointments that are supposed to be scheduled for physical therapy for her legs and to let you know that she has an  Appt with Dr. Adolphus Birchwoodasher tomorrow and she is hoping to go out of town this week that is why she needs the name of the provider for therapy to either schedule herself or know when the appt has been made   Best number  [phone]

## 2018-01-09 ENCOUNTER — Encounter: Payer: Self-pay | Admitting: Internal Medicine

## 2018-01-09 NOTE — Assessment & Plan Note (Signed)
Left foot pain and leg weakness.  Saw podiatry.  Was given exercises and stretches.  Recent labs unrevealing.  Discussed physical therapy.  Refer.

## 2018-01-09 NOTE — Telephone Encounter (Signed)
Patient has referral placed for PT and pelvic floor exercises and is wondering who she will be seeing. I wasn't sure where referral was being sent.

## 2018-01-09 NOTE — Assessment & Plan Note (Signed)
EGD 11/11/16 as outlined.  On omeprazole.  Saw Dr Troy SineMedhoff.  Felt no further w/up warranted.

## 2018-01-09 NOTE — Assessment & Plan Note (Signed)
Has seen gyn and urology.  Trial of myrbetriq.  Has f/ with urology.  Discussed pelvic floor therapy.  Refer.

## 2018-01-09 NOTE — Assessment & Plan Note (Signed)
Follow vitamin D level.  

## 2018-01-09 NOTE — Assessment & Plan Note (Signed)
Low cholesterol diet and exercise.  Follow lipid panel.   

## 2018-01-09 NOTE — Assessment & Plan Note (Signed)
On omeprazole.  Saw Dr Troy SineMedhoff as outlined.

## 2018-01-11 NOTE — Telephone Encounter (Signed)
She is seeing Shin-Ying and is scheduled on 12/30

## 2018-01-15 ENCOUNTER — Other Ambulatory Visit: Payer: Self-pay | Admitting: Internal Medicine

## 2018-01-15 ENCOUNTER — Other Ambulatory Visit (INDEPENDENT_AMBULATORY_CARE_PROVIDER_SITE_OTHER): Payer: Medicare Other

## 2018-01-15 DIAGNOSIS — E785 Hyperlipidemia, unspecified: Secondary | ICD-10-CM | POA: Diagnosis not present

## 2018-01-15 DIAGNOSIS — R300 Dysuria: Secondary | ICD-10-CM | POA: Diagnosis not present

## 2018-01-15 LAB — URINALYSIS, ROUTINE W REFLEX MICROSCOPIC
Bilirubin Urine: NEGATIVE
Hgb urine dipstick: NEGATIVE
Ketones, ur: NEGATIVE
Leukocytes, UA: NEGATIVE
Nitrite: NEGATIVE
Specific Gravity, Urine: 1.025 (ref 1.000–1.030)
Total Protein, Urine: NEGATIVE
Urine Glucose: NEGATIVE
Urobilinogen, UA: 0.2 (ref 0.0–1.0)
pH: 5.5 (ref 5.0–8.0)

## 2018-01-15 LAB — HEPATIC FUNCTION PANEL
ALBUMIN: 4.5 g/dL (ref 3.5–5.2)
ALT: 16 U/L (ref 0–35)
AST: 18 U/L (ref 0–37)
Alkaline Phosphatase: 111 U/L (ref 39–117)
Bilirubin, Direct: 0.1 mg/dL (ref 0.0–0.3)
Total Bilirubin: 0.5 mg/dL (ref 0.2–1.2)
Total Protein: 7.3 g/dL (ref 6.0–8.3)

## 2018-01-15 LAB — LIPID PANEL
Cholesterol: 173 mg/dL (ref 0–200)
HDL: 59.4 mg/dL (ref >39.00)
LDL CALC: 94 mg/dL (ref 0–99)
NonHDL: 113.87
Total CHOL/HDL Ratio: 3
Triglycerides: 101 mg/dL (ref 0.0–149.0)
VLDL: 20.2 mg/dL (ref 0.0–40.0)

## 2018-01-15 LAB — BASIC METABOLIC PANEL
BUN: 19 mg/dL (ref 6–23)
CHLORIDE: 102 meq/L (ref 96–112)
CO2: 28 mEq/L (ref 19–32)
Calcium: 9.3 mg/dL (ref 8.4–10.5)
Creatinine, Ser: 1.07 mg/dL (ref 0.40–1.20)
GFR: 52.62 mL/min — AB (ref >60.00)
Glucose, Bld: 105 mg/dL — ABNORMAL HIGH (ref 70–99)
POTASSIUM: 3.8 meq/L (ref 3.5–5.1)
SODIUM: 137 meq/L (ref 135–145)

## 2018-01-15 NOTE — Progress Notes (Signed)
Order placed for urine and urine culture.

## 2018-01-16 LAB — URINE CULTURE
MICRO NUMBER:: 91533343
Result:: NO GROWTH
SPECIMEN QUALITY:: ADEQUATE

## 2018-01-18 ENCOUNTER — Other Ambulatory Visit: Payer: Self-pay | Admitting: Internal Medicine

## 2018-01-18 DIAGNOSIS — R944 Abnormal results of kidney function studies: Secondary | ICD-10-CM

## 2018-01-18 NOTE — Progress Notes (Signed)
Order placed for f/u met b.  

## 2018-01-22 ENCOUNTER — Ambulatory Visit: Payer: Self-pay | Admitting: *Deleted

## 2018-01-22 ENCOUNTER — Ambulatory Visit: Payer: Medicare Other | Attending: Internal Medicine | Admitting: Physical Therapy

## 2018-01-22 ENCOUNTER — Other Ambulatory Visit: Payer: Self-pay

## 2018-01-22 VITALS — BP 120/78

## 2018-01-22 DIAGNOSIS — R278 Other lack of coordination: Secondary | ICD-10-CM | POA: Insufficient documentation

## 2018-01-22 DIAGNOSIS — M6281 Muscle weakness (generalized): Secondary | ICD-10-CM | POA: Diagnosis present

## 2018-01-22 DIAGNOSIS — M62838 Other muscle spasm: Secondary | ICD-10-CM | POA: Insufficient documentation

## 2018-01-22 NOTE — Patient Instructions (Signed)
Sleeping on side with pillow between knees   ___ Avoid straining pelvic floor, abdominal muscles , spine  Use log rolling technique instead of getting out of bed with your neck or the sit-up     Log rolling into and out of bed If getting out of bed on L side, 1) Bent knees, scoot hips/ shoulder to R   Raise L arm completely overhead, rolling onto armpit  Then lower bent knees to bed to get into complete side lying position  Then drop legs off bed, and push up onto L elbow/forearm, and use R hand to push onto the bend   ____  Avoid cross thighs when sitting  Feet on the floor under knees  ____  Wear shoe lift in L shoe   ____ Call Dr. Lorin PicketScott and inform her about your medications

## 2018-01-22 NOTE — Telephone Encounter (Signed)
Pt reports palpitations, onset last night. States sister died last night. Reports "Few" last night, duration seconds.  States has had 2 today, lasting seconds. Denies nausea, diaphoresis, CP; no chest tightness, no SOB or dizziness. Reports at P.T. today BP "Good" 120/70.  States had not eaten and "Probably not hydrated well" since news of her sister's death. Initially called to get message to Dr. Lorin PicketScott that she had stopped taking her Crestor due to intermittent pain left wrist area, onset 2 days ago. Pt states pain does not radiate, has not worsened. TN expressed concern re: any left arm pain. Recommended UC/ED. Pt states she will go if symptoms reoccur, worsen in duration, frequency.  States she feels fine now and will wait. Pt also wanted Dr. Lorin PicketScott to know she is taking Vit. D3 50mg  and is questioning if that is too much, possibly causing wrist pain. Care advise given, reiterated need to go to UC/ED if symptoms worsen.  Reason for Disposition . [1] Palpitations AND [2] no improvement after using CARE ADVICE  Answer Assessment - Initial Assessment Questions 1. DESCRIPTION: "Please describe your heart rate or heart beat that you are having" (e.g., fast/slow, regular/irregular, skipped or extra beats, "palpitations")     Palpitations 2. ONSET: "When did it start?" (Minutes, hours or days)      Last night; has had recently... months ago, "Not often" couple times a day, not every day. 3. DURATION: "How long does it last" (e.g., seconds, minutes, hours)     seconds 4. PATTERN "Does it come and go, or has it been constant since it started?"  "Does it get worse with exertion?"   "Are you feeling it now?"    Started last night, 2 times today 5. TAP: "Using your hand, can you tap out what you are feeling on a chair or table in front of you, so that I can hear?" (Note: not all patients can do this)       no 6. HEART RATE: "Can you tell me your heart rate?" "How many beats in 15 seconds?"  (Note: not all  patients can do this)       No; OK at rehab today 7. RECURRENT SYMPTOM: "Have you ever had this before?" If so, ask: "When was the last time?" and "What happened that time?"      Months ago. 8. CAUSE: "What do you think is causing the palpitations?"     Stress 9. CARDIAC HISTORY: "Do you have any history of heart disease?" (e.g., heart attack, angina, bypass surgery, angioplasty, arrhythmia)      10. OTHER SYMPTOMS: "Do you have any other symptoms?" (e.g., dizziness, chest pain, sweating, difficulty breathing)      no  Protocols used: HEART RATE AND HEARTBEAT QUESTIONS-A-AH

## 2018-01-23 NOTE — Therapy (Addendum)
[facility] MAIN [facility] SERVICES [address] [place], [place], [address] Phone: [phone]   Fax:  [phone]  Physical Therapy Evaluation  Patient Details  Name: Carrie Noble MRN: [id number] Date of Birth: August 10, 1940 Referring Provider (PT): Dale Fidelis, MD    Encounter Date: 01/22/2018    Past Medical History:  Diagnosis Date  . Allergy   . Arthritis   . Gastroesophageal reflux   . Hiatal hernia 1989   status post Nissen fundoplication   . Hx of hysterectomy   . Hyperlipidemia   . Tachycardia    Patient stated that this has been occuring frequently.  . Thyroid disease    Goiter    Past Surgical History:  Procedure Laterality Date  . ABDOMINAL HYSTERECTOMY  1980   partial  . BREAST BIOPSY Right    neg  . BREAST EXCISIONAL BIOPSY Left 2014   neg  . COLONOSCOPY  2015  . ESOPHAGOGASTRIC FUNDOPLICATION  1999  . LAPAROSCOPIC NISSEN FUNDOPLICATION  1999  . TONSILLECTOMY     as well as goiter  . UPPER GI ENDOSCOPY  2015    Vitals:   01/22/18 1428  BP: 120/78     Subjective Assessment - 01/29/18 1641    Subjective 1) Pt has seen a urologist for "not being able to hold her water".  Pt has tried Mybetric but it has not helped.  Pt has leakage occuring when she has to go the bathroom and can not make it in time before leakage and with sit to stand. Nocturia: 3x / night. These are issues that has occured for a couple of years. Pt was tested for OSA a couple of years ago. Pt does not think she snores that much. Denied leakage with sneezing, coughing, laughter, difficulty with initiating urinating, straining with urination. Pt notices she has the urge and leakage when she puts her key in the door. Wears 1-2 pads / day,  1x night.  Denied LBP. Bowel movements occur every other day and is eating more prunes and oranges. Seldomly straining with bowel movements.  Gynceological: 2 vaginal deliveries with hysterecomy with ovaries in tact,  fundoplication for indigestion but it did not help. Pt has been informed her fundoplication surgery has come loose but she is not going back to recorrect.   Hx of cystitis with procedure to stretch her bladder late 77s.       2) B ankle and feet pain:   R plantar fascittis.   Hx of L ankle Fx with swelling.  Pt has had shots for the plantar fasciitis.  Walking and shopping 1-2 hours will flareup plantar fascitiis. L ankle Fx was due to side stepping and ankle injury.  Pt has not had any recent falls in 6months.  Stressors:  sister passing yesterday, every day things.  Ways to relax: walking 2-3 miles but recently with her leg weakness and feet/ankle issues, pt has not been walking. [place] [employer] and intend to go use the exercise bike.     * Pt started having pains in the L arm intermittently a couple of days ago and stopped taking Crestor but pt has not informed her provider yet.    Pertinent History     Patient Stated Goals  be normal and be able to go when she has the urge and not wet her pants before making it the bathroom           [facility] PT  Assessment - 01/29/18 1629      Assessment   Medical Diagnosis  mixed incontinence, weakness of BLE     Referring Provider (PT)  Dale Durhamharlene Scott, MD       Precautions   Precautions  None      Restrictions   Weight Bearing Restrictions  No      Balance Screen   Has the patient fallen in the past 6 months  No      Prior Function   Level of Independence  Independent      Coordination   Gross Motor Movements are Fluid and Coordinated  --   chest breathing, diaphragm expansion present   Fine Motor Movements are Fluid and Coordinated  --   pelvic floor contraction via ab straining     Strength   Overall Strength  --   hip ext B 3/5, hip abd L 4-/5      Palpation   SI assessment   R iliac crest slightly higher, R lumbar convex cave in standing, supine with R ASIS more anterior     87 cm R , 86 cm L leg length  supine      Bed Mobility   Bed Mobility  --   crunches               Objective measurements completed on examination: See above findings.    Pelvic Floor Special Questions - 01/29/18 1631    Diastasis Recti  neg     External Perineal Exam  no mm tensions/ nor tightness       [facility] Adult PT Treatment/Exercise - 01/29/18 1629      Therapeutic Activites    Therapeutic Activities  --   shoe lift provided     Neuro Re-ed    Neuro Re-ed Details   cues for less straining of spine, pelvic floor ( see pt isntructions)                   PT Long Term Goals - 01/29/18 1626      PT LONG TERM GOAL #1   Title  Pt will decrease her PFDI score from 23% to < 11% in order to improve pelvic floor function    Time  12    Period  Weeks    Status  New      PT LONG TERM GOAL #2   Title  Pt will decrease her ODI score from 28% to < 14% in order to improve ADLs     Time  10    Period  Weeks    Status  New      PT LONG TERM GOAL #3   Title  Pt will demo equal pelvic alignment across 2 weeks in order to progress with deep core coordination and strengthening exercises     Time  2    Period  Weeks      PT LONG TERM GOAL #4   Title  Pt will be IND with scoliosis specific HEP in order to minimize BLE problems, improve balance, knee and feet issues     Time  10    Period  Weeks    Status  New      PT LONG TERM GOAL #5   Title  Pt will be able to shop for 1-2 hours without flare up of plant fascititis  in order to participate in the com munity     Time  12    Period  Weeks  Status  New      Additional Long Term Goals   Additional Long Term Goals  Yes      PT LONG TERM GOAL #6   Title  Pt will report no longer having the urge when putting her key in the door and being able to make it to the toilet before leakage in order to improve QOL     Time  4    Period  Weeks    Status  New      PT LONG TERM GOAL #7   Title  Pt will report ed decreased nocturia from 3 x  night to < 2 x night in order to improve QOL and be referred to sleep study to screen in/out OSA if nocturia persists.     Time  8    Period  Weeks    Status  New    Target Date  03/26/18             Plan - 01/29/18 1631    Clinical Impression Statement  Pt is a 77 yo female who reports of urge incontinence and nocturia in addition to B ankle and feet pain. These deficits impact her QOL and ADLs. Pt's clinical presentations include leg length difference, pelvic obliquities, lumbar scoliotic curve, hip/LE weakness, poor posture and body mechanics which places strain on her pelvic floor. Following today's session, pt was provided a shoe lift in her L shoe which facilitated a more levelled pelvis. Pt demo'd improved proper body mechanics with cuing and training. Plan to address abdominopelvic area at upcoming sessions given her Hx of 2 vaginal deliveries with hysterecomy with ovaries in tact, fundoplication procedure, and Hx of cystitis with procedure to stretch her bladder late 78s.  Pt would benefit from a regional interdependent approach to yield greater outcomes for her dysfunctions.    Rehab Potential  Good    PT Frequency  1x / week    PT Duration  12 weeks    PT Treatment/Interventions  Moist Heat;Therapeutic activities;Therapeutic exercise;Patient/family education;Neuromuscular re-education;Gait training;Electrical Stimulation;Balance training;Scar mobilization;Manual techniques;Functional mobility training;Energy conservation    Consulted and Agree with Plan of Care  Patient       Patient will benefit from skilled therapeutic intervention in order to improve the following deficits and impairments:  Abnormal gait, Improper body mechanics, Pain, Increased muscle spasms, Decreased scar mobility, Decreased coordination, Decreased mobility, Decreased strength, Decreased endurance, Decreased safety awareness, Hypomobility, Postural dysfunction, Difficulty walking, Decreased  balance  Visit Diagnosis: Other muscle spasm  Other lack of coordination  Muscle weakness (generalized)     Problem List Patient Active Problem List   Diagnosis Date Noted  . Chest pain 12/03/2017  . Vitamin D deficiency 11/30/2017  . Leg weakness 11/30/2017  . Stress 09/04/2016  . Urge incontinence 09/04/2016  . Headache 02/16/2016  . Dry eyes 02/16/2016  . Acute bronchitis 11/26/2015  . Memory change 10/11/2015  . Allergic rhinitis 05/06/2015  . Breast tenderness in female 09/14/2014  . Pain in right hip 07/14/2014  . Carotid bruit 07/14/2014  . Difficulty sleeping 03/30/2014  . Health care maintenance 03/30/2014  . Diverticulosis 03/27/2013  . 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 Abnormal mammogram 09/06/2012  . Osteoporosis 05/29/2012  . Thyroid goiter 05/28/2012  . Barrett's esophagus 05/28/2012  . Hyperlipidemia 01/03/2011  . Palpitations 01/03/2011  . GERD (gastroesophageal reflux disease) 01/03/2011    Mariane Masters ,PT, DPT, E-RYT  01/29/2018, 4:41 PM  [facility] MAIN  [facility] [address] [place], [place], [address] Phone: [phone]   Fax:  [phone]  Name: Gilmer MorLinda W Kellett MRN: [id number] Date of Birth: [birth year]

## 2018-01-23 NOTE — Telephone Encounter (Signed)
FYI

## 2018-01-23 NOTE — Telephone Encounter (Signed)
Called patient left voicemail to call office .PEC nurse may advise patient PCP advised she should be evaluated

## 2018-01-23 NOTE — Telephone Encounter (Signed)
Given palpitations, etc, needs to be evaluated.  Would recommend evaluation today.

## 2018-01-23 NOTE — Telephone Encounter (Signed)
I have attempted to reach pt on home number. No answer & no voicemail. This message was routed to a CMA box that is not in the office today.

## 2018-01-26 NOTE — Telephone Encounter (Signed)
Patient is returning call- she reports she is feeling better- the palpations have gone away- she feels she was under stress due to the loss of her sister.   Patient does wants to see if she can decrease the Crestor to half dose- she is not taking it presently( 3 days now) She had such a good response and she wants to know if Dr Lorin Picket feels that the half dose will keep her numbers stable. Message can be left on her VM. [phone].   Patient has been going to her therapy.  Patient has already seen Dr Adolphus Birchwood- for her skin- she has canceled the referral.

## 2018-01-26 NOTE — Telephone Encounter (Signed)
Can try a lower dose of crestor and see if tolerates.  Let us know if persistent problems.  Also, let us know if needs anything more.  Thanks.

## 2018-01-26 NOTE — Telephone Encounter (Signed)
LMTCB

## 2018-01-29 ENCOUNTER — Ambulatory Visit (INDEPENDENT_AMBULATORY_CARE_PROVIDER_SITE_OTHER): Payer: Medicare Other | Admitting: Family Medicine

## 2018-01-29 ENCOUNTER — Encounter: Payer: Self-pay | Admitting: Family Medicine

## 2018-01-29 VITALS — BP 132/78 | HR 66 | Temp 97.9°F | Ht 63.0 in | Wt 140.8 lb

## 2018-01-29 DIAGNOSIS — R05 Cough: Secondary | ICD-10-CM

## 2018-01-29 DIAGNOSIS — R0989 Other specified symptoms and signs involving the circulatory and respiratory systems: Secondary | ICD-10-CM

## 2018-01-29 DIAGNOSIS — R0982 Postnasal drip: Secondary | ICD-10-CM

## 2018-01-29 DIAGNOSIS — R490 Dysphonia: Secondary | ICD-10-CM

## 2018-01-29 DIAGNOSIS — R059 Cough, unspecified: Secondary | ICD-10-CM

## 2018-01-29 MED ORDER — AZITHROMYCIN 250 MG PO TABS: ORAL_TABLET | ORAL | 0 refills | Status: DC

## 2018-01-29 MED ORDER — METHYLPREDNISOLONE 4 MG PO TBPK: ORAL_TABLET | ORAL | 0 refills | Status: DC

## 2018-01-29 NOTE — Progress Notes (Signed)
Subjective:    Patient ID: Carrie Noble, female    DOB: [birth year], 78 y.o.   MRN: [id number]  HPI   Patient presents to clinic complaining of hoarse voice, nasal and chest congestion, cough for past 8 to 10 days.  Patient has tried over-the-counter cold medicines, salt water gargles, drinking warm liquids, but symptoms do not seem to be improving.  Denies any feelings of shortness of breath or wheezing, states she just feels like she has phlegm and she cannot get it up with coughing.  Patient does state she used some over-the-counter Robitussin cough syrup, which did seem to be helpful to her cough yesterday, she did use this again this morning and it continues to help cough.  Denies fever or chills, denies chest pain, denies nausea/vomiting/diarrhea.  Patient Active Problem List   Diagnosis Date Noted  . Chest pain 12/03/2017  . Vitamin D deficiency 11/30/2017  . Leg weakness 11/30/2017  . Stress 09/04/2016  . Urge incontinence 09/04/2016  . Headache 02/16/2016  . Dry eyes 02/16/2016  . Acute bronchitis 11/26/2015  . Memory change 10/11/2015  . Allergic rhinitis 05/06/2015  . Breast tenderness in female 09/14/2014  . Pain in right hip 07/14/2014  . Carotid bruit 07/14/2014  . Difficulty sleeping 03/30/2014  . Health care maintenance 03/30/2014  . Diverticulosis 03/27/2013  . 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 Abnormal mammogram 09/06/2012  . Osteoporosis 05/29/2012  . Thyroid goiter 05/28/2012  . Barrett's esophagus 05/28/2012  . Hyperlipidemia 01/03/2011  . Palpitations 01/03/2011  . GERD (gastroesophageal reflux disease) 01/03/2011   Social History   Tobacco Use  . Smoking status: Never Smoker  . Smokeless tobacco: Never Used  Substance Use Topics  . Alcohol use: No    Alcohol/week: 0.0 standard drinks   Review of Systems  Constitutional: Negative for chills, fatigue and fever.  HENT: +nasal congestion, drainage, hoarse voice. Eyes:  Negative.   Respiratory: +cough. Negative for shortness of breath and wheezing.   Cardiovascular: Negative for chest pain, palpitations and leg swelling.  Gastrointestinal: Negative for abdominal pain, diarrhea, nausea and vomiting.  Genitourinary: Negative for dysuria, frequency and urgency.  Musculoskeletal: Negative for arthralgias and myalgias.  Skin: Negative for color change, pallor and rash.  Neurological: Negative for syncope, light-headedness and headaches.  Psychiatric/Behavioral: The patient is not nervous/anxious.       Objective:   Physical Exam Vitals signs and nursing note reviewed.  Constitutional:      Appearance: Normal appearance. She is not toxic-appearing.  HENT:     Head: Normocephalic and atraumatic.     Nose: Congestion and rhinorrhea present.     Mouth/Throat:     Pharynx: No oropharyngeal exudate.     Comments: +post nasal drip Eyes:     General: No scleral icterus.    Extraocular Movements: Extraocular movements intact.     Conjunctiva/sclera: Conjunctivae normal.  Neck:     Musculoskeletal: Neck supple. No neck rigidity.  Cardiovascular:     Rate and Rhythm: Normal rate and regular rhythm.  Pulmonary:     Effort: Pulmonary effort is normal. No respiratory distress.     Breath sounds: No wheezing, rhonchi or rales.     Comments: +wet raspy cough Skin:    General: Skin is warm and dry.     Coloration: Skin is not pale.  Neurological:     Mental Status: She is alert and oriented to person, place, and time.  Psychiatric:  Mood and Affect: Mood normal.        Behavior: Behavior normal.       Vitals:   01/29/18 1435  BP: 132/78  Pulse: 66  Temp: 97.9 F (36.6 C)  SpO2: 97%   Assessment & Plan:   Chest congestion, cough, postnasal drip, hoarse voice - patient will take prednisone taper to help improve hoarse voice and chest congestion.  Advised she can continue to use the Robitussin cough syrup as it has been helpful to improve cough.   Due to length of time with symptoms, we will cover with Z-Pak to treat any respiratory infection.  Offered chest x-ray in clinic today, but patient declines.  Advised to rest, increase fluid intake, do good handwashing.  Patient will keep regularly scheduled follow-up with PCP as planned.  Advised to return to clinic sooner if issues arise, or current symptoms persist or worsen.

## 2018-01-29 NOTE — Addendum Note (Signed)
Addended by: Mariane Masters on: 01/29/2018 04:57 PM   Modules accepted: Orders

## 2018-01-31 ENCOUNTER — Ambulatory Visit: Payer: Medicare Other | Attending: Internal Medicine | Admitting: Physical Therapy

## 2018-01-31 DIAGNOSIS — M62838 Other muscle spasm: Secondary | ICD-10-CM

## 2018-01-31 DIAGNOSIS — M6281 Muscle weakness (generalized): Secondary | ICD-10-CM

## 2018-01-31 DIAGNOSIS — R278 Other lack of coordination: Secondary | ICD-10-CM | POA: Diagnosis present

## 2018-01-31 DIAGNOSIS — R2689 Other abnormalities of gait and mobility: Secondary | ICD-10-CM | POA: Diagnosis not present

## 2018-01-31 NOTE — Patient Instructions (Addendum)
Good job with decreasing coffee  Add one cup of room temp water first thing in the morning and then mid morning, mid afternoon   _____  Feet care :  Self -feet massage   Handshake : fingers between toes, moving ballmounds/toes back and forth several times while other hand anchors at arch. Do the same at the hind/mid foot.  Heel to toes upward to a letter Big Letter T strokes to spread ballmounds and toes, several times, pinch between webs of toes  Run finger tips along top of foot between long bones "comb between the bones"    Wiggle toes and spread them out when relaxing    ______  Strengthening L ankle  1) Ankle strengthening on L with band band wrapped around outer L side of foot ballmound of L foot pressing onto band , R foot is placed on top of band hip width apart, with the ballmound ,  R hand holds the band 30 reps swinging L pinky toe out to the L  X 2x day ______    2) Gas pedal pump , press ballmound down, toes spread  30 reps     ------  Start back walking  in the house hallways 6 mins x 2-3 x day . Paths are clear, not throw rugs  Higher thighs and more ballmound, toes spread when lowering foot

## 2018-01-31 NOTE — Therapy (Signed)
[facility] MAIN [facility] SERVICES [address] [place], [place], [address] Phone: [phone]   Fax:  [phone]  Physical Therapy Treatment  Patient Details  Name: Carrie Noble MRN: [id number] Date of Birth: 09-20-1940 Referring Provider (PT): Dale Bayou Corne, MD    Encounter Date: 01/31/2018  PT End of Session - 01/31/18 1157    Visit Number  2    Number of Visits  12    Date for PT Re-Evaluation  04/16/18    PT Start Time  1115    PT Stop Time  1157    PT Time Calculation (min)  42 min       Past Medical History:  Diagnosis Date  . Allergy   . Arthritis   . Gastroesophageal reflux   . Hiatal hernia 1989   status post Nissen fundoplication   . Hx of hysterectomy   . Hyperlipidemia   . Tachycardia    Patient stated that this has been occuring frequently.  . Thyroid disease    Goiter    Past Surgical History:  Procedure Laterality Date  . ABDOMINAL HYSTERECTOMY  1980   partial  . BREAST BIOPSY Right    neg  . BREAST EXCISIONAL BIOPSY Left 2014   neg  . COLONOSCOPY  2015  . ESOPHAGOGASTRIC FUNDOPLICATION  1999  . LAPAROSCOPIC NISSEN FUNDOPLICATION  1999  . TONSILLECTOMY     as well as goiter  . UPPER GI ENDOSCOPY  2015    There were no vitals filed for this visit.  Subjective Assessment - 01/31/18 1117    Subjective  Pt has cut back on cofee and teas but has not been drinking more water. Pt has noticed she has going to the toilet less in the the day and night    Patient Stated Goals  be normal and be able to go when she has the urge and not wet her pants before making it the bathroom           [facility] PT Assessment - 01/31/18 1258      Palpation   Palpation comment  increased tightness on R extensor retinaculum, L adductor hallucis- transverse/ oblique heads , mild hypomobility at midfoot joints , lacking eversion      Bed Mobility   Bed Mobility  --   good carry over, no crunching      Ambulation/Gait   Gait  Comments  excessive toe flexion on R> L, lack knee flexion and hip flexion B,  heel striking ( improved gait after manual Tx and neuromuscular cues )                    [facility] Adult PT Treatment/Exercise - 01/31/18 1200      Manual Therapy   Manual therapy comments  STM/ Grade II mob at midfoot joint to facilitate ankle DF/EV B                   PT Long Term Goals - 01/29/18 1626      PT LONG TERM GOAL #1   Title  Pt will decrease her PFDI score from 23% to < 11% in order to improve pelvic floor function    Time  12    Period  Weeks    Status  New      PT LONG TERM GOAL #2   Title  Pt will decrease her ODI score from 28% to < 14% in order to improve ADLs  Time  10    Period  Weeks    Status  New      PT LONG TERM GOAL #3   Title  Pt will demo equal pelvic alignment across 2 weeks in order to progress with deep core coordination and strengthening exercises     Time  2    Period  Weeks      PT LONG TERM GOAL #4   Title  Pt will be IND with scoliosis specific HEP in order to minimize BLE problems, improve balance, knee and feet issues     Time  10    Period  Weeks    Status  New      PT LONG TERM GOAL #5   Title  Pt will be able to shop for 1-2 hours without flare up of plant fascititis  in order to participate in the com munity     Time  12    Period  Weeks    Status  New      Additional Long Term Goals   Additional Long Term Goals  Yes      PT LONG TERM GOAL #6   Title  Pt will report no longer having the urge when putting her key in the door and being able to make it to the toilet before leakage in order to improve QOL     Time  4    Period  Weeks    Status  New      PT LONG TERM GOAL #7   Title  Pt will report ed decreased nocturia from 3 x night to < 2 x night in order to improve QOL and be referred to sleep study to screen in/out OSA if nocturia persists.     Time  8    Period  Weeks    Status  New    Target Date  03/26/18             Plan - 01/31/18 1300    Clinical Impression Statement Pt is progress well with compliance to decrease bladder irritants and reports noticing less trips to the toilet in the day and middle of the night. Pt showed good carry over with bed motility in bed mechanics and anticipate this will help with less strain on her pelvic floor when getting up in bed with urge. Plan to address pelvic floor at next session to address urge.  Suspect pt's leg length difference also contributes to pt's pelvic issues. Today,  pt showed equal level of pelvis with compliance of shoe lift which was provided at last session. Addressed B ankle/feet pain today to help pt return to walking routine. Applied manual Tx and neuro-muscular re-education to correct L ankle DF/EV and PF/INV weakness and restore midfoot mobility and co-activation of transverse arch. Excessive cues were needed to minimzie toe flexion and more transverse arch co-activation for better balance and less pain. Pt showed improvements post Tx.  Plan to further lower kinetic chain strengtehning and balance training.     Rehab Potential  Good    PT Frequency  1x / week    PT Duration  12 weeks    PT Treatment/Interventions  Moist Heat;Therapeutic activities;Therapeutic exercise;Patient/family education;Neuromuscular re-education;Gait training;Electrical Stimulation;Balance training;Scar mobilization;Manual techniques;Functional mobility training;Energy conservation    Consulted and Agree with Plan of Care  Patient       Patient will benefit from skilled therapeutic intervention in order to improve the following deficits and impairments:  Abnormal gait, Improper body  mechanics, Pain, Increased muscle spasms, Decreased scar mobility, Decreased coordination, Decreased mobility, Decreased strength, Decreased endurance, Decreased safety awareness, Hypomobility, Postural dysfunction, Difficulty walking, Decreased balance  Visit Diagnosis: Other muscle  spasm  Other lack of coordination  Muscle weakness (generalized)     Problem List Patient Active Problem List   Diagnosis Date Noted  . Chest pain 12/03/2017  . Vitamin D deficiency 11/30/2017  . Leg weakness 11/30/2017  . Stress 09/04/2016  . Urge incontinence 09/04/2016  . Headache 02/16/2016  . Dry eyes 02/16/2016  . Acute bronchitis 11/26/2015  . Memory change 10/11/2015  . Allergic rhinitis 05/06/2015  . Breast tenderness in female 09/14/2014  . Pain in right hip 07/14/2014  . Carotid bruit 07/14/2014  . Difficulty sleeping 03/30/2014  . Health care maintenance 03/30/2014  . Diverticulosis 03/27/2013  . 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 Abnormal mammogram 09/06/2012  . Osteoporosis 05/29/2012  . Thyroid goiter 05/28/2012  . Barrett's esophagus 05/28/2012  . Hyperlipidemia 01/03/2011  . Palpitations 01/03/2011  . GERD (gastroesophageal reflux disease) 01/03/2011    Mariane MastersYeung,Shin Yiing ,PT, DPT, E-RYT  01/31/2018, 1:06 PM  [facility] [address] [place], [place], [address] Phone: [phone]   Fax:  [phone]  Name: Gilmer MorLinda W Holzheimer MRN: [id number] Date of Birth: Jul 20, 1940

## 2018-02-06 ENCOUNTER — Ambulatory Visit: Payer: Self-pay

## 2018-02-06 NOTE — Telephone Encounter (Signed)
If poor appetite and persistent cough, needs to be reevaluated.

## 2018-02-06 NOTE — Telephone Encounter (Signed)
LMTCB

## 2018-02-06 NOTE — Telephone Encounter (Signed)
Patient was seen on 01/29/18 and was given z-pak and prednisone taper.

## 2018-02-06 NOTE — Telephone Encounter (Signed)
Out going call to Patient who complains of  Continuing to be  to be congested.  Wonders if she should have another round of antibiotics.She has completed all of RX's.    Doesn't want anymore prednisone.  Patient complains of poor appetite. Patient state she had hydrocodone for cough  Before and that helped.    Request Rx be called into [facility] on [address]. She has no energy. Patient awaits recommendation and call  If possible.   From  Dr. Dale [place].     Reason for Disposition . [1] Finished taking antibiotics AND [2] symptoms are BETTER but [3] not completely gone  Answer Assessment - Initial Assessment Questions 1. INFECTION: "What infection is the antibiotic being given for?"     cough 2. ANTIBIOTIC: "What antibiotic are you taking" "How many times per day?"     tthru with everthing 3. DURATION: "When was the antibiotic started?"     *No Answer* 4. MAIN CONCERN OR SYMPTOM:  "What is your main concern right now?"     congestion 5. BETTER-SAME-WORSE: "Are you getting better, staying the same, or getting worse compared to when you first started the antibiotics?" If getting worse, ask: "In what way?"      Feeling better 6. FEVER: "Do you have a fever?" If so, ask: "What is your temperature, how was it measured, and when did it start?"     denies  Protocols used: INFECTION ON ANTIBIOTIC FOLLOW-UP CALL-A-AH

## 2018-02-07 ENCOUNTER — Telehealth: Payer: Self-pay | Admitting: *Deleted

## 2018-02-07 ENCOUNTER — Ambulatory Visit: Payer: Medicare Other | Admitting: Physical Therapy

## 2018-02-07 ENCOUNTER — Other Ambulatory Visit: Payer: Self-pay | Admitting: Internal Medicine

## 2018-02-07 DIAGNOSIS — M6281 Muscle weakness (generalized): Secondary | ICD-10-CM

## 2018-02-07 DIAGNOSIS — R278 Other lack of coordination: Secondary | ICD-10-CM

## 2018-02-07 DIAGNOSIS — M62838 Other muscle spasm: Secondary | ICD-10-CM

## 2018-02-07 NOTE — Telephone Encounter (Signed)
See other phone note

## 2018-02-07 NOTE — Patient Instructions (Addendum)
Waking up:   warm up the legs:  penguin out and in with feet    seated skiing, feet slides 1 min   During day:  6 min in the morning and evening in hallway    higher thighs, feet wider ,  Land on the ballmounds more , less on heels to minimize plantar fascititis  NOT ON THE OUTER EDGES OF YOUR FEET

## 2018-02-07 NOTE — Telephone Encounter (Signed)
Copied from CRM [id number]. Topic: General - Other >> Feb 07, 2018 10:23 AM Angela NevinWilliams, Candice N wrote: Reason for CRM: Patient calling as FYI to Bethann Berkshirerisha that she is going to try OTC recommendations instead of making an OV. Patient will call back if she feels she needs appointment. See triage note from 01/14.

## 2018-02-07 NOTE — Telephone Encounter (Signed)
Noted  

## 2018-02-09 NOTE — Therapy (Signed)
[facility] [address] [place], [place], [address] Phone: [phone]   Fax:  [phone]  Physical Therapy Treatment  Patient Details  Name: Carrie MorLinda W Noble MRN: [id number] Date of Birth: [birth year] Referring Provider (PT): Dale Durhamharlene Scott, MD    Encounter Date: 02/07/2018  PT End of Session - 02/09/18 0122    Visit Number  3    Number of Visits  12    Date for PT Re-Evaluation  04/16/18    PT Start Time  1304    PT Stop Time  1400    PT Time Calculation (min)  56 min    Activity Tolerance  Patient tolerated treatment well;No increased pain    Behavior During Therapy  WFL for tasks assessed/performed       Past Medical History:  Diagnosis Date  . Allergy   . Arthritis   . Gastroesophageal reflux   . Hiatal hernia 1989   status post Nissen fundoplication   . Hx of hysterectomy   . Hyperlipidemia   . Tachycardia    Patient stated that this has been occuring frequently.  . Thyroid disease    Goiter    Past Surgical History:  Procedure Laterality Date  . ABDOMINAL HYSTERECTOMY  1980   partial  . BREAST BIOPSY Right    neg  . BREAST EXCISIONAL BIOPSY Left 2014   neg  . COLONOSCOPY  2015  . ESOPHAGOGASTRIC FUNDOPLICATION  1999  . LAPAROSCOPIC NISSEN FUNDOPLICATION  1999  . TONSILLECTOMY     as well as goiter  . UPPER GI ENDOSCOPY  2015    There were no vitals filed for this visit.  Subjective Assessment - 02/09/18 0120    Subjective  Pt has cut back on cofee and teas but has not been drinking more water. Pt has noticed she has going to the toilet less in the the day and night    Patient Stated Goals  be normal and be able to go when she has the urge and not wet her pants before making it the bathroom           [facility] - 02/09/18 0126      Palpation   Palpation comment  Tightness of B extensor digitorium mm ( decreased post Tx)       Ambulation/Gait   Gait Comments  lack anterior COM,  heel striking, more carry over with transverse arch coactivation, stronger push off                   [facility] Adult PT Treatment/Exercise - 02/09/18 0126      Neuro Re-ed    Neuro Re-ed Details   see pt instructions, cued for more DF/ EV and less PF INV in closed chain, gait training mechanics for lower kinetic chain, cued for more anterior COM, provided resistance at waist 5' training         Modalities   Modalities  Moist Heat      Moist Heat Therapy   Moist Heat Location  Other (comment)   lateral leg ( unbilled)     Manual Therapy   Manual therapy comments  STM at B extensor digitorium mm B ( decreased post Tx)                   PT Long Term Goals - 01/29/18 1626      PT LONG TERM GOAL #1   Title  Pt will decrease her  PFDI score from 23% to < 11% in order to improve pelvic floor function    Time  12    Period  Weeks    Status  New      PT LONG TERM GOAL #2   Title  Pt will decrease her ODI score from 28% to < 14% in order to improve ADLs     Time  10    Period  Weeks    Status  New      PT LONG TERM GOAL #3   Title  Pt will demo equal pelvic alignment across 2 weeks in order to progress with deep core coordination and strengthening exercises     Time  2    Period  Weeks      PT LONG TERM GOAL #4   Title  Pt will be IND with scoliosis specific HEP in order to minimize BLE problems, improve balance, knee and feet issues     Time  10    Period  Weeks    Status  New      PT LONG TERM GOAL #5   Title  Pt will be able to shop for 1-2 hours without flare up of plant fascititis  in order to participate in the com munity     Time  12    Period  Weeks    Status  New      Additional Long Term Goals   Additional Long Term Goals  Yes      PT LONG TERM GOAL #6   Title  Pt will report no longer having the urge when putting her key in the door and being able to make it to the toilet before leakage in order to improve QOL     Time  4    Period  Weeks     Status  New      PT LONG TERM GOAL #7   Title  Pt will report ed decreased nocturia from 3 x night to < 2 x night in order to improve QOL and be referred to sleep study to screen in/out OSA if nocturia persists.     Time  8    Period  Weeks    Status  New    Target Date  03/26/18            Plan - 02/09/18 0123    Clinical Impression Statement  Pt is compliant with increased water intake and less coffee and tea. Pt reports noticing less frequent trip to the bathroom in the day and night.   Applied regional interdependent approach today with manual Tx to release mm tightness in B extensor digitorium mm and provided excessive cuing to promote optimal transverse arch and increased hip and knee flexion.   Pt continues to benefit from skilled PT to address lower kinetic chain deficits to optimize gait mechanics given L ankle plantar fascitiis and L ankle Fx.         Rehab Potential  Good    PT Frequency  1x / week    PT Duration  12 weeks    PT Treatment/Interventions  Moist Heat;Therapeutic activities;Therapeutic exercise;Patient/family education;Neuromuscular re-education;Gait training;Electrical Stimulation;Balance training;Scar mobilization;Manual techniques;Functional mobility training;Energy conservation    Consulted and Agree with Plan of Care  Patient       Patient will benefit from skilled therapeutic intervention in order to improve the following deficits and impairments:  Abnormal gait, Improper body mechanics, Pain, Increased muscle spasms, Decreased scar mobility, Decreased  coordination, Decreased mobility, Decreased strength, Decreased endurance, Decreased safety awareness, Hypomobility, Postural dysfunction, Difficulty walking, Decreased balance  Visit Diagnosis: Other muscle spasm  Other lack of coordination  Muscle weakness (generalized)     Problem List Patient Active Problem List   Diagnosis Date Noted  . Chest pain 12/03/2017  . Vitamin D  deficiency 11/30/2017  . Leg weakness 11/30/2017  . Stress 09/04/2016  . Urge incontinence 09/04/2016  . Headache 02/16/2016  . Dry eyes 02/16/2016  . Acute bronchitis 11/26/2015  . Memory change 10/11/2015  . Allergic rhinitis 05/06/2015  . Breast tenderness in female 09/14/2014  . Pain in right hip 07/14/2014  . Carotid bruit 07/14/2014  . Difficulty sleeping 03/30/2014  . Health care maintenance 03/30/2014  . Diverticulosis 03/27/2013  . 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 Abnormal mammogram 09/06/2012  . Osteoporosis 05/29/2012  . Thyroid goiter 05/28/2012  . Barrett's esophagus 05/28/2012  . Hyperlipidemia 01/03/2011  . Palpitations 01/03/2011  . GERD (gastroesophageal reflux disease) 01/03/2011    Mariane Masters ,PT, DPT, E-RYT  02/09/2018, 1:27 AM  [facility] MAIN [facility] SERVICES [address] [place], [place], [address] Phone: [phone]   Fax:  [phone]  Name: JALIN SCHAB MRN: [id number] Date of Birth: Mar 30, 1940

## 2018-02-16 ENCOUNTER — Other Ambulatory Visit (INDEPENDENT_AMBULATORY_CARE_PROVIDER_SITE_OTHER): Payer: Medicare Other

## 2018-02-16 DIAGNOSIS — R944 Abnormal results of kidney function studies: Secondary | ICD-10-CM | POA: Diagnosis not present

## 2018-02-16 LAB — BASIC METABOLIC PANEL
BUN: 15 mg/dL (ref 6–23)
CALCIUM: 9.5 mg/dL (ref 8.4–10.5)
CO2: 28 meq/L (ref 19–32)
Chloride: 105 mEq/L (ref 96–112)
Creatinine, Ser: 1 mg/dL (ref 0.40–1.20)
GFR: 53.52 mL/min — ABNORMAL LOW (ref >60.00)
Glucose, Bld: 122 mg/dL — ABNORMAL HIGH (ref 70–99)
Potassium: 4.9 mEq/L (ref 3.5–5.1)
Sodium: 140 mEq/L (ref 135–145)

## 2018-02-20 ENCOUNTER — Ambulatory Visit: Payer: Medicare Other | Admitting: Physical Therapy

## 2018-02-20 DIAGNOSIS — M6281 Muscle weakness (generalized): Secondary | ICD-10-CM

## 2018-02-20 DIAGNOSIS — M62838 Other muscle spasm: Secondary | ICD-10-CM

## 2018-02-20 DIAGNOSIS — R278 Other lack of coordination: Secondary | ICD-10-CM

## 2018-02-20 NOTE — Therapy (Signed)
[facility] MAIN [facility] SERVICES [address] [place], [place], [address] Phone: [phone]   Fax:  [phone]  Physical Therapy Treatment  Patient Details  Name: Carrie Noble MRN: [id number] Date of Birth: 1940-09-08 Referring Provider (PT): Dale [place], MD    Encounter Date: 02/20/2018  PT End of Session - 02/20/18 1505    Visit Number  4    Number of Visits  12    Date for PT Re-Evaluation  04/16/18    PT Start Time  1410    PT Stop Time  1505    PT Time Calculation (min)  55 min    Activity Tolerance  Patient tolerated treatment well;No increased pain    Behavior During Therapy  WFL for tasks assessed/performed       Past Medical History:  Diagnosis Date  . Allergy   . Arthritis   . Gastroesophageal reflux   . Hiatal hernia 1989   status post Nissen fundoplication   . Hx of hysterectomy   . Hyperlipidemia   . Tachycardia    Patient stated that this has been occuring frequently.  . Thyroid disease    Goiter    Past Surgical History:  Procedure Laterality Date  . ABDOMINAL HYSTERECTOMY  1980   partial  . BREAST BIOPSY Right    neg  . BREAST EXCISIONAL BIOPSY Left 2014   neg  . COLONOSCOPY  2015  . ESOPHAGOGASTRIC FUNDOPLICATION  1999  . LAPAROSCOPIC NISSEN FUNDOPLICATION  1999  . TONSILLECTOMY     as well as goiter  . UPPER GI ENDOSCOPY  2015    There were no vitals filed for this visit.  Subjective Assessment - 02/20/18 1417    Subjective  Pt reports her R leg does not feel quite tight and she is able to walk 1/10 of a mile without feeling tired.      Patient Stated Goals  be normal and be able to go when she has the urge and not wet her pants before making it the bathroom           [facility] PT Assessment - 02/20/18 1418      Ambulation/Gait   Gait Comments   carry over with transverse arch coactivation, stronger push off                Pelvic Floor Special Questions - 02/20/18 1502     Prolapse  Anterior Wall   within introitus,distal pubic symphysis(post FW:YOVZ cranial   Pelvic Floor Internal Exam  pt consented verbally without contraindications    Exam Type  Vaginal    Palpation  posterior pelvic scar restrictions 4 -8 o'clock at 3rd layer   limited lengthening ( post Tx: improved lengthening)    Strength  fair squeeze, definite lift   post Tx: more circular contraction        [facility] Adult PT Treatment/Exercise - 02/20/18 1418      Manual Therapy   Internal Pelvic Floor  posterior pelvic scar releases 4 -8 o'clock at 3rd layer                    PT Long Term Goals - 01/29/18 1626      PT LONG TERM GOAL #1   Title  Pt will decrease her PFDI score from 23% to < 11% in order to improve pelvic floor function    Time  12    Period  Weeks    Status  New  PT LONG TERM GOAL #2   Title  Pt will decrease her ODI score from 28% to < 14% in order to improve ADLs     Time  10    Period  Weeks    Status  New      PT LONG TERM GOAL #3   Title  Pt will demo equal pelvic alignment across 2 weeks in order to progress with deep core coordination and strengthening exercises     Time  2    Period  Weeks      PT LONG TERM GOAL #4   Title  Pt will be IND with scoliosis specific HEP in order to minimize BLE problems, improve balance, knee and feet issues     Time  10    Period  Weeks    Status  New      PT LONG TERM GOAL #5   Title  Pt will be able to shop for 1-2 hours without flare up of plant fascititis  in order to participate in the com munity     Time  12    Period  Weeks    Status  New      Additional Long Term Goals   Additional Long Term Goals  Yes      PT LONG TERM GOAL #6   Title  Pt will report no longer having the urge when putting her key in the door and being able to make it to the toilet before leakage in order to improve QOL     Time  4    Period  Weeks    Status  New      PT LONG TERM GOAL #7   Title  Pt will report ed decreased  nocturia from 3 x night to < 2 x night in order to improve QOL and be referred to sleep study to screen in/out OSA if nocturia persists.     Time  8    Period  Weeks    Status  New    Target Date  03/26/18            Plan - 02/20/18 1505    Clinical Impression Statement  Pt 's gait has improved which demonstrates good carry over from manual Tx addressing lower kinetic chain which is now likely to yield longer lasting benefits as pelvic floor gets addressed. Pt today demo'd increased posterior pelvic floor tightness with scar restrictions and slightly lowered bladder position. Following internal pelvic Tx to address these deficits, pt demo'd increased ability to activate anterior pelvic floor mm eliciting a more cranial position of bladder and increased lengthening of pelvic floor mm. Pt required excessive cues for proper deep core coordination and to nor apply excessive effort with abdominal mm and explained to pt how this leads to dyscoordination of pelvic floor.  following training.  Pt continues to benefit from skilled PT.   Rehab Potential  Good    PT Frequency  1x / week    PT Duration  12 weeks    PT Treatment/Interventions  Moist Heat;Therapeutic activities;Therapeutic exercise;Patient/family education;Neuromuscular re-education;Gait training;Electrical Stimulation;Balance training;Scar mobilization;Manual techniques;Functional mobility training;Energy conservation    Consulted and Agree with Plan of Care  Patient       Patient will benefit from skilled therapeutic intervention in order to improve the following deficits and impairments:  Abnormal gait, Improper body mechanics, Pain, Increased muscle spasms, Decreased scar mobility, Decreased coordination, Decreased mobility, Decreased strength, Decreased endurance, Decreased safety awareness, Hypomobility,  Postural dysfunction, Difficulty walking, Decreased balance  Visit Diagnosis: Other muscle spasm  Other lack of  coordination  Muscle weakness (generalized)     Problem List Patient Active Problem List   Diagnosis Date Noted  . Chest pain 12/03/2017  . Vitamin D deficiency 11/30/2017  . Leg weakness 11/30/2017  . Stress 09/04/2016  . Urge incontinence 09/04/2016  . Headache 02/16/2016  . Dry eyes 02/16/2016  . Acute bronchitis 11/26/2015  . Memory change 10/11/2015  . Allergic rhinitis 05/06/2015  . Breast tenderness in female 09/14/2014  . Pain in right hip 07/14/2014  . Carotid bruit 07/14/2014  . Difficulty sleeping 03/30/2014  . Health care maintenance 03/30/2014  . Diverticulosis 03/27/2013  . 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 Abnormal mammogram 09/06/2012  . Osteoporosis 05/29/2012  . Thyroid goiter 05/28/2012  . Barrett's esophagus 05/28/2012  . Hyperlipidemia 01/03/2011  . Palpitations 01/03/2011  . GERD (gastroesophageal reflux disease) 01/03/2011    Mariane Masters ,PT, DPT, E-RYT  02/20/2018, 3:05 PM  [facility] MAIN [facility] SERVICES [address] [place], [place], [address] Phone: [phone]   Fax:  [phone]  Name: KYNNEDY CODY MRN: [id number] Date of Birth: 20-Sep-1940

## 2018-02-20 NOTE — Patient Instructions (Addendum)
Breathing practice with count of  1-2 pause on inhale ( not chest breathing) ( not pushing belly)  2-1 pause on exhale    ________  Practice this before and after meals for optimal digestion   _______  Continue with deep core level 1 and 2 In Level 2 , plant feet flat and knees lined with hips and when other knee moves out, the stable foot is planted     _______  Drink 6 ( 8 fl oz) of water per day

## 2018-02-27 ENCOUNTER — Ambulatory Visit: Payer: Medicare Other | Admitting: Physical Therapy

## 2018-03-06 ENCOUNTER — Ambulatory Visit: Payer: Medicare Other | Attending: Internal Medicine | Admitting: Physical Therapy

## 2018-03-06 DIAGNOSIS — M62838 Other muscle spasm: Secondary | ICD-10-CM

## 2018-03-06 DIAGNOSIS — R278 Other lack of coordination: Secondary | ICD-10-CM | POA: Diagnosis present

## 2018-03-06 DIAGNOSIS — M6281 Muscle weakness (generalized): Secondary | ICD-10-CM | POA: Insufficient documentation

## 2018-03-06 NOTE — Patient Instructions (Signed)
Deep core level 1 and 2  Place hand on chest and one hand on belly to prevent excessive stomach muscle use

## 2018-03-07 NOTE — Therapy (Signed)
[facility] [address] [place], [place], [address] Phone: [phone]   Fax:  [phone]  Physical Therapy Treatment  Patient Details  Name: Carrie Noble MRN: [id number] Date of Birth: 07-09-1940 Referring Provider (PT): Dale Durhamharlene Scott, MD    Encounter Date: 03/06/2018  PT End of Session - 03/07/18 1201    Visit Number  5    Number of Visits  12    Date for PT Re-Evaluation  04/16/18    PT Start Time  1436    PT Stop Time  1530    PT Time Calculation (min)  54 min    Activity Tolerance  Patient tolerated treatment well;No increased pain    Behavior During Therapy  WFL for tasks assessed/performed       Past Medical History:  Diagnosis Date  . Allergy   . Arthritis   . Gastroesophageal reflux   . Hiatal hernia 1989   status post Nissen fundoplication   . Hx of hysterectomy   . Hyperlipidemia   . Tachycardia    Patient stated that this has been occuring frequently.  . Thyroid disease    Goiter    Past Surgical History:  Procedure Laterality Date  . ABDOMINAL HYSTERECTOMY  1980   partial  . BREAST BIOPSY Right    neg  . BREAST EXCISIONAL BIOPSY Left 2014   neg  . COLONOSCOPY  2015  . ESOPHAGOGASTRIC FUNDOPLICATION  1999  . LAPAROSCOPIC NISSEN FUNDOPLICATION  1999  . TONSILLECTOMY     as well as goiter  . UPPER GI ENDOSCOPY  2015    There were no vitals filed for this visit.  Subjective Assessment - 03/06/18 1439    Subjective  Pt feels she is not sure if she has her pelvic floor exercises down right. Pt is doing pretty good with drinking 48 fl oz.  Pt is getting twice a night to get to the bathroom. Pt 's problem is when getting up at night and making it to the toilet in time before leaking.      Patient Stated Goals  be normal and be able to go when she has the urge and not wet her pants before making it the bathroom                      Pelvic Floor Special Questions - 03/07/18  1200    Prolapse  None    Pelvic Floor Internal Exam  pt consented verbally without contraindications    Exam Type  Vaginal    Palpation  4-6 clock scar releases noted (decreased post Tx)     Strength  fair squeeze, definite lift    Strength # of reps  --   cued for toning which elicited contraction, less ab strainin       [facility] Adult PT Treatment/Exercise - 03/07/18 1159      Neuro Re-ed    Neuro Re-ed Details   excessive cues for less ab / chest breathing with pelvic floor coordination       Manual Therapy   Internal Pelvic Floor  STM/MWM at 4-6 o'clock at 3rd layer                   PT Long Term Goals - 01/29/18 1626      PT LONG TERM GOAL #1   Title  Pt will decrease her PFDI score from 23% to < 11% in order to improve pelvic  floor function    Time  12    Period  Weeks    Status  New      PT LONG TERM GOAL #2   Title  Pt will decrease her ODI score from 28% to < 14% in order to improve ADLs     Time  10    Period  Weeks    Status  New      PT LONG TERM GOAL #3   Title  Pt will demo equal pelvic alignment across 2 weeks in order to progress with deep core coordination and strengthening exercises     Time  2    Period  Weeks      PT LONG TERM GOAL #4   Title  Pt will be IND with scoliosis specific HEP in order to minimize BLE problems, improve balance, knee and feet issues     Time  10    Period  Weeks    Status  New      PT LONG TERM GOAL #5   Title  Pt will be able to shop for 1-2 hours without flare up of plant fascititis  in order to participate in the com munity     Time  12    Period  Weeks    Status  New      Additional Long Term Goals   Additional Long Term Goals  Yes      PT LONG TERM GOAL #6   Title  Pt will report no longer having the urge when putting her key in the door and being able to make it to the toilet before leakage in order to improve QOL     Time  4    Period  Weeks    Status  New      PT LONG TERM GOAL #7   Title  Pt  will report ed decreased nocturia from 3 x night to < 2 x night in order to improve QOL and be referred to sleep study to screen in/out OSA if nocturia persists.     Time  8    Period  Weeks    Status  New    Target Date  03/26/18            Plan - 03/07/18 1201    Clinical Impression Statement  Pt continued to benefit from intravaginal manual Tx to minimize perineal scar restrictions. Pt required cues to minimize chest and ab straining with pelvic floor coordination and to optimize diaphragmatic breathing for improved intraabdominal pressures system. Pt demo'd improvements post Tx. Pt continues to benefit from skilled PT.     Rehab Potential  Good    PT Frequency  1x / week    PT Duration  12 weeks    PT Treatment/Interventions  Moist Heat;Therapeutic activities;Therapeutic exercise;Patient/family education;Neuromuscular re-education;Gait training;Electrical Stimulation;Balance training;Scar mobilization;Manual techniques;Functional mobility training;Energy conservation    Consulted and Agree with Plan of Care  Patient       Patient will benefit from skilled therapeutic intervention in order to improve the following deficits and impairments:  Abnormal gait, Improper body mechanics, Pain, Increased muscle spasms, Decreased scar mobility, Decreased coordination, Decreased mobility, Decreased strength, Decreased endurance, Decreased safety awareness, Hypomobility, Postural dysfunction, Difficulty walking, Decreased balance  Visit Diagnosis: Other muscle spasm  Other lack of coordination  Muscle weakness (generalized)     Problem List Patient Active Problem List   Diagnosis Date Noted  . Chest pain 12/03/2017  . Vitamin D  deficiency 11/30/2017  . Leg weakness 11/30/2017  . Stress 09/04/2016  . Urge incontinence 09/04/2016  . Headache 02/16/2016  . Dry eyes 02/16/2016  . Acute bronchitis 11/26/2015  . Memory change 10/11/2015  . Allergic rhinitis 05/06/2015  . Breast  tenderness in female 09/14/2014  . Pain in right hip 07/14/2014  . Carotid bruit 07/14/2014  . Difficulty sleeping 03/30/2014  . Health care maintenance 03/30/2014  . Diverticulosis 03/27/2013  . 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 Abnormal mammogram 09/06/2012  . Osteoporosis 05/29/2012  . Thyroid goiter 05/28/2012  . Barrett's esophagus 05/28/2012  . Hyperlipidemia 01/03/2011  . Palpitations 01/03/2011  . GERD (gastroesophageal reflux disease) 01/03/2011    Mariane Masters ,PT, DPT, E-RYT  03/07/2018, 12:02 PM   [facility] MAIN [facility] SERVICES [address] [place], [place], [address] Phone: [phone]   Fax:  [phone]  Name: Carrie Noble MRN: [id number] Date of Birth: [birth year]

## 2018-03-12 ENCOUNTER — Ambulatory Visit: Payer: Medicare Other | Admitting: Physical Therapy

## 2018-03-12 DIAGNOSIS — M6281 Muscle weakness (generalized): Secondary | ICD-10-CM

## 2018-03-12 DIAGNOSIS — M62838 Other muscle spasm: Secondary | ICD-10-CM | POA: Diagnosis not present

## 2018-03-12 DIAGNOSIS — R278 Other lack of coordination: Secondary | ICD-10-CM

## 2018-03-13 ENCOUNTER — Ambulatory Visit (INDEPENDENT_AMBULATORY_CARE_PROVIDER_SITE_OTHER): Payer: Medicare Other | Admitting: Internal Medicine

## 2018-03-13 ENCOUNTER — Other Ambulatory Visit (HOSPITAL_COMMUNITY)
Admission: RE | Admit: 2018-03-13 | Discharge: 2018-03-13 | Disposition: A | Discharge status: Home / Self Care | Payer: Medicare Other | Source: Ambulatory Visit | Attending: Internal Medicine | Admitting: Internal Medicine

## 2018-03-13 ENCOUNTER — Encounter: Payer: Self-pay | Admitting: Internal Medicine

## 2018-03-13 VITALS — BP 104/68 | HR 91 | Temp 97.6°F | Resp 16 | Wt 140.8 lb

## 2018-03-13 DIAGNOSIS — K2270 Barrett's esophagus without dysplasia: Secondary | ICD-10-CM

## 2018-03-13 DIAGNOSIS — E559 Vitamin D deficiency, unspecified: Secondary | ICD-10-CM

## 2018-03-13 DIAGNOSIS — N949 Unspecified condition associated with female genital organs and menstrual cycle: Secondary | ICD-10-CM

## 2018-03-13 DIAGNOSIS — R300 Dysuria: Secondary | ICD-10-CM | POA: Diagnosis not present

## 2018-03-13 DIAGNOSIS — N760 Acute vaginitis: Secondary | ICD-10-CM | POA: Diagnosis present

## 2018-03-13 DIAGNOSIS — G479 Sleep disorder, unspecified: Secondary | ICD-10-CM | POA: Diagnosis not present

## 2018-03-13 DIAGNOSIS — H04123 Dry eye syndrome of bilateral lacrimal glands: Secondary | ICD-10-CM

## 2018-03-13 DIAGNOSIS — R102 Pelvic and perineal pain: Secondary | ICD-10-CM

## 2018-03-13 DIAGNOSIS — K219 Gastro-esophageal reflux disease without esophagitis: Secondary | ICD-10-CM

## 2018-03-13 DIAGNOSIS — E785 Hyperlipidemia, unspecified: Secondary | ICD-10-CM

## 2018-03-13 LAB — POCT URINALYSIS DIPSTICK
Bilirubin, UA: NEGATIVE
Glucose, UA: NEGATIVE
Ketones, UA: NEGATIVE
Leukocytes, UA: NEGATIVE
Nitrite, UA: NEGATIVE
Protein, UA: NEGATIVE
Spec Grav, UA: >=1.03 — AB (ref 1.010–1.025)
Urobilinogen, UA: 0.2 E.U./dL
pH, UA: 5.5 (ref 5.0–8.0)

## 2018-03-13 NOTE — Patient Instructions (Signed)
Melatonin 3mg  1-2 hours before bed  Trial of estrace cream as we discussed.    Continue omeprazole.

## 2018-03-13 NOTE — Progress Notes (Signed)
Patient ID: Carrie Noble, female   DOB: [birth year], 78 y.o.   MRN: [id number]   Subjective:    Patient ID: Carrie Noble, female    DOB: 02-21-1940, 78 y.o.   MRN: [id number]  HPI  Patient here as a work in with several concerns.  States after therapy (massaging), she noted some lower abdominal soreness.  Still some lower abdominal discomfort.   Notes some vaginal pain.  No diarrhea or constipation.  No vaginal discharge.  Was questioning if could be related to vaginal dryness.  Also has noticed some night sweats.  No significant problems during the day.  Not sleeping well.  Does not feel rested when wakes up in am.  daytime somnolence.  Discussed trial of melatonin.  States feels a "vapor" in her throat.  Denies any significant acid reflux.  Some epigastric pain and soreness.  She is eating.  Eyes burn.  Feel irritated.  Has seen ophthalmology.  Has been told had dry eyes.  Discussed f/u with ophthalmology.  Has tried artificial tears.     Past Medical History:  Diagnosis Date  . Allergy   . Arthritis   . Gastroesophageal reflux   . Hiatal hernia 1989   status post Nissen fundoplication   . Hx of hysterectomy   . Hyperlipidemia   . Tachycardia    Patient stated that this has been occuring frequently.  . Thyroid disease    Goiter   Past Surgical History:  Procedure Laterality Date  . ABDOMINAL HYSTERECTOMY  1980   partial  . BREAST BIOPSY Right    neg  . BREAST EXCISIONAL BIOPSY Left 2014   neg  . COLONOSCOPY  2015  . ESOPHAGOGASTRIC FUNDOPLICATION  1999  . LAPAROSCOPIC NISSEN FUNDOPLICATION  1999  . TONSILLECTOMY     as well as goiter  . UPPER GI ENDOSCOPY  2015   Family History  Problem Relation Age of Onset  . Stroke Mother 89  . Arthritis Mother   . Heart disease Mother   . Hypertension Mother   . Stroke Father 94  . Hypertension Father   . Rheumatic fever Brother        and multiple open heart surgeries  . Diabetes Brother        type 2  . Stroke Brother   .  Other Sister        Coronary Atherosclerosis  . Stroke Brother   . Stroke Sister   . Heart disease Sister   . Dementia Sister   . Cancer Sister        ovarian   Social History   Socioeconomic History  . Marital status: Married    Spouse name: Not on file  . Number of children: Not on file  . Years of education: Not on file  . Highest education level: Not on file  Occupational History  . Occupation: Retired    Associate Professor: [employer]  Social Needs  . Financial resource strain: Not on file  . Food insecurity:    Worry: Not on file    Inability: Not on file  . Transportation needs:    Medical: Not on file    Non-medical: Not on file  Tobacco Use  . Smoking status: Never Smoker  . Smokeless tobacco: Never Used  Substance and Sexual Activity  . Alcohol use: No    Alcohol/week: 0.0 standard drinks  . Drug use: No  . Sexual activity: Never  Lifestyle  . Physical activity:    Days per  week: Not on file    Minutes per session: Not on file  . Stress: Not on file  Relationships  . Social connections:    Talks on phone: Not on file    Gets together: Not on file    Attends religious service: Not on file    Active member of club or organization: Not on file    Attends meetings of clubs or organizations: Not on file    Relationship status: Not on file  Other Topics Concern  . Not on file  Social History Narrative   Occasionally drinks coffee.    Outpatient Encounter Medications as of 03/13/2018  Medication Sig  . Biotin 1000 MCG tablet Take 1,000 mcg by mouth 3 (three) times daily. Reported on 05/13/2015  . Calcium Carbonate (CALCIUM 600 PO) Take by mouth daily. Reported on 05/13/2015  . Cholecalciferol (VITAMIN D PO) Take by mouth daily. Reported on 05/13/2015  . Epinastine HCl 0.05 % ophthalmic solution Place 1 drop into both eyes 2 (two) times daily as needed.  . hydrocortisone 2.5 % cream   . ketotifen (ALAWAY) 0.025 % ophthalmic solution 1 drop 2 (two) times daily.  .  mometasone (NASONEX) 50 MCG/ACT nasal spray 1 SPRAY 1-2 TIMES PER DAY AS NEEDED.  Marland Kitchen neomycin-polymyxin-dexamethasone (MAXITROL) 0.1 % ophthalmic suspension Place 1 drop into both eyes 2 (two) times daily.  . Olopatadine HCl (PAZEO) 0.7 % SOLN Place 1 drop into both eyes daily.  Marland Kitchen omeprazole (PRILOSEC) 20 MG capsule TAKE 1 CAPSULE BY MOUTH EVERY DAY  . rosuvastatin (CRESTOR) 10 MG tablet Take 1 tablet (10 mg total) by mouth daily.  . vitamin B-12 (CYANOCOBALAMIN) 1000 MCG tablet Take 1,000 mcg by mouth daily. Reported on 05/13/2015  . [DISCONTINUED] azithromycin (ZITHROMAX) 250 MG tablet Take 2 tablets on day 1, take 1 tablet on days 2-5  . [DISCONTINUED] methylPREDNISolone (MEDROL DOSEPAK) 4 MG TBPK tablet Take according to pack instructions   Facility-Administered Encounter Medications as of 03/13/2018  Medication  . betamethasone acetate-betamethasone sodium phosphate (CELESTONE) injection 3 mg  . betamethasone acetate-betamethasone sodium phosphate (CELESTONE) injection 3 mg    Review of Systems  Constitutional: Negative for appetite change and unexpected weight change.  HENT: Negative for congestion and sinus pressure.   Eyes:       Eyes feel dry and burn.    Respiratory: Negative for cough, chest tightness and shortness of breath.   Cardiovascular: Negative for chest pain, palpitations and leg swelling.  Gastrointestinal: Negative for constipation, diarrhea and vomiting.       Epigastric pain/soreness.  Lower abdominal/pelvic discomfort as outlined.    Genitourinary: Negative for difficulty urinating and dysuria.       Vaginal pain as outlined.    Musculoskeletal: Negative for joint swelling and myalgias.  Skin: Negative for color change and rash.  Neurological: Negative for dizziness, light-headedness and headaches.  Psychiatric/Behavioral: Negative for agitation and dysphoric mood.       Objective:    Physical Exam Constitutional:      General: She is not in acute  distress.    Appearance: Normal appearance.  HENT:     Nose: Nose normal. No congestion.     Mouth/Throat:     Pharynx: No oropharyngeal exudate or posterior oropharyngeal erythema.  Eyes:     General:        Right eye: No discharge.        Left eye: No discharge.     Conjunctiva/sclera: Conjunctivae normal.  Neck:  Musculoskeletal: Neck supple. No muscular tenderness.     Thyroid: No thyromegaly.  Cardiovascular:     Rate and Rhythm: Normal rate and regular rhythm.  Pulmonary:     Effort: No respiratory distress.     Breath sounds: Normal breath sounds. No wheezing.  Abdominal:     General: Bowel sounds are normal.     Palpations: Abdomen is soft.     Tenderness: There is no abdominal tenderness.  Genitourinary:    Comments: Normal external genitalia.  Vaginal vault without lesions.  Atrophy changes present.  Could not appreciate any adnexal masses or tenderness.  KOH/wet prep sent.   Musculoskeletal:        General: No swelling or tenderness.  Lymphadenopathy:     Cervical: No cervical adenopathy.  Skin:    Findings: No erythema or rash.  Neurological:     Mental Status: She is alert.  Psychiatric:        Mood and Affect: Mood normal.        Behavior: Behavior normal.     BP 104/68   Pulse 91   Temp 97.6 F (36.4 C) (Oral)   Resp 16   Wt 140 lb 12.8 oz (63.9 kg)   SpO2 97%   BMI 24.94 kg/m  Wt Readings from Last 3 Encounters:  03/13/18 140 lb 12.8 oz (63.9 kg)  01/29/18 140 lb 12.8 oz (63.9 kg)  01/03/18 143 lb 3.2 oz (65 kg)     Lab Results  Component Value Date   WBC 4.9 11/30/2017   HGB 13.7 11/30/2017   HCT 40.1 11/30/2017   PLT 354.0 11/30/2017   GLUCOSE 122 (H) 02/16/2018   CHOL 173 01/15/2018   TRIG 101.0 01/15/2018   HDL 59.40 01/15/2018   LDLDIRECT 141.0 09/01/2016   LDLCALC 94 01/15/2018   ALT 16 01/15/2018   AST 18 01/15/2018   NA 140 02/16/2018   K 4.9 02/16/2018   CL 105 02/16/2018   CREATININE 1.00 02/16/2018   BUN 15  02/16/2018   CO2 28 02/16/2018   TSH 2.15 11/30/2017   HGBA1C 5.7 03/29/2013    Mm 3d Screen Breast Bilateral  Result Date: 11/14/2017 CLINICAL DATA:  Screening. EXAM: DIGITAL SCREENING BILATERAL MAMMOGRAM WITH TOMO AND CAD COMPARISON:  Previous exam(s). ACR Breast Density Category c: The breast tissue is heterogeneously dense, which may obscure small masses. FINDINGS: There are no findings suspicious for malignancy. Images were processed with CAD. IMPRESSION: No mammographic evidence of malignancy. A result letter of this screening mammogram will be mailed directly to the patient. RECOMMENDATION: Screening mammogram in one year. (Code:SM-B-01Y) BI-RADS CATEGORY  1: Negative. Electronically Signed   By: Bary Richard M.D.   On: 11/14/2017 08:42       Assessment & Plan:   Problem List Items Addressed This Visit    Barrett's esophagus    S/p EGD 10/2016.  Saw Dr Troy Sine.  Had discussed CT scan.  Discussed scanning with her today.  She wants to hold on further testing.  Follow.  Will increase omeprazole to bid and see if helps with the epigastric pain and symptoms.        Difficulty sleeping    Trial of melatonin.  States Dr Elenore Rota did sleep study. Obtain records.        Dry eyes    Discussed with her today regarding f/u with ophthalmology.  She agreed to f/u.    Addendum.  Called back and wanted to hold on f/u with ophthalmology.  GERD (gastroesophageal reflux disease)    Increase omeprazole as outlined.  Follow.        Hyperlipidemia    Low cholesterol diet and exercise.  Follow lipid panel.       Pelvic pain    Exam as outlined.  Estrogen cream as outlined.  Discussed further evaluation including CT scan/pelvic ultrasound.  She wants to hold at this time.  Follow.        Vaginal burning    Wet prep obtained.  Atrophy changes noted.  Has estrogen cream at home.  Prescribed by gyn.  Will start using.  Gave her directions on use.  Discussed possible side effects and  risks of estrogen.        Vitamin D deficiency    Follow vitamin D level.         Other Visit Diagnoses    Dysuria    -  Primary   Relevant Orders   POCT urinalysis dipstick (Completed)   Urine Microscopic (Completed)   Urine Culture (Completed)   Acute vaginitis       Relevant Orders   Cervicovaginal ancillary only( [place]) (Completed)       Dale Durhamharlene Levan Aloia, MD

## 2018-03-13 NOTE — Therapy (Signed)
[place] [facility] MAIN [facility] SERVICES [address] [place], [place], [address] Phone: [phone]   Fax:  [phone]  Physical Therapy Treatment  Patient Details  Name: Carrie Noble MRN: [id number] Date of Birth: 13-Nov-1940 Referring Provider (PT): Dale Harlan, MD    Encounter Date: 03/12/2018  PT End of Session - 03/12/18 1603    Visit Number  6    Number of Visits  12    Date for PT Re-Evaluation  04/16/18    PT Start Time  1530    PT Stop Time  1600    PT Time Calculation (min)  30 min    Activity Tolerance  Patient tolerated treatment well;No increased pain    Behavior During Therapy  WFL for tasks assessed/performed       Past Medical History:  Diagnosis Date  . Allergy   . Arthritis   . Gastroesophageal reflux   . Hiatal hernia 1989   status post Nissen fundoplication   . Hx of hysterectomy   . Hyperlipidemia   . Tachycardia    Patient stated that this has been occuring frequently.  . Thyroid disease    Goiter    Past Surgical History:  Procedure Laterality Date  . ABDOMINAL HYSTERECTOMY  1980   partial  . BREAST BIOPSY Right    neg  . BREAST EXCISIONAL BIOPSY Left 2014   neg  . COLONOSCOPY  2015  . ESOPHAGOGASTRIC FUNDOPLICATION  1999  . LAPAROSCOPIC NISSEN FUNDOPLICATION  1999  . TONSILLECTOMY     as well as goiter  . UPPER GI ENDOSCOPY  2015    There were no vitals filed for this visit.  Subjective Assessment - 03/12/18 1537    Subjective  Pt felt sore after last session in the pelvic area and this soreness has decreased since last week. Pt feels stinging in her bladder since yesterday. Not noticed any changes in her urine.  Pt is trying to more water from 3 glasses to 5-6 glasses.  Pt is getting up 1-2 x night to urinate instead of 2-3 x night and she is not leakage before getting to the toilet. During the day, pt has leakage once a day in stead of 2 x day. Pt is leakage less by practicing the proper way out of  lounge chair and by laying off caffeine.   Pt is having dry eyes, burning eyes, and dry mouth which pt has had for a long time.  Her skin on her face feels chapped . Pt has had night sweats for the past 5 months.      Patient Stated Goals  be normal and be able to go when she has the urge and not wet her pants before making it the bathroom           [facility] PT Assessment - 03/12/18 1548      Coordination   Gross Motor Movements are Fluid and Coordinated  --   improved pelvic floor coordinaiton without ab straining                  [facility] Adult PT Treatment/Exercise - 03/12/18 1549      Neuro Re-ed    Neuro Re-ed Details   reviewed deep core       Therapeutic Activities:  Called pt's PCP to report of her medical Sx (reported in subjective) and was able to get pt an appt with PCP tomorrow  PT Long Term Goals - 03/12/18 1540      PT LONG TERM GOAL #1   Title  Pt will decrease her PFDI score from 23% to < 11% in order to improve pelvic floor function    Time  12    Period  Weeks    Status  On-going      PT LONG TERM GOAL #2   Title  Pt will decrease her ODI score from 28% to < 14% in order to improve ADLs     Time  10    Period  Weeks    Status  On-going      PT LONG TERM GOAL #3   Title  Pt will demo equal pelvic alignment across 2 weeks in order to progress with deep core coordination and strengthening exercises     Time  2    Period  Weeks    Status  Achieved      PT LONG TERM GOAL #4   Title  Pt will be IND with scoliosis specific HEP in order to minimize BLE problems, improve balance, knee and feet issues     Time  10    Period  Weeks    Status  On-going      PT LONG TERM GOAL #5   Title  Pt will be able to shop for 1-2 hours without flare up of plant fascititis  in order to participate in the com munity     Time  12    Period  Weeks    Status  Achieved      PT LONG TERM GOAL #6   Title  Pt will report no longer having the urge when  putting her key in the door and being able to make it to the toilet before leakage in order to improve QOL     Time  4    Period  Weeks    Status  Achieved      PT LONG TERM GOAL #7   Title  Pt will report ed decreased nocturia from 3 x night to < 2 x night in order to improve QOL and be referred to sleep study to screen in/out OSA if nocturia persists.     Time  8    Period  Weeks    Status  On-going            Plan - 03/12/18 1603    Clinical Impression Statement  Pelvic PT improvements:  Pt reports she is getting up 1-2 x night to urinate instead of 2-3 x night and she is not leakage before getting to the toilet. During the day, pt is able to make it to the bathroom without leakage. Pt arrived to her appt 30 min late due to confusion about her appt time. Focused on reviewing deep core coordination which pt is demonstrating better form without straining ab muscles.   Concerns:   Pt felt sore after last session in the pelvic area and this soreness has decreased since last week. Pt feels stinging in her bladder since yesterday. Although she denied  any changes in her urine nor burning with urination and reports drinking more water from 3 glasses to 5-6 glasses per day, DPT phoned pt's PCP to set up an appt to get a urine culture to r/o UTI.    Pt is having dry eyes, burning eyes, and dry mouth which pt has had for a long time.  Her skin on her face feels chapped.  Pt expressed that she "has had lots going on her mind" but did not express more.  Pt has had night sweats for the past 5 months which she states she has not reported to her PCP. Therefore, DPT phoned PCP to inform of this Sx and pt was scheduled for appt 2/18@ 3pm.         Rehab Potential  Good    PT Frequency  1x / week    PT Duration  12 weeks    PT Treatment/Interventions  Moist Heat;Therapeutic activities;Therapeutic exercise;Patient/family education;Neuromuscular re-education;Gait training;Electrical  Stimulation;Balance training;Scar mobilization;Manual techniques;Functional mobility training;Energy conservation    Consulted and Agree with Plan of Care  Patient       Patient will benefit from skilled therapeutic intervention in order to improve the following deficits and impairments:  Abnormal gait, Improper body mechanics, Pain, Increased muscle spasms, Decreased scar mobility, Decreased coordination, Decreased mobility, Decreased strength, Decreased endurance, Decreased safety awareness, Hypomobility, Postural dysfunction, Difficulty walking, Decreased balance  Visit Diagnosis: Other muscle spasm  Other lack of coordination  Muscle weakness (generalized)     Problem List Patient Active Problem List   Diagnosis Date Noted  . Chest pain 12/03/2017  . Vitamin D deficiency 11/30/2017  . Leg weakness 11/30/2017  . Stress 09/04/2016  . Urge incontinence 09/04/2016  . Headache 02/16/2016  . Dry eyes 02/16/2016  . Acute bronchitis 11/26/2015  . Memory change 10/11/2015  . Allergic rhinitis 05/06/2015  . Breast tenderness in female 09/14/2014  . Pain in right hip 07/14/2014  . Carotid bruit 07/14/2014  . Difficulty sleeping 03/30/2014  . Health care maintenance 03/30/2014  . Diverticulosis 03/27/2013  . 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 Abnormal mammogram 09/06/2012  . Osteoporosis 05/29/2012  . Thyroid goiter 05/28/2012  . Barrett's esophagus 05/28/2012  . Hyperlipidemia 01/03/2011  . Palpitations 01/03/2011  . GERD (gastroesophageal reflux disease) 01/03/2011    Mariane MastersYeung,Shin Yiing ,PT, DPT, E-RYT  03/13/2018, 12:56 PM  [place] [facility] MAIN [facility] [address] [place], [place], [address] Phone: [phone]   Fax:  [phone]  Name: Carrie Noble MRN: [id number] Date of Birth: September 22, 1940

## 2018-03-14 ENCOUNTER — Telehealth: Payer: Self-pay

## 2018-03-14 LAB — URINE CULTURE
MICRO NUMBER:: 209606
Result:: NO GROWTH
SPECIMEN QUALITY:: ADEQUATE

## 2018-03-14 LAB — URINALYSIS, MICROSCOPIC ONLY: RBC / HPF: NONE SEEN (ref 0–?)

## 2018-03-14 NOTE — Telephone Encounter (Signed)
Copied from CRM [id number]. Topic: General - Other >> Mar 14, 2018  9:24 AM Carrie Noble E wrote: Reason for CRM: Pt was calling to let Dr. Lorin Picket and the nurse know that after thinking about it she decided not to go see Dr. Dellie Burns at [facility] and she will hold off for a while on this.

## 2018-03-14 NOTE — Telephone Encounter (Signed)
FYI

## 2018-03-15 LAB — CERVICOVAGINAL ANCILLARY ONLY
Bacterial vaginitis: NEGATIVE
Candida vaginitis: NEGATIVE

## 2018-03-17 ENCOUNTER — Encounter: Payer: Self-pay | Admitting: Internal Medicine

## 2018-03-17 DIAGNOSIS — N9489 Other specified conditions associated with female genital organs and menstrual cycle: Secondary | ICD-10-CM | POA: Insufficient documentation

## 2018-03-17 DIAGNOSIS — R102 Pelvic and perineal pain: Secondary | ICD-10-CM | POA: Insufficient documentation

## 2018-03-17 DIAGNOSIS — N949 Unspecified condition associated with female genital organs and menstrual cycle: Secondary | ICD-10-CM | POA: Insufficient documentation

## 2018-03-17 NOTE — Assessment & Plan Note (Signed)
Follow vitamin D level.  

## 2018-03-17 NOTE — Assessment & Plan Note (Signed)
Discussed with her today regarding f/u with ophthalmology.  She agreed to f/u.    Addendum.  Called back and wanted to hold on f/u with ophthalmology.

## 2018-03-17 NOTE — Assessment & Plan Note (Signed)
Low cholesterol diet and exercise.  Follow lipid panel.   

## 2018-03-17 NOTE — Assessment & Plan Note (Signed)
Exam as outlined.  Estrogen cream as outlined.  Discussed further evaluation including CT scan/pelvic ultrasound.  She wants to hold at this time.  Follow.

## 2018-03-17 NOTE — Assessment & Plan Note (Signed)
Wet prep obtained.  Atrophy changes noted.  Has estrogen cream at home.  Prescribed by gyn.  Will start using.  Gave her directions on use.  Discussed possible side effects and risks of estrogen.

## 2018-03-17 NOTE — Assessment & Plan Note (Signed)
Increase omeprazole as outlined.  Follow.

## 2018-03-17 NOTE — Assessment & Plan Note (Signed)
S/p EGD 10/2016.  Saw Dr Troy Sine.  Had discussed CT scan.  Discussed scanning with her today.  She wants to hold on further testing.  Follow.  Will increase omeprazole to bid and see if helps with the epigastric pain and symptoms.

## 2018-03-17 NOTE — Assessment & Plan Note (Signed)
Trial of melatonin.  States Dr Elenore Rota did sleep study. Obtain records.

## 2018-03-20 ENCOUNTER — Ambulatory Visit: Payer: Medicare Other | Admitting: Physical Therapy

## 2018-03-27 ENCOUNTER — Encounter: Payer: Medicare Other | Admitting: Physical Therapy

## 2018-04-06 ENCOUNTER — Telehealth: Payer: Self-pay

## 2018-04-06 NOTE — Telephone Encounter (Signed)
Referral was placed to Dr. Troy Sine in 11/2017. Can we send to another GI office?

## 2018-04-06 NOTE — Telephone Encounter (Signed)
Copied from CRM [id number]. Topic: Referral - Request for Referral >> Apr 06, 2018  9:13 AM Wyonia Hough E wrote: Has patient seen PCP for this complaint? Yes  *If NO, is insurance requiring patient see PCP for this issue before PCP can refer them? Referral for which specialty: Gastroenterologist  Preferred provider/office: Dr. Daleen Squibb or anyone that Dr. Lorin Picket would prefer  Reason for referral: Indigestion  Pt doent want to go to the office or provider she has seen before at [facility] clinic

## 2018-04-09 NOTE — Telephone Encounter (Signed)
Sent to [facility] for Dr. Servando Snare per pt request.

## 2018-04-13 ENCOUNTER — Telehealth: Payer: Self-pay | Admitting: Radiology

## 2018-04-13 NOTE — Telephone Encounter (Signed)
Pt coming in for labs Monday, please place future orders. Thank you 

## 2018-04-13 NOTE — Telephone Encounter (Signed)
Yes I apologize it looks like pt cancelled her lab appt for Monday after I sent message to you.

## 2018-04-13 NOTE — Telephone Encounter (Signed)
I do not show she is scheduled for lab.  Let me know if I need to do anything more.  Thanks.    Dr Lorin Picket

## 2018-04-16 ENCOUNTER — Other Ambulatory Visit: Payer: Medicare Other

## 2018-04-18 ENCOUNTER — Ambulatory Visit: Payer: Medicare Other | Admitting: Internal Medicine

## 2018-04-19 ENCOUNTER — Other Ambulatory Visit: Payer: Self-pay | Admitting: *Deleted

## 2018-04-19 MED ORDER — OMEPRAZOLE 20 MG PO CPDR: DELAYED_RELEASE_CAPSULE | ORAL | 3 refills | Status: DC

## 2018-04-26 ENCOUNTER — Encounter: Payer: Self-pay | Admitting: *Deleted

## 2018-04-26 ENCOUNTER — Ambulatory Visit: Payer: Self-pay | Admitting: *Deleted

## 2018-04-26 NOTE — Telephone Encounter (Signed)
Returned call to patient after receiving voicemail message with concerns of receiving the pneumonia vaccine due to the corona virus Pt states that she has been hearing that some of the symptoms of the corona virus are similar to pneumonia and wanted to know if she would need to come in to have the pneumonia vaccine as a preventative or wait until a later time. Pt states she knows it has been a while sine she received the pneumonia vaccine and the last documented vaccine(PCV13) in chart was noted to be 2006.Pt not currently having any symptoms. Pt advised that the corona virus could cause pneumonia but the pneumonia vaccine does not prevent COVID-19. Explained to pt to prevent the spread of COVID to be practicing social distancing, hand washing and avoiding contact with anyone who are known to have been positive with the coronavirus infection. Pt verbalized understanding. Pt also wanted to make Dr. Lorin Picket aware that her appt with gastro was moved to 07/03/18 and her appt with Dr. Lorin Picket was on 06/26/18. Pt can be contacted at [phone] with recommendations on when to receive the pneumonia vaccine.  Reason for Disposition . COVID-19 Prevention and Healthy Living, questions about . COVID-19, questions about  Protocols used: CORONAVIRUS (COVID-19) DIAGNOSED OR SUSPECTED-A-AH

## 2018-04-26 NOTE — Telephone Encounter (Signed)
Pt wanting to know if she should get pneumonia shot

## 2018-04-26 NOTE — Telephone Encounter (Signed)
Confirm that she is not having any problems now.  I am ok if she wants to get the pneumonia vaccine, but correct, thiw will not prevent her from getting corona virus.

## 2018-04-27 NOTE — Telephone Encounter (Signed)
Patient not having any symptoms right now. Advised she could wait to get PNA vaccine if she would like. Also advised that she can keep appt with GI in June and appt with [place] as well

## 2018-04-30 ENCOUNTER — Encounter: Payer: Self-pay | Admitting: Physical Therapy

## 2018-04-30 DIAGNOSIS — M6281 Muscle weakness (generalized): Secondary | ICD-10-CM

## 2018-04-30 DIAGNOSIS — M62838 Other muscle spasm: Secondary | ICD-10-CM

## 2018-04-30 DIAGNOSIS — R278 Other lack of coordination: Secondary | ICD-10-CM

## 2018-04-30 NOTE — Therapy (Signed)
[facility] MAIN [facility] SERVICES [address] [place], [place], [address] Phone: [phone]   Fax:  [phone]  Patient Details  Name: Carrie Noble MRN: [id number] Date of Birth: [birth year] Referring Provider:  Dr. Lorin Picket   Discharge Summary     At her 6th session 03/12/18:   Pelvic PT improvements:  Pt reports she is getting up 1-2 x night to urinate instead of 2-3 x night and she is not leakage before getting to the toilet. During the day, pt is able to make it to the bathroom without leakage.  Focused on reviewing deep core coordination which pt is demonstrating better form without straining ab muscles. Pt has demo'd improvements with pelvic alignment and decreased pelvic floor tightness, decreased leg mm tightness, decreased plantar fascitis, improved gait.    Concerns:  Pt felt sore after last session in the pelvic area and this soreness has decreased since last week. Pt feels stinging in her bladder since yesterday. Although she denied any changes in her urine nor burning with urination and reports drinking more water from 3 glasses to 5-6 glasses per day, DPT phoned pt's PCP to set up an appt to get a urine culture to r/o UTI.    Pt is having dry eyes, burning eyes, and dry mouth which pt has had for a long time.  Her skin on her face feels chapped. Pt expressed that she "has had lots going on her mind" but did not express more.  Pt has had night sweats for the past 5 months which she states she has not reported to her PCP. Therefore, DPT phoned PCP to inform of this Sx and pt was scheduled for appt 2/18@ 3pm.     On 03/19/18:   Front desk staff notified PPT that pt left a voice message stating the  Per Dr. Lorin Picket she didn't need anymore therapy. She has cancelled next 2 appointments.  Pt is discharged at this time.   PT Long Term Goals - 03/12/18 1540      PT LONG TERM GOAL #1   Title  Pt will decrease her PFDI score from 23% to < 11% in  order to improve pelvic floor function    Time  12    Period  Weeks    Status  On-going      PT LONG TERM GOAL #2   Title  Pt will decrease her ODI score from 28% to < 14% in order to improve ADLs     Time  10    Period  Weeks    Status  On-going      PT LONG TERM GOAL #3   Title  Pt will demo equal pelvic alignment across 2 weeks in order to progress with deep core coordination and strengthening exercises     Time  2    Period  Weeks    Status  Achieved      PT LONG TERM GOAL #4   Title  Pt will be IND with scoliosis specific HEP in order to minimize BLE problems, improve balance, knee and feet issues     Time  10    Period  Weeks    Status  On-going      PT LONG TERM GOAL #5   Title  Pt will be able to shop for 1-2 hours without flare up of plant fascititis  in order to participate in the com munity     Time  12  Period  Weeks    Status  Achieved      PT LONG TERM GOAL #6   Title  Pt will report no longer having the urge when putting her key in the door and being able to make it to the toilet before leakage in order to improve QOL     Time  4    Period  Weeks    Status  Achieved      PT LONG TERM GOAL #7   Title  Pt will report ed decreased nocturia from 3 x night to < 2 x night in order to improve QOL and be referred to sleep study to screen in/out OSA if nocturia persists.     Time  8    Period  Weeks    Status  Achieved               Mariane Masters ,PT, DPT, E-RYT  04/30/2018, 11:35 AM  [place] [facility] MAIN [facility] SERVICES [address] [place], [place], [address] Phone: [phone]   Fax:  [phone]

## 2018-05-01 ENCOUNTER — Ambulatory Visit: Payer: Medicare Other | Admitting: Gastroenterology

## 2018-06-20 ENCOUNTER — Telehealth: Payer: Self-pay | Admitting: *Deleted

## 2018-06-20 DIAGNOSIS — E785 Hyperlipidemia, unspecified: Secondary | ICD-10-CM

## 2018-06-20 NOTE — Telephone Encounter (Signed)
Please place future lab orders.   

## 2018-06-20 NOTE — Telephone Encounter (Signed)
Orders placed for labs

## 2018-06-21 ENCOUNTER — Other Ambulatory Visit: Payer: Self-pay

## 2018-06-21 ENCOUNTER — Other Ambulatory Visit (INDEPENDENT_AMBULATORY_CARE_PROVIDER_SITE_OTHER): Payer: Medicare Other

## 2018-06-21 DIAGNOSIS — E785 Hyperlipidemia, unspecified: Secondary | ICD-10-CM | POA: Diagnosis not present

## 2018-06-21 LAB — LIPID PANEL
Cholesterol: 262 mg/dL — ABNORMAL HIGH (ref 0–200)
HDL: 50.5 mg/dL (ref >39.00)
LDL Cholesterol: 185 mg/dL — ABNORMAL HIGH (ref 0–99)
NonHDL: 211.2
Total CHOL/HDL Ratio: 5
Triglycerides: 131 mg/dL (ref 0.0–149.0)
VLDL: 26.2 mg/dL (ref 0.0–40.0)

## 2018-06-21 LAB — BASIC METABOLIC PANEL
BUN: 16 mg/dL (ref 6–23)
CO2: 29 mEq/L (ref 19–32)
Calcium: 9.5 mg/dL (ref 8.4–10.5)
Chloride: 104 mEq/L (ref 96–112)
Creatinine, Ser: 0.99 mg/dL (ref 0.40–1.20)
GFR: 54.09 mL/min — ABNORMAL LOW (ref >60.00)
Glucose, Bld: 94 mg/dL (ref 70–99)
Potassium: 4.9 mEq/L (ref 3.5–5.1)
Sodium: 141 mEq/L (ref 135–145)

## 2018-06-21 LAB — HEPATIC FUNCTION PANEL
ALT: 16 U/L (ref 0–35)
AST: 16 U/L (ref 0–37)
Albumin: 4.2 g/dL (ref 3.5–5.2)
Alkaline Phosphatase: 122 U/L — ABNORMAL HIGH (ref 39–117)
Bilirubin, Direct: 0.1 mg/dL (ref 0.0–0.3)
Total Bilirubin: 0.6 mg/dL (ref 0.2–1.2)
Total Protein: 6.7 g/dL (ref 6.0–8.3)

## 2018-06-26 ENCOUNTER — Encounter: Payer: Self-pay | Admitting: Internal Medicine

## 2018-06-26 ENCOUNTER — Other Ambulatory Visit: Payer: Self-pay

## 2018-06-26 ENCOUNTER — Ambulatory Visit (INDEPENDENT_AMBULATORY_CARE_PROVIDER_SITE_OTHER): Payer: Medicare Other | Admitting: Internal Medicine

## 2018-06-26 DIAGNOSIS — J309 Allergic rhinitis, unspecified: Secondary | ICD-10-CM | POA: Diagnosis not present

## 2018-06-26 DIAGNOSIS — K2270 Barrett's esophagus without dysplasia: Secondary | ICD-10-CM | POA: Diagnosis not present

## 2018-06-26 DIAGNOSIS — E7800 Pure hypercholesterolemia, unspecified: Secondary | ICD-10-CM

## 2018-06-26 DIAGNOSIS — F439 Reaction to severe stress, unspecified: Secondary | ICD-10-CM

## 2018-06-26 DIAGNOSIS — E559 Vitamin D deficiency, unspecified: Secondary | ICD-10-CM

## 2018-06-26 DIAGNOSIS — R002 Palpitations: Secondary | ICD-10-CM

## 2018-06-26 DIAGNOSIS — K219 Gastro-esophageal reflux disease without esophagitis: Secondary | ICD-10-CM

## 2018-06-26 DIAGNOSIS — G479 Sleep disorder, unspecified: Secondary | ICD-10-CM | POA: Diagnosis not present

## 2018-06-26 NOTE — Progress Notes (Signed)
Patient ID: Carrie Noble, female   DOB: 1940/05/06, 78 y.o.   MRN: [id number]   Virtual Visit via telephone Note  This visit type was conducted due to national recommendations for restrictions regarding the COVID-19 pandemic (e.g. social distancing).  This format is felt to be most appropriate for this patient at this time.  All issues noted in this document were discussed and addressed.  No physical exam was performed (except for noted visual exam findings with Video Visits).   I connected with Carrie Noble by telephone and verified that I am speaking with the correct person using two identifiers. Location patient: home Location provider: work  Persons participating in the telephone visit: patient, provider  I discussed the limitations, risks, security and privacy concerns of performing an evaluation and management service by telephone and the availability of in person appointments. The patient expressed understanding and agreed to proceed.   Reason for visit: scheduled follow up.   HPI: She reports she is doing relatively well.  Was having problems with palpitations.  Saw Dr Gwen Pounds.  Last evaluated 05/31/2018.  Felt no further w/up warranted.  Is exercising.  Push mows her yard.  No problems with this activity.  No chest pain.  No sob.  Has history of Barretts.  On omeprazole.  Taking bid now.  Has f/u with Dr Smith Mince 07/03/18.  No abdominal pain.  Bowels moving.  Last visit, she was having problems with abdominal/vaginal discomfort.  This has resolved.  Not an issue now.  Was having problems with her sleep.  Saw Dr Elenore Rota.  Sleep study with chronic insomnia and snoring.  Sleeping better now.  Increased stress recently - dealing with recent deaths in her family.  Discussed with her today.  Overall she feels she is handling things relatively well.  Does not feel needs any further intervention at this time.  Discussed recent labs and increased cholesterol.  Discussed calculated cholesterol risk and  recommendation for treatment.  She is agreeable to restart crestor.  Will take 3 days per week.     ROS: See pertinent positives and negatives per HPI.  Past Medical History:  Diagnosis Date   Allergy    Arthritis    Gastroesophageal reflux    Hiatal hernia 1989   status post Nissen fundoplication    Hx of hysterectomy    Hyperlipidemia    Tachycardia    Patient stated that this has been occuring frequently.   Thyroid disease    Goiter    Past Surgical History:  Procedure Laterality Date   ABDOMINAL HYSTERECTOMY  1980   partial   BREAST BIOPSY Right    neg   BREAST EXCISIONAL BIOPSY Left 2014   neg   COLONOSCOPY  2015   ESOPHAGOGASTRIC FUNDOPLICATION  1999   LAPAROSCOPIC NISSEN FUNDOPLICATION  1999   TONSILLECTOMY     as well as goiter   UPPER GI ENDOSCOPY  2015    Family History  Problem Relation Age of Onset   Stroke Mother 76   Arthritis Mother    Heart disease Mother    Hypertension Mother    Stroke Father 32   Hypertension Father    Rheumatic fever Brother        and multiple open heart surgeries   Diabetes Brother        type 2   Stroke Brother    Other Sister        Coronary Atherosclerosis   Stroke Brother    Stroke Sister  Heart disease Sister    Dementia Sister    Cancer Sister        ovarian    SOCIAL HX: reviewed.    Current Outpatient Medications:    Biotin 1000 MCG tablet, Take 1,000 mcg by mouth 3 (three) times daily. Reported on 05/13/2015, Disp: , Rfl:    Calcium Carbonate (CALCIUM 600 PO), Take by mouth daily. Reported on 05/13/2015, Disp: , Rfl:    Cholecalciferol (VITAMIN D PO), Take by mouth daily. Reported on 05/13/2015, Disp: , Rfl:    Epinastine HCl 0.05 % ophthalmic solution, Place 1 drop into both eyes 2 (two) times daily as needed., Disp: 10 mL, Rfl: 5   hydrocortisone 2.5 % cream, , Disp: , Rfl:    ketotifen (ALAWAY) 0.025 % ophthalmic solution, 1 drop 2 (two) times daily., Disp: , Rfl:     mometasone (NASONEX) 50 MCG/ACT nasal spray, 1 SPRAY 1-2 TIMES PER DAY AS NEEDED., Disp: 17 g, Rfl: 5   neomycin-polymyxin-dexamethasone (MAXITROL) 0.1 % ophthalmic suspension, Place 1 drop into both eyes 2 (two) times daily., Disp: , Rfl:    Olopatadine HCl (PAZEO) 0.7 % SOLN, Place 1 drop into both eyes daily., Disp: 1 Bottle, Rfl: 5   omeprazole (PRILOSEC) 20 MG capsule, TAKE 1 CAPSULE BY MOUTH EVERY DAY, Disp: 90 capsule, Rfl: 3   rosuvastatin (CRESTOR) 10 MG tablet, Take 1 tablet (10 mg total) by mouth daily., Disp: 90 tablet, Rfl: 1   vitamin B-12 (CYANOCOBALAMIN) 1000 MCG tablet, Take 1,000 mcg by mouth daily. Reported on 05/13/2015, Disp: , Rfl:   Current Facility-Administered Medications:    betamethasone acetate-betamethasone sodium phosphate (CELESTONE) injection 3 mg, 3 mg, Intramuscular, Once, Evans, Brent M, DPM   betamethasone acetate-betamethasone sodium phosphate (CELESTONE) injection 3 mg, 3 mg, Intramuscular, Once, Evans, Larena Glassman, DPM  EXAM:  VITALS per patient if applicable: 130/75 this am.   GENERAL: alert.  Answering questions appropriately.  Sounds to be in no acute distress.    PSYCH/NEURO: pleasant and cooperative, no obvious depression or anxiety, speech and thought processing grossly intact  ASSESSMENT AND PLAN:  Discussed the following assessment and plan:  Hypercholesterolemia - Plan: Hepatic function panel, CANCELED: Hepatic function panel  Allergic rhinitis, unspecified seasonality, unspecified trigger  Barrett's esophagus without dysplasia  Difficulty sleeping  Gastroesophageal reflux disease, esophagitis presence not specified  Palpitations  Stress  Vitamin D deficiency  Allergic rhinitis Stable on current regimen.   Barrett's esophagus S/p EGD 10/2016.  Saw Dr Troy Sine.  Omeprazole increased to bid last visit.  Doing better.  Has f/u 07/03/18.   Difficulty sleeping Sleeping better.  Follow.   GERD (gastroesophageal reflux  disease) On omeprazole bid now. Appears to be doing better.  Has f/u with GI 07/03/18.   Hypercholesterolemia Discussed recent cholesterol labs and need to treat. She agreed to restart crestor.  Follow lipid panel and liver function tests.    Palpitations Saw cardiology - last visit 05/31/18.  No further w/up felt warranted.  Doing better.  Follow.    Stress Increased stress as outlined.  Discussed with her today. s he does not feel needs any further intervention at this time. Follow.    Vitamin D deficiency Follow vitamin D level.     I discussed the assessment and treatment plan with the patient. The patient was provided an opportunity to ask questions and all were answered. The patient agreed with the plan and demonstrated an understanding of the instructions.   The patient was advised to call  back or seek an in-person evaluation if the symptoms worsen or if the condition fails to improve as anticipated.  I provided 25 minutes of non-face-to-face time during this encounter.   Dale Durhamharlene Charniece Venturino, MD

## 2018-07-01 ENCOUNTER — Encounter: Payer: Self-pay | Admitting: Internal Medicine

## 2018-07-01 NOTE — Assessment & Plan Note (Signed)
Stable on current regimen   

## 2018-07-01 NOTE — Assessment & Plan Note (Signed)
Increased stress as outlined.  Discussed with her today. s he does not feel needs any further intervention at this time. Follow.

## 2018-07-01 NOTE — Assessment & Plan Note (Signed)
On omeprazole bid now. Appears to be doing better.  Has f/u with GI 07/03/18.

## 2018-07-01 NOTE — Assessment & Plan Note (Signed)
Saw cardiology - last visit 05/31/18.  No further w/up felt warranted.  Doing better.  Follow.

## 2018-07-01 NOTE — Assessment & Plan Note (Signed)
Discussed recent cholesterol labs and need to treat. She agreed to restart crestor.  Follow lipid panel and liver function tests.

## 2018-07-01 NOTE — Assessment & Plan Note (Signed)
Sleeping better.  Follow.  

## 2018-07-01 NOTE — Assessment & Plan Note (Signed)
S/p EGD 10/2016.  Saw Dr Allyn Kenner.  Omeprazole increased to bid last visit.  Doing better.  Has f/u 07/03/18.

## 2018-07-01 NOTE — Assessment & Plan Note (Signed)
Follow vitamin D level.  

## 2018-07-03 ENCOUNTER — Other Ambulatory Visit: Payer: Self-pay

## 2018-07-03 ENCOUNTER — Encounter: Payer: Self-pay | Admitting: Gastroenterology

## 2018-07-03 ENCOUNTER — Ambulatory Visit (INDEPENDENT_AMBULATORY_CARE_PROVIDER_SITE_OTHER): Payer: Medicare Other | Admitting: Gastroenterology

## 2018-07-03 VITALS — BP 156/71 | HR 82 | Temp 97.9°F | Ht 63.0 in | Wt 141.0 lb

## 2018-07-03 DIAGNOSIS — R1013 Epigastric pain: Secondary | ICD-10-CM | POA: Diagnosis not present

## 2018-07-03 DIAGNOSIS — G8929 Other chronic pain: Secondary | ICD-10-CM

## 2018-07-03 NOTE — Progress Notes (Signed)
Gastroenterology Consultation  Referring Provider:     Dale DurhamScott, Charlene, MD Primary Care Physician:  Dale DurhamScott, Charlene, MD Primary Gastroenterologist:  Dr. Servando SnareWohl     Reason for Consultation:     GERD        HPI:   Carrie Noble is a 78 y.o. y/o female referred for consultation & management of GERD by Dr. Dale DurhamScott, Charlene, MD.  This patient comes to see me today after being [facility] by [facility], Dr. Bosie ClosSchooler at [facility] in [place], Dr. Marva PandaSkulskie at [facility] and Dr. Kinnie ScalesMedoff in [place].  The patient had a upper endoscopy by Dr. Theodore DemarkWild in October 2018.  That time the endoscopy showed findings consistent with a Nissen's fundoplication.  The patient was recently put on Prilosec twice a day for her symptoms.  The patient had reported to her PCP that the increase to omeprazole twice a day was helping her symptoms. The patient reports that when she lays down at night she has epigastric pain.  Her symptoms are not debilitating.  She also reports that she is no longer taking the Prilosec twice a day and is only taking it in the morning.  She states that she is concerned about side effects and mostly dementia.  The patient has been assured that dementia is not a side effect of PPIs. There is no report of any dysphasia or hematemesis.  She also denies any black stools or bloody stools.  She does report that if she had to do it over again she would not get the surgery for her reflux.  Past Medical History:  Diagnosis Date  . Allergy   . Arthritis   . Gastroesophageal reflux   . Hiatal hernia 1989   status post Nissen fundoplication   . Hx of hysterectomy   . Hyperlipidemia   . Tachycardia    Patient stated that this has been occuring frequently.  . Thyroid disease    Goiter    Past Surgical History:  Procedure Laterality Date  . ABDOMINAL HYSTERECTOMY  1980   partial  . BREAST BIOPSY Right    neg  . BREAST EXCISIONAL BIOPSY Left 2014   neg  . COLONOSCOPY  2015  .  ESOPHAGOGASTRIC FUNDOPLICATION  1999  . LAPAROSCOPIC NISSEN FUNDOPLICATION  1999  . TONSILLECTOMY     as well as goiter  . UPPER GI ENDOSCOPY  2015    Prior to Admission medications   Medication Sig Start Date End Date Taking? Authorizing Provider  Biotin 1000 MCG tablet Take 1,000 mcg by mouth 3 (three) times daily. Reported on 05/13/2015    [provider]  Calcium Carbonate (CALCIUM 600 PO) Take by mouth daily. Reported on 05/13/2015    [provider]  Cholecalciferol (VITAMIN D PO) Take by mouth daily. Reported on 05/13/2015    [provider]  Epinastine HCl 0.05 % ophthalmic solution Place 1 drop into both eyes 2 (two) times daily as needed. 08/15/17   Alfonse SpruceGallagher, Joel Louis, MD  hydrocortisone 2.5 % cream  01/09/18   [provider]  ketotifen (ALAWAY) 0.025 % ophthalmic solution 1 drop 2 (two) times daily.    [provider]  mometasone (NASONEX) 50 MCG/ACT nasal spray 1 SPRAY 1-2 TIMES PER DAY AS NEEDED. 05/13/15   Bobbitt, Heywood Ilesalph Carter, MD  neomycin-polymyxin-dexamethasone (MAXITROL) 0.1 % ophthalmic suspension Place 1 drop into both eyes 2 (two) times daily.    [provider]  Olopatadine HCl (PAZEO) 0.7 % SOLN  Place 1 drop into both eyes daily. 08/07/17   Alfonse SpruceGallagher, Joel Louis, MD  omeprazole (PRILOSEC) 20 MG capsule TAKE 1 CAPSULE BY MOUTH EVERY DAY 04/19/18   Dale DurhamScott, Charlene, MD  rosuvastatin (CRESTOR) 10 MG tablet Take 1 tablet (10 mg total) by mouth daily. 12/04/17   Dale DurhamScott, Charlene, MD  vitamin B-12 (CYANOCOBALAMIN) 1000 MCG tablet Take 1,000 mcg by mouth daily. Reported on 05/13/2015    [provider]    Family History  Problem Relation Age of Onset  . Stroke Mother 1984  . Arthritis Mother   . Heart disease Mother   . Hypertension Mother   . Stroke Father 2362  . Hypertension Father   . Rheumatic fever Brother        and multiple open heart surgeries  . Diabetes Brother        type 2  . Stroke Brother   .  Other Sister        Coronary Atherosclerosis  . Stroke Brother   . Stroke Sister   . Heart disease Sister   . Dementia Sister   . Cancer Sister        ovarian     Social History   Tobacco Use  . Smoking status: Never Smoker  . Smokeless tobacco: Never Used  Substance Use Topics  . Alcohol use: No    Alcohol/week: 0.0 standard drinks  . Drug use: No    Allergies as of 07/03/2018 - Review Complete 07/01/2018  Allergen Reaction Noted  . Darvocet [propoxyphene n-acetaminophen]  01/03/2011  . Morphine and related  01/03/2011    Review of Systems:    All systems reviewed and negative except where noted in HPI.   Physical Exam:  There were no vitals taken for this visit. No LMP recorded. Patient has had a hysterectomy. General:   Alert,  Well-developed, well-nourished, pleasant and cooperative in NAD Head:  Normocephalic and atraumatic. Eyes:  Sclera clear, no icterus.   Conjunctiva pink. Ears:  Normal auditory acuity. Nose:  No deformity, discharge, or lesions. Mouth:  No deformity or lesions,oropharynx pink & moist. Neck:  Supple; no masses or thyromegaly. Lungs:  Respirations even and unlabored.  Clear throughout to auscultation.   No wheezes, crackles, or rhonchi. No acute distress. Heart:  Regular rate and rhythm; no murmurs, clicks, rubs, or gallops. Abdomen:  Normal bowel sounds.  No bruits.  Soft, non-tender and non-distended without masses, hepatosplenomegaly or hernias noted.  No guarding or rebound tenderness.  Negative Carnett sign.   Rectal:  Deferred.  Msk:  Symmetrical without gross deformities.  Good, equal movement & strength bilaterally. Pulses:  Normal pulses noted. Extremities:  No clubbing or edema.  No cyanosis. Neurologic:  Alert and oriented x3;  grossly normal neurologically. Skin:  Intact without significant lesions or rashes.  No jaundice. Lymph Nodes:  No significant cervical adenopathy. Psych:  Alert and cooperative. Normal mood and affect.   Imaging Studies: No results found.  Assessment and Plan:   Carrie Noble is a 78 y.o. y/o female who comes in today with a history of a Nissen fundoplication.  The patient has seen multiple gastroneurologist in the past including Dr. Juanda ChanceBrodie, Dr. wild, Dr. Marva PandaSkulskie, Dr. Mechele CollinElliott, Dr. Bosie ClosSchooler and Dr. Kinnie ScalesMedoff.  The patient now is here to see me because she wants to establish care in town.  She has been told to take her omeprazole in the evening since most of her symptoms are when she lays down at night.  She has  been told that if that does not help her she should go back to taking the omeprazole twice a day.  The patient had a upper endoscopy in October 2018 without any significant findings.  There is no worrisome features to suggest a repeat upper endoscopy at this time.  The patient has been told to contact me if her symptoms do not improve.  Lucilla Lame, MD. Marval Regal    Note: This dictation was prepared with Dragon dictation along with smaller phrase technology. Any transcriptional errors that result from this process are unintentional.

## 2018-07-05 ENCOUNTER — Telehealth: Payer: Self-pay | Admitting: Internal Medicine

## 2018-07-05 DIAGNOSIS — H04123 Dry eye syndrome of bilateral lacrimal glands: Secondary | ICD-10-CM

## 2018-07-05 NOTE — Telephone Encounter (Signed)
Is there something you can recommend for dry eyes as long as there are no other acute symptoms? I will call pt for more info

## 2018-07-05 NOTE — Telephone Encounter (Signed)
If dry eyes, artificial tears.  Since she has tried other drops and no improvement, needs to see eye md for further testing to confirm etiology.  If agreeable, let me know and I will place the order for the referral.

## 2018-07-05 NOTE — Telephone Encounter (Signed)
Copied from [place] [id number]. Topic: Quick Communication - See Telephone Encounter >> Jul 05, 2018  2:27 PM Margot Ables wrote: CRM for notification. See Telephone encounter for: 07/05/18. Pt states her eyes are burning and stinging. She has tried pataday which helped temporarily and has tried drops for dry eye. She is asking that Dr. Nicki Reaper call her in something as she has been dealing with this for years.  [id number] - [place], [place] - [phone] (Phone) [phone] (Fax)

## 2018-07-06 NOTE — Telephone Encounter (Signed)
Spoke with pt and she stated that she does not need a referral with the insurance that she has but she will scheduled an eye appt. The pt also stated that she forgot to mention to you at her last appt that she is having some issues with the stiffness in her her legs and the plantar fasciitis in her feet again. The pt was wanting to know if she needed to follow up with rheumatology for the arthritis or the foot doctor.

## 2018-07-07 NOTE — Telephone Encounter (Signed)
I have placed the order for ophthalmology referral.  Someone should be contacting her with an appt.  If she has a preference of which eye doctor she wants to see, can let Melissa know.  Also, given persistent problems with plantar fasciitis, would recommend podiatry referral if she is agreeable.  Let stretches and staying active to help with leg stiffness, but can start with podiatry referral first.  Let me know if agreeable and I will place the order for the referral.

## 2018-07-09 NOTE — Telephone Encounter (Signed)
Spoke with pt and she stated that she was agreeable to seeing podiatry. Pt stated that she does not need a referral because she is already established if a podiatrist.

## 2018-08-08 ENCOUNTER — Other Ambulatory Visit: Payer: Self-pay

## 2018-08-08 ENCOUNTER — Other Ambulatory Visit (INDEPENDENT_AMBULATORY_CARE_PROVIDER_SITE_OTHER): Payer: Medicare Other

## 2018-08-08 DIAGNOSIS — E7800 Pure hypercholesterolemia, unspecified: Secondary | ICD-10-CM | POA: Diagnosis not present

## 2018-08-08 LAB — HEPATIC FUNCTION PANEL
ALT: 15 U/L (ref 0–35)
AST: 15 U/L (ref 0–37)
Albumin: 4.5 g/dL (ref 3.5–5.2)
Alkaline Phosphatase: 114 U/L (ref 39–117)
Bilirubin, Direct: 0.1 mg/dL (ref 0.0–0.3)
Total Bilirubin: 0.7 mg/dL (ref 0.2–1.2)
Total Protein: 6.8 g/dL (ref 6.0–8.3)

## 2018-08-09 ENCOUNTER — Other Ambulatory Visit: Payer: Self-pay | Admitting: Internal Medicine

## 2018-08-09 DIAGNOSIS — E7800 Pure hypercholesterolemia, unspecified: Secondary | ICD-10-CM

## 2018-08-09 DIAGNOSIS — R739 Hyperglycemia, unspecified: Secondary | ICD-10-CM

## 2018-08-09 NOTE — Progress Notes (Signed)
Order placed for f/u labs.  

## 2018-08-24 ENCOUNTER — Ambulatory Visit
Admission: RE | Admit: 2018-08-24 | Discharge: 2018-08-24 | Disposition: A | Discharge status: Home / Self Care | Payer: Medicare Other | Source: Ambulatory Visit | Attending: Internal Medicine | Admitting: Internal Medicine

## 2018-08-24 ENCOUNTER — Other Ambulatory Visit: Payer: Self-pay | Admitting: Internal Medicine

## 2018-08-24 DIAGNOSIS — R1013 Epigastric pain: Secondary | ICD-10-CM

## 2018-08-24 MED ORDER — IOPAMIDOL (ISOVUE-300) INJECTION 61%
100.0000 mL | Freq: Once | INTRAVENOUS | Status: AC | PRN
  Administered 2018-08-24: 100 mL via INTRAVENOUS

## 2018-09-10 ENCOUNTER — Telehealth: Payer: Self-pay | Admitting: *Deleted

## 2018-09-10 NOTE — Telephone Encounter (Signed)
Copied from [place]. Topic: General - Other >> Sep 10, 2018  2:42 PM Rainey Pines A wrote: Patient stated that she got a CT scan done ordered by  another provider at [facility] and wants to make sure Dr. Nicki Reaper received this information in regards to a spot on her liver. Patient would like a callback.

## 2018-09-10 NOTE — Telephone Encounter (Signed)
It looks like this scan is in epic.

## 2018-09-11 NOTE — Telephone Encounter (Signed)
Notify pt, we received scan report.  Also need Dr Shepard General notes.  (Gastroenterology in [place]).

## 2018-09-12 NOTE — Telephone Encounter (Signed)
Left detailed message for patient.

## 2018-09-14 ENCOUNTER — Other Ambulatory Visit: Payer: Self-pay | Admitting: Internal Medicine

## 2018-09-14 DIAGNOSIS — K769 Liver disease, unspecified: Secondary | ICD-10-CM

## 2018-09-18 ENCOUNTER — Ambulatory Visit
Admission: RE | Admit: 2018-09-18 | Discharge: 2018-09-18 | Disposition: A | Discharge status: Home / Self Care | Payer: Medicare Other | Source: Ambulatory Visit | Attending: Internal Medicine | Admitting: Internal Medicine

## 2018-09-18 ENCOUNTER — Other Ambulatory Visit: Payer: Self-pay | Admitting: Internal Medicine

## 2018-09-18 ENCOUNTER — Other Ambulatory Visit: Payer: Self-pay

## 2018-09-18 DIAGNOSIS — K769 Liver disease, unspecified: Secondary | ICD-10-CM

## 2018-09-18 MED ORDER — GADOBENATE DIMEGLUMINE 529 MG/ML IV SOLN
12.0000 mL | Freq: Once | INTRAVENOUS | Status: AC | PRN
  Administered 2018-09-18: 12:00:00 12 mL via INTRAVENOUS

## 2018-10-05 ENCOUNTER — Ambulatory Visit: Payer: Medicare Other | Admitting: Internal Medicine

## 2018-10-08 ENCOUNTER — Other Ambulatory Visit: Payer: Self-pay | Admitting: Internal Medicine

## 2018-10-08 DIAGNOSIS — Z1231 Encounter for screening mammogram for malignant neoplasm of breast: Secondary | ICD-10-CM

## 2018-11-05 ENCOUNTER — Other Ambulatory Visit: Payer: Self-pay

## 2018-11-06 ENCOUNTER — Ambulatory Visit (INDEPENDENT_AMBULATORY_CARE_PROVIDER_SITE_OTHER): Payer: Medicare Other | Admitting: Family Medicine

## 2018-11-06 ENCOUNTER — Encounter: Payer: Self-pay | Admitting: Family Medicine

## 2018-11-06 ENCOUNTER — Other Ambulatory Visit: Payer: Self-pay

## 2018-11-06 VITALS — BP 110/60 | HR 78 | Temp 97.6°F | Wt 140.0 lb

## 2018-11-06 DIAGNOSIS — R350 Frequency of micturition: Secondary | ICD-10-CM | POA: Diagnosis not present

## 2018-11-06 DIAGNOSIS — Z23 Encounter for immunization: Secondary | ICD-10-CM | POA: Diagnosis not present

## 2018-11-06 DIAGNOSIS — N3281 Overactive bladder: Secondary | ICD-10-CM | POA: Diagnosis not present

## 2018-11-06 LAB — POCT URINALYSIS DIP (MANUAL ENTRY)
Bilirubin, UA: NEGATIVE
Blood, UA: NEGATIVE
Glucose, UA: NEGATIVE mg/dL
Ketones, POC UA: NEGATIVE mg/dL
Leukocytes, UA: NEGATIVE
Nitrite, UA: NEGATIVE
Protein Ur, POC: NEGATIVE mg/dL
Spec Grav, UA: 1.01 (ref 1.010–1.025)
Urobilinogen, UA: 0.2 E.U./dL
pH, UA: 6.5 (ref 5.0–8.0)

## 2018-11-06 NOTE — Progress Notes (Signed)
Subjective:    Patient ID: Carrie Noble, female    DOB: 1940/04/15, 78 y.o.   MRN: [id number]  HPI  Patient presents to clinic complaining of urinating multiple times at night.  This is not a new problem.  Patient states she has had this for at least 1 or 2 years.  Patient states she has gone to physical therapy for pelvic floor training, but stopped going due to not believing it helped.  Patient also has taken over-the-counter Azo on occasion with some help and symptom relief, but nothing seems to resolve the issue.  No pain with urination.  No blood in urine.  No foul smell to urine.  No abdominal pain, vomiting or diarrhea.  Denies feeling any prolapse of female organs.  Patient Active Problem List   Diagnosis Date Noted  . Vaginal burning 03/17/2018  . Pelvic pain 03/17/2018  . Osteoarthritis 12/13/2017  . 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 Chest pain 12/03/2017  . Vitamin D deficiency 11/30/2017  . Leg weakness 11/30/2017  . Benign essential HTN 10/24/2016  . Stable angina pectoris (HCC) 10/24/2016  . Stress 09/04/2016  . Urge incontinence 09/04/2016  . Headache 02/16/2016  . Dry eyes 02/16/2016  . Acute bronchitis 11/26/2015  . Memory change 10/11/2015  . Allergic rhinitis 05/06/2015  . Breast tenderness in female 09/14/2014  . Pain in right hip 07/14/2014  . Carotid bruit 07/14/2014  . Difficulty sleeping 03/30/2014  . Health care maintenance 03/30/2014  . Diverticulosis 03/27/2013  . Chronic cystitis 12/31/2012  . 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 Abnormal mammogram 09/06/2012  . Osteoporosis 05/29/2012  . Thyroid goiter 05/28/2012  . Barrett's esophagus 05/28/2012  . Hypercholesterolemia 01/03/2011  . Palpitations 01/03/2011  . GERD (gastroesophageal reflux disease) 01/03/2011   Social History   Tobacco Use  . Smoking status: Never Smoker  . Smokeless tobacco: Never Used  Substance Use Topics  . Alcohol use: No   Alcohol/week: 0.0 standard drinks   Review of Systems  Constitutional: Negative for chills, fatigue and fever.  HENT: Negative for congestion, ear pain, sinus pain and sore throat.   Eyes: Negative.   Respiratory: Negative for cough, shortness of breath and wheezing.   Cardiovascular: Negative for chest pain, palpitations and leg swelling.  Gastrointestinal: Negative for abdominal pain, diarrhea, nausea and vomiting.  Genitourinary: Negative for dysuria, urgency. +frequency Musculoskeletal: Negative for arthralgias and myalgias.  Skin: Negative for color change, pallor and rash.  Neurological: Negative for syncope, light-headedness and headaches.  Psychiatric/Behavioral: The patient is not nervous/anxious.       Objective:   Physical Exam Vitals signs and nursing note reviewed.  Constitutional:      General: She is not in acute distress.    Appearance: She is normal weight. She is not ill-appearing, toxic-appearing or diaphoretic.  HENT:     Head: Normocephalic and atraumatic.  Cardiovascular:     Rate and Rhythm: Normal rate and regular rhythm.  Pulmonary:     Effort: Pulmonary effort is normal. No respiratory distress.     Breath sounds: Normal breath sounds.  Abdominal:     General: Bowel sounds are normal. There is no distension.     Palpations: Abdomen is soft. There is no mass.     Tenderness: There is no abdominal tenderness. There is no right CVA tenderness, left CVA tenderness, guarding or rebound.  Musculoskeletal:     Right lower leg: No edema.     Left lower leg: No edema.  Skin:    General: Skin is warm and dry.     Coloration: Skin is not jaundiced or pale.  Neurological:     Mental Status: She is alert.     Gait: Gait normal.  Psychiatric:        Mood and Affect: Mood normal.        Behavior: Behavior normal.    Today's Vitals   11/06/18 1026  BP: 110/60  Pulse: 78  Temp: 97.6 F (36.4 C)  SpO2: 97%  Weight: 140 lb (63.5 kg)   Body mass index is  24.8 kg/m.     Assessment & Plan:    Urinary frequency/overactive bladder-patient request we test her urine to rule out UTI.  We will send urine for culture and do UA dipstick in clinic.  Long discussion with patient in regards to due to the length of time with her symptoms I feel it is more so related to an overactive bladder or some bladder droppage.  Discussed if medications we can try including Myrbetriq and Ditropan, patient declines due to concerns over side effects including dry mouth.  Patient agreeable to urology referral.  Also discussed retrying physical therapy for pelvic floor strengthening, does not want to go back to therapy at this time.  Patient will keep regular follow-up with PCP as planned and return to clinic sooner if any issues arise.

## 2018-11-08 LAB — URINE CULTURE
MICRO NUMBER:: 983952
SPECIMEN QUALITY:: ADEQUATE

## 2018-12-03 ENCOUNTER — Ambulatory Visit (INDEPENDENT_AMBULATORY_CARE_PROVIDER_SITE_OTHER): Payer: Medicare Other | Admitting: Urology

## 2018-12-03 ENCOUNTER — Other Ambulatory Visit: Payer: Self-pay

## 2018-12-03 ENCOUNTER — Encounter: Payer: Self-pay | Admitting: Urology

## 2018-12-03 VITALS — BP 138/78 | HR 88 | Ht 63.0 in | Wt 139.0 lb

## 2018-12-03 DIAGNOSIS — R350 Frequency of micturition: Secondary | ICD-10-CM | POA: Diagnosis not present

## 2018-12-03 DIAGNOSIS — R351 Nocturia: Secondary | ICD-10-CM

## 2018-12-03 LAB — MICROSCOPIC EXAMINATION: RBC, Urine: NONE SEEN /hpf (ref 0–2)

## 2018-12-03 LAB — URINALYSIS, COMPLETE
Bilirubin, UA: NEGATIVE
Glucose, UA: NEGATIVE
Ketones, UA: NEGATIVE
Leukocytes,UA: NEGATIVE
Nitrite, UA: NEGATIVE
Protein,UA: NEGATIVE
Specific Gravity, UA: >1.03 — ABNORMAL HIGH (ref 1.005–1.030)
Urobilinogen, Ur: 0.2 mg/dL (ref 0.2–1.0)
pH, UA: 5.5 (ref 5.0–7.5)

## 2018-12-03 MED ORDER — OXYBUTYNIN CHLORIDE ER 10 MG PO TB24: 10.0000 mg | ORAL_TABLET | Freq: Every day | ORAL | 11 refills | Status: DC

## 2018-12-03 NOTE — Progress Notes (Signed)
12/03/2018 11:48 AM   Carrie Noble 07-08-1940 [id number]  Referring provider: Tracey HarriesGuse, Lauren M, FNP [address] [place],  [place] [id number]  Chief Complaint  Patient presents with  . Urinary Frequency    New Patient    HPI: I was consulted to assist the patient's worsening 5-year history of nighttime frequency.  When she cannot sleep she voids every 30 minutes and other times twice.  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  I do not think she has bedwetting.  She has uncommon mild urge incontinence during the day.  She probably had a urethral dilation years ago.  She normally voids about every 3 or 4 hours during the day and does well otherwise.  It turns out she was a partial responder to Myrbetriq and she failed physical therapy  No history of kidney stones and bladder surgery.  No significant infection history  Modifying factors: There are no other modifying factors  Associated signs and symptoms: There are no other associated signs and symptoms Aggravating and relieving factors: There are no other aggravating or relieving factors Severity: Moderate Duration: Persistent    PMH: Past Medical History:  Diagnosis Date  . Allergy   . Arthritis   . Gastroesophageal reflux   . Hiatal hernia 1989   status post Nissen fundoplication   . Hx of hysterectomy   . Hyperlipidemia   . Tachycardia    Patient stated that this has been occuring frequently.  . Thyroid disease    Goiter    Surgical History: Past Surgical History:  Procedure Laterality Date  . ABDOMINAL HYSTERECTOMY  1980   partial  . BREAST BIOPSY Right    neg  . BREAST EXCISIONAL BIOPSY Left 2014   neg  . COLONOSCOPY  2015  . ESOPHAGOGASTRIC FUNDOPLICATION  1999  . LAPAROSCOPIC NISSEN FUNDOPLICATION  1999  . TONSILLECTOMY     as well as goiter  . UPPER GI ENDOSCOPY  2015    Home Medications:  Allergies as of 12/03/2018      Reactions   Darvocet [propoxyphene N-acetaminophen]    Patient stated that medication made her heart beat fast.   Morphine And Related    Patient stated that medication made her heart beat fast.      Medication List       Accurate as of December 03, 2018 11:48 AM. If you have any questions, ask your nurse or doctor.        Alaway 0.025 % ophthalmic solution Generic drug: ketotifen 1 drop 2 (two) times daily.   Biotin 1000 MCG tablet Take 1,000 mcg by mouth 3 (three) times daily. Reported on 05/13/2015   CALCIUM 600 PO Take by mouth daily. Reported on 05/13/2015   Epinastine HCl 0.05 % ophthalmic solution Place 1 drop into both eyes 2 (two) times daily as needed.   hydrocortisone 2.5 % cream   mometasone 50 MCG/ACT nasal spray Commonly known as: NASONEX 1 SPRAY 1-2 TIMES PER DAY AS NEEDED.   neomycin-polymyxin-dexamethasone 0.1 % ophthalmic suspension Commonly known as: MAXITROL Place 1 drop into both eyes 2 (two) times daily.   Olopatadine HCl 0.7 % Soln Commonly known as: Pazeo Place 1 drop into both eyes daily.   omeprazole 20 MG capsule Commonly known as: PRILOSEC TAKE 1 CAPSULE BY MOUTH EVERY DAY   oxybutynin 10 MG 24 hr tablet Commonly known as: DITROPAN-XL Take 1 tablet (10 mg total) by mouth daily. Started by: Martina SinnerScott A Mayson Sterbenz, MD  rosuvastatin 10 MG tablet Commonly known as: Crestor Take 1 tablet (10 mg total) by mouth daily.   vitamin B-12 1000 MCG tablet Commonly known as: CYANOCOBALAMIN Take 1,000 mcg by mouth daily. Reported on 05/13/2015   VITAMIN D PO Take by mouth daily. Reported on 05/13/2015       Allergies:  Allergies  Allergen Reactions  . Darvocet [Propoxyphene N-Acetaminophen]     Patient stated that medication made her heart beat fast.  . Morphine And Related     Patient stated that medication made her heart beat fast.    Family History: Family History  Problem Relation Age of Onset  . Stroke Mother 16  . Arthritis Mother   . Heart disease Mother   . Hypertension Mother   .  Stroke Father 33  . Hypertension Father   . Rheumatic fever Brother        and multiple open heart surgeries  . Diabetes Brother        type 2  . Stroke Brother   . Other Sister        Coronary Atherosclerosis  . Stroke Brother   . Stroke Sister   . Heart disease Sister   . Dementia Sister   . Cancer Sister        ovarian    Social History:  reports that she has never smoked. She has never used smokeless tobacco. She reports that she does not drink alcohol or use drugs.  ROS:                                        Physical Exam: BP 138/78   Pulse 88   Ht 5\' 3"  (1.6 m)   Wt 139 lb (63 kg)   BMI 24.62 kg/m   Constitutional:  Alert and oriented, No acute distress. HEENT: [place] AT, moist mucus membranes.  Trachea midline, no masses. Cardiovascular: No clubbing, cyanosis, or edema. Respiratory: Normal respiratory effort, no increased work of breathing. GI: Abdomen is soft, nontender, nondistended, no abdominal masses GU: No CVA tenderness.  No bladder tenderness Skin: No rashes, bruises or suspicious lesions. Lymph: No cervical or inguinal adenopathy. Neurologic: Grossly intact, no focal deficits, moving all 4 extremities. Psychiatric: Normal mood and affect.  Laboratory Data: Lab Results  Component Value Date   WBC 4.9 11/30/2017   HGB 13.7 11/30/2017   HCT 40.1 11/30/2017   MCV 93.3 11/30/2017   PLT 354.0 11/30/2017    Lab Results  Component Value Date   CREATININE 0.99 06/21/2018    No results found for: PSA  No results found for: TESTOSTERONE  Lab Results  Component Value Date   HGBA1C 5.7 03/29/2013    Urinalysis    Component Value Date/Time   COLORURINE YELLOW 01/15/2018 1437   APPEARANCEUR CLEAR 01/15/2018 1437   LABSPEC 1.025 01/15/2018 1437   PHURINE 5.5 01/15/2018 1437   GLUCOSEU NEGATIVE 01/15/2018 1437   HGBUR NEGATIVE 01/15/2018 1437   BILIRUBINUR negative 11/06/2018 1139   BILIRUBINUR Negative 03/13/2018 1513    KETONESUR negative 11/06/2018 1139   KETONESUR NEGATIVE 01/15/2018 1437   PROTEINUR negative 11/06/2018 1139   PROTEINUR Negative 03/13/2018 1513   UROBILINOGEN 0.2 11/06/2018 1139   UROBILINOGEN 0.2 01/15/2018 1437   NITRITE Negative 11/06/2018 1139   NITRITE negative 03/13/2018 1513   NITRITE NEGATIVE 01/15/2018 1437   LEUKOCYTESUR Negative 11/06/2018 1139    Pertinent Imaging:  Assessment & Plan:  Patient has mild mixed incontinence.  Primary complaint is worsening nocturia according to sleep pattern.  She likely has a nocturnal diuresis.  She may have a little bit of left ankle edema.  Reassess on OXY ER10 mg in 6 weeks.  Desmopressin a distant option.  Percutaneous tibial nerve stimulation option  There are no diagnoses linked to this encounter.  Return in about 6 weeks (around 01/14/2019) for 6wk follow up.  Reece Packer, MD  [facility] [address] [place], [place] [address] [phone]

## 2018-12-05 ENCOUNTER — Other Ambulatory Visit: Payer: Self-pay

## 2018-12-05 LAB — CULTURE, URINE COMPREHENSIVE

## 2018-12-07 ENCOUNTER — Other Ambulatory Visit: Payer: Medicare Other

## 2018-12-11 ENCOUNTER — Encounter: Payer: Medicare Other | Admitting: Internal Medicine

## 2018-12-12 ENCOUNTER — Ambulatory Visit
Admission: RE | Admit: 2018-12-12 | Discharge: 2018-12-12 | Disposition: A | Discharge status: Home / Self Care | Payer: Medicare Other | Source: Ambulatory Visit | Attending: Internal Medicine | Admitting: Internal Medicine

## 2018-12-12 DIAGNOSIS — Z1231 Encounter for screening mammogram for malignant neoplasm of breast: Secondary | ICD-10-CM | POA: Diagnosis present

## 2018-12-31 ENCOUNTER — Telehealth: Payer: Self-pay | Admitting: Urology

## 2018-12-31 NOTE — Telephone Encounter (Signed)
She can try Vesicare 5 mg

## 2018-12-31 NOTE — Telephone Encounter (Signed)
Informed patient-she declined new prescription-would rather wait to discuss at follow up appointment.

## 2018-12-31 NOTE — Telephone Encounter (Signed)
Pt. Sates she has not been taking medication Oxybutynin as prescribed by Dr. Matilde Sprang. Pt. States the medication causes her dry mouth and she cant sleep. Pt. States she is not sure if she should keep her upcoming appointment on 01/07/19 or just cancel because she does not want to continue this medication. I encouraged pt. To keep appointment scheduled until she speaks to clinical about the issues she is having. Please call pt. To discuss.

## 2019-01-14 ENCOUNTER — Ambulatory Visit: Payer: Medicare Other | Admitting: Urology

## 2019-01-22 ENCOUNTER — Other Ambulatory Visit: Payer: Self-pay

## 2019-01-28 ENCOUNTER — Other Ambulatory Visit (INDEPENDENT_AMBULATORY_CARE_PROVIDER_SITE_OTHER): Payer: Medicare Other

## 2019-01-28 ENCOUNTER — Other Ambulatory Visit: Payer: Self-pay

## 2019-01-28 DIAGNOSIS — E7800 Pure hypercholesterolemia, unspecified: Secondary | ICD-10-CM

## 2019-01-28 DIAGNOSIS — R739 Hyperglycemia, unspecified: Secondary | ICD-10-CM

## 2019-01-28 LAB — HEPATIC FUNCTION PANEL
ALT: 13 U/L (ref 0–35)
AST: 18 U/L (ref 0–37)
Albumin: 4.3 g/dL (ref 3.5–5.2)
Alkaline Phosphatase: 125 U/L — ABNORMAL HIGH (ref 39–117)
Bilirubin, Direct: 0.1 mg/dL (ref 0.0–0.3)
Total Bilirubin: 0.5 mg/dL (ref 0.2–1.2)
Total Protein: 7.2 g/dL (ref 6.0–8.3)

## 2019-01-28 LAB — LIPID PANEL
Cholesterol: 239 mg/dL — ABNORMAL HIGH (ref 0–200)
HDL: 52.9 mg/dL (ref >39.00)
LDL Cholesterol: 150 mg/dL — ABNORMAL HIGH (ref 0–99)
NonHDL: 186.47
Total CHOL/HDL Ratio: 5
Triglycerides: 183 mg/dL — ABNORMAL HIGH (ref 0.0–149.0)
VLDL: 36.6 mg/dL (ref 0.0–40.0)

## 2019-01-28 LAB — BASIC METABOLIC PANEL
BUN: 14 mg/dL (ref 6–23)
CO2: 28 mEq/L (ref 19–32)
Calcium: 9.6 mg/dL (ref 8.4–10.5)
Chloride: 102 mEq/L (ref 96–112)
Creatinine, Ser: 0.88 mg/dL (ref 0.40–1.20)
GFR: 61.87 mL/min (ref >60.00)
Glucose, Bld: 96 mg/dL (ref 70–99)
Potassium: 4.3 mEq/L (ref 3.5–5.1)
Sodium: 138 mEq/L (ref 135–145)

## 2019-01-28 LAB — HEMOGLOBIN A1C: Hgb A1c MFr Bld: 5.5 % (ref 4.6–6.5)

## 2019-01-29 ENCOUNTER — Ambulatory Visit (INDEPENDENT_AMBULATORY_CARE_PROVIDER_SITE_OTHER): Payer: Medicare Other | Admitting: Internal Medicine

## 2019-01-29 ENCOUNTER — Telehealth: Payer: Self-pay | Admitting: Internal Medicine

## 2019-01-29 DIAGNOSIS — G479 Sleep disorder, unspecified: Secondary | ICD-10-CM | POA: Diagnosis not present

## 2019-01-29 DIAGNOSIS — E7800 Pure hypercholesterolemia, unspecified: Secondary | ICD-10-CM

## 2019-01-29 DIAGNOSIS — K219 Gastro-esophageal reflux disease without esophagitis: Secondary | ICD-10-CM

## 2019-01-29 DIAGNOSIS — K2270 Barrett's esophagus without dysplasia: Secondary | ICD-10-CM | POA: Diagnosis not present

## 2019-01-29 DIAGNOSIS — R002 Palpitations: Secondary | ICD-10-CM

## 2019-01-29 DIAGNOSIS — F439 Reaction to severe stress, unspecified: Secondary | ICD-10-CM

## 2019-01-29 MED ORDER — PANTOPRAZOLE SODIUM 40 MG PO TBEC: DELAYED_RELEASE_TABLET | ORAL | 2 refills | Status: DC

## 2019-01-29 NOTE — Telephone Encounter (Signed)
Lm to schedule a follow up appt in 6-8wks

## 2019-01-29 NOTE — Progress Notes (Signed)
Virtual Visit via telephone Note  This visit type was conducted due to national recommendations for restrictions regarding the COVID-19 pandemic (e.g. social distancing).  This format is felt to be most appropriate for this patient at this time.  All issues noted in this document were discussed and addressed.  No physical exam was performed (except for noted visual exam findings with Video Visits).   I connected with Carrie Noble by a video enabled telemedicine application by telephone and verified that I am speaking with the correct person using two identifiers. Location patient: home Location provider: work  Persons participating in the telephone visit: patient, provider  I discussed the limitations, risks, security and privacy concerns of performing an evaluation and management service by telephone and the availability of in person appointments. The patient expressed understanding and agreed to proceed.   Reason for visit: scheduled follow up.   HPI: She reports she is doing relatively well.  Reports she is not sleeping well.  Feels is related to increased indigestion/acid reflux.  Saw Dr Allyn Kenner 09/05/18 with persistent heartburn and RUQ pain - burning pain under right breast.  CT 08/24/18 - focal hypodense lesion in the right posterior hepatic lobe felt to be probable focal fat collection.  He recommended MRI and continued prilosec and TUMS at night.  MRI - fatty inflitration with no evidence of neoplasia.  She feels reflux/indigestion symptoms have progressed - affecting her sleep.  Also reports some nocturia and some occasional incontinence.  Has f/u with urology next week.  Has dry mouth.  Medication for bladder aggravated.  She is eating.  No nausea or vomiting.  Breathing stable.  No chest pain with increased activity or exertion.  Stress she feels is better.  Desires no further intervention.  Legs are better.  Off crestor due to intolerance.  Declines statin medication.  Discussed changing  prilosec.  Discussed f/u with GI.  She declines.     ROS: See pertinent positives and negatives per HPI.  Past Medical History:  Diagnosis Date  . Allergy   . Arthritis   . Gastroesophageal reflux   . Hiatal hernia 1989   status post Nissen fundoplication   . Hx of hysterectomy   . Hyperlipidemia   . Tachycardia    Patient stated that this has been occuring frequently.  . Thyroid disease    Goiter    Past Surgical History:  Procedure Laterality Date  . ABDOMINAL HYSTERECTOMY  1980   partial  . BREAST BIOPSY Right    neg  . BREAST EXCISIONAL BIOPSY Left 2014   neg  . COLONOSCOPY  2015  . ESOPHAGOGASTRIC FUNDOPLICATION  7322  . LAPAROSCOPIC NISSEN FUNDOPLICATION  0254  . TONSILLECTOMY     as well as goiter  . UPPER GI ENDOSCOPY  2015    Family History  Problem Relation Age of Onset  . Stroke Mother 55  . Arthritis Mother   . Heart disease Mother   . Hypertension Mother   . Stroke Father 60  . Hypertension Father   . Rheumatic fever Brother        and multiple open heart surgeries  . Diabetes Brother        type 2  . Stroke Brother   . Other Sister        Coronary Atherosclerosis  . Stroke Brother   . Stroke Sister   . Heart disease Sister   . Dementia Sister   . Cancer Sister  ovarian    SOCIAL HX: reviewed.    Current Outpatient Medications:  .  Biotin 1000 MCG tablet, Take 1,000 mcg by mouth 3 (three) times daily. Reported on 05/13/2015, Disp: , Rfl:  .  Calcium Carbonate (CALCIUM 600 PO), Take by mouth daily. Reported on 05/13/2015, Disp: , Rfl:  .  Cholecalciferol (VITAMIN D PO), Take by mouth daily. Reported on 05/13/2015, Disp: , Rfl:  .  Epinastine HCl 0.05 % ophthalmic solution, Place 1 drop into both eyes 2 (two) times daily as needed., Disp: 10 mL, Rfl: 5 .  hydrocortisone 2.5 % cream, , Disp: , Rfl:  .  ketotifen (ALAWAY) 0.025 % ophthalmic solution, 1 drop 2 (two) times daily., Disp: , Rfl:  .  mometasone (NASONEX) 50 MCG/ACT nasal  spray, 1 SPRAY 1-2 TIMES PER DAY AS NEEDED., Disp: 17 g, Rfl: 5 .  neomycin-polymyxin-dexamethasone (MAXITROL) 0.1 % ophthalmic suspension, Place 1 drop into both eyes 2 (two) times daily., Disp: , Rfl:  .  Olopatadine HCl (PAZEO) 0.7 % SOLN, Place 1 drop into both eyes daily., Disp: 1 Bottle, Rfl: 5 .  pantoprazole (PROTONIX) 40 MG tablet, Take one tablet 30 minutes before your evening meal, Disp: 30 tablet, Rfl: 2 .  rosuvastatin (CRESTOR) 10 MG tablet, Take 1 tablet (10 mg total) by mouth daily., Disp: 90 tablet, Rfl: 1 .  vitamin B-12 (CYANOCOBALAMIN) 1000 MCG tablet, Take 1,000 mcg by mouth daily. Reported on 05/13/2015, Disp: , Rfl:   Current Facility-Administered Medications:  .  betamethasone acetate-betamethasone sodium phosphate (CELESTONE) injection 3 mg, 3 mg, Intramuscular, Once, Evans, Brent M, DPM .  betamethasone acetate-betamethasone sodium phosphate (CELESTONE) injection 3 mg, 3 mg, Intramuscular, Once, Evans, Larena Glassman, DPM  EXAM:  GENERAL: alert.  Sounds to be in no acute distress.  Answering questions appropriately.    PSYCH/NEURO: pleasant and cooperative, no obvious depression or anxiety, speech and thought processing grossly intact  ASSESSMENT AND PLAN:  Discussed the following assessment and plan:  Barrett's esophagus Recently evaluated by Dr Troy Sine.  Recommended continuing prilosec 20mg  q day. With increased indigestion as outlined. Discussed referral back.  She declines.  Change to protonix.  Follow.    Difficulty sleeping Worsening recently as outlined.  Discussed with her today.  Discussed possible sleep apnea.  She declines further evaluation for this.  Feels if treat indigestion, will improve.  Will stop prilosec.  Start protonix.  Don't eat just prior to bed, etc.  Follow closely.  Notify me if persistent.    GERD (gastroesophageal reflux disease) Worsening problems recently.  Declines f/u with GI.  Change to protonix.  Schedule f/u soon.    Hypercholesterolemia Off crestor. She declines to restart or try another statin medication.  Low cholesterol diet and exercise.  Follow lipid panel.   Palpitations Previously saw cardiology.  Overall feels stable.  Follow.    Stress Feels stress is better.  Discussed with her today.  Desires no further intervention.  Follow.     No orders of the defined types were placed in this encounter.   Meds ordered this encounter  Medications  . pantoprazole (PROTONIX) 40 MG tablet    Sig: Take one tablet 30 minutes before your evening meal    Dispense:  30 tablet    Refill:  2     I discussed the assessment and treatment plan with the patient. The patient was provided an opportunity to ask questions and all were answered. The patient agreed with the plan and demonstrated an understanding  of the instructions.   The patient was advised to call back or seek an in-person evaluation if the symptoms worsen or if the condition fails to improve as anticipated.  I provided 25 minutes of non-face-to-face time during this encounter.   Einar Pheasant, MD

## 2019-01-30 ENCOUNTER — Telehealth: Payer: Self-pay | Admitting: Internal Medicine

## 2019-01-30 NOTE — Telephone Encounter (Signed)
Pt has question about protonix. Pt would like a call back before tomorrow. Health nurse from [facility] is going to her house tomorrow. Please advise.

## 2019-01-31 ENCOUNTER — Telehealth: Payer: Self-pay | Admitting: Internal Medicine

## 2019-01-31 NOTE — Telephone Encounter (Signed)
Pt needs prior auth [id number] for pantoprazole (PROTONIX) 40 MG tablet, Pt is having acid refulx. Please call pt when complete. Thanks

## 2019-02-01 NOTE — Telephone Encounter (Signed)
See other note about protonix

## 2019-02-03 ENCOUNTER — Encounter: Payer: Self-pay | Admitting: Internal Medicine

## 2019-02-03 NOTE — Assessment & Plan Note (Signed)
Previously saw cardiology.  Overall feels stable.  Follow.

## 2019-02-03 NOTE — Assessment & Plan Note (Signed)
Worsening recently as outlined.  Discussed with her today.  Discussed possible sleep apnea.  She declines further evaluation for this.  Feels if treat indigestion, will improve.  Will stop prilosec.  Start protonix.  Don't eat just prior to bed, etc.  Follow closely.  Notify me if persistent.

## 2019-02-03 NOTE — Assessment & Plan Note (Signed)
Recently evaluated by Dr Troy Sine.  Recommended continuing prilosec 20mg  q day. With increased indigestion as outlined. Discussed referral back.  She declines.  Change to protonix.  Follow.

## 2019-02-03 NOTE — Assessment & Plan Note (Signed)
Worsening problems recently.  Declines f/u with GI.  Change to protonix.  Schedule f/u soon.

## 2019-02-03 NOTE — Assessment & Plan Note (Signed)
Feels stress is better.  Discussed with her today.  Desires no further intervention.  Follow.

## 2019-02-03 NOTE — Assessment & Plan Note (Signed)
Off crestor. She declines to restart or try another statin medication.  Low cholesterol diet and exercise.  Follow lipid panel.

## 2019-02-04 ENCOUNTER — Ambulatory Visit (INDEPENDENT_AMBULATORY_CARE_PROVIDER_SITE_OTHER): Payer: Medicare Other | Admitting: Urology

## 2019-02-04 ENCOUNTER — Other Ambulatory Visit: Payer: Self-pay

## 2019-02-04 VITALS — BP 120/74 | HR 84 | Ht 64.0 in | Wt 141.0 lb

## 2019-02-04 DIAGNOSIS — N3946 Mixed incontinence: Secondary | ICD-10-CM | POA: Diagnosis not present

## 2019-02-04 NOTE — Progress Notes (Signed)
02/04/2019 9:13 AM   Carrie Noble 1940-10-07 [id number]  Referring provider: Einar Pheasant, [address] [place],  [place] [address]  No chief complaint on file.   HPI: I was consulted to assist the patient's worsening 5-year history of nighttime frequency.  When she cannot sleep she voids every 30 minutes and other times twice.  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  I do not think she has bedwetting.  She has uncommon mild urge incontinence during the day.  She normally voids about every 3 or 4 hours during the day and does well otherwise.  It turns out she was a partial responder to Myrbetriq and she failed physical therapy  Patient has mild mixed incontinence.  Primary complaint is worsening nocturia according to sleep pattern.  She likely has a nocturnal diuresis.  She may have a little bit of left ankle edema.  Reassess on OXY ER10 mg in 6 weeks.  Desmopressin a distant option.  Percutaneous tibial nerve stimulation option  Today Frequency is stable.  I reviewed the chart and she had a side effect from the oxybutynin and did not want to take Vesicare.  She does have some urgency and mild foot on the floor syndrome  We talked about percutaneous tibial nerve stimulation in detail.  Handout given.  She would like to proceed.  Clinically not infected today.  Nighttime frequency stable     PMH: Past Medical History:  Diagnosis Date  . Allergy   . Arthritis   . Gastroesophageal reflux   . Hiatal hernia 1989   status post Nissen fundoplication   . Hx of hysterectomy   . Hyperlipidemia   . Tachycardia    Patient stated that this has been occuring frequently.  . Thyroid disease    Goiter    Surgical History: Past Surgical History:  Procedure Laterality Date  . ABDOMINAL HYSTERECTOMY  1980   partial  . BREAST BIOPSY Right    neg  . BREAST EXCISIONAL BIOPSY Left 2014   neg  . COLONOSCOPY  2015  . ESOPHAGOGASTRIC FUNDOPLICATION   7412  . LAPAROSCOPIC NISSEN FUNDOPLICATION  8786  . TONSILLECTOMY     as well as goiter  . UPPER GI ENDOSCOPY  2015    Home Medications:  Allergies as of 02/04/2019      Reactions   Darvocet [propoxyphene N-acetaminophen]    Patient stated that medication made her heart beat fast.   Morphine And Related    Patient stated that medication made her heart beat fast.      Medication List       Accurate as of February 04, 2019  9:13 AM. If you have any questions, ask your nurse or doctor.        Alaway 0.025 % ophthalmic solution Generic drug: ketotifen 1 drop 2 (two) times daily.   Biotin 1000 MCG tablet Take 1,000 mcg by mouth 3 (three) times daily. Reported on 05/13/2015   CALCIUM 600 PO Take by mouth daily. Reported on 05/13/2015   Epinastine HCl 0.05 % ophthalmic solution Place 1 drop into both eyes 2 (two) times daily as needed.   hydrocortisone 2.5 % cream   mometasone 50 MCG/ACT nasal spray Commonly known as: NASONEX 1 SPRAY 1-2 TIMES PER DAY AS NEEDED.   neomycin-polymyxin-dexamethasone 0.1 % ophthalmic suspension Commonly known as: MAXITROL Place 1 drop into both eyes 2 (two) times daily.   Olopatadine HCl 0.7 % Soln Commonly known as: Biomedical scientist 1  drop into both eyes daily.   pantoprazole 40 MG tablet Commonly known as: Protonix Take one tablet 30 minutes before your evening meal   rosuvastatin 10 MG tablet Commonly known as: Crestor Take 1 tablet (10 mg total) by mouth daily.   vitamin B-12 1000 MCG tablet Commonly known as: CYANOCOBALAMIN Take 1,000 mcg by mouth daily. Reported on 05/13/2015   VITAMIN D PO Take by mouth daily. Reported on 05/13/2015       Allergies:  Allergies  Allergen Reactions  . Darvocet [Propoxyphene N-Acetaminophen]     Patient stated that medication made her heart beat fast.  . Morphine And Related     Patient stated that medication made her heart beat fast.    Family History: Family History  Problem Relation Age  of Onset  . Stroke Mother 47  . Arthritis Mother   . Heart disease Mother   . Hypertension Mother   . Stroke Father 31  . Hypertension Father   . Rheumatic fever Brother        and multiple open heart surgeries  . Diabetes Brother        type 2  . Stroke Brother   . Other Sister        Coronary Atherosclerosis  . Stroke Brother   . Stroke Sister   . Heart disease Sister   . Dementia Sister   . Cancer Sister        ovarian    Social History:  reports that she has never smoked. She has never used smokeless tobacco. She reports that she does not drink alcohol or use drugs.  ROS:                                        Physical Exam: There were no vitals taken for this visit.  Constitutional:  Alert and oriented, No acute distress.  Laboratory Data: Lab Results  Component Value Date   WBC 4.9 11/30/2017   HGB 13.7 11/30/2017   HCT 40.1 11/30/2017   MCV 93.3 11/30/2017   PLT 354.0 11/30/2017    Lab Results  Component Value Date   CREATININE 0.88 01/28/2019    No results found for: PSA  No results found for: TESTOSTERONE  Lab Results  Component Value Date   HGBA1C 5.5 01/28/2019    Urinalysis    Component Value Date/Time   COLORURINE YELLOW 01/15/2018 1437   APPEARANCEUR Clear 12/03/2018 1134   LABSPEC 1.025 01/15/2018 1437   PHURINE 5.5 01/15/2018 1437   GLUCOSEU Negative 12/03/2018 1134   GLUCOSEU NEGATIVE 01/15/2018 1437   HGBUR NEGATIVE 01/15/2018 1437   BILIRUBINUR Negative 12/03/2018 1134   KETONESUR negative 11/06/2018 1139   KETONESUR NEGATIVE 01/15/2018 1437   PROTEINUR Negative 12/03/2018 1134   UROBILINOGEN 0.2 11/06/2018 1139   UROBILINOGEN 0.2 01/15/2018 1437   NITRITE Negative 12/03/2018 1134   NITRITE NEGATIVE 01/15/2018 1437   LEUKOCYTESUR Negative 12/03/2018 1134    Pertinent Imaging:   Assessment & Plan: PTNS.  There are no diagnoses linked to this encounter.  No follow-ups on file.  Martina Sinner, MD  [facility] [address], [address] [place], [place] [address] [phone]

## 2019-02-08 ENCOUNTER — Telehealth: Payer: Self-pay | Admitting: Urology

## 2019-02-08 NOTE — Telephone Encounter (Signed)
Pt lm asking for someone to call her and tell her what the side effects of having PTNS is going to be before she starts her treatments next week. Please advise. Thanks.

## 2019-02-08 NOTE — Telephone Encounter (Signed)
Spoke to patient and explained how PTNS is done and that there are usually no side effects. She will just have tingling at the needle site for the duration of the treatment. Patient voiced understanding.

## 2019-02-15 NOTE — Telephone Encounter (Signed)
Called number below to have PA initiated. Awaiting fax.

## 2019-02-27 ENCOUNTER — Other Ambulatory Visit: Payer: Self-pay

## 2019-02-27 ENCOUNTER — Ambulatory Visit (INDEPENDENT_AMBULATORY_CARE_PROVIDER_SITE_OTHER): Payer: Medicare Other | Admitting: Urology

## 2019-02-27 ENCOUNTER — Ambulatory Visit: Payer: Self-pay | Admitting: Urology

## 2019-02-27 DIAGNOSIS — R350 Frequency of micturition: Secondary | ICD-10-CM

## 2019-02-27 NOTE — Progress Notes (Signed)
PTNS  Session # ONE  Health & Social Factors: None Caffeine: 0 Alcohol: 0 Daytime voids #per day: 3-4 Night-time voids #per night: 3-4 Urgency: Strong Incontinence Episodes #per day: 1 Ankle used: Left Treatment Setting: 8 Feeling/ Response: Sensory Comments: Patient tolerated procedure well  Performed By: Michiel Cowboy, PA-C  Assistant: Honor Loh, CME  Follow Up: One week

## 2019-03-04 ENCOUNTER — Ambulatory Visit: Payer: Self-pay | Admitting: Urology

## 2019-03-06 ENCOUNTER — Other Ambulatory Visit: Payer: Self-pay

## 2019-03-06 ENCOUNTER — Ambulatory Visit (INDEPENDENT_AMBULATORY_CARE_PROVIDER_SITE_OTHER): Payer: Medicare Other | Admitting: Urology

## 2019-03-06 DIAGNOSIS — N3946 Mixed incontinence: Secondary | ICD-10-CM | POA: Diagnosis not present

## 2019-03-06 NOTE — Progress Notes (Signed)
PTNS  Session # 2  Health & Social Factors: no change Caffeine: 0 Alcohol: 0 Daytime voids #per day: 4 Night-time voids #per night: 2 Urgency: strong Incontinence Episodes #per day: 2 Ankle used: left Treatment Setting: 13 Feeling/ Response: sensory Comments: patient tolerated well  Preformed By: Teressa Lower, CMA   Follow Up: 1 week #3

## 2019-03-11 ENCOUNTER — Ambulatory Visit: Payer: Self-pay | Admitting: Urology

## 2019-03-13 ENCOUNTER — Other Ambulatory Visit: Payer: Self-pay

## 2019-03-13 ENCOUNTER — Ambulatory Visit (INDEPENDENT_AMBULATORY_CARE_PROVIDER_SITE_OTHER): Payer: Medicare Other | Admitting: Urology

## 2019-03-13 DIAGNOSIS — R350 Frequency of micturition: Secondary | ICD-10-CM

## 2019-03-13 NOTE — Progress Notes (Signed)
PTNS  Session # 3  Health & Social Factors: None Caffeine: 0-1 Alcohol: 0 Daytime voids #per day: 4 Night-time voids #per night: 4 Urgency: mild Incontinence Episodes #per day: 0 Ankle used: Left Treatment Setting: 8 Feeling/ Response: Sensory Comments: Patient tolerated the procedure well.   Performed By: Michiel Cowboy, PA-C and Honor Loh, CME  Follow Up: One week

## 2019-03-18 ENCOUNTER — Ambulatory Visit: Payer: Self-pay | Admitting: Urology

## 2019-03-19 NOTE — Telephone Encounter (Signed)
Spoke with [facility] PA group and was advised that PA was not needed and not even showing on med list. She sees GI and per chart is taking omeprazole.

## 2019-03-20 ENCOUNTER — Ambulatory Visit (INDEPENDENT_AMBULATORY_CARE_PROVIDER_SITE_OTHER): Payer: Medicare Other | Admitting: Urology

## 2019-03-20 ENCOUNTER — Other Ambulatory Visit: Payer: Self-pay

## 2019-03-20 ENCOUNTER — Encounter: Payer: Self-pay | Admitting: Urology

## 2019-03-20 DIAGNOSIS — R350 Frequency of micturition: Secondary | ICD-10-CM

## 2019-03-20 NOTE — Progress Notes (Signed)
PTNS  Session # 4  Health & Social Factors: no change Caffeine: 0-1 Alcohol: 0 Daytime voids #per day: 4-5 Night-time voids #per night: 2-3 Urgency: mild Incontinence Episodes #per day: 0 Ankle used: left Treatment Setting: 7 Feeling/ Response: sensory Comments: Patient tolerated the procedure.    Performed By: Honor Loh, CME  Follow Up: one week

## 2019-03-25 ENCOUNTER — Ambulatory Visit: Payer: Self-pay | Admitting: Urology

## 2019-03-27 ENCOUNTER — Ambulatory Visit (INDEPENDENT_AMBULATORY_CARE_PROVIDER_SITE_OTHER): Payer: Medicare Other | Admitting: Urology

## 2019-03-27 ENCOUNTER — Other Ambulatory Visit: Payer: Self-pay

## 2019-03-27 DIAGNOSIS — R350 Frequency of micturition: Secondary | ICD-10-CM | POA: Diagnosis not present

## 2019-03-27 NOTE — Progress Notes (Signed)
PTNS  Session # 5  Health & Social Factors: no change Caffeine: 2 Alcohol: 0 Daytime voids #per day: 3 Night-time voids #per night: 2 Urgency: mild Incontinence Episodes #per day: 0 Ankle used: left Treatment Setting: 7 Feeling/ Response: both Comments: patient tolerated well  Preformed By: Teressa Lower, CMA  Follow Up: 1 week #6

## 2019-04-01 ENCOUNTER — Ambulatory Visit: Payer: Self-pay | Admitting: Urology

## 2019-04-02 ENCOUNTER — Other Ambulatory Visit: Payer: Self-pay

## 2019-04-02 ENCOUNTER — Ambulatory Visit (INDEPENDENT_AMBULATORY_CARE_PROVIDER_SITE_OTHER): Payer: Medicare Other | Admitting: Internal Medicine

## 2019-04-02 DIAGNOSIS — K219 Gastro-esophageal reflux disease without esophagitis: Secondary | ICD-10-CM | POA: Diagnosis not present

## 2019-04-02 DIAGNOSIS — R030 Elevated blood-pressure reading, without diagnosis of hypertension: Secondary | ICD-10-CM

## 2019-04-02 DIAGNOSIS — K2270 Barrett's esophagus without dysplasia: Secondary | ICD-10-CM

## 2019-04-02 DIAGNOSIS — F439 Reaction to severe stress, unspecified: Secondary | ICD-10-CM

## 2019-04-02 DIAGNOSIS — E7800 Pure hypercholesterolemia, unspecified: Secondary | ICD-10-CM

## 2019-04-02 DIAGNOSIS — E041 Nontoxic single thyroid nodule: Secondary | ICD-10-CM

## 2019-04-02 NOTE — Progress Notes (Addendum)
Subjective:    Patient ID: Carrie Noble, female    DOB: 16-Aug-1940, 79 y.o.   MRN: [id number]  HPI  Patient here for a scheduled follow up.  Previously saw Dr Troy Sine.  S/p CT- small hiatal hernia and focal hypodense lesion in the right posterior hepatic lobe.  MRI - focal area of fatty infiltration in the central left lobe.  No evidence of hepatic neoplasm.  Mild diffuse hepatic steatosis.   Alternating 10 and 20mg  omeprazole.  Acid reflux is better.  Eating.  No nausea or vomiting.  Has thyroid nodule.  Needs f/u with endocrinology.  Has noticed some persistent right side pain.  No significant abnormality noted on exam today.  Wants to hold on further w/up. Seeing urology for her bladder.   Blood pressure noted to be elevated today.   Saw Dr .  Using pataday drops.  Helping.  Also using nasacort.    Past Medical History:  Diagnosis Date  . Allergy   . Arthritis   . Gastroesophageal reflux   . Hiatal hernia 1989   status post Nissen fundoplication   . Hx of hysterectomy   . Hyperlipidemia   . Tachycardia    Patient stated that this has been occuring frequently.  . Thyroid disease    Goiter   Past Surgical History:  Procedure Laterality Date  . ABDOMINAL HYSTERECTOMY  1980   partial  . BREAST BIOPSY Right    neg  . BREAST EXCISIONAL BIOPSY Left 2014   neg  . COLONOSCOPY  2015  . ESOPHAGOGASTRIC FUNDOPLICATION  1999  . LAPAROSCOPIC NISSEN FUNDOPLICATION  1999  . TONSILLECTOMY     as well as goiter  . UPPER GI ENDOSCOPY  2015   Family History  Problem Relation Age of Onset  . Stroke Mother 49  . Arthritis Mother   . Heart disease Mother   . Hypertension Mother   . Stroke Father 70  . Hypertension Father   . Rheumatic fever Brother        and multiple open heart surgeries  . Diabetes Brother        type 2  . Stroke Brother   . Other Sister        Coronary Atherosclerosis  . Stroke Brother   . Stroke Sister   . Heart disease Sister   . Dementia Sister   .  Cancer Sister        ovarian   Social History   Socioeconomic History  . Marital status: Married    Spouse name: Not on file  . Number of children: Not on file  . Years of education: Not on file  . Highest education level: Not on file  Occupational History  . Occupation: Retired    68: OTHER  Tobacco Use  . Smoking status: Never Smoker  . Smokeless tobacco: Never Used  Substance and Sexual Activity  . Alcohol use: No    Alcohol/week: 0.0 standard drinks  . Drug use: No  . Sexual activity: Never  Other Topics Concern  . Not on file  Social History Narrative   Occasionally drinks coffee.   Social Determinants of Health   Financial Resource Strain:   . Difficulty of Paying Living Expenses:   Food Insecurity:   . Worried About Associate Professor in the Last Year:   . Programme researcher, broadcasting/film/video in the Last Year:   Transportation Needs:   . Barista (Medical):   Freight forwarder Lack of Transportation (  Non-Medical):   Physical Activity:   . Days of Exercise per Week:   . Minutes of Exercise per Session:   Stress:   . Feeling of Stress :   Social Connections:   . Frequency of Communication with Friends and Family:   . Frequency of Social Gatherings with Friends and Family:   . Attends Religious Services:   . Active Member of Clubs or Organizations:   . Attends Archivist Meetings:   Marland Kitchen Marital Status:     Outpatient Encounter Medications as of 04/02/2019  Medication Sig  . Biotin 1000 MCG tablet Take 1,000 mcg by mouth 3 (three) times daily. Reported on 05/13/2015  . Calcium Carbonate (CALCIUM 600 PO) Take by mouth daily. Reported on 05/13/2015  . Cholecalciferol (VITAMIN D PO) Take by mouth daily. Reported on 05/13/2015  . Epinastine HCl 0.05 % ophthalmic solution Place 1 drop into both eyes 2 (two) times daily as needed.  . hydrocortisone 2.5 % cream   . neomycin-polymyxin-dexamethasone (MAXITROL) 0.1 % ophthalmic suspension Place 1 drop into both eyes 2 (two)  times daily.  . Olopatadine HCl (PAZEO) 0.7 % SOLN Place 1 drop into both eyes daily.  Marland Kitchen omeprazole (PRILOSEC) 20 MG capsule Take 20 mg by mouth daily.  . vitamin B-12 (CYANOCOBALAMIN) 1000 MCG tablet Take 1,000 mcg by mouth daily. Reported on 05/13/2015   Facility-Administered Encounter Medications as of 04/02/2019  Medication  . betamethasone acetate-betamethasone sodium phosphate (CELESTONE) injection 3 mg  . betamethasone acetate-betamethasone sodium phosphate (CELESTONE) injection 3 mg    Review of Systems  Constitutional: Negative for appetite change and unexpected weight change.  HENT: Negative for congestion and sinus pressure.   Respiratory: Negative for cough, chest tightness and shortness of breath.   Cardiovascular: Negative for chest pain, palpitations and leg swelling.  Gastrointestinal: Negative for diarrhea, nausea and vomiting.       Right side pain as outlined.  Acid reflux better.    Genitourinary:       Followed by urology for her urinary symptoms.    Musculoskeletal: Negative for joint swelling and myalgias.  Skin: Negative for color change and rash.  Neurological: Negative for dizziness, light-headedness and headaches.  Psychiatric/Behavioral: Negative for agitation and dysphoric mood.       Objective:    Physical Exam Constitutional:      General: She is not in acute distress.    Appearance: Normal appearance.  HENT:     Head: Normocephalic and atraumatic.     Right Ear: External ear normal.     Left Ear: External ear normal.  Eyes:     Conjunctiva/sclera: Conjunctivae normal.  Neck:     Thyroid: No thyromegaly.     Comments: Thyroid nodule - palpated.   Cardiovascular:     Rate and Rhythm: Normal rate and regular rhythm.  Pulmonary:     Effort: No respiratory distress.     Breath sounds: Normal breath sounds. No wheezing.  Abdominal:     General: Bowel sounds are normal.     Palpations: Abdomen is soft.     Tenderness: There is no abdominal  tenderness.  Musculoskeletal:        General: No swelling or tenderness.     Cervical back: Neck supple. No tenderness.  Lymphadenopathy:     Cervical: No cervical adenopathy.  Skin:    Findings: No erythema or rash.  Neurological:     Mental Status: She is alert and oriented to person, place, and time.  Psychiatric:  Mood and Affect: Mood normal.        Behavior: Behavior normal.     BP 130/74   Pulse 77   Temp (!) 96.9 F (36.1 C)   Resp 16   Wt 139 lb 3.2 oz (63.1 kg)   SpO2 99%   BMI 23.89 kg/m  Wt Readings from Last 3 Encounters:  04/03/19 139 lb (63 kg)  04/02/19 139 lb 3.2 oz (63.1 kg)  02/04/19 141 lb (64 kg)     Lab Results  Component Value Date   WBC 4.9 11/30/2017   HGB 13.7 11/30/2017   HCT 40.1 11/30/2017   PLT 354.0 11/30/2017   GLUCOSE 96 01/28/2019   CHOL 239 (H) 01/28/2019   TRIG 183.0 (H) 01/28/2019   HDL 52.90 01/28/2019   LDLDIRECT 141.0 09/01/2016   LDLCALC 150 (H) 01/28/2019   ALT 13 01/28/2019   AST 18 01/28/2019   NA 138 01/28/2019   K 4.3 01/28/2019   CL 102 01/28/2019   CREATININE 0.88 01/28/2019   BUN 14 01/28/2019   CO2 28 01/28/2019   TSH 2.15 11/30/2017   HGBA1C 5.5 01/28/2019    MM 3D SCREEN BREAST BILATERAL  Result Date: 12/12/2018 CLINICAL DATA:  Screening. EXAM: DIGITAL SCREENING BILATERAL MAMMOGRAM WITH TOMO AND CAD COMPARISON:  Previous exam(s). ACR Breast Density Category c: The breast tissue is heterogeneously dense, which may obscure small masses. FINDINGS: There are no findings suspicious for malignancy. Images were processed with CAD. IMPRESSION: No mammographic evidence of malignancy. A result letter of this screening mammogram will be mailed directly to the patient. RECOMMENDATION: Screening mammogram in one year. (Code:SM-B-01Y) BI-RADS CATEGORY  1: Negative. Electronically Signed   By: Emmaline Kluver M.D.   On: 12/12/2018 15:21       Assessment & Plan:   Problem List Items Addressed This Visit     Barrett's esophagus    Evaluated by Dr Troy Sine.  Recommended continuing prilosec.  She is alternating 10mg  and 20mg  qod.  Acid reflux under better control.  Follow.        Elevated blood pressure reading    Blood pressure elevated on my check.  Have her spot check her pressure.  Get her back in soon to reassess.  Follow pressures.  Follow metabolic panel.       GERD (gastroesophageal reflux disease)    Under better control as outlined.  Continues on prilosec.        Hypercholesterolemia    Off crestor.  She declines to try another cholesterol medication.  Low cholesterol diet and exercise.  Follow lipid panel and liver function tests.        Relevant Orders   CBC with Differential/Platelet   Hepatic function panel   Lipid panel   TSH   Basic metabolic panel   Stress    Overall handling things relatively well.  Follow.        Thyroid nodule    Thyroid nodule palpated on exam.  Has seen Dr previously.  Given nodule palpated on exam today, refer back to endocrinology.        Relevant Orders   Ambulatory referral to Endocrinology       , MD

## 2019-04-03 ENCOUNTER — Encounter: Payer: Self-pay | Admitting: Urology

## 2019-04-03 ENCOUNTER — Ambulatory Visit (INDEPENDENT_AMBULATORY_CARE_PROVIDER_SITE_OTHER): Payer: Medicare Other | Admitting: Urology

## 2019-04-03 VITALS — BP 142/80 | HR 74 | Ht 64.0 in | Wt 139.0 lb

## 2019-04-03 DIAGNOSIS — R350 Frequency of micturition: Secondary | ICD-10-CM

## 2019-04-03 NOTE — Progress Notes (Signed)
PTNS  Session # 6  Health & Social Factors Caffeine: 2 Alcohol: 0 Daytime voids #per day: 4-5 Night-time voids #per night: 4-5 - may be due to starting fiber Urgency: strong Incontinence Episodes #per day: 1 Ankle used: Right Treatment Setting: 5 Feeling/ Response: both Comments: Patient tolerated the procedure well  Performed By: Michiel Cowboy, PA-C  Follow Up: one week

## 2019-04-07 ENCOUNTER — Encounter: Payer: Self-pay | Admitting: Internal Medicine

## 2019-04-07 DIAGNOSIS — R030 Elevated blood-pressure reading, without diagnosis of hypertension: Secondary | ICD-10-CM | POA: Insufficient documentation

## 2019-04-07 NOTE — Assessment & Plan Note (Signed)
Evaluated by Dr Troy Sine.  Recommended continuing prilosec.  She is alternating 10mg  and 20mg  qod.  Acid reflux under better control.  Follow.

## 2019-04-07 NOTE — Assessment & Plan Note (Signed)
Under better control as outlined.  Continues on prilosec.

## 2019-04-07 NOTE — Assessment & Plan Note (Signed)
Overall handling things relatively well.  Follow.  

## 2019-04-07 NOTE — Addendum Note (Signed)
Addended by: Charm Barges on: 04/07/2019 04:33 PM   Modules accepted: Orders

## 2019-04-07 NOTE — Assessment & Plan Note (Signed)
Blood pressure elevated on my check.  Have her spot check her pressure.  Get her back in soon to reassess.  Follow pressures.  Follow metabolic panel.

## 2019-04-07 NOTE — Assessment & Plan Note (Signed)
Off crestor.  She declines to try another cholesterol medication.  Low cholesterol diet and exercise.  Follow lipid panel and liver function tests.

## 2019-04-07 NOTE — Assessment & Plan Note (Signed)
Thyroid nodule palpated on exam.  Has seen Dr Renae Fickle previously.  Given nodule palpated on exam today, refer back to endocrinology.

## 2019-04-08 ENCOUNTER — Ambulatory Visit: Payer: Self-pay | Admitting: Urology

## 2019-04-10 ENCOUNTER — Other Ambulatory Visit: Payer: Self-pay

## 2019-04-10 ENCOUNTER — Ambulatory Visit (INDEPENDENT_AMBULATORY_CARE_PROVIDER_SITE_OTHER): Payer: Medicare Other | Admitting: Urology

## 2019-04-10 DIAGNOSIS — R350 Frequency of micturition: Secondary | ICD-10-CM

## 2019-04-10 NOTE — Progress Notes (Signed)
PTNS  Session # 7  Health & Social Factors: No change Caffeine: 2 Alcohol: 0 Daytime voids #per day: 4 Night-time voids #per night: 3 Urgency: mild Incontinence Episodes #per day: 0 Ankle used: Right Treatment Setting: 4 Feeling/ Response: Both Comments: Patient tolerated the procedure well  Performed By: Michiel Cowboy, PA-C

## 2019-04-15 ENCOUNTER — Ambulatory Visit: Payer: Self-pay | Admitting: Urology

## 2019-04-17 ENCOUNTER — Other Ambulatory Visit: Payer: Self-pay

## 2019-04-17 ENCOUNTER — Ambulatory Visit (INDEPENDENT_AMBULATORY_CARE_PROVIDER_SITE_OTHER): Payer: Medicare Other | Admitting: Urology

## 2019-04-17 DIAGNOSIS — R350 Frequency of micturition: Secondary | ICD-10-CM | POA: Diagnosis not present

## 2019-04-17 NOTE — Progress Notes (Signed)
PTNS  Session # 8  Health & Social Factors: No change Caffeine: Had two regular coffees yesterday and did not notice a worsening of symptoms Alcohol: 0 Daytime voids #per day: 4 Night-time voids #per night: 2 Urgency: mild Incontinence Episodes #per day: 2 Ankle used: Right Treatment Setting: 4 Feeling/ Response: Both Comments: Patient tolerated the procedure well.   Performed By: Martha Clan, CMA  Assistant: Harle Battiest, PA-C  Follow Up: one week

## 2019-04-22 ENCOUNTER — Ambulatory Visit: Payer: Self-pay | Admitting: Urology

## 2019-04-24 ENCOUNTER — Ambulatory Visit (INDEPENDENT_AMBULATORY_CARE_PROVIDER_SITE_OTHER): Payer: Medicare Other | Admitting: Urology

## 2019-04-24 ENCOUNTER — Other Ambulatory Visit: Payer: Self-pay

## 2019-04-24 DIAGNOSIS — R350 Frequency of micturition: Secondary | ICD-10-CM

## 2019-04-24 NOTE — Patient Instructions (Signed)
Tracking Your Bladder Symptoms    Patient Name:___________________________________________________   Sample: Day   Daytime Voids  Nighttime Voids Urgency for the Day(0-4) Number of Accidents Beverage Comments  Monday IIII II 2 I Water IIII Coffee  I      Week Starting:____________________________________   Day Daytime  Voids Nighttime  Voids Urgency for  The Day(0-4) Number of Accidents Beverages Comments                                                           This week my symptoms were:  O much better  O better O the same O worse   

## 2019-04-24 NOTE — Progress Notes (Signed)
PTNS  Session # 9  Health & Social Factors: no change Caffeine: 0 Alcohol: 0 Daytime voids #per day: 4 Night-time voids #per night: 3 Urgency: mild Incontinence Episodes #per day: 0-2 Ankle used: left Treatment Setting: 3  Feeling/ Response: both Comments:  Patient tolerated well  Preformed By: Eligha Bridegroom, CMA  Follow Up: 1wk

## 2019-04-29 ENCOUNTER — Ambulatory Visit: Payer: Self-pay | Admitting: Urology

## 2019-05-01 ENCOUNTER — Other Ambulatory Visit: Payer: Self-pay

## 2019-05-01 ENCOUNTER — Ambulatory Visit (INDEPENDENT_AMBULATORY_CARE_PROVIDER_SITE_OTHER): Payer: Medicare Other | Admitting: Urology

## 2019-05-01 DIAGNOSIS — R350 Frequency of micturition: Secondary | ICD-10-CM | POA: Diagnosis not present

## 2019-05-01 NOTE — Progress Notes (Signed)
PTNS  Session # 10  Health & Social Factors: No change Caffeine: 1 cup Alcohol: 0 Daytime voids #per day: 4 to 5 Night-time voids #per night: 2 to 3 Urgency: strong Incontinence Episodes #per day: 0-2 at night Ankle used: Left  Treatment Setting: 8  Feeling/ Response: Both Comments: Patient tolerated the procedure well   Performed By: Michiel Cowboy, PA-C  Assistant: Franchot Erichsen, CMA   Follow Up: One week

## 2019-05-06 ENCOUNTER — Ambulatory Visit: Payer: Self-pay | Admitting: Urology

## 2019-05-08 ENCOUNTER — Ambulatory Visit (INDEPENDENT_AMBULATORY_CARE_PROVIDER_SITE_OTHER): Payer: Medicare Other | Admitting: Urology

## 2019-05-08 ENCOUNTER — Encounter: Payer: Self-pay | Admitting: Urology

## 2019-05-08 ENCOUNTER — Other Ambulatory Visit: Payer: Self-pay

## 2019-05-08 DIAGNOSIS — R350 Frequency of micturition: Secondary | ICD-10-CM

## 2019-05-08 NOTE — Progress Notes (Signed)
PTNS  Session # 11  Health & Social Factors: No change Caffeine: 0 Alcohol: 0 Daytime voids #per day: 4-5 Night-time voids #per night: 2-3 Urgency: mild Incontinence Episodes #per day: 0-1 Ankle used: right Treatment Setting: 1 Feeling/ Response: Both Comments: Patient tolerated the procedure well.   Performed By: Michiel Cowboy, PA-C and Teressa Lower, CMA  Follow Up: One week

## 2019-05-13 ENCOUNTER — Ambulatory Visit: Payer: Self-pay | Admitting: Urology

## 2019-05-15 ENCOUNTER — Other Ambulatory Visit: Payer: Self-pay

## 2019-05-15 ENCOUNTER — Ambulatory Visit (INDEPENDENT_AMBULATORY_CARE_PROVIDER_SITE_OTHER): Payer: Medicare Other | Admitting: Urology

## 2019-05-15 DIAGNOSIS — R350 Frequency of micturition: Secondary | ICD-10-CM

## 2019-05-15 NOTE — Progress Notes (Signed)
PTNS  Session # 12  Health & Social Factors: No change Caffeine: 2 Alcohol: 0 Daytime voids #per day: 5 Night-time voids #per night: 3 Urgency: mild Incontinence Episodes #per day: 0-1 Ankle used: Right Treatment Setting: 7 Feeling/ Response: Sensory Comments: Patient tolerated the procedure well.    Performed By: Michiel Cowboy, PA-C and Teressa Lower, CMA  Follow Up: One month with Dr. Sherron Monday

## 2019-05-30 ENCOUNTER — Other Ambulatory Visit (INDEPENDENT_AMBULATORY_CARE_PROVIDER_SITE_OTHER): Payer: Medicare Other

## 2019-05-30 ENCOUNTER — Other Ambulatory Visit: Payer: Self-pay

## 2019-05-30 DIAGNOSIS — E7800 Pure hypercholesterolemia, unspecified: Secondary | ICD-10-CM

## 2019-05-30 LAB — HEPATIC FUNCTION PANEL
ALT: 16 U/L (ref 0–35)
AST: 19 U/L (ref 0–37)
Albumin: 4.3 g/dL (ref 3.5–5.2)
Alkaline Phosphatase: 114 U/L (ref 39–117)
Bilirubin, Direct: 0.1 mg/dL (ref 0.0–0.3)
Total Bilirubin: 0.5 mg/dL (ref 0.2–1.2)
Total Protein: 6.9 g/dL (ref 6.0–8.3)

## 2019-05-30 LAB — BASIC METABOLIC PANEL
BUN: 18 mg/dL (ref 6–23)
CO2: 29 mEq/L (ref 19–32)
Calcium: 9.4 mg/dL (ref 8.4–10.5)
Chloride: 103 mEq/L (ref 96–112)
Creatinine, Ser: 0.96 mg/dL (ref 0.40–1.20)
GFR: 55.91 mL/min — ABNORMAL LOW (ref >60.00)
Glucose, Bld: 91 mg/dL (ref 70–99)
Potassium: 4.1 mEq/L (ref 3.5–5.1)
Sodium: 138 mEq/L (ref 135–145)

## 2019-05-30 LAB — CBC WITH DIFFERENTIAL/PLATELET
Basophils Absolute: 0 10*3/uL (ref 0.0–0.1)
Basophils Relative: 0.7 % (ref 0.0–3.0)
Eosinophils Absolute: 0.1 10*3/uL (ref 0.0–0.7)
Eosinophils Relative: 2.7 % (ref 0.0–5.0)
HCT: 38 % (ref 36.0–46.0)
Hemoglobin: 13.2 g/dL (ref 12.0–15.0)
Lymphocytes Relative: 44.6 % (ref 12.0–46.0)
Lymphs Abs: 2.3 10*3/uL (ref 0.7–4.0)
MCHC: 34.8 g/dL (ref 30.0–36.0)
MCV: 93.5 fl (ref 78.0–100.0)
Monocytes Absolute: 0.4 10*3/uL (ref 0.1–1.0)
Monocytes Relative: 8.2 % (ref 3.0–12.0)
Neutro Abs: 2.3 10*3/uL (ref 1.4–7.7)
Neutrophils Relative %: 43.8 % (ref 43.0–77.0)
Platelets: 365 10*3/uL (ref 150.0–400.0)
RBC: 4.06 Mil/uL (ref 3.87–5.11)
RDW: 13.7 % (ref 11.5–15.5)
WBC: 5.1 10*3/uL (ref 4.0–10.5)

## 2019-05-30 LAB — LIPID PANEL
Cholesterol: 247 mg/dL — ABNORMAL HIGH (ref 0–200)
HDL: 61 mg/dL (ref >39.00)
LDL Cholesterol: 169 mg/dL — ABNORMAL HIGH (ref 0–99)
NonHDL: 186.01
Total CHOL/HDL Ratio: 4
Triglycerides: 83 mg/dL (ref 0.0–149.0)
VLDL: 16.6 mg/dL (ref 0.0–40.0)

## 2019-05-30 LAB — TSH: TSH: 3.74 u[IU]/mL (ref 0.35–4.50)

## 2019-06-03 ENCOUNTER — Other Ambulatory Visit: Payer: Self-pay

## 2019-06-03 ENCOUNTER — Encounter: Payer: Self-pay | Admitting: Internal Medicine

## 2019-06-03 ENCOUNTER — Ambulatory Visit (INDEPENDENT_AMBULATORY_CARE_PROVIDER_SITE_OTHER): Payer: Medicare Other | Admitting: Internal Medicine

## 2019-06-03 VITALS — BP 112/68 | HR 86 | Temp 97.2°F | Resp 16 | Ht 64.0 in | Wt 141.0 lb

## 2019-06-03 DIAGNOSIS — E7800 Pure hypercholesterolemia, unspecified: Secondary | ICD-10-CM

## 2019-06-03 DIAGNOSIS — F439 Reaction to severe stress, unspecified: Secondary | ICD-10-CM | POA: Diagnosis not present

## 2019-06-03 DIAGNOSIS — E041 Nontoxic single thyroid nodule: Secondary | ICD-10-CM

## 2019-06-03 DIAGNOSIS — K219 Gastro-esophageal reflux disease without esophagitis: Secondary | ICD-10-CM | POA: Diagnosis not present

## 2019-06-03 DIAGNOSIS — Z0000 Encounter for general adult medical examination without abnormal findings: Secondary | ICD-10-CM | POA: Diagnosis not present

## 2019-06-03 MED ORDER — ATORVASTATIN CALCIUM 10 MG PO TABS: ORAL_TABLET | ORAL | 1 refills | Status: DC

## 2019-06-03 NOTE — Assessment & Plan Note (Signed)
Physical today 06/03/19.  Mammogram 12/12/18 -Birads I.  Colonoscopy 2015.  Recommended f/u in 10 years.

## 2019-06-03 NOTE — Progress Notes (Signed)
Patient ID: Carrie Noble, female   DOB: 1941/01/21, 79 y.o.   MRN: [id number]   Subjective:    Patient ID: Carrie Noble, female    DOB: November 19, 1940, 79 y.o.   MRN: [id number]  HPI This visit occurred during the SARS-CoV-2 public health emergency.  Safety protocols were in place, including screening questions prior to the visit, additional usage of staff PPE, and extensive cleaning of exam room while observing appropriate contact time as indicated for disinfecting solutions.  Patient here for her physical exam.  Seeing urology for urinary frequency.  Receiving treatments.  Also saw endocrinology 05/13/19 - for f/u thyroid nodule.  Previous biopsy - benign pathology.  Recommended f/u thyroid ultrasound and labs.  TSH wn.  Ultrasound per report revealed no significant change.  Recommended f/u thyroid ultrasound in one year.  She was also evaluated by Dr Troy Sine.  Has a documented history of Barretts.  On prilosec - 40mg  q day now.  Still with symptoms.  Worse at night.  Dr discussed f/u EGD.  Discussed with her today.  She is in agreement.  Increased stress with her granddaughter.  Discussed with her.   Does not feel needs any further intervention.  Discussed medication and counseling.  Will follow.     Past Medical History:  Diagnosis Date  . Allergy   . Arthritis   . Gastroesophageal reflux   . Hiatal hernia 1989   status post Nissen fundoplication   . Hx of hysterectomy   . Hyperlipidemia   . Tachycardia    Patient stated that this has been occuring frequently.  . Thyroid disease    Goiter   Past Surgical History:  Procedure Laterality Date  . ABDOMINAL HYSTERECTOMY  1980   partial  . BREAST BIOPSY Right    neg  . BREAST EXCISIONAL BIOPSY Left 2014   neg  . COLONOSCOPY  2015  . ESOPHAGOGASTRIC FUNDOPLICATION  1999  . LAPAROSCOPIC NISSEN FUNDOPLICATION  1999  . TONSILLECTOMY     as well as goiter  . UPPER GI ENDOSCOPY  2015   Family History  Problem Relation Age of Onset   . Stroke Mother 35  . Arthritis Mother   . Heart disease Mother   . Hypertension Mother   . Stroke Father 70  . Hypertension Father   . Rheumatic fever Brother        and multiple open heart surgeries  . Diabetes Brother        type 2  . Stroke Brother   . Other Sister        Coronary Atherosclerosis  . Stroke Brother   . Stroke Sister   . Heart disease Sister   . Dementia Sister   . Cancer Sister        ovarian   Social History   Socioeconomic History  . Marital status: Married    Spouse name: Not on file  . Number of children: Not on file  . Years of education: Not on file  . Highest education level: Not on file  Occupational History  . Occupation: Retired    68: OTHER  Tobacco Use  . Smoking status: Never Smoker  . Smokeless tobacco: Never Used  Substance and Sexual Activity  . Alcohol use: No    Alcohol/week: 0.0 standard drinks  . Drug use: No  . Sexual activity: Never  Other Topics Concern  . Not on file  Social History Narrative   Occasionally drinks coffee.   Social Determinants  of Health   Financial Resource Strain:   . Difficulty of Paying Living Expenses:   Food Insecurity:   . Worried About Programme researcher, broadcasting/film/video in the Last Year:   . Barista in the Last Year:   Transportation Needs:   . Freight forwarder (Medical):   Marland Kitchen Lack of Transportation (Non-Medical):   Physical Activity:   . Days of Exercise per Week:   . Minutes of Exercise per Session:   Stress:   . Feeling of Stress :   Social Connections:   . Frequency of Communication with Friends and Family:   . Frequency of Social Gatherings with Friends and Family:   . Attends Religious Services:   . Active Member of Clubs or Organizations:   . Attends Banker Meetings:   Marland Kitchen Marital Status:     Outpatient Encounter Medications as of 06/03/2019  Medication Sig  . atorvastatin (LIPITOR) 10 MG tablet One tablet q Monday, Wednesday and Friday.  . Biotin 1000 MCG  tablet Take 1,000 mcg by mouth 3 (three) times daily. Reported on 05/13/2015  . Calcium Carbonate (CALCIUM 600 PO) Take by mouth daily. Reported on 05/13/2015  . Cholecalciferol (VITAMIN D PO) Take by mouth daily. Reported on 05/13/2015  . Epinastine HCl 0.05 % ophthalmic solution Place 1 drop into both eyes 2 (two) times daily as needed.  . hydrocortisone 2.5 % cream   . neomycin-polymyxin-dexamethasone (MAXITROL) 0.1 % ophthalmic suspension Place 1 drop into both eyes 2 (two) times daily.  . Olopatadine HCl (PAZEO) 0.7 % SOLN Place 1 drop into both eyes daily.  Marland Kitchen omeprazole (PRILOSEC) 20 MG capsule Take 20 mg by mouth daily.  . vitamin B-12 (CYANOCOBALAMIN) 1000 MCG tablet Take 1,000 mcg by mouth daily. Reported on 05/13/2015   Facility-Administered Encounter Medications as of 06/03/2019  Medication  . betamethasone acetate-betamethasone sodium phosphate (CELESTONE) injection 3 mg  . betamethasone acetate-betamethasone sodium phosphate (CELESTONE) injection 3 mg    Review of Systems  Constitutional: Negative for appetite change and unexpected weight change.  HENT: Negative for congestion and sinus pressure.   Eyes: Negative for pain and visual disturbance.  Respiratory: Negative for cough, chest tightness and shortness of breath.   Cardiovascular: Negative for chest pain, palpitations and leg swelling.  Gastrointestinal: Negative for abdominal pain, diarrhea, nausea and vomiting.       Reflux as outlined.    Genitourinary: Negative for difficulty urinating and dysuria.  Musculoskeletal: Negative for joint swelling and myalgias.  Skin: Negative for color change and rash.  Neurological: Negative for dizziness, light-headedness and headaches.  Hematological: Negative for adenopathy. Does not bruise/bleed easily.  Psychiatric/Behavioral: Negative for agitation and dysphoric mood.       Increased stress.         Objective:    Physical Exam Vitals reviewed.  Constitutional:       General: She is not in acute distress.    Appearance: Normal appearance. She is well-developed.  HENT:     Head: Normocephalic and atraumatic.     Right Ear: External ear normal.     Left Ear: External ear normal.  Eyes:     General: No scleral icterus.       Right eye: No discharge.        Left eye: No discharge.     Conjunctiva/sclera: Conjunctivae normal.  Neck:     Thyroid: No thyromegaly.  Cardiovascular:     Rate and Rhythm: Normal rate and regular rhythm.  Pulmonary:     Effort: No tachypnea, accessory muscle usage or respiratory distress.     Breath sounds: Normal breath sounds. No decreased breath sounds or wheezing.  Chest:     Breasts:        Right: No inverted nipple, mass, nipple discharge or tenderness (no axillary adenopathy).        Left: No inverted nipple, mass, nipple discharge or tenderness (no axilarry adenopathy).  Abdominal:     General: Bowel sounds are normal.     Palpations: Abdomen is soft.     Tenderness: There is no abdominal tenderness.  Musculoskeletal:        General: No swelling or tenderness.     Cervical back: Neck supple. No tenderness.  Lymphadenopathy:     Cervical: No cervical adenopathy.  Skin:    Findings: No erythema or rash.  Neurological:     Mental Status: She is alert and oriented to person, place, and time.  Psychiatric:        Mood and Affect: Mood normal.        Behavior: Behavior normal.     BP 112/68   Pulse 86   Temp (!) 97.2 F (36.2 C)   Resp 16   Ht 5\' 4"  (1.626 m)   Wt 141 lb (64 kg)   SpO2 98%   BMI 24.20 kg/m  Wt Readings from Last 3 Encounters:  06/03/19 141 lb (64 kg)  04/03/19 139 lb (63 kg)  04/02/19 139 lb 3.2 oz (63.1 kg)     Lab Results  Component Value Date   WBC 5.1 05/30/2019   HGB 13.2 05/30/2019   HCT 38.0 05/30/2019   PLT 365.0 05/30/2019   GLUCOSE 91 05/30/2019   CHOL 247 (H) 05/30/2019   TRIG 83.0 05/30/2019   HDL 61.00 05/30/2019   LDLDIRECT 141.0 09/01/2016   LDLCALC 169 (H)  05/30/2019   ALT 16 05/30/2019   AST 19 05/30/2019   NA 138 05/30/2019   K 4.1 05/30/2019   CL 103 05/30/2019   CREATININE 0.96 05/30/2019   BUN 18 05/30/2019   CO2 29 05/30/2019   TSH 3.74 05/30/2019   HGBA1C 5.5 01/28/2019    MM 3D SCREEN BREAST BILATERAL  Result Date: 12/12/2018 CLINICAL DATA:  Screening. EXAM: DIGITAL SCREENING BILATERAL MAMMOGRAM WITH TOMO AND CAD COMPARISON:  Previous exam(s). ACR Breast Density Category c: The breast tissue is heterogeneously dense, which may obscure small masses. FINDINGS: There are no findings suspicious for malignancy. Images were processed with CAD. IMPRESSION: No mammographic evidence of malignancy. A result letter of this screening mammogram will be mailed directly to the patient. RECOMMENDATION: Screening mammogram in one year. (Code:SM-B-01Y) BI-RADS CATEGORY  1: Negative. Electronically Signed   By: 12/14/2018 M.D.   On: 12/12/2018 15:21       Assessment & Plan:   Problem List Items Addressed This Visit    GERD (gastroesophageal reflux disease)    On omeprazole.  Has seen Dr 12/14/2018.  Persistent symptoms.  Refer back for question of need for EGD.       Health care maintenance    Physical today 06/03/19.  Mammogram 12/12/18 -Birads I.  Colonoscopy 2015.  Recommended f/u in 10 years.        Hypercholesterolemia    Off crestor.  Discussed cholesterol treatment today.  Agreed to try lipitor.  Low cholesterol diet and exercise.  Follow lipid panel and liver function tests.        Relevant Medications  atorvastatin (LIPITOR) 10 MG tablet   Other Relevant Orders   Hepatic function panel   Stress    Increased stress as outlined.  Discussed with her today.  Desires no further intervention.  Follow.        Thyroid nodule    Just evaluated by endocrinology.  tsh wnl.  F/u ultrasound as outlined.  No significant change.  Check thyroid ultrasound in one year.         Other Visit Diagnoses    Routine general medical  examination at a health care facility    -  Primary       Einar Pheasant, MD

## 2019-06-09 ENCOUNTER — Telehealth: Payer: Self-pay | Admitting: Internal Medicine

## 2019-06-09 ENCOUNTER — Encounter: Payer: Self-pay | Admitting: Internal Medicine

## 2019-06-09 NOTE — Assessment & Plan Note (Signed)
Just evaluated by endocrinology.  tsh wnl.  F/u ultrasound as outlined.  No significant change.  Check thyroid ultrasound in one year.

## 2019-06-09 NOTE — Assessment & Plan Note (Signed)
Increased stress as outlined.  Discussed with her today.  Desires no further intervention.  Follow.  

## 2019-06-09 NOTE — Telephone Encounter (Signed)
Pt sees Dr Troy Sine - GI in [place].  She sees him regularly.  He had mentioned possible EGD. She is still having problems and needs a f/u appt with Dr Troy Sine with question of need for EGD.  Please schedule.  Do I need to place order for another referral.

## 2019-06-09 NOTE — Assessment & Plan Note (Signed)
Off crestor.  Discussed cholesterol treatment today.  Agreed to try lipitor.  Low cholesterol diet and exercise.  Follow lipid panel and liver function tests.

## 2019-06-09 NOTE — Assessment & Plan Note (Signed)
On omeprazole.  Has seen Dr Troy Sine.  Persistent symptoms.  Refer back for question of need for EGD.

## 2019-06-10 NOTE — Telephone Encounter (Signed)
Please notify pt that she just needs to call and schedule a f/u appt with Dr Troy Sine.

## 2019-06-10 NOTE — Telephone Encounter (Signed)
No referral needed if pt is already est. Pt just needs to schedule appt.

## 2019-06-11 ENCOUNTER — Telehealth: Payer: Self-pay | Admitting: Internal Medicine

## 2019-06-11 NOTE — Telephone Encounter (Signed)
Left message for patient to call back and schedule Medicare Annual Wellness Visit (AWV) either virtually or audio only.  Last AWV 10/31/16; please schedule at anytime with Denisa O'Brien-Blaney at [facility].

## 2019-06-12 NOTE — Telephone Encounter (Signed)
Patient is aware and is going to call Dr Troy Sine to schedule an appointment

## 2019-06-17 ENCOUNTER — Ambulatory Visit (INDEPENDENT_AMBULATORY_CARE_PROVIDER_SITE_OTHER): Payer: Medicare Other | Admitting: Urology

## 2019-06-17 ENCOUNTER — Other Ambulatory Visit: Payer: Self-pay

## 2019-06-17 ENCOUNTER — Encounter: Payer: Self-pay | Admitting: Urology

## 2019-06-17 VITALS — BP 137/83 | HR 81 | Ht 63.0 in | Wt 138.0 lb

## 2019-06-17 DIAGNOSIS — N3946 Mixed incontinence: Secondary | ICD-10-CM

## 2019-06-17 DIAGNOSIS — R351 Nocturia: Secondary | ICD-10-CM | POA: Diagnosis not present

## 2019-06-17 LAB — URINALYSIS, COMPLETE
Bilirubin, UA: NEGATIVE
Glucose, UA: NEGATIVE
Ketones, UA: NEGATIVE
Leukocytes,UA: NEGATIVE
Nitrite, UA: NEGATIVE
Protein,UA: NEGATIVE
Specific Gravity, UA: 1.025 (ref 1.005–1.030)
Urobilinogen, Ur: 0.2 mg/dL (ref 0.2–1.0)
pH, UA: 5.5 (ref 5.0–7.5)

## 2019-06-17 LAB — MICROSCOPIC EXAMINATION
Bacteria, UA: NONE SEEN
WBC, UA: NONE SEEN /hpf (ref 0–5)

## 2019-06-17 MED ORDER — VIBEGRON 75 MG PO TABS: 75.0000 mg | ORAL_TABLET | Freq: Every day | ORAL | 11 refills | Status: DC

## 2019-06-17 MED ORDER — GEMTESA 75 MG PO TABS: 75.0000 mg | ORAL_TABLET | Freq: Every day | ORAL | 11 refills | Status: DC

## 2019-06-17 NOTE — Addendum Note (Signed)
Addended by: Veneta Penton on: 06/17/2019 01:06 PM   Modules accepted: Orders

## 2019-06-17 NOTE — Progress Notes (Signed)
06/17/2019 11:15 AM   Carrie Noble Jan 01, 1941 [id number]  Referring provider: Dale [place], MD [address] [place],  [place] [address]  Chief Complaint  Patient presents with  . Follow-up    1 mth post PTNS    HPI: I was consulted to assist the patient's worsening 5-year history of nighttime frequency. When she cannot sleep she voids every 30 minutes and other times twice. 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 I do not think she has bedwetting. She has uncommon mild urge incontinence during the day.  She normally voids about every 3 or 4 hours during the day and does well otherwise.It turns out she was a partial responder to Myrbetriq and she failed physical therapy  Patient has mild mixed incontinence. Primary complaint is worsening nocturia according to sleep pattern. She likely has a nocturnal diuresis. She may have a little bit of left ankle edema. Reassess on OXY ER10 mgin 6 weeks. Desmopressin a distant option. Percutaneous tibial nerve stimulation option  Today Frequency is stable.  I reviewed the chart and she had a side effect from the oxybutynin and did not want to take Vesicare.  She does have some urgency and mild foot on the floor syndrome  We talked about percutaneous tibial nerve stimulation in detail.  Handout given.  She would like to proceed.  Clinically not infected today.  Nighttime frequency stable    Today Frequency stable-she can still hold it for 3 or 4 hours but when she needs to go it associated with urgency Failed percutaneous tibial nerve stimulation.  Still gets up 3-4 times a night.  Multiple causes discussed.  She is concerned about her dry mouth and were not can try any more antimuscarinics.  She is having some issues with her mouth.  She will try the new beta 3 agonists 75 mg a day with 4 weeks of samples and a prescription.  Reevaluate 6 weeks.  I will discuss desmopressin on that visit otherwise I  reached the end of the algorithm   PMH: Past Medical History:  Diagnosis Date  . Allergy   . Arthritis   . Gastroesophageal reflux   . Hiatal hernia 1989   status post Nissen fundoplication   . Hx of hysterectomy   . Hyperlipidemia   . Tachycardia    Patient stated that this has been occuring frequently.  . Thyroid disease    Goiter    Surgical History: Past Surgical History:  Procedure Laterality Date  . ABDOMINAL HYSTERECTOMY  1980   partial  . BREAST BIOPSY Right    neg  . BREAST EXCISIONAL BIOPSY Left 2014   neg  . COLONOSCOPY  2015  . ESOPHAGOGASTRIC FUNDOPLICATION  1999  . LAPAROSCOPIC NISSEN FUNDOPLICATION  1999  . TONSILLECTOMY     as well as goiter  . UPPER GI ENDOSCOPY  2015    Home Medications:  Allergies as of 06/17/2019      Reactions   Darvocet [propoxyphene N-acetaminophen]    Patient stated that medication made her heart beat fast.   Morphine And Related    Patient stated that medication made her heart beat fast.      Medication List       Accurate as of Jun 17, 2019 11:15 AM. If you have any questions, ask your nurse or doctor.        atorvastatin 10 MG tablet Commonly known as: LIPITOR One tablet q Monday, Wednesday and Friday.   Biotin 1000  MCG tablet Take 1,000 mcg by mouth 3 (three) times daily. Reported on 05/13/2015   CALCIUM 600 PO Take by mouth daily. Reported on 05/13/2015   Epinastine HCl 0.05 % ophthalmic solution Place 1 drop into both eyes 2 (two) times daily as needed.   hydrocortisone 2.5 % cream   neomycin-polymyxin-dexamethasone 0.1 % ophthalmic suspension Commonly known as: MAXITROL Place 1 drop into both eyes 2 (two) times daily.   Olopatadine HCl 0.7 % Soln Commonly known as: Pazeo Place 1 drop into both eyes daily.   omeprazole 20 MG capsule Commonly known as: PRILOSEC Take 20 mg by mouth daily.   vitamin B-12 1000 MCG tablet Commonly known as: CYANOCOBALAMIN Take 1,000 mcg by mouth daily. Reported on  05/13/2015   VITAMIN D PO Take by mouth daily. Reported on 05/13/2015       Allergies:  Allergies  Allergen Reactions  . Darvocet [Propoxyphene N-Acetaminophen]     Patient stated that medication made her heart beat fast.  . Morphine And Related     Patient stated that medication made her heart beat fast.    Family History: Family History  Problem Relation Age of Onset  . Stroke Mother 6  . Arthritis Mother   . Heart disease Mother   . Hypertension Mother   . Stroke Father 86  . Hypertension Father   . Rheumatic fever Brother        and multiple open heart surgeries  . Diabetes Brother        type 2  . Stroke Brother   . Other Sister        Coronary Atherosclerosis  . Stroke Brother   . Stroke Sister   . Heart disease Sister   . Dementia Sister   . Cancer Sister        ovarian    Social History:  reports that she has never smoked. She has never used smokeless tobacco. She reports that she does not drink alcohol or use drugs.  ROS:                                        Physical Exam: BP 137/83   Pulse 81   Ht 5\' 3"  (1.6 m)   Wt 138 lb (62.6 kg)   BMI 24.45 kg/m   Constitutional:  Alert and oriented, No acute distress.   Laboratory Data: Lab Results  Component Value Date   WBC 5.1 05/30/2019   HGB 13.2 05/30/2019   HCT 38.0 05/30/2019   MCV 93.5 05/30/2019   PLT 365.0 05/30/2019    Lab Results  Component Value Date   CREATININE 0.96 05/30/2019    No results found for: PSA  No results found for: TESTOSTERONE  Lab Results  Component Value Date   HGBA1C 5.5 01/28/2019    Urinalysis    Component Value Date/Time   COLORURINE YELLOW 01/15/2018 1437   APPEARANCEUR Clear 12/03/2018 1134   LABSPEC 1.025 01/15/2018 1437   PHURINE 5.5 01/15/2018 1437   GLUCOSEU Negative 12/03/2018 1134   GLUCOSEU NEGATIVE 01/15/2018 1437   HGBUR NEGATIVE 01/15/2018 1437   BILIRUBINUR Negative 12/03/2018 1134   KETONESUR negative  11/06/2018 1139   KETONESUR NEGATIVE 01/15/2018 1437   PROTEINUR Negative 12/03/2018 1134   UROBILINOGEN 0.2 11/06/2018 1139   UROBILINOGEN 0.2 01/15/2018 1437   NITRITE Negative 12/03/2018 1134   NITRITE NEGATIVE 01/15/2018 1437   LEUKOCYTESUR Negative  12/03/2018 1134    Pertinent Imaging:   Assessment & Plan: Reassess in 6 weeks  There are no diagnoses linked to this encounter.  No follow-ups on file.  Reece Packer, MD  [facility] [address] [place], [place] [address] [phone]

## 2019-06-20 LAB — CULTURE, URINE COMPREHENSIVE

## 2019-06-25 ENCOUNTER — Telehealth: Payer: Self-pay | Admitting: Internal Medicine

## 2019-06-25 NOTE — Telephone Encounter (Signed)
Can she come in at 12:00 on Thursday.

## 2019-06-25 NOTE — Telephone Encounter (Signed)
Pt is going to let me know tomorrow if she would like to take appt.

## 2019-06-25 NOTE — Telephone Encounter (Signed)
Pt called and stated that she was having gum issues, wanted to know if Dr.Scott could call her something in. she is using perioxde +

## 2019-06-25 NOTE — Telephone Encounter (Signed)
Will you please triage her and see what is going on. °

## 2019-06-25 NOTE — Telephone Encounter (Signed)
Patient stated she has had sores in her mouth for 2-3 weeks. She is taking oral B mouth sores and orajel to tx sx. It did work but says on bottle if she takes more 10-12 days in a row please contact provider. She did call dentist but unable to be seen until next week. The dentist told her to contact PCP. Patient stated it might be related to her current GI medication. She see GI next week. She would like to know if there is something PCP can give her or does she need an appointment to be evaluated.

## 2019-06-25 NOTE — Telephone Encounter (Signed)
Left message for patient to return call back.  

## 2019-06-27 NOTE — Telephone Encounter (Signed)
Pt saw Dr Troy Sine yesterday and was started on new medication. Stopped prilosec. Will let me know if symptoms do not resolve. Going to set up appt with dentist as well.

## 2019-07-04 ENCOUNTER — Telehealth: Payer: Self-pay | Admitting: Internal Medicine

## 2019-07-04 NOTE — Telephone Encounter (Signed)
Patient called in to mention switching GI doctors. Gave her info for [facility] and [facility]. Patient would like to think about it and let me know for sure before referral is placed.

## 2019-07-04 NOTE — Telephone Encounter (Signed)
Pt called wanting a call from [place]  No details

## 2019-07-15 ENCOUNTER — Telehealth: Payer: Self-pay | Admitting: Internal Medicine

## 2019-07-15 NOTE — Telephone Encounter (Signed)
Pt called and stated that she was not taking her medication for cholesterol because she has ulcer in her mouth.

## 2019-07-15 NOTE — Telephone Encounter (Signed)
Pt wants you to call her on her cell #[phone]

## 2019-07-16 ENCOUNTER — Other Ambulatory Visit: Payer: Medicare Other

## 2019-07-16 NOTE — Telephone Encounter (Signed)
Called patient to discuss ulcers in her mouth. GI has prescribed a mouthwash for her to use 4 times a day. It is helping. She wanted to know if she should continue. Advised that she should contact GI and discuss with them. She is going to call them and let me know if she cannot get in touch.

## 2019-07-19 ENCOUNTER — Other Ambulatory Visit: Payer: Self-pay | Admitting: Physician Assistant

## 2019-07-19 DIAGNOSIS — J384 Edema of larynx: Secondary | ICD-10-CM

## 2019-07-24 ENCOUNTER — Ambulatory Visit
Admission: RE | Admit: 2019-07-24 | Discharge: 2019-07-24 | Disposition: A | Discharge status: Home / Self Care | Payer: Medicare Other | Source: Ambulatory Visit | Attending: Physician Assistant | Admitting: Physician Assistant

## 2019-07-24 ENCOUNTER — Other Ambulatory Visit: Payer: Self-pay

## 2019-07-24 DIAGNOSIS — J384 Edema of larynx: Secondary | ICD-10-CM | POA: Insufficient documentation

## 2019-07-24 LAB — POCT I-STAT CREATININE: Creatinine, Ser: 1.1 mg/dL — ABNORMAL HIGH (ref 0.44–1.00)

## 2019-07-24 MED ORDER — IOHEXOL 300 MG/ML  SOLN
75.0000 mL | Freq: Once | INTRAMUSCULAR | Status: AC | PRN
  Administered 2019-07-24: 75 mL via INTRAVENOUS

## 2019-07-26 ENCOUNTER — Telehealth: Payer: Self-pay | Admitting: Internal Medicine

## 2019-07-26 NOTE — Telephone Encounter (Signed)
Pt called about CT results from 07/24/19

## 2019-07-26 NOTE — Telephone Encounter (Signed)
ENT ordered scan. Advised patient that they should be calling her with the results.

## 2019-08-06 ENCOUNTER — Other Ambulatory Visit: Payer: Medicare Other

## 2019-08-12 ENCOUNTER — Ambulatory Visit: Payer: Self-pay | Admitting: Urology

## 2019-09-05 ENCOUNTER — Ambulatory Visit: Payer: Medicare Other | Admitting: Internal Medicine

## 2019-09-25 ENCOUNTER — Encounter: Payer: Self-pay | Admitting: Internal Medicine

## 2019-09-25 ENCOUNTER — Other Ambulatory Visit: Payer: Self-pay

## 2019-09-25 ENCOUNTER — Ambulatory Visit (INDEPENDENT_AMBULATORY_CARE_PROVIDER_SITE_OTHER): Payer: Medicare Other | Admitting: Internal Medicine

## 2019-09-25 VITALS — BP 112/72 | HR 70 | Temp 98.6°F | Ht 62.99 in | Wt 134.0 lb

## 2019-09-25 DIAGNOSIS — F439 Reaction to severe stress, unspecified: Secondary | ICD-10-CM

## 2019-09-25 DIAGNOSIS — E7800 Pure hypercholesterolemia, unspecified: Secondary | ICD-10-CM

## 2019-09-25 DIAGNOSIS — K2270 Barrett's esophagus without dysplasia: Secondary | ICD-10-CM

## 2019-09-25 DIAGNOSIS — R739 Hyperglycemia, unspecified: Secondary | ICD-10-CM

## 2019-09-25 DIAGNOSIS — E559 Vitamin D deficiency, unspecified: Secondary | ICD-10-CM | POA: Diagnosis not present

## 2019-09-25 DIAGNOSIS — R634 Abnormal weight loss: Secondary | ICD-10-CM

## 2019-09-25 DIAGNOSIS — E041 Nontoxic single thyroid nodule: Secondary | ICD-10-CM | POA: Diagnosis not present

## 2019-09-25 DIAGNOSIS — K219 Gastro-esophageal reflux disease without esophagitis: Secondary | ICD-10-CM

## 2019-09-25 DIAGNOSIS — N6489 Other specified disorders of breast: Secondary | ICD-10-CM

## 2019-09-25 DIAGNOSIS — R29898 Other symptoms and signs involving the musculoskeletal system: Secondary | ICD-10-CM

## 2019-09-25 NOTE — Progress Notes (Signed)
Patient ID: Carrie Noble, female   DOB: 26-Feb-1940, 79 y.o.   MRN: [id number]   Subjective:    Patient ID: Carrie Noble, female    DOB: [birth year], 79 y.o.   MRN: [id number]  HPI This visit occurred during the SARS-CoV-2 public health emergency.  Safety protocols were in place, including screening questions prior to the visit, additional usage of staff PPE, and extensive cleaning of exam room while observing appropriate contact time as indicated for disinfecting solutions.  Patient here for a scheduled follow up.  She reports increased stress - dealing with husband's health issues.  He has been diagnosed with myasthenia gravis.  Discussed with her today.  Overall she feels she is handling things relatively well.  Does report decreased energy.  No chest pain with increased activity or exertion - reported.  Does occasionally noticed chest discomfort when lying down.  Still with presumed acid reflux issues.  Varying how she takes pepcid and protonix.  Discussed taking scheduled.  Saw ENT - placed on abx, steroids and pepcid.  abx caused diarrhea.  Better now.  Did not take steroids.  Has lost weight.  Reports some decreased appetite.  No vomiting.  No abdominal pain.  Declines covid vaccine.  Does report leg weakness.  Affecting her activity.  Limits her walking to mailbox - legs stiff.    Past Medical History:  Diagnosis Date  . Allergy   . Arthritis   . Gastroesophageal reflux   . Hiatal hernia 1989   status post Nissen fundoplication   . Hx of hysterectomy   . Hyperlipidemia   . Tachycardia    Patient stated that this has been occuring frequently.  . Thyroid disease    Goiter   Past Surgical History:  Procedure Laterality Date  . ABDOMINAL HYSTERECTOMY  1980   partial  . BREAST BIOPSY Right    neg  . BREAST EXCISIONAL BIOPSY Left 2014   neg  . COLONOSCOPY  2015  . ESOPHAGOGASTRIC FUNDOPLICATION  1999  . LAPAROSCOPIC NISSEN FUNDOPLICATION  1999  . TONSILLECTOMY     as well as  goiter  . UPPER GI ENDOSCOPY  2015   Family History  Problem Relation Age of Onset  . Stroke Mother 63  . Arthritis Mother   . Heart disease Mother   . Hypertension Mother   . Stroke Father 4  . Hypertension Father   . Rheumatic fever Brother        and multiple open heart surgeries  . Diabetes Brother        type 2  . Stroke Brother   . Other Sister        Coronary Atherosclerosis  . Stroke Brother   . Stroke Sister   . Heart disease Sister   . Dementia Sister   . Cancer Sister        ovarian   Social History   Socioeconomic History  . Marital status: Married    Spouse name: Not on file  . Number of children: Not on file  . Years of education: Not on file  . Highest education level: Not on file  Occupational History  . Occupation: Retired    Associate Professor: [employer]  Tobacco Use  . Smoking status: Never Smoker  . Smokeless tobacco: Never Used  Substance and Sexual Activity  . Alcohol use: No    Alcohol/week: 0.0 standard drinks  . Drug use: No  . Sexual activity: Never  Other Topics Concern  . Not on file  Social History Narrative   Occasionally drinks coffee.   Social Determinants of Health   Financial Resource Strain:   . Difficulty of Paying Living Expenses: Not on file  Food Insecurity:   . Worried About Programme researcher, broadcasting/film/video in the Last Year: Not on file  . Ran Out of Food in the Last Year: Not on file  Transportation Needs:   . Lack of Transportation (Medical): Not on file  . Lack of Transportation (Non-Medical): Not on file  Physical Activity:   . Days of Exercise per Week: Not on file  . Minutes of Exercise per Session: Not on file  Stress:   . Feeling of Stress : Not on file  Social Connections:   . Frequency of Communication with Friends and Family: Not on file  . Frequency of Social Gatherings with Friends and Family: Not on file  . Attends Religious Services: Not on file  . Active Member of Clubs or Organizations: Not on file  . Attends Tax inspector Meetings: Not on file  . Marital Status: Not on file    Outpatient Encounter Medications as of 09/25/2019  Medication Sig  . atorvastatin (LIPITOR) 10 MG tablet One tablet q Monday, Wednesday and Friday.  . Biotin 1000 MCG tablet Take 1,000 mcg by mouth 3 (three) times daily. Reported on 05/13/2015  . Calcium Carbonate (CALCIUM 600 PO) Take by mouth daily. Reported on 05/13/2015  . Cholecalciferol (VITAMIN D PO) Take by mouth daily. Reported on 05/13/2015  . Epinastine HCl 0.05 % ophthalmic solution Place 1 drop into both eyes 2 (two) times daily as needed.  . famotidine (PEPCID) 20 MG tablet Take 20 mg by mouth at bedtime.  . famotidine (PEPCID) 40 MG tablet Take 40 mg by mouth at bedtime.  . hydrocortisone 2.5 % cream   . neomycin-polymyxin-dexamethasone (MAXITROL) 0.1 % ophthalmic suspension Place 1 drop into both eyes 2 (two) times daily.  . Olopatadine HCl (PAZEO) 0.7 % SOLN Place 1 drop into both eyes daily.  Marland Kitchen omeprazole (PRILOSEC) 20 MG capsule Take 20 mg by mouth daily.  . pantoprazole (PROTONIX) 40 MG tablet Take 40 mg by mouth every morning.  Bertram Gala Glycol-Propyl Glycol 0.4-0.3 % SOLN Apply to eye.  . triamcinolone (KENALOG) 0.1 % paste SMARTSIG:Sparingly TO TEETH Twice Daily  . Vibegron 75 MG TABS Take 75 mg by mouth daily.  . vitamin B-12 (CYANOCOBALAMIN) 1000 MCG tablet Take 1,000 mcg by mouth daily. Reported on 05/13/2015   Facility-Administered Encounter Medications as of 09/25/2019  Medication  . betamethasone acetate-betamethasone sodium phosphate (CELESTONE) injection 3 mg  . betamethasone acetate-betamethasone sodium phosphate (CELESTONE) injection 3 mg   Outpatient Encounter Medications as of 09/25/2019  Medication Sig  . atorvastatin (LIPITOR) 10 MG tablet One tablet q Monday, Wednesday and Friday.  . Biotin 1000 MCG tablet Take 1,000 mcg by mouth 3 (three) times daily. Reported on 05/13/2015  . Calcium Carbonate (CALCIUM 600 PO) Take by mouth daily.  Reported on 05/13/2015  . Cholecalciferol (VITAMIN D PO) Take by mouth daily. Reported on 05/13/2015  . Epinastine HCl 0.05 % ophthalmic solution Place 1 drop into both eyes 2 (two) times daily as needed.  . famotidine (PEPCID) 20 MG tablet Take 20 mg by mouth at bedtime.  . famotidine (PEPCID) 40 MG tablet Take 40 mg by mouth at bedtime.  . hydrocortisone 2.5 % cream   . neomycin-polymyxin-dexamethasone (MAXITROL) 0.1 % ophthalmic suspension Place 1 drop into both eyes 2 (two) times daily.  Marland Kitchen  Olopatadine HCl (PAZEO) 0.7 % SOLN Place 1 drop into both eyes daily.  Marland Kitchen omeprazole (PRILOSEC) 20 MG capsule Take 20 mg by mouth daily.  . pantoprazole (PROTONIX) 40 MG tablet Take 40 mg by mouth every morning.  Bertram Gala Glycol-Propyl Glycol 0.4-0.3 % SOLN Apply to eye.  . triamcinolone (KENALOG) 0.1 % paste SMARTSIG:Sparingly TO TEETH Twice Daily  . Vibegron 75 MG TABS Take 75 mg by mouth daily.  . vitamin B-12 (CYANOCOBALAMIN) 1000 MCG tablet Take 1,000 mcg by mouth daily. Reported on 05/13/2015   Facility-Administered Encounter Medications as of 09/25/2019  Medication  . betamethasone acetate-betamethasone sodium phosphate (CELESTONE) injection 3 mg  . betamethasone acetate-betamethasone sodium phosphate (CELESTONE) injection 3 mg    Review of Systems  Constitutional:       Has lost weight.  Some decreased appetite.    HENT: Negative for congestion and sinus pressure.   Respiratory: Negative for cough, chest tightness and shortness of breath.   Cardiovascular: Negative for palpitations and leg swelling.       No chest pain with increased activity or exertion.   Gastrointestinal: Negative for abdominal pain, nausea and vomiting.       No diarrhea now.   Genitourinary: Negative for difficulty urinating and dysuria.  Musculoskeletal: Negative for joint swelling and myalgias.  Skin: Negative for color change and rash.  Neurological: Negative for dizziness, light-headedness and headaches.    Psychiatric/Behavioral: Negative for agitation and dysphoric mood.       Increased stress.         Objective:    Physical Exam Vitals reviewed.  Constitutional:      General: She is not in acute distress.    Appearance: Normal appearance.  HENT:     Head: Normocephalic and atraumatic.     Right Ear: External ear normal.     Left Ear: External ear normal.  Eyes:     General: No scleral icterus.       Right eye: No discharge.        Left eye: No discharge.     Conjunctiva/sclera: Conjunctivae normal.  Neck:     Thyroid: No thyromegaly.  Cardiovascular:     Rate and Rhythm: Normal rate and regular rhythm.  Pulmonary:     Effort: No respiratory distress.     Breath sounds: Normal breath sounds. No wheezing.     Comments: Breasts:  No nipple discharge or nipple retraction.  Palpable fullness right breast 10-12:00.  No other palpable nodules or axillary adenopathy.   Abdominal:     General: Bowel sounds are normal.     Palpations: Abdomen is soft.     Tenderness: There is no abdominal tenderness.  Musculoskeletal:        General: No swelling or tenderness.     Cervical back: Neck supple. No tenderness.  Lymphadenopathy:     Cervical: No cervical adenopathy.  Skin:    Findings: No erythema or rash.  Neurological:     Mental Status: She is alert.  Psychiatric:        Mood and Affect: Mood normal.        Behavior: Behavior normal.     BP 112/72 (BP Location: Left Arm, Patient Position: Sitting)   Pulse 70   Temp 98.6 F (37 C)   Ht 5' 2.99" (1.6 m)   Wt 134 lb (60.8 kg)   SpO2 97%   BMI 23.74 kg/m  Wt Readings from Last 3 Encounters:  09/25/19 134 lb (60.8 kg)  06/17/19 138 lb (62.6 kg)  06/03/19 141 lb (64 kg)     Lab Results  Component Value Date   WBC 5.1 05/30/2019   HGB 13.2 05/30/2019   HCT 38.0 05/30/2019   PLT 365.0 05/30/2019   GLUCOSE 91 05/30/2019   CHOL 247 (H) 05/30/2019   TRIG 83.0 05/30/2019   HDL 61.00 05/30/2019   LDLDIRECT 141.0  09/01/2016   LDLCALC 169 (H) 05/30/2019   ALT 16 05/30/2019   AST 19 05/30/2019   NA 138 05/30/2019   K 4.1 05/30/2019   CL 103 05/30/2019   CREATININE 1.10 (H) 07/24/2019   BUN 18 05/30/2019   CO2 29 05/30/2019   TSH 3.74 05/30/2019   HGBA1C 5.5 01/28/2019    CT SOFT TISSUE NECK W CONTRAST  Result Date: 07/24/2019 CLINICAL DATA:  79 year old female with multiple mouth ulcers, "Swelling on larynx" on in disc optic evaluation by ENT. EXAM: CT NECK WITH CONTRAST TECHNIQUE: Multidetector CT imaging of the neck was performed using the standard protocol following the bolus administration of intravenous contrast. CONTRAST:  32mL OMNIPAQUE IOHEXOL 300 MG/ML  SOLN COMPARISON:  Thyroid ultrasound 12/16/2014.  Brain MRI 10/20/2006. FINDINGS: Pharynx and larynx: Laryngeal soft tissue contours are within normal limits. No discrete laryngeal mass or hyperenhancement is identified. The epiglottis appears normal. Superimposed enhancing hypertrophy of the lingual tonsil at the vallecula, with a small superimposed postinflammatory calcification near midline (series 2, image 43). Otherwise the hypopharynx is within normal limits. Oropharynx and nasopharynx contours are within normal limits. The palatine tonsils may be surgically absent. Negative parapharyngeal and retropharyngeal spaces. No oral tongue or oral cavity abnormality is evident. Salivary glands: Negative sublingual space, visible sublingual glands, bilateral submandibular and parotid glands. Thyroid: Chronic thyromegaly, most recently evaluated by ultrasound in 2016. Multinodular and heterogeneous appearance of both lobes, the right lobe is larger. No significant airway narrowing. No regional lymphadenopathy. Lymph nodes: Negative.  No cervical lymphadenopathy. Vascular: The major vascular structures in the neck and at the skull base are patent. There is a partially retropharyngeal course of the left carotid. Mild for age cervical carotid atherosclerosis.  Limited intracranial: Negative. Visualized orbits: Negative. Mastoids and visualized paranasal sinuses: Clear. Skeleton: Cervical spine degeneration mostly in the form of facet arthropathy, although overall mild for age. No acute osseous abnormality identified. Upper chest: Negative. IMPRESSION: 1. Lingual tonsil hypertrophy, but no discrete laryngeal or pharyngeal mass or abnormality identified. 2. Chronic thyroid goiter, most recently evaluated by Ultrasound in 2016. Electronically Signed   By: Odessa Fleming M.D.   On: 07/24/2019 13:10       Assessment & Plan:   Problem List Items Addressed This Visit    Weight loss    Encourage increased po intake.  Treat with protonix and pepcid as outlined.  Follow up soon to reassess.        Relevant Orders   CBC with Differential/Platelet   TSH   Vitamin D deficiency    Follow vitamin D level.       Thyroid nodule    Being followed by endocrinology.  Last visit, stable, recommended f/u thyroid ultrasound in one year.       Stress    Increased stress as outlined.  Discussed with her today.  Desires no further intervention at this time.  Follow.        Leg weakness    Describes leg weakness as outlined.  No focal weakness.  Discussed therapy.        Hypercholesterolemia  On lipitor.  Follow lipid panel and liver function tests.        Relevant Orders   Hepatic function panel   Lipid panel   Basic metabolic panel   GERD (gastroesophageal reflux disease)    Has seen Dr Troy SineMedhoff.  Recommended protonix and pepcid.  Has seen ENT.  Varying how she is taking the medication.  Trial of protonix in am and pepcid in pm.  Call with update.        Relevant Medications   pantoprazole (PROTONIX) 40 MG tablet   famotidine (PEPCID) 40 MG tablet   famotidine (PEPCID) 20 MG tablet   Fullness of breast    Right breast - fullness 10-12:00.  Schedule diagnostic mammogram.  Further w/up pending results.        Relevant Orders   MM DIAG BREAST TOMO UNI  RIGHT   US BREAST LTD UNI RIGHT INC AXILLA   Barrett's esophagus    Recently evaluated by Dr Troy SineMedhoff.  Recommended protonix and pepcid.  She is varying her medication.  Trial of protonix 20mg  in am and pepcid 20mg  in evening.  Follow.         Other Visit Diagnoses    Hyperglycemia    -  Primary   Relevant Orders   Hemoglobin A1c      I spent 45 minutes with the patient and more than 50% of the time was spent in consultation regarding the above.  Time spent discussing her current symptoms and concerns.  Time also spent discussing further w/up and evaluation and treatment.   Dale Durhamharlene Overton Boggus, MD

## 2019-09-25 NOTE — Patient Instructions (Signed)
Omeprazole 20mg  - take 30 minutes before breakfast  pepcid (famotidine) 20mg  - take one tablet 30 minutes before your evening meal.

## 2019-09-27 ENCOUNTER — Telehealth: Payer: Self-pay | Admitting: Internal Medicine

## 2019-09-27 NOTE — Telephone Encounter (Signed)
Noted  

## 2019-09-27 NOTE — Telephone Encounter (Signed)
Patient is going to keep her appointment with Dr. Gavin Potters. At this time patient would like to hold off on physical therapy until she sees Dr. Gavin Potters on 10/01/19.

## 2019-09-30 ENCOUNTER — Encounter: Payer: Self-pay | Admitting: Internal Medicine

## 2019-09-30 DIAGNOSIS — N6489 Other specified disorders of breast: Secondary | ICD-10-CM | POA: Insufficient documentation

## 2019-09-30 DIAGNOSIS — R634 Abnormal weight loss: Secondary | ICD-10-CM | POA: Insufficient documentation

## 2019-09-30 NOTE — Assessment & Plan Note (Signed)
Encourage increased po intake.  Treat with protonix and pepcid as outlined.  Follow up soon to reassess.

## 2019-09-30 NOTE — Assessment & Plan Note (Signed)
Right breast - fullness 10-12:00.  Schedule diagnostic mammogram.  Further w/up pending results.

## 2019-09-30 NOTE — Assessment & Plan Note (Signed)
Recently evaluated by Dr Troy Sine.  Recommended protonix and pepcid.  She is varying her medication.  Trial of protonix 20mg  in am and pepcid 20mg  in evening.  Follow.

## 2019-09-30 NOTE — Assessment & Plan Note (Signed)
Describes leg weakness as outlined.  No focal weakness.  Discussed therapy.

## 2019-09-30 NOTE — Assessment & Plan Note (Signed)
Has seen Dr Troy Sine.  Recommended protonix and pepcid.  Has seen ENT.  Varying how she is taking the medication.  Trial of protonix in am and pepcid in pm.  Call with update.

## 2019-09-30 NOTE — Assessment & Plan Note (Signed)
Being followed by endocrinology.  Last visit, stable, recommended f/u thyroid ultrasound in one year.

## 2019-09-30 NOTE — Assessment & Plan Note (Signed)
Follow vitamin D level.  

## 2019-09-30 NOTE — Assessment & Plan Note (Signed)
On lipitor.  Follow lipid panel and liver function tests.   

## 2019-09-30 NOTE — Assessment & Plan Note (Signed)
Increased stress as outlined.  Discussed with her today.  Desires no further intervention at this time.  Follow.   

## 2019-10-01 ENCOUNTER — Telehealth: Payer: Self-pay | Admitting: Internal Medicine

## 2019-10-01 NOTE — Telephone Encounter (Signed)
I lft vm for pt to call ofc to get her availability to [facility] Diagnostic mamm[facility].

## 2019-10-09 ENCOUNTER — Ambulatory Visit
Admission: RE | Admit: 2019-10-09 | Discharge: 2019-10-09 | Disposition: A | Discharge status: Home / Self Care | Payer: Medicare Other | Source: Ambulatory Visit | Attending: Internal Medicine | Admitting: Internal Medicine

## 2019-10-09 ENCOUNTER — Other Ambulatory Visit: Payer: Self-pay

## 2019-10-09 DIAGNOSIS — N6489 Other specified disorders of breast: Secondary | ICD-10-CM

## 2019-10-10 ENCOUNTER — Telehealth: Payer: Self-pay | Admitting: Internal Medicine

## 2019-10-10 NOTE — Telephone Encounter (Signed)
Needs f/u appt scheduled over the next 4-6 weeks.  Thanks.

## 2019-10-10 NOTE — Telephone Encounter (Signed)
LM with appt date and time.

## 2019-11-22 ENCOUNTER — Ambulatory Visit: Payer: Medicare Other | Admitting: Internal Medicine

## 2019-12-31 ENCOUNTER — Other Ambulatory Visit: Payer: Self-pay | Admitting: Gastroenterology

## 2019-12-31 DIAGNOSIS — K219 Gastro-esophageal reflux disease without esophagitis: Secondary | ICD-10-CM

## 2020-01-07 ENCOUNTER — Ambulatory Visit: Payer: Medicare Other

## 2020-01-08 ENCOUNTER — Other Ambulatory Visit: Payer: Self-pay

## 2020-01-08 ENCOUNTER — Ambulatory Visit
Admission: RE | Admit: 2020-01-08 | Discharge: 2020-01-08 | Disposition: A | Discharge status: Home / Self Care | Payer: Medicare Other | Source: Ambulatory Visit | Attending: Gastroenterology | Admitting: Gastroenterology

## 2020-01-08 DIAGNOSIS — K219 Gastro-esophageal reflux disease without esophagitis: Secondary | ICD-10-CM | POA: Insufficient documentation

## 2020-01-28 ENCOUNTER — Telehealth: Payer: Self-pay | Admitting: Internal Medicine

## 2020-01-28 NOTE — Telephone Encounter (Signed)
Patient called in about medicare wellness appt will call back was not at home

## 2020-01-28 NOTE — Telephone Encounter (Signed)
Left message for patient to call back and schedule Medicare Annual Wellness Visit (AWV)   This should be a telephone visit only=30 minutes.  Last AWV 10/31/16; please schedule at anytime with Denisa O'Brien-Blaney at [facility].

## 2020-01-31 ENCOUNTER — Other Ambulatory Visit: Payer: Self-pay | Admitting: Internal Medicine

## 2020-01-31 DIAGNOSIS — Z1231 Encounter for screening mammogram for malignant neoplasm of breast: Secondary | ICD-10-CM

## 2020-02-11 ENCOUNTER — Other Ambulatory Visit: Payer: Medicare Other

## 2020-02-12 ENCOUNTER — Other Ambulatory Visit: Payer: Self-pay

## 2020-02-12 ENCOUNTER — Ambulatory Visit (INDEPENDENT_AMBULATORY_CARE_PROVIDER_SITE_OTHER): Payer: Medicare Other | Admitting: Internal Medicine

## 2020-02-12 VITALS — BP 138/70 | HR 74 | Temp 98.4°F | Resp 16 | Ht 63.0 in | Wt 132.4 lb

## 2020-02-12 DIAGNOSIS — R739 Hyperglycemia, unspecified: Secondary | ICD-10-CM | POA: Diagnosis not present

## 2020-02-12 DIAGNOSIS — E7800 Pure hypercholesterolemia, unspecified: Secondary | ICD-10-CM | POA: Diagnosis not present

## 2020-02-12 DIAGNOSIS — F439 Reaction to severe stress, unspecified: Secondary | ICD-10-CM

## 2020-02-12 DIAGNOSIS — E559 Vitamin D deficiency, unspecified: Secondary | ICD-10-CM

## 2020-02-12 DIAGNOSIS — R350 Frequency of micturition: Secondary | ICD-10-CM | POA: Diagnosis not present

## 2020-02-12 DIAGNOSIS — R634 Abnormal weight loss: Secondary | ICD-10-CM | POA: Diagnosis not present

## 2020-02-12 DIAGNOSIS — K219 Gastro-esophageal reflux disease without esophagitis: Secondary | ICD-10-CM

## 2020-02-12 DIAGNOSIS — R682 Dry mouth, unspecified: Secondary | ICD-10-CM | POA: Diagnosis not present

## 2020-02-12 DIAGNOSIS — E041 Nontoxic single thyroid nodule: Secondary | ICD-10-CM

## 2020-02-12 LAB — CBC WITH DIFFERENTIAL/PLATELET
Basophils Absolute: 0.1 10*3/uL (ref 0.0–0.1)
Basophils Relative: 1 % (ref 0.0–3.0)
Eosinophils Absolute: 0.1 10*3/uL (ref 0.0–0.7)
Eosinophils Relative: 2.5 % (ref 0.0–5.0)
HCT: 39.3 % (ref 36.0–46.0)
Hemoglobin: 13.5 g/dL (ref 12.0–15.0)
Lymphocytes Relative: 33.1 % (ref 12.0–46.0)
Lymphs Abs: 2 10*3/uL (ref 0.7–4.0)
MCHC: 34.4 g/dL (ref 30.0–36.0)
MCV: 92.3 fl (ref 78.0–100.0)
Monocytes Absolute: 0.4 10*3/uL (ref 0.1–1.0)
Monocytes Relative: 6.8 % (ref 3.0–12.0)
Neutro Abs: 3.4 10*3/uL (ref 1.4–7.7)
Neutrophils Relative %: 56.6 % (ref 43.0–77.0)
Platelets: 340 10*3/uL (ref 150.0–400.0)
RBC: 4.26 Mil/uL (ref 3.87–5.11)
RDW: 13.6 % (ref 11.5–15.5)
WBC: 6 10*3/uL (ref 4.0–10.5)

## 2020-02-12 LAB — LIPID PANEL
Cholesterol: 257 mg/dL — ABNORMAL HIGH (ref 0–200)
HDL: 68.9 mg/dL (ref >39.00)
LDL Cholesterol: 166 mg/dL — ABNORMAL HIGH (ref 0–99)
NonHDL: 188.35
Total CHOL/HDL Ratio: 4
Triglycerides: 114 mg/dL (ref 0.0–149.0)
VLDL: 22.8 mg/dL (ref 0.0–40.0)

## 2020-02-12 LAB — HEPATIC FUNCTION PANEL
ALT: 15 U/L (ref 0–35)
AST: 19 U/L (ref 0–37)
Albumin: 4.7 g/dL (ref 3.5–5.2)
Alkaline Phosphatase: 121 U/L — ABNORMAL HIGH (ref 39–117)
Bilirubin, Direct: 0.1 mg/dL (ref 0.0–0.3)
Total Bilirubin: 0.8 mg/dL (ref 0.2–1.2)
Total Protein: 7.3 g/dL (ref 6.0–8.3)

## 2020-02-12 LAB — BASIC METABOLIC PANEL
BUN: 15 mg/dL (ref 6–23)
CO2: 29 mEq/L (ref 19–32)
Calcium: 9.6 mg/dL (ref 8.4–10.5)
Chloride: 102 mEq/L (ref 96–112)
Creatinine, Ser: 0.92 mg/dL (ref 0.40–1.20)
GFR: 58.7 mL/min — ABNORMAL LOW (ref >60.00)
Glucose, Bld: 91 mg/dL (ref 70–99)
Potassium: 4 mEq/L (ref 3.5–5.1)
Sodium: 138 mEq/L (ref 135–145)

## 2020-02-12 LAB — HEMOGLOBIN A1C: Hgb A1c MFr Bld: 5.6 % (ref 4.6–6.5)

## 2020-02-12 LAB — TSH: TSH: 3.26 u[IU]/mL (ref 0.35–4.50)

## 2020-02-12 LAB — SEDIMENTATION RATE: Sed Rate: 22 mm/hr (ref 0–30)

## 2020-02-12 MED ORDER — SERTRALINE HCL 25 MG PO TABS: 25.0000 mg | ORAL_TABLET | Freq: Every day | ORAL | 1 refills | Status: DC

## 2020-02-12 NOTE — Progress Notes (Unsigned)
Patient ID: Carrie Noble, female   DOB: Jun 03, 1940, 80 y.o.   MRN: [id number]   Subjective:    Patient ID: Carrie Noble, female    DOB: [birth year], 80 y.o.   MRN: [id number]  HPI This visit occurred during the SARS-CoV-2 public health emergency.  Safety protocols were in place, including screening questions prior to the visit, additional usage of staff PPE, and extensive cleaning of exam room while observing appropriate contact time as indicated for disinfecting solutions.  Patient here for a scheduled follow up.  Increased stress.  Husband fell.  Brother in hospice.  Discussed with her.  She feels she needs something to help level things out.  Discussed medication.  She is also concerned about increased dry mouth.  Persistent.  Has seen her dentist.  Discussed saliva max.  She is off antihistamine.  Discussed given persistence - referral to ENT.  Tries to stay active.  No chest pain or sob reported.  Saw GI 12/31/19.  Instructed to increase PPI to bid. She was concerned given her dry mouth - about taking this dose.  Only taking 54m q day omeprazole.  Had UGI - moderate reflux and small hiatal hernia.  Some acid reflux.  Discussed increasing omeprazole to bid.  Some decreased appetite.  No nausea or vomiting.  Bowels moving.    Past Medical History:  Diagnosis Date  . Allergy   . Arthritis   . Gastroesophageal reflux   . Hiatal hernia 1989   status post Nissen fundoplication   . Hx of hysterectomy   . Hyperlipidemia   . Tachycardia    Patient stated that this has been occuring frequently.  . Thyroid disease    Goiter   Past Surgical History:  Procedure Laterality Date  . ABDOMINAL HYSTERECTOMY  1980   partial  . BREAST BIOPSY Right    neg  . BREAST EXCISIONAL BIOPSY Left 2014   neg  . COLONOSCOPY  2015  . ESOPHAGOGASTRIC FUNDOPLICATION  10762 . LAPAROSCOPIC NISSEN FUNDOPLICATION  12633 . TONSILLECTOMY     as well as goiter  . UPPER GI ENDOSCOPY  2015   Family History  Problem  Relation Age of Onset  . Stroke Mother 883 . Arthritis Mother   . Heart disease Mother   . Hypertension Mother   . Stroke Father 69 . Hypertension Father   . Rheumatic fever Brother        and multiple open heart surgeries  . Diabetes Brother        type 2  . Stroke Brother   . Other Sister        Coronary Atherosclerosis  . Stroke Brother   . Stroke Sister   . Heart disease Sister   . Dementia Sister   . Cancer Sister        ovarian  . Breast cancer Neg Hx    Social History   Socioeconomic History  . Marital status: Married    Spouse name: Not on file  . Number of children: Not on file  . Years of education: Not on file  . Highest education level: Not on file  Occupational History  . Occupation: Retired    [employer] farm manager [employer]  Tobacco Use  . Smoking status: Never Smoker  . Smokeless tobacco: Never Used  Substance and Sexual Activity  . Alcohol use: No    Alcohol/week: 0.0 standard drinks  . Drug use: No  . Sexual activity: Never  Other Topics Concern  .  Not on file  Social History Narrative   Occasionally drinks coffee.   Social Determinants of Health   Financial Resource Strain: Not on file  Food Insecurity: Not on file  Transportation Needs: Not on file  Physical Activity: Not on file  Stress: Not on file  Social Connections: Not on file    Outpatient Encounter Medications as of 02/12/2020  Medication Sig  . sertraline (ZOLOFT) 25 MG tablet Take 1 tablet (25 mg total) by mouth daily.  Marland Kitchen atorvastatin (LIPITOR) 10 MG tablet One tablet q Monday, Wednesday and Friday.  . Biotin 1000 MCG tablet Take 1,000 mcg by mouth 3 (three) times daily. Reported on 05/13/2015  . Calcium Carbonate (CALCIUM 600 PO) Take by mouth daily. Reported on 05/13/2015  . Cholecalciferol (VITAMIN D PO) Take by mouth daily. Reported on 05/13/2015  . Epinastine HCl 0.05 % ophthalmic solution Place 1 drop into both eyes 2 (two) times daily as needed.  . famotidine (PEPCID) 20 MG tablet Take  20 mg by mouth at bedtime.  . famotidine (PEPCID) 40 MG tablet Take 40 mg by mouth at bedtime.  . hydrocortisone 2.5 % cream   . neomycin-polymyxin-dexamethasone (MAXITROL) 0.1 % ophthalmic suspension Place 1 drop into both eyes 2 (two) times daily.  . Olopatadine HCl (PAZEO) 0.7 % SOLN Place 1 drop into both eyes daily.  Marland Kitchen omeprazole (PRILOSEC) 20 MG capsule Take 20 mg by mouth daily.  . pantoprazole (PROTONIX) 40 MG tablet Take 40 mg by mouth every morning.  Vladimir Faster Glycol-Propyl Glycol 0.4-0.3 % SOLN Apply to eye.  . triamcinolone (KENALOG) 0.1 % paste SMARTSIG:Sparingly TO TEETH Twice Daily  . Vibegron 75 MG TABS Take 75 mg by mouth daily.  . vitamin B-12 (CYANOCOBALAMIN) 1000 MCG tablet Take 1,000 mcg by mouth daily. Reported on 05/13/2015   Facility-Administered Encounter Medications as of 02/12/2020  Medication  . betamethasone acetate-betamethasone sodium phosphate (CELESTONE) injection 3 mg  . betamethasone acetate-betamethasone sodium phosphate (CELESTONE) injection 3 mg    Review of Systems  Constitutional:       Some decreased appetite.  Weight decreased.    HENT: Negative for congestion and sinus pressure.   Respiratory: Negative for cough, chest tightness and shortness of breath.   Cardiovascular: Negative for chest pain, palpitations and leg swelling.  Gastrointestinal: Negative for abdominal pain, diarrhea, nausea and vomiting.  Genitourinary: Negative for difficulty urinating and dysuria.  Musculoskeletal: Negative for joint swelling and myalgias.  Neurological: Negative for dizziness, light-headedness and headaches.  Psychiatric/Behavioral: Negative for agitation and dysphoric mood.       Increased stress as outlined.        Objective:    Physical Exam Vitals reviewed.  Constitutional:      General: She is not in acute distress.    Appearance: Normal appearance.  HENT:     Head: Normocephalic and atraumatic.     Right Ear: External ear normal.     Left  Ear: External ear normal.     Mouth/Throat:     Mouth: Oropharynx is clear and moist.  Eyes:     General: No scleral icterus.       Right eye: No discharge.        Left eye: No discharge.     Conjunctiva/sclera: Conjunctivae normal.  Neck:     Thyroid: No thyromegaly.  Cardiovascular:     Rate and Rhythm: Normal rate and regular rhythm.  Pulmonary:     Effort: No respiratory distress.     Breath  sounds: Normal breath sounds. No wheezing.  Abdominal:     General: Bowel sounds are normal.     Palpations: Abdomen is soft.     Tenderness: There is no abdominal tenderness.  Musculoskeletal:        General: No swelling, tenderness or edema.     Cervical back: Neck supple. No tenderness.  Lymphadenopathy:     Cervical: No cervical adenopathy.  Skin:    Findings: No erythema or rash.  Neurological:     Mental Status: She is alert.  Psychiatric:        Mood and Affect: Mood normal.        Behavior: Behavior normal.     BP 138/70   Pulse 74   Temp 98.4 F (36.9 C) (Oral)   Resp 16   Ht _0  (1.6 m)   Wt 132 lb 6.4 oz (60.1 kg)   SpO2 98%   BMI 23.45 kg/m  Wt Readings from Last 3 Encounters:  02/12/20 132 lb 6.4 oz (60.1 kg)  09/25/19 134 lb (60.8 kg)  06/17/19 138 lb (62.6 kg)     Lab Results  Component Value Date   WBC 6.0 02/12/2020   HGB 13.5 02/12/2020   HCT 39.3 02/12/2020   PLT 340.0 02/12/2020   GLUCOSE 91 02/12/2020   CHOL 257 (H) 02/12/2020   TRIG 114.0 02/12/2020   HDL 68.90 02/12/2020   LDLDIRECT 141.0 09/01/2016   LDLCALC 166 (H) 02/12/2020   ALT 15 02/12/2020   AST 19 02/12/2020   NA 138 02/12/2020   K 4.0 02/12/2020   CL 102 02/12/2020   CREATININE 0.92 02/12/2020   BUN 15 02/12/2020   CO2 29 02/12/2020   TSH 3.26 02/12/2020   HGBA1C 5.6 02/12/2020    DG UGI W DOUBLE CM (HD BA)  Result Date: 01/08/2020 CLINICAL DATA:  Gastroesophageal reflux without esophagitis EXAM: UPPER GI SERIES WITHOUT KUB TECHNIQUE: Routine upper GI series was  performed with thin/high density/water soluble barium. FLUOROSCOPY TIME:  Fluoroscopy Time:  48 seconds Radiation Exposure Index (if provided by the fluoroscopic device): 3.7 mGy Number of Acquired Spot Images: 0 COMPARISON:  None. FINDINGS: Examination of the esophagus demonstrated normal esophageal motility. Normal esophageal morphology without evidence of esophagitis or ulceration. No esophageal stricture, diverticula, or mass lesion. Small hiatal hernia. Moderate gastroesophageal reflux. Examination of the stomach demonstrated normal rugal folds and areae gastricae. The gastric mucosa appeared unremarkable without evidence of ulceration, scarring, or mass lesion. Gastric motility and emptying was normal. Fluoroscopic examination of the duodenum demonstrates normal motility and morphology without evidence of ulceration or mass lesion. At the end of the examination a 13 mm barium tablet was administered which transited through the esophagus and esophagogastric junction without delay. IMPRESSION: Small hiatal hernia.  Moderate gastroesophageal reflux. Electronically Signed   By: Kathreen Devoid   On: 01/08/2020 14:25       Assessment & Plan:   Problem List Items Addressed This Visit    Dry mouth    Persistent dry mouth.  Has seen dentist.  Treating acid reflux.  Off antihistamine.  Refer to ENT for further evaluation.  Check routine labs and ESR/ANA.       Relevant Orders   Sedimentation rate (Completed)   ANA   Ambulatory referral to ENT   GERD (gastroesophageal reflux disease)    Has seen Dr Allyn Kenner.  Had recommended protonix.  GI recommended increase PPI to bid.  She is currently taking omeprazole 107m q day - due  to concern that PPI may aggravate her dry moth.  Currently just taking omeprazole 37m q day.  Increased to bid.  Just saw GI.  F/u wit ENT as outlined.       Hypercholesterolemia    On lipitor.  Low cholesterol diet and exercise.  Follow lipid panel and liver function tests.        Stress    Increased stress as outlined. Discussed.  Discussed treatment options. Discussed counseling.  She agreed to medication.  Start zoloft 217mq day.  Follow.       Thyroid nodule    Has seen endocrinology.  Stable.  Recommend f/u one year.       Vitamin D deficiency    Follow vitamin d level       Weight loss    Weight down a couple of pounds from previous check.  Low carb diet and exercise.  Follow.        Other Visit Diagnoses    Urinary frequency    -  Primary   Relevant Orders   Urine Culture   Urinalysis, Routine w reflex microscopic (Completed)   Hyperglycemia           ChEinar PheasantMD

## 2020-02-13 ENCOUNTER — Encounter: Payer: Self-pay | Admitting: Internal Medicine

## 2020-02-13 LAB — URINALYSIS, ROUTINE W REFLEX MICROSCOPIC
Bilirubin Urine: NEGATIVE
Hgb urine dipstick: NEGATIVE
Ketones, ur: NEGATIVE
Leukocytes,Ua: NEGATIVE
Nitrite: NEGATIVE
RBC / HPF: NONE SEEN (ref 0–?)
Specific Gravity, Urine: 1.015 (ref 1.000–1.030)
Total Protein, Urine: NEGATIVE
Urine Glucose: NEGATIVE
Urobilinogen, UA: 0.2 (ref 0.0–1.0)
pH: 7.5 (ref 5.0–8.0)

## 2020-02-13 NOTE — Assessment & Plan Note (Signed)
Follow vitamin d level.   

## 2020-02-13 NOTE — Assessment & Plan Note (Signed)
On lipitor.  Low cholesterol diet and exercise.  Follow lipid panel and liver function tests.   

## 2020-02-13 NOTE — Assessment & Plan Note (Signed)
Increased stress as outlined. Discussed.  Discussed treatment options. Discussed counseling.  She agreed to medication.  Start zoloft 25mg  q day.  Follow.

## 2020-02-13 NOTE — Assessment & Plan Note (Signed)
Weight down a couple of pounds from previous check.  Low carb diet and exercise.  Follow.

## 2020-02-13 NOTE — Assessment & Plan Note (Signed)
Has seen Dr Troy Sine.  Had recommended protonix.  GI recommended increase PPI to bid.  She is currently taking omeprazole 20mg  q day - due to concern that PPI may aggravate her dry moth.  Currently just taking omeprazole 20mg  q day.  Increased to bid.  Just saw GI.  F/u wit ENT as outlined.

## 2020-02-13 NOTE — Assessment & Plan Note (Signed)
Persistent dry mouth.  Has seen dentist.  Treating acid reflux.  Off antihistamine.  Refer to ENT for further evaluation.  Check routine labs and ESR/ANA.

## 2020-02-13 NOTE — Assessment & Plan Note (Signed)
Has seen endocrinology.  Stable.  Recommend f/u one year.

## 2020-02-14 LAB — URINE CULTURE
MICRO NUMBER:: 11438499
Result:: NO GROWTH
SPECIMEN QUALITY:: ADEQUATE

## 2020-02-14 LAB — ANTI-NUCLEAR AB-TITER (ANA TITER): ANA Titer 1: 1:40 {titer} — ABNORMAL HIGH

## 2020-02-14 LAB — ANA: Anti Nuclear Antibody (ANA): POSITIVE — AB

## 2020-02-25 ENCOUNTER — Ambulatory Visit
Admission: RE | Admit: 2020-02-25 | Discharge: 2020-02-25 | Disposition: A | Discharge status: Home / Self Care | Payer: Medicare Other | Source: Ambulatory Visit | Attending: Internal Medicine | Admitting: Internal Medicine

## 2020-02-25 ENCOUNTER — Other Ambulatory Visit: Payer: Self-pay

## 2020-02-25 DIAGNOSIS — Z1231 Encounter for screening mammogram for malignant neoplasm of breast: Secondary | ICD-10-CM | POA: Diagnosis not present

## 2020-03-05 ENCOUNTER — Other Ambulatory Visit: Payer: Self-pay | Admitting: Internal Medicine

## 2020-03-10 ENCOUNTER — Telehealth: Payer: Self-pay | Admitting: Internal Medicine

## 2020-03-10 NOTE — Telephone Encounter (Signed)
Can you triage her and see if another provider has an appointment

## 2020-03-10 NOTE — Telephone Encounter (Signed)
Patient thinks she has a bladder infection. She was up a few times during the night. She would like to see Dr. Lorin Picket. At the time of call there are no appointments available. Patient would like this morning because she has to have her husband at the hospital by 2pm today.

## 2020-03-10 NOTE — Telephone Encounter (Signed)
Noted  

## 2020-03-10 NOTE — Telephone Encounter (Signed)
Patients husband stated she went to an UC. She was not available to speak at the moment.

## 2020-03-26 ENCOUNTER — Ambulatory Visit: Payer: Medicare Other | Admitting: Internal Medicine

## 2020-04-23 ENCOUNTER — Ambulatory Visit (INDEPENDENT_AMBULATORY_CARE_PROVIDER_SITE_OTHER): Payer: Medicare Other | Admitting: Internal Medicine

## 2020-04-23 ENCOUNTER — Other Ambulatory Visit: Payer: Self-pay

## 2020-04-23 DIAGNOSIS — E041 Nontoxic single thyroid nodule: Secondary | ICD-10-CM | POA: Diagnosis not present

## 2020-04-23 DIAGNOSIS — E7800 Pure hypercholesterolemia, unspecified: Secondary | ICD-10-CM | POA: Diagnosis not present

## 2020-04-23 DIAGNOSIS — R634 Abnormal weight loss: Secondary | ICD-10-CM | POA: Diagnosis not present

## 2020-04-23 DIAGNOSIS — K219 Gastro-esophageal reflux disease without esophagitis: Secondary | ICD-10-CM | POA: Diagnosis not present

## 2020-04-23 DIAGNOSIS — M79605 Pain in left leg: Secondary | ICD-10-CM

## 2020-04-23 DIAGNOSIS — M79604 Pain in right leg: Secondary | ICD-10-CM

## 2020-04-23 DIAGNOSIS — T148XXA Other injury of unspecified body region, initial encounter: Secondary | ICD-10-CM

## 2020-04-23 NOTE — Progress Notes (Signed)
Patient ID: Carrie Noble, female   DOB: [birth year], 80 y.o.   MRN: [id number]   Subjective:    Patient ID: Carrie Noble, female    DOB: Jun 05, 1940, 80 y.o.   MRN: [id number]  HPI This visit occurred during the SARS-CoV-2 public health emergency.  Safety protocols were in place, including screening questions prior to the visit, additional usage of staff PPE, and extensive cleaning of exam room while observing appropriate contact time as indicated for disinfecting solutions.  Patient here for a scheduled follow up. Increased stress.  Husband recently - "brain bleed".  S/p surgery.  Doing better now.  Brother died - hospice.  Discussed.  She feels she is handling things relatively well.  Stays active.  No chest pain or sob reported.  No abdominal pain.  Bowels moving.  Leg pain.  Discussed compression hose.  Seeing ENT.    Past Medical History:  Diagnosis Date  . Allergy   . Arthritis   . Gastroesophageal reflux   . Hiatal hernia 1989   status post Nissen fundoplication   . Hx of hysterectomy   . Hyperlipidemia   . Tachycardia    Patient stated that this has been occuring frequently.  . Thyroid disease    Goiter   Past Surgical History:  Procedure Laterality Date  . ABDOMINAL HYSTERECTOMY  1980   partial  . BREAST BIOPSY Right    neg  . BREAST EXCISIONAL BIOPSY Left 2014   neg  . COLONOSCOPY  2015  . ESOPHAGOGASTRIC FUNDOPLICATION  1999  . LAPAROSCOPIC NISSEN FUNDOPLICATION  1999  . TONSILLECTOMY     as well as goiter  . UPPER GI ENDOSCOPY  2015   Family History  Problem Relation Age of Onset  . Stroke Mother 37  . Arthritis Mother   . Heart disease Mother   . Hypertension Mother   . Stroke Father 53  . Hypertension Father   . Rheumatic fever Brother        and multiple open heart surgeries  . Diabetes Brother        type 2  . Stroke Brother   . Other Sister        Coronary Atherosclerosis  . Stroke Brother   . Stroke Sister   . Heart disease Sister   . Dementia  Sister   . Cancer Sister        ovarian  . Breast cancer Neg Hx    Social History   Socioeconomic History  . Marital status: Married    Spouse name: Not on file  . Number of children: Not on file  . Years of education: Not on file  . Highest education level: Not on file  Occupational History  . Occupation: Retired    Associate Professor: [employer]  Tobacco Use  . Smoking status: Never Smoker  . Smokeless tobacco: Never Used  Substance and Sexual Activity  . Alcohol use: No    Alcohol/week: 0.0 standard drinks  . Drug use: No  . Sexual activity: Never  Other Topics Concern  . Not on file  Social History Narrative   Occasionally drinks coffee.   Social Determinants of Health   Financial Resource Strain: Not on file  Food Insecurity: Not on file  Transportation Needs: Not on file  Physical Activity: Not on file  Stress: Not on file  Social Connections: Not on file    Outpatient Encounter Medications as of 04/23/2020  Medication Sig  . atorvastatin (LIPITOR) 10 MG tablet One tablet  q Monday, Wednesday and Friday.  . Biotin 1000 MCG tablet Take 1,000 mcg by mouth 3 (three) times daily. Reported on 05/13/2015  . Calcium Carbonate (CALCIUM 600 PO) Take by mouth daily. Reported on 05/13/2015  . Cholecalciferol (VITAMIN D PO) Take by mouth daily. Reported on 05/13/2015  . Epinastine HCl 0.05 % ophthalmic solution Place 1 drop into both eyes 2 (two) times daily as needed.  . famotidine (PEPCID) 20 MG tablet Take 20 mg by mouth at bedtime.  . famotidine (PEPCID) 40 MG tablet Take 40 mg by mouth at bedtime.  . hydrocortisone 2.5 % cream   . neomycin-polymyxin-dexamethasone (MAXITROL) 0.1 % ophthalmic suspension Place 1 drop into both eyes 2 (two) times daily.  . Olopatadine HCl (PAZEO) 0.7 % SOLN Place 1 drop into both eyes daily.  Marland Kitchen omeprazole (PRILOSEC) 20 MG capsule Take 20 mg by mouth daily.  . pantoprazole (PROTONIX) 40 MG tablet Take 40 mg by mouth every morning.  Bertram Gala  Glycol-Propyl Glycol 0.4-0.3 % SOLN Apply to eye.  . sertraline (ZOLOFT) 25 MG tablet TAKE 1 TABLET (25 MG TOTAL) BY MOUTH DAILY.  Marland Kitchen triamcinolone (KENALOG) 0.1 % paste SMARTSIG:Sparingly TO TEETH Twice Daily  . Vibegron 75 MG TABS Take 75 mg by mouth daily.  . vitamin B-12 (CYANOCOBALAMIN) 1000 MCG tablet Take 1,000 mcg by mouth daily. Reported on 05/13/2015   Facility-Administered Encounter Medications as of 04/23/2020  Medication  . betamethasone acetate-betamethasone sodium phosphate (CELESTONE) injection 3 mg  . betamethasone acetate-betamethasone sodium phosphate (CELESTONE) injection 3 mg    Review of Systems  Constitutional: Negative for appetite change and unexpected weight change.  HENT: Negative for congestion and sinus pressure.   Respiratory: Negative for cough, chest tightness and shortness of breath.   Cardiovascular: Negative for chest pain, palpitations and leg swelling.  Gastrointestinal: Negative for diarrhea, nausea and vomiting.  Genitourinary: Negative for difficulty urinating and dysuria.  Musculoskeletal: Negative for gait problem and joint swelling.  Skin: Negative for color change and rash.       Open wound - right heel - healing.  No surrounding erythema. No drainage.   Neurological: Negative for dizziness, light-headedness and headaches.  Psychiatric/Behavioral: Negative for agitation and dysphoric mood.       Objective:    Physical Exam Vitals reviewed.  Constitutional:      General: She is not in acute distress.    Appearance: Normal appearance.  HENT:     Head: Normocephalic and atraumatic.     Right Ear: External ear normal.     Left Ear: External ear normal.  Eyes:     General: No scleral icterus.       Right eye: No discharge.        Left eye: No discharge.     Conjunctiva/sclera: Conjunctivae normal.  Neck:     Thyroid: No thyromegaly.  Cardiovascular:     Rate and Rhythm: Normal rate and regular rhythm.  Pulmonary:     Effort: No  respiratory distress.     Breath sounds: Normal breath sounds. No wheezing.  Abdominal:     General: Bowel sounds are normal.     Palpations: Abdomen is soft.     Tenderness: There is no abdominal tenderness.  Musculoskeletal:        General: No swelling or tenderness.     Cervical back: Neck supple. No tenderness.  Lymphadenopathy:     Cervical: No cervical adenopathy.  Skin:    Findings: No erythema or rash.  Comments: Open wound - right heal.  Healing.  No surrounding erythema. Non tender.   Neurological:     Mental Status: She is alert.  Psychiatric:        Mood and Affect: Mood normal.        Behavior: Behavior normal.     BP 122/78   Pulse 80   Temp 98 F (36.7 C) (Oral)   Resp 16   Ht 5\' 3"  (1.6 m)   Wt 132 lb (59.9 kg)   SpO2 99%   BMI 23.38 kg/m  Wt Readings from Last 3 Encounters:  04/23/20 132 lb (59.9 kg)  02/12/20 132 lb 6.4 oz (60.1 kg)  09/25/19 134 lb (60.8 kg)     Lab Results  Component Value Date   WBC 6.0 02/12/2020   HGB 13.5 02/12/2020   HCT 39.3 02/12/2020   PLT 340.0 02/12/2020   GLUCOSE 91 02/12/2020   CHOL 257 (H) 02/12/2020   TRIG 114.0 02/12/2020   HDL 68.90 02/12/2020   LDLDIRECT 141.0 09/01/2016   LDLCALC 166 (H) 02/12/2020   ALT 15 02/12/2020   AST 19 02/12/2020   NA 138 02/12/2020   K 4.0 02/12/2020   CL 102 02/12/2020   CREATININE 0.92 02/12/2020   BUN 15 02/12/2020   CO2 29 02/12/2020   TSH 3.26 02/12/2020   HGBA1C 5.6 02/12/2020    MM 3D SCREEN BREAST BILATERAL  Result Date: 02/25/2020 CLINICAL DATA:  Screening. EXAM: DIGITAL SCREENING BILATERAL MAMMOGRAM WITH TOMO AND CAD COMPARISON:  Previous exam(s). ACR Breast Density Category c: The breast tissue is heterogeneously dense, which may obscure small masses. FINDINGS: There are no findings suspicious for malignancy. The images were evaluated with computer-aided detection. IMPRESSION: No mammographic evidence of malignancy. A result letter of this screening mammogram  will be mailed directly to the patient. RECOMMENDATION: Screening mammogram in one year. (Code:[id number]-B-01Y) BI-RADS CATEGORY  1: Negative. Electronically Signed   By: 04/24/2020 M.D.   On: 02/25/2020 12:48       Assessment & Plan:   Problem List Items Addressed This Visit    GERD (gastroesophageal reflux disease)    Has seen Dr 04/24/2020 previously. Continue protonix.        Hypercholesterolemia    Continue lipitor.  Low cholesterol diet and exercise.  Follow lipid panel.        Leg pain    Bilateral leg pain.  Pulses palpable and equal bilateral - DP pulses.  Discussed compression hose.  Follow.       Open wound    Open wound - right heel.  Healing.  Leave uncovered as much as possible.  Stop topical neosporin.  Follow.        Thyroid nodule    Has seen endocrinology.  Stable.  Recommended f/u in one year.       Weight loss    Weight stable from previous check.  Eating.  Follow.          Troy Sine, MD

## 2020-04-25 ENCOUNTER — Encounter: Payer: Self-pay | Admitting: Internal Medicine

## 2020-04-25 DIAGNOSIS — M79606 Pain in leg, unspecified: Secondary | ICD-10-CM | POA: Insufficient documentation

## 2020-04-25 DIAGNOSIS — T148XXA Other injury of unspecified body region, initial encounter: Secondary | ICD-10-CM | POA: Insufficient documentation

## 2020-04-25 NOTE — Assessment & Plan Note (Signed)
Continue lipitor.  Low cholesterol diet and exercise.  Follow lipid panel.

## 2020-04-25 NOTE — Assessment & Plan Note (Signed)
Weight stable from previous check.  Eating.  Follow.

## 2020-04-25 NOTE — Assessment & Plan Note (Signed)
Bilateral leg pain.  Pulses palpable and equal bilateral - DP pulses.  Discussed compression hose.  Follow.

## 2020-04-25 NOTE — Assessment & Plan Note (Signed)
Has seen Dr Troy Sine previously. Continue protonix.

## 2020-04-25 NOTE — Assessment & Plan Note (Signed)
Has seen endocrinology.  Stable.  Recommended f/u in one year.

## 2020-04-25 NOTE — Assessment & Plan Note (Signed)
Open wound - right heel.  Healing.  Leave uncovered as much as possible.  Stop topical neosporin.  Follow.

## 2020-06-09 ENCOUNTER — Other Ambulatory Visit: Payer: Self-pay

## 2020-06-09 ENCOUNTER — Ambulatory Visit (INDEPENDENT_AMBULATORY_CARE_PROVIDER_SITE_OTHER): Payer: Medicare Other

## 2020-06-09 VITALS — BP 149/80 | HR 76 | Temp 97.1°F | Resp 14 | Ht 63.0 in | Wt 133.2 lb

## 2020-06-09 DIAGNOSIS — Z0000 Encounter for general adult medical examination without abnormal findings: Secondary | ICD-10-CM

## 2020-06-09 NOTE — Progress Notes (Signed)
Subjective:   Carrie Noble is a 80 y.o. female who presents for Medicare Annual (Subsequent) preventive examination.  Review of Systems    No ROS.  Medicare Wellness    Cardiac Risk Factors include: advanced age (>7555men, 4>65 women);hypertension     Objective:    Today's Vitals   06/09/20 0919  BP: (!) 149/80  Pulse: 76  Resp: 14  Temp: (!) 97.1 F (36.2 C)  SpO2: 96%  Weight: 133 lb 3.2 oz (60.4 kg)  Height: 5\' 3"  (1.6 m)   Body mass index is 23.6 kg/m.  Advanced Directives 06/09/2020 01/22/2018 10/31/2016 10/30/2015 10/06/2014  Does Patient Have a Medical Advance Directive? No No No No No  Would patient like information on creating a medical advance directive? No - Patient declined - Yes (MAU/Ambulatory/Procedural Areas - Information given) No - patient declined information -    Current Medications (verified) Outpatient Encounter Medications as of 06/09/2020  Medication Sig  . atorvastatin (LIPITOR) 10 MG tablet One tablet q Monday, Wednesday and Friday.  . Biotin 1000 MCG tablet Take 1,000 mcg by mouth 3 (three) times daily. Reported on 05/13/2015  . Calcium Carbonate (CALCIUM 600 PO) Take by mouth daily. Reported on 05/13/2015  . Cholecalciferol (VITAMIN D PO) Take by mouth daily. Reported on 05/13/2015  . Epinastine HCl 0.05 % ophthalmic solution Place 1 drop into both eyes 2 (two) times daily as needed.  . famotidine (PEPCID) 20 MG tablet Take 20 mg by mouth at bedtime.  . famotidine (PEPCID) 40 MG tablet Take 40 mg by mouth at bedtime.  . hydrocortisone 2.5 % cream   . neomycin-polymyxin-dexamethasone (MAXITROL) 0.1 % ophthalmic suspension Place 1 drop into both eyes 2 (two) times daily.  . Olopatadine HCl (PAZEO) 0.7 % SOLN Place 1 drop into both eyes daily.  Marland Kitchen. omeprazole (PRILOSEC) 20 MG capsule Take 20 mg by mouth daily.  . pantoprazole (PROTONIX) 40 MG tablet Take 40 mg by mouth every morning.  Bertram Gala. Polyethyl Glycol-Propyl Glycol 0.4-0.3 % SOLN Apply to eye.  .  sertraline (ZOLOFT) 25 MG tablet TAKE 1 TABLET (25 MG TOTAL) BY MOUTH DAILY.  Marland Kitchen. triamcinolone (KENALOG) 0.1 % paste SMARTSIG:Sparingly TO TEETH Twice Daily  . Vibegron 75 MG TABS Take 75 mg by mouth daily.  . vitamin B-12 (CYANOCOBALAMIN) 1000 MCG tablet Take 1,000 mcg by mouth daily. Reported on 05/13/2015   Facility-Administered Encounter Medications as of 06/09/2020  Medication  . betamethasone acetate-betamethasone sodium phosphate (CELESTONE) injection 3 mg  . betamethasone acetate-betamethasone sodium phosphate (CELESTONE) injection 3 mg    Allergies (verified) Darvocet [propoxyphene n-acetaminophen] and Morphine and related   History: Past Medical History:  Diagnosis Date  . Allergy   . Arthritis   . Gastroesophageal reflux   . Hiatal hernia 1989   status post Nissen fundoplication   . Hx of hysterectomy   . Hyperlipidemia   . Tachycardia    Patient stated that this has been occuring frequently.  . Thyroid disease    Goiter   Past Surgical History:  Procedure Laterality Date  . ABDOMINAL HYSTERECTOMY  1980   partial  . BREAST BIOPSY Right    neg  . BREAST EXCISIONAL BIOPSY Left 2014   neg  . COLONOSCOPY  2015  . ESOPHAGOGASTRIC FUNDOPLICATION  1999  . LAPAROSCOPIC NISSEN FUNDOPLICATION  1999  . TONSILLECTOMY     as well as goiter  . UPPER GI ENDOSCOPY  2015   Family History  Problem Relation Age of Onset  .  Stroke Mother 78  . Arthritis Mother   . Heart disease Mother   . Hypertension Mother   . Stroke Father 67  . Hypertension Father   . Rheumatic fever Brother        and multiple open heart surgeries  . Diabetes Brother        type 2  . Stroke Brother   . Other Sister        Coronary Atherosclerosis  . Stroke Brother   . Stroke Sister   . Heart disease Sister   . Dementia Sister   . Cancer Sister        ovarian  . Breast cancer Neg Hx    Social History   Socioeconomic History  . Marital status: Married    Spouse name: Not on file  .  Number of children: Not on file  . Years of education: Not on file  . Highest education level: Not on file  Occupational History  . Occupation: Retired    Associate Professor: OTHER  Tobacco Use  . Smoking status: Never Smoker  . Smokeless tobacco: Never Used  Substance and Sexual Activity  . Alcohol use: No    Alcohol/week: 0.0 standard drinks  . Drug use: No  . Sexual activity: Never  Other Topics Concern  . Not on file  Social History Narrative   Occasionally drinks coffee.   Social Determinants of Health   Financial Resource Strain: Low Risk   . Difficulty of Paying Living Expenses: Not hard at all  Food Insecurity: No Food Insecurity  . Worried About Programme researcher, broadcasting/film/video in the Last Year: Never true  . Ran Out of Food in the Last Year: Never true  Transportation Needs: No Transportation Needs  . Lack of Transportation (Medical): No  . Lack of Transportation (Non-Medical): No  Physical Activity: Insufficiently Active  . Days of Exercise per Week: 7 days  . Minutes of Exercise per Session: 20 min  Stress: No Stress Concern Present  . Feeling of Stress : Not at all  Social Connections: Unknown  . Frequency of Communication with Friends and Family: Not on file  . Frequency of Social Gatherings with Friends and Family: Not on file  . Attends Religious Services: Not on file  . Active Member of Clubs or Organizations: Not on file  . Attends Banker Meetings: Not on file  . Marital Status: Married    Tobacco Counseling Counseling given: Not Answered   Clinical Intake:  Pre-visit preparation completed: Yes        Diabetes: No  How often do you need to have someone help you when you read instructions, pamphlets, or other written materials from your doctor or pharmacy?: 1 - Never   Interpreter Needed?: No      Activities of Daily Living In your present state of health, do you have any difficulty performing the following activities: 06/09/2020  Hearing? N   Vision? N  Difficulty concentrating or making decisions? N  Walking or climbing stairs? N  Dressing or bathing? N  Doing errands, shopping? N  Preparing Food and eating ? N  Using the Toilet? N  In the past six months, have you accidently leaked urine? Y  Comment Followed by pcp and Urology. Managed with daily pad.  Do you have problems with loss of bowel control? N  Managing your Medications? N  Managing your Finances? N  Housekeeping or managing your Housekeeping? N  Some recent data might be hidden  Patient Care Team: Dale Live Oak, MD as PCP - General (Internal Medicine) Lemar Livings Merrily Pew, MD (General Surgery)  Indicate any recent Medical Services you may have received from other than [facility] providers in the past year (date may be approximate).     Assessment:   This is a routine wellness examination for Hazard.  Hearing/Vision screen  Hearing Screening   125Hz  250Hz  500Hz  1000Hz  2000Hz  3000Hz  4000Hz  6000Hz  8000Hz   Right ear:           Left ear:           Comments: Patient is able to hear conversational tones without difficulty.  No issues reported.  Vision Screening Comments: Followed by Dr.  Visual acuity not assessed per patient preference since they have regular follow up with the ophthalmologist  Dietary issues and exercise activities discussed: Current Exercise Habits: Home exercise routine, Type of exercise: walking, Intensity: Mild  Regular diet Good water intake  Goals Addressed            This Visit's Progress   . COMPLETED: Increase water intake      . Maintain Healthy Lifestyle       Healthy diet Stay active      Depression Screen PHQ 2/9 Scores 06/09/2020 09/25/2019 06/26/2018 10/31/2016 10/17/2016 10/30/2015 10/09/2015  PHQ - 2 Score 0 0 0 0 0 0 0  PHQ- 9 Score - - - 0 - - -    Fall Risk Fall Risk  06/09/2020 09/25/2019 01/29/2019 06/26/2018 10/31/2016  Falls in the past year? 0 0 0 0 No  Number falls in past yr: 0 0 - - -  Injury with Fall?  0 0 - - -  Comment - - - - -  Follow up Falls evaluation completed Falls evaluation completed Falls evaluation completed - -    FALL RISK PREVENTION PERTAINING TO THE HOME: Handrails in use when climbing stairs?Yes Home free of loose throw rugs in walkways, pet beds, electrical cords, etc? Yes  Adequate lighting in your home to reduce risk of falls? Yes   ASSISTIVE DEVICES UTILIZED TO PREVENT FALLS: Life alert? No  Use of a cane, walker or w/c? No   TIMED UP AND GO: Was the test performed? Yes .  Length of time to ambulate 10 feet: 10 sec.   Gait steady and fast without use of assistive device  Cognitive Function: Patient is alert and oriented x3. Denies difficulty focusing, making decisions, memory loss.  Enjoys playing cards and other brain health activities.   MMSE - Mini Mental State Exam 10/31/2016 10/30/2015  Orientation to time 5 5  Orientation to Place 5 5  Registration 3 3  Attention/ Calculation 3 5  Attention/Calculation-comments Difficulty subtracting simple calculations -  Recall 3 3  Language- name 2 objects 2 2  Language- repeat 1 1  Language- follow 3 step command 3 3  Language- read & follow direction 1 1  Write a sentence 1 1  Copy design 1 1  Total score 28 30        Immunizations Immunization History  Administered Date(s) Administered  . Fluad Quad(high Dose 65+) 11/06/2018  . Influenza, High Dose Seasonal PF 12/26/2017  . Influenza,inj,Quad PF,6+ Mos 11/30/2012, 09/19/2013  . Influenza-Unspecified 10/05/2011, 09/25/2019  . Td 09/19/2013    Health Maintenance Health Maintenance  Topic Date Due  . PNA vac Low Risk Adult (1 of 2 - PCV13) Never done  . COVID-19 Vaccine (1) 07/28/2020 (Originally 05/11/1945)  . INFLUENZA VACCINE  08/24/2020  . MAMMOGRAM  02/24/2021  . TETANUS/TDAP  09/20/2023  . DEXA SCAN  Completed  . HPV VACCINES  Aged Out   Colorectal cancer screening: No longer required.   Mammogram status: Completed 02/25/20. Repeat  every year   Covid vaccine x2- completed. Agrees to update immunization.  Lung Cancer Screening: (Low Dose CT Chest recommended if Age 61-80 years, 30 pack-year currently smoking OR have quit w/in 15years.) does not qualify.   Hepatitis C Screening: does not qualify.  Vision Screening: Recommended annual ophthalmology exams for early detection of glaucoma and other disorders of the eye. Is the patient up to date with their annual eye exam?  Yes  Who is the provider or what is the name of the office in which the patient attends annual eye exams? [facility], Dr. Jean Rosenthal.  Dental Screening: Recommended annual dental exams for proper oral hygiene. Visits every 6 months.   Community Resource Referral / Chronic Care Management: CRR required this visit?  No   CCM required this visit?  No      Plan:   Keep all routine maintenance appointments.   Next scheduled lab 07/24/20 @ 9:00  Cpe 07/28/20 @ 11:30  I have personally reviewed and noted the following in the patient's chart:   . Medical and social history . Use of alcohol, tobacco or illicit drugs  . Current medications and supplements including opioid prescriptions. Patient is not taking opioids.  . Functional ability and status . Nutritional status . Physical activity . Advanced directives . List of other physicians . Hospitalizations, surgeries, and ER visits in previous 12 months . Vitals . Screenings to include cognitive, depression, and falls . Referrals and appointments  In addition, I have reviewed and discussed with patient certain preventive protocols, quality metrics, and best practice recommendations. A written personalized care plan for preventive services as well as general preventive health recommendations were provided to patient.     Ashok Pall, LPN   01/27/1279

## 2020-06-09 NOTE — Patient Instructions (Addendum)
[name] , Thank you for taking time to come for your Medicare Wellness Visit. I appreciate your ongoing commitment to your health goals. Please review the following plan we discussed and let me know if I can assist you in the future.   These are the goals we discussed: Goals    . Maintain Healthy Lifestyle     Healthy diet Stay active       This is a list of the screening recommended for you and due dates:  Health Maintenance  Topic Date Due  . Pneumonia vaccines (1 of 2 - PCV13) Never done  . COVID-19 Vaccine (1) 07/28/2020*  . Flu Shot  08/24/2020  . Mammogram  02/24/2021  . Tetanus Vaccine  09/20/2023  . DEXA scan (bone density measurement)  Completed  . HPV Vaccine  Aged Out  *Topic was postponed. The date shown is not the original due date.   Immunizations Immunization History  Administered Date(s) Administered  . Fluad Quad(high Dose 65+) 11/06/2018  . Influenza, High Dose Seasonal PF 12/26/2017  . Influenza,inj,Quad PF,6+ Mos 11/30/2012, 09/19/2013  . Influenza-Unspecified 10/05/2011, 09/25/2019  . Td 09/19/2013   Advanced directives: not yet completed.  Follow up in one year for your annual wellness visit.   Preventive Care 25 Years and Older, Female Preventive care refers to lifestyle choices and visits with your health care provider that can promote health and wellness. What does preventive care include?  A yearly physical exam. This is also called an annual well check.  Dental exams once or twice a year.  Routine eye exams. Ask your health care provider how often you should have your eyes checked.  Personal lifestyle choices, including:  Daily care of your teeth and gums.  Regular physical activity.  Eating a healthy diet.  Avoiding tobacco and drug use.  Limiting alcohol use.  Practicing safe sex.  Taking low-dose aspirin every day.  Taking vitamin and mineral supplements as recommended by your health care provider. What happens during  an annual well check? The services and screenings done by your health care provider during your annual well check will depend on your age, overall health, lifestyle risk factors, and family history of disease. Counseling  Your health care provider may ask you questions about your:  Alcohol use.  Tobacco use.  Drug use.  Emotional well-being.  Home and relationship well-being.  Sexual activity.  Eating habits.  History of falls.  Memory and ability to understand (cognition).  Work and work Astronomer.  Reproductive health. Screening  You may have the following tests or measurements:  Height, weight, and BMI.  Blood pressure.  Lipid and cholesterol levels. These may be checked every 5 years, or more frequently if you are over 70 years old.  Skin check.  Lung cancer screening. You may have this screening every year starting at age 33 if you have a 30-pack-year history of smoking and currently smoke or have quit within the past 15 years.  Fecal occult blood test (FOBT) of the stool. You may have this test every year starting at age 36.  Flexible sigmoidoscopy or colonoscopy. You may have a sigmoidoscopy every 5 years or a colonoscopy every 10 years starting at age 71.  Hepatitis C blood test.  Hepatitis B blood test.  Sexually transmitted disease (STD) testing.  Diabetes screening. This is done by checking your blood sugar (glucose) after you have not eaten for a while (fasting). You may have this done every 1-3 years.  Bone density  scan. This is done to screen for osteoporosis. You may have this done starting at age 25.  Mammogram. This may be done every 1-2 years. Talk to your health care provider about how often you should have regular mammograms. Talk with your health care provider about your test results, treatment options, and if necessary, the need for more tests. Vaccines  Your health care provider may recommend certain vaccines, such as:  Influenza  vaccine. This is recommended every year.  Tetanus, diphtheria, and acellular pertussis (Tdap, Td) vaccine. You may need a Td booster every 10 years.  Zoster vaccine. You may need this after age 78.  Pneumococcal 13-valent conjugate (PCV13) vaccine. One dose is recommended after age 54.  Pneumococcal polysaccharide (PPSV23) vaccine. One dose is recommended after age 81. Talk to your health care provider about which screenings and vaccines you need and how often you need them. This information is not intended to replace advice given to you by your health care provider. Make sure you discuss any questions you have with your health care provider. Document Released: 02/06/2015 Document Revised: 09/30/2015 Document Reviewed: 11/11/2014 Elsevier Interactive Patient Education  2017 [employer].  Fall Prevention in the Home Falls can cause injuries. They can happen to people of all ages. There are many things you can do to make your home safe and to help prevent falls. What can I do on the outside of my home?  Regularly fix the edges of walkways and driveways and fix any cracks.  Remove anything that might make you trip as you walk through a door, such as a raised step or threshold.  Trim any bushes or trees on the path to your home.  Use bright outdoor lighting.  Clear any walking paths of anything that might make someone trip, such as rocks or tools.  Regularly check to see if handrails are loose or broken. Make sure that both sides of any steps have handrails.  Any raised decks and porches should have guardrails on the edges.  Have any leaves, snow, or ice cleared regularly.  Use sand or salt on walking paths during winter.  Clean up any spills in your garage right away. This includes oil or grease spills. What can I do in the bathroom?  Use night lights.  Install grab bars by the toilet and in the tub and shower. Do not use towel bars as grab bars.  Use non-skid mats or decals  in the tub or shower.  If you need to sit down in the shower, use a plastic, non-slip stool.  Keep the floor dry. Clean up any water that spills on the floor as soon as it happens.  Remove soap buildup in the tub or shower regularly.  Attach bath mats securely with double-sided non-slip rug tape.  Do not have throw rugs and other things on the floor that can make you trip. What can I do in the bedroom?  Use night lights.  Make sure that you have a light by your bed that is easy to reach.  Do not use any sheets or blankets that are too big for your bed. They should not hang down onto the floor.  Have a firm chair that has side arms. You can use this for support while you get dressed.  Do not have throw rugs and other things on the floor that can make you trip. What can I do in the kitchen?  Clean up any spills right away.  Avoid walking on wet  floors.  Keep items that you use a lot in easy-to-reach places.  If you need to reach something above you, use a strong step stool that has a grab bar.  Keep electrical cords out of the way.  Do not use floor polish or wax that makes floors slippery. If you must use wax, use non-skid floor wax.  Do not have throw rugs and other things on the floor that can make you trip. What can I do with my stairs?  Do not leave any items on the stairs.  Make sure that there are handrails on both sides of the stairs and use them. Fix handrails that are broken or loose. Make sure that handrails are as long as the stairways.  Check any carpeting to make sure that it is firmly attached to the stairs. Fix any carpet that is loose or worn.  Avoid having throw rugs at the top or bottom of the stairs. If you do have throw rugs, attach them to the floor with carpet tape.  Make sure that you have a light switch at the top of the stairs and the bottom of the stairs. If you do not have them, ask someone to add them for you. What else can I do to help  prevent falls?  Wear shoes that:  Do not have high heels.  Have rubber bottoms.  Are comfortable and fit you well.  Are closed at the toe. Do not wear sandals.  If you use a stepladder:  Make sure that it is fully opened. Do not climb a closed stepladder.  Make sure that both sides of the stepladder are locked into place.  Ask someone to hold it for you, if possible.  Clearly mark and make sure that you can see:  Any grab bars or handrails.  First and last steps.  Where the edge of each step is.  Use tools that help you move around (mobility aids) if they are needed. These include:  Canes.  Walkers.  Scooters.  Crutches.  Turn on the lights when you go into a dark area. Replace any light bulbs as soon as they burn out.  Set up your furniture so you have a clear path. Avoid moving your furniture around.  If any of your floors are uneven, fix them.  If there are any pets around you, be aware of where they are.  Review your medicines with your doctor. Some medicines can make you feel dizzy. This can increase your chance of falling. Ask your doctor what other things that you can do to help prevent falls. This information is not intended to replace advice given to you by your health care provider. Make sure you discuss any questions you have with your health care provider. Document Released: 11/06/2008 Document Revised: 06/18/2015 Document Reviewed: 02/14/2014 Elsevier Interactive Patient Education  2017 [employer].

## 2020-07-22 ENCOUNTER — Telehealth: Payer: Self-pay | Admitting: *Deleted

## 2020-07-22 DIAGNOSIS — I10 Essential (primary) hypertension: Secondary | ICD-10-CM

## 2020-07-22 DIAGNOSIS — E7800 Pure hypercholesterolemia, unspecified: Secondary | ICD-10-CM

## 2020-07-22 DIAGNOSIS — R739 Hyperglycemia, unspecified: Secondary | ICD-10-CM

## 2020-07-22 NOTE — Telephone Encounter (Signed)
Orders placed for labs

## 2020-07-22 NOTE — Telephone Encounter (Signed)
Please place future orders for lab appt.  

## 2020-07-24 ENCOUNTER — Other Ambulatory Visit: Payer: Self-pay

## 2020-07-24 ENCOUNTER — Other Ambulatory Visit (INDEPENDENT_AMBULATORY_CARE_PROVIDER_SITE_OTHER): Payer: Medicare Other

## 2020-07-24 DIAGNOSIS — R739 Hyperglycemia, unspecified: Secondary | ICD-10-CM

## 2020-07-24 DIAGNOSIS — I10 Essential (primary) hypertension: Secondary | ICD-10-CM | POA: Diagnosis not present

## 2020-07-24 DIAGNOSIS — E7800 Pure hypercholesterolemia, unspecified: Secondary | ICD-10-CM | POA: Diagnosis not present

## 2020-07-24 LAB — HEPATIC FUNCTION PANEL
ALT: 12 U/L (ref 0–35)
AST: 17 U/L (ref 0–37)
Albumin: 4.3 g/dL (ref 3.5–5.2)
Alkaline Phosphatase: 108 U/L (ref 39–117)
Bilirubin, Direct: 0.1 mg/dL (ref 0.0–0.3)
Total Bilirubin: 0.7 mg/dL (ref 0.2–1.2)
Total Protein: 6.7 g/dL (ref 6.0–8.3)

## 2020-07-24 LAB — LIPID PANEL
Cholesterol: 250 mg/dL — ABNORMAL HIGH (ref 0–200)
HDL: 63 mg/dL (ref >39.00)
LDL Cholesterol: 161 mg/dL — ABNORMAL HIGH (ref 0–99)
NonHDL: 186.64
Total CHOL/HDL Ratio: 4
Triglycerides: 127 mg/dL (ref 0.0–149.0)
VLDL: 25.4 mg/dL (ref 0.0–40.0)

## 2020-07-24 LAB — BASIC METABOLIC PANEL
BUN: 17 mg/dL (ref 6–23)
CO2: 27 mEq/L (ref 19–32)
Calcium: 9.4 mg/dL (ref 8.4–10.5)
Chloride: 106 mEq/L (ref 96–112)
Creatinine, Ser: 1 mg/dL (ref 0.40–1.20)
GFR: 52.95 mL/min — ABNORMAL LOW (ref >60.00)
Glucose, Bld: 90 mg/dL (ref 70–99)
Potassium: 4.3 mEq/L (ref 3.5–5.1)
Sodium: 141 mEq/L (ref 135–145)

## 2020-07-24 LAB — HEMOGLOBIN A1C: Hgb A1c MFr Bld: 5.6 % (ref 4.6–6.5)

## 2020-07-28 ENCOUNTER — Encounter: Payer: Self-pay | Admitting: Internal Medicine

## 2020-07-28 ENCOUNTER — Other Ambulatory Visit: Payer: Self-pay

## 2020-07-28 ENCOUNTER — Ambulatory Visit (INDEPENDENT_AMBULATORY_CARE_PROVIDER_SITE_OTHER): Payer: Medicare Other | Admitting: Internal Medicine

## 2020-07-28 VITALS — BP 122/76 | HR 68 | Temp 97.9°F | Resp 16 | Ht 63.0 in | Wt 131.0 lb

## 2020-07-28 DIAGNOSIS — R739 Hyperglycemia, unspecified: Secondary | ICD-10-CM

## 2020-07-28 DIAGNOSIS — K219 Gastro-esophageal reflux disease without esophagitis: Secondary | ICD-10-CM | POA: Diagnosis not present

## 2020-07-28 DIAGNOSIS — I10 Essential (primary) hypertension: Secondary | ICD-10-CM

## 2020-07-28 DIAGNOSIS — I700 Atherosclerosis of aorta: Secondary | ICD-10-CM

## 2020-07-28 DIAGNOSIS — E041 Nontoxic single thyroid nodule: Secondary | ICD-10-CM

## 2020-07-28 DIAGNOSIS — N1831 Chronic kidney disease, stage 3a: Secondary | ICD-10-CM

## 2020-07-28 DIAGNOSIS — E7800 Pure hypercholesterolemia, unspecified: Secondary | ICD-10-CM | POA: Diagnosis not present

## 2020-07-28 DIAGNOSIS — R634 Abnormal weight loss: Secondary | ICD-10-CM

## 2020-07-28 DIAGNOSIS — F439 Reaction to severe stress, unspecified: Secondary | ICD-10-CM

## 2020-07-28 NOTE — Progress Notes (Signed)
Patient ID: Carrie Noble, female   DOB: [birth year], 80 y.o.   MRN: [id number]   Subjective:    Patient ID: Carrie Noble, female    DOB: [birth year], 80 y.o.   MRN: [id number]  HPI This visit occurred during the SARS-CoV-2 public health emergency.  Safety protocols were in place, including screening questions prior to the visit, additional usage of staff PPE, and extensive cleaning of exam room while observing appropriate contact time as indicated for disinfecting solutions.   Patient here for a scheduled follow up.  Increased stress with her husband's health issues.  Discussed.  Desires no further intervention.  Tries to stay active.  No chest pain or sob reported.  Does report some aching - RUQ - under right breast.  Some burning sensation.  Discussed further w/up.  She declines.  Wants to monitor.  No other abdominal pain.  Bowels moving.  Blood pressure ok.  Saw Dr Honor Junes 05/2020.  Thyroid ultrasound - "no real difference".  Recommended f/u in one year with thyroid ultrasound.   .  Past Medical History:  Diagnosis Date   Allergy    Arthritis    Gastroesophageal reflux    Hiatal hernia 1989   status post Nissen fundoplication    Hx of hysterectomy    Hyperlipidemia    Tachycardia    Patient stated that this has been occuring frequently.   Thyroid disease    Goiter   Past Surgical History:  Procedure Laterality Date   ABDOMINAL HYSTERECTOMY  1980   partial   BREAST BIOPSY Right    neg   BREAST EXCISIONAL BIOPSY Left 2014   neg   COLONOSCOPY  2015   ESOPHAGOGASTRIC FUNDOPLICATION  9169   LAPAROSCOPIC NISSEN FUNDOPLICATION  4503   TONSILLECTOMY     as well as goiter   UPPER GI ENDOSCOPY  2015   Family History  Problem Relation Age of Onset   Stroke Mother 21   Arthritis Mother    Heart disease Mother    Hypertension Mother    Stroke Father 72   Hypertension Father    Rheumatic fever Brother        and multiple open heart surgeries   Diabetes Brother        type 2    Stroke Brother    Other Sister        Coronary Atherosclerosis   Stroke Brother    Stroke Sister    Heart disease Sister    Dementia Sister    Cancer Sister        ovarian   Breast cancer Neg Hx    Social History   Socioeconomic History   Marital status: Married    Spouse name: Not on file   Number of children: Not on file   Years of education: Not on file   Highest education level: Not on file  Occupational History   Occupation: Retired    Fish farm manager: OTHER  Tobacco Use   Smoking status: Never   Smokeless tobacco: Never  Substance and Sexual Activity   Alcohol use: No    Alcohol/week: 0.0 standard drinks   Drug use: No   Sexual activity: Never  Other Topics Concern   Not on file  Social History Narrative   Occasionally drinks coffee.   Social Determinants of Health   Financial Resource Strain: Low Risk    Difficulty of Paying Living Expenses: Not hard at all  Food Insecurity: No Food Insecurity   Worried About Running  Out of Food in the Last Year: Never true   Ran Out of Food in the Last Year: Never true  Transportation Needs: No Transportation Needs   Lack of Transportation (Medical): No   Lack of Transportation (Non-Medical): No  Physical Activity: Insufficiently Active   Days of Exercise per Week: 7 days   Minutes of Exercise per Session: 20 min  Stress: No Stress Concern Present   Feeling of Stress : Not at all  Social Connections: Unknown   Frequency of Communication with Friends and Family: Not on file   Frequency of Social Gatherings with Friends and Family: Not on file   Attends Religious Services: Not on file   Active Member of Clubs or Organizations: Not on file   Attends Archivist Meetings: Not on file   Marital Status: Married    Review of Systems  Constitutional:  Negative for appetite change and unexpected weight change.  HENT:  Negative for congestion and sinus pressure.   Respiratory:  Negative for cough, chest tightness and  shortness of breath.   Cardiovascular:  Negative for chest pain, palpitations and leg swelling.  Gastrointestinal:  Negative for diarrhea, nausea and vomiting.       RUQ aching as outlined. No other abdominal pain.   Genitourinary:  Negative for difficulty urinating and dysuria.  Musculoskeletal:  Negative for joint swelling and myalgias.  Skin:  Negative for color change and rash.  Neurological:  Negative for dizziness, light-headedness and headaches.  Psychiatric/Behavioral:  Negative for agitation and dysphoric mood.        Increased stress as outlined.       Objective:    Physical Exam Vitals reviewed.  Constitutional:      General: She is not in acute distress.    Appearance: Normal appearance.  HENT:     Head: Normocephalic and atraumatic.     Right Ear: External ear normal.     Left Ear: External ear normal.  Eyes:     General: No scleral icterus.       Right eye: No discharge.        Left eye: No discharge.     Conjunctiva/sclera: Conjunctivae normal.  Neck:     Thyroid: No thyromegaly.  Cardiovascular:     Rate and Rhythm: Normal rate and regular rhythm.  Pulmonary:     Effort: No respiratory distress.     Breath sounds: Normal breath sounds. No wheezing.  Abdominal:     General: Bowel sounds are normal.     Palpations: Abdomen is soft.     Tenderness: There is no abdominal tenderness.  Musculoskeletal:        General: No swelling or tenderness.     Cervical back: Neck supple. No tenderness.  Lymphadenopathy:     Cervical: No cervical adenopathy.  Skin:    Findings: No erythema or rash.  Neurological:     Mental Status: She is alert.  Psychiatric:        Mood and Affect: Mood normal.        Behavior: Behavior normal.    BP 122/76   Pulse 68   Temp 97.9 F (36.6 C)   Resp 16   Ht 5' 3"  (1.6 m)   Wt 131 lb (59.4 kg)   SpO2 98%   BMI 23.21 kg/m  Wt Readings from Last 3 Encounters:  07/28/20 131 lb (59.4 kg)  06/09/20 133 lb 3.2 oz (60.4 kg)   04/23/20 132 lb (59.9 kg)  Outpatient Encounter Medications as of 07/28/2020  Medication Sig   atorvastatin (LIPITOR) 10 MG tablet One tablet q Monday, Wednesday and Friday.   Biotin 1000 MCG tablet Take 1,000 mcg by mouth 3 (three) times daily. Reported on 05/13/2015   Calcium Carbonate (CALCIUM 600 PO) Take by mouth daily. Reported on 05/13/2015   Cholecalciferol (VITAMIN D PO) Take by mouth daily. Reported on 05/13/2015   Epinastine HCl 0.05 % ophthalmic solution Place 1 drop into both eyes 2 (two) times daily as needed.   famotidine (PEPCID) 20 MG tablet Take 20 mg by mouth at bedtime.   hydrocortisone 2.5 % cream    neomycin-polymyxin-dexamethasone (MAXITROL) 0.1 % ophthalmic suspension Place 1 drop into both eyes 2 (two) times daily.   Olopatadine HCl (PAZEO) 0.7 % SOLN Place 1 drop into both eyes daily.   omeprazole (PRILOSEC) 20 MG capsule Take 20 mg by mouth daily.   pantoprazole (PROTONIX) 40 MG tablet Take 40 mg by mouth every morning.   Polyethyl Glycol-Propyl Glycol 0.4-0.3 % SOLN Apply to eye.   triamcinolone (KENALOG) 0.1 % paste SMARTSIG:Sparingly TO TEETH Twice Daily   Vibegron 75 MG TABS Take 75 mg by mouth daily.   vitamin B-12 (CYANOCOBALAMIN) 1000 MCG tablet Take 1,000 mcg by mouth daily. Reported on 05/13/2015   [DISCONTINUED] famotidine (PEPCID) 40 MG tablet Take 40 mg by mouth at bedtime. (Patient not taking: Reported on 07/28/2020)   [DISCONTINUED] sertraline (ZOLOFT) 25 MG tablet TAKE 1 TABLET (25 MG TOTAL) BY MOUTH DAILY. (Patient not taking: Reported on 07/28/2020)   Facility-Administered Encounter Medications as of 07/28/2020  Medication   betamethasone acetate-betamethasone sodium phosphate (CELESTONE) injection 3 mg   betamethasone acetate-betamethasone sodium phosphate (CELESTONE) injection 3 mg     Lab Results  Component Value Date   WBC 6.0 02/12/2020   HGB 13.5 02/12/2020   HCT 39.3 02/12/2020   PLT 340.0 02/12/2020   GLUCOSE 90 07/24/2020   CHOL 250 (H)  07/24/2020   TRIG 127.0 07/24/2020   HDL 63.00 07/24/2020   LDLDIRECT 141.0 09/01/2016   LDLCALC 161 (H) 07/24/2020   ALT 12 07/24/2020   AST 17 07/24/2020   NA 141 07/24/2020   K 4.3 07/24/2020   CL 106 07/24/2020   CREATININE 1.00 07/24/2020   BUN 17 07/24/2020   CO2 27 07/24/2020   TSH 3.26 02/12/2020   HGBA1C 5.6 07/24/2020    MM 3D SCREEN BREAST BILATERAL  Result Date: 02/25/2020 CLINICAL DATA:  Screening. EXAM: DIGITAL SCREENING BILATERAL MAMMOGRAM WITH TOMO AND CAD COMPARISON:  Previous exam(s). ACR Breast Density Category c: The breast tissue is heterogeneously dense, which may obscure small masses. FINDINGS: There are no findings suspicious for malignancy. The images were evaluated with computer-aided detection. IMPRESSION: No mammographic evidence of malignancy. A result letter of this screening mammogram will be mailed directly to the patient. RECOMMENDATION: Screening mammogram in one year. (Code:SM-B-01Y) BI-RADS CATEGORY  1: Negative. Electronically Signed   By: Margarette Canada M.D.   On: 02/25/2020 12:48       Assessment & Plan:   Problem List Items Addressed This Visit     Aortic atherosclerosis ([place])    Continue lipitor.        Benign essential HTN    Blood pressure as outlined on no medications.  Follow pressures.         Relevant Orders   Basic metabolic panel   CKD (chronic kidney disease) stage 3, GFR 30-59 ml/min (HCC)    Continue to avoid antiinflammatories.  Stay hydrated.  Recent GFR 53.  Follow        GERD (gastroesophageal reflux disease)    Has seen Dr Allyn Kenner previously.  On protonix.         Hypercholesterolemia - Primary    Continue lipitor.  Low cholesterol diet and exercise.  Follow lipid panel and liver function tests.         Relevant Orders   Hepatic function panel   Lipid panel   Hyperglycemia    Low carb diet and exercise.  Follow met b and a1c.        Relevant Orders   Hemoglobin A1c   Stress    Increased stress as  outlined.  Overall she feels she is handling things relatively well.  Does not feel she warrants any further intervention at this time.  Off Zoloft.       Thyroid nodule    Had follow-up with Dr. Honor Junes May 2022.  Thyroid ultrasound stable.  Recommended follow-up 1 year with thyroid ultrasound.       Weight loss    Weight stable.  She reports she is eating.  No nausea or vomiting.  Desires no further work-up for the right upper quadrant aching.  Will notify me if she changes her mind or if symptoms worsen.         Einar Pheasant, MD

## 2020-08-01 ENCOUNTER — Encounter: Payer: Self-pay | Admitting: Internal Medicine

## 2020-08-01 DIAGNOSIS — N183 Chronic kidney disease, stage 3 unspecified: Secondary | ICD-10-CM | POA: Insufficient documentation

## 2020-08-01 DIAGNOSIS — I700 Atherosclerosis of aorta: Secondary | ICD-10-CM | POA: Insufficient documentation

## 2020-08-01 NOTE — Assessment & Plan Note (Signed)
Had follow-up with Dr. O'Connell May 2022.  Thyroid ultrasound stable.  Recommended follow-up 1 year with thyroid ultrasound. °

## 2020-08-01 NOTE — Assessment & Plan Note (Signed)
Low carb diet and exercise.  Follow met b and a1c.  

## 2020-08-01 NOTE — Assessment & Plan Note (Signed)
Has seen Dr Troy Sine previously.  On protonix.

## 2020-08-01 NOTE — Assessment & Plan Note (Signed)
Blood pressure as outlined on no medications.  Follow pressures.

## 2020-08-01 NOTE — Assessment & Plan Note (Signed)
Weight stable.  She reports she is eating.  No nausea or vomiting.  Desires no further work-up for the right upper quadrant aching.  Will notify me if she changes her mind or if symptoms worsen.

## 2020-08-01 NOTE — Assessment & Plan Note (Signed)
Continue to avoid antiinflammatories.  Stay hydrated.  Recent GFR 53.  Follow

## 2020-08-01 NOTE — Assessment & Plan Note (Signed)
Continue lipitor  ?

## 2020-08-01 NOTE — Assessment & Plan Note (Signed)
Increased stress as outlined.  Overall she feels she is handling things relatively well.  Does not feel she warrants any further intervention at this time.  Off Zoloft.

## 2020-08-01 NOTE — Assessment & Plan Note (Signed)
Continue lipitor.  Low cholesterol diet and exercise.  Follow lipid panel and liver function tests.   

## 2020-08-13 ENCOUNTER — Telehealth: Payer: Self-pay | Admitting: Internal Medicine

## 2020-08-13 ENCOUNTER — Other Ambulatory Visit: Payer: Self-pay

## 2020-08-13 ENCOUNTER — Ambulatory Visit
Admission: EM | Admit: 2020-08-13 | Discharge: 2020-08-13 | Disposition: A | Discharge status: Home / Self Care | Payer: Medicare Other | Attending: Family Medicine | Admitting: Family Medicine

## 2020-08-13 DIAGNOSIS — K5900 Constipation, unspecified: Secondary | ICD-10-CM | POA: Diagnosis present

## 2020-08-13 DIAGNOSIS — R682 Dry mouth, unspecified: Secondary | ICD-10-CM

## 2020-08-13 DIAGNOSIS — R350 Frequency of micturition: Secondary | ICD-10-CM | POA: Diagnosis present

## 2020-08-13 LAB — POCT URINALYSIS DIP (MANUAL ENTRY)
Bilirubin, UA: NEGATIVE
Glucose, UA: NEGATIVE mg/dL
Ketones, POC UA: NEGATIVE mg/dL
Leukocytes, UA: NEGATIVE
Nitrite, UA: NEGATIVE
Protein Ur, POC: 30 mg/dL — AB
Spec Grav, UA: 1.02 (ref 1.010–1.025)
Urobilinogen, UA: 0.2 E.U./dL
pH, UA: 7 (ref 5.0–8.0)

## 2020-08-13 MED ORDER — POLYETHYLENE GLYCOL 3350 17 G PO PACK: 17.0000 g | PACK | Freq: Every day | ORAL | 0 refills | Status: DC

## 2020-08-13 NOTE — ED Provider Notes (Signed)
[facility]    CSN: [id number] Arrival date & time: 08/13/20  1115      History   Chief Complaint Chief Complaint  Patient presents with   Urinary Frequency    HPI Carrie Noble is a 80 y.o. female.   HPI Patient presents today with recurrent abdominal cramping which is nonspecific in the right upper quadrant.  Patient has a history of hiatal hernia along with moderate to severe gastric acid reflux.  She is currently being managed on famotidine and pantoprazole.  She reports she is not currently taking the famotidine but feels the right upper quadrant cramping is worse and is most precipitated by laying in bed at night.  She also complains of dryness of mouth related to the acid reflux.  She has an appointment with her GI doctor in a couple weeks.  She also has a history of Barrett's esophagus.  She also had concern of urinary frequency and urgency.  She reports this has been ongoing for an extended period of time but she felt over the week it has gotten worse.  She is concerned for possible UTI.  Her last bowel movement was a few days ago and she endorses that she has had some bloating and only produced a small amount of stool which was not incredibly soft.  She reports going on average 2 to 3 days without bowel movements.  She has not taken any medication for constipation.  She is not having any nausea or vomiting. Past Medical History:  Diagnosis Date   Allergy    Arthritis    Gastroesophageal reflux    Hiatal hernia 1989   status post Nissen fundoplication    Hx of hysterectomy    Hyperlipidemia    Tachycardia    Patient stated that this has been occuring frequently.   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   Chest pain 12/03/2017   Vitamin D deficiency 11/30/2017   Leg weakness 11/30/2017   Benign essential HTN 10/24/2016   Stress 09/04/2016   Urge incontinence 09/04/2016   Headache 02/16/2016   Dry eyes 02/16/2016   Acute bronchitis 11/26/2015   Memory change 10/11/2015   Allergic rhinitis 05/06/2015   Breast tenderness in female 09/14/2014   Pain in right hip 07/14/2014   Carotid bruit 07/14/2014   Difficulty sleeping 03/30/2014   Health care maintenance 03/30/2014   Diverticulosis 03/27/2013   Chronic cystitis 12/31/2012   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   Abnormal mammogram 09/06/2012   Osteoporosis 05/29/2012   Thyroid nodule 05/28/2012   Barrett's esophagus 05/28/2012   Hypercholesterolemia 01/03/2011   Palpitations 01/03/2011   GERD (gastroesophageal reflux disease) 01/03/2011    Past Surgical History:  Procedure Laterality Date   ABDOMINAL HYSTERECTOMY  1980   partial   BREAST BIOPSY Right    neg   BREAST EXCISIONAL BIOPSY Left 2014   neg   COLONOSCOPY  2015   ESOPHAGOGASTRIC FUNDOPLICATION  1999   LAPAROSCOPIC NISSEN FUNDOPLICATION  1999   TONSILLECTOMY     as well as goiter   UPPER GI ENDOSCOPY  2015    OB History     Gravida  2   Para  2   Term      Preterm      AB      Living  2      SAB      IAB      Ectopic      Multiple      Live Births           Obstetric Comments  Menstrual age: 14  Age 1st Pregnancy: 41          Home Medications    Prior to Admission medications   Medication Sig Start Date End Date Taking? Authorizing Provider  atorvastatin (LIPITOR) 10 MG tablet One tablet q Monday, Wednesday and Friday. 06/03/19   Dale [place], MD  Biotin 1000 MCG tablet  Take 1,000 mcg by mouth 3 (three) times daily. Reported on 05/13/2015    [provider]  Calcium Carbonate (CALCIUM 600 PO) Take by mouth daily. Reported on 05/13/2015    [provider]  Cholecalciferol (VITAMIN D PO) Take by mouth daily. Reported on 05/13/2015    [provider]  Epinastine HCl 0.05 % ophthalmic solution Place 1 drop into both eyes 2 (two) times daily as needed. 08/15/17   Alfonse Spruce, MD  famotidine (PEPCID) 20 MG tablet Take 20 mg by mouth at bedtime. 09/11/19   [provider]  hydrocortisone 2.5 % cream  01/09/18   [provider]  neomycin-polymyxin-dexamethasone (MAXITROL) 0.1 % ophthalmic suspension Place 1 drop into both eyes 2 (two) times daily.    [provider]  Olopatadine HCl (PAZEO) 0.7 % SOLN Place 1 drop into both eyes daily. 08/07/17   Alfonse Spruce, MD  omeprazole (PRILOSEC) 20 MG capsule Take 20 mg by mouth daily. 02/07/19   [provider]  pantoprazole (PROTONIX) 40 MG tablet Take 40 mg by mouth every morning. 09/11/19   [provider]  Polyethyl Glycol-Propyl Glycol 0.4-0.3 % SOLN Apply to eye.    [provider]  triamcinolone (KENALOG) 0.1 % paste SMARTSIG:Sparingly TO TEETH Twice Daily 07/19/19   [provider]  Vibegron 75 MG TABS Take 75 mg by mouth daily. 06/17/19   Alfredo Martinez, MD  vitamin B-12 (CYANOCOBALAMIN) 1000 MCG tablet Take 1,000 mcg by mouth daily. Reported on 05/13/2015    [provider]    Family History Family History  Problem Relation Age of Onset   Stroke Mother 17   Arthritis Mother    Heart disease Mother    Hypertension Mother    Stroke Father 76   Hypertension Father    Rheumatic fever Brother        and multiple open heart surgeries   Diabetes Brother        type 2   Stroke Brother    Other Sister        Coronary Atherosclerosis   Stroke Brother    Stroke Sister    Heart disease Sister    Dementia  Sister    Cancer Sister        ovarian   Breast cancer Neg Hx     Social History Social History   Tobacco Use   Smoking status: Never   Smokeless tobacco: Never  Substance Use Topics   Alcohol use: No    Alcohol/week: 0.0 standard drinks  Drug use: No     Allergies   Darvocet [propoxyphene n-acetaminophen] and Morphine and related   Review of Systems Review of Systems Pertinent negatives listed in HPI  Physical Exam Triage Vital Signs ED Triage Vitals [08/13/20 1144]  Enc Vitals Group     BP (!) 149/83     Pulse Rate 68     Resp 16     Temp 98.3 F (36.8 C)     Temp Source Oral     SpO2 95 %     Weight 130 lb (59 kg)     Height 5\' 4"  (1.626 m)     Head Circumference      Peak Flow      Pain Score 0     Pain Loc      Pain Edu?      Excl. in GC?    No data found.  Updated Vital Signs BP (!) 149/83   Pulse 68   Temp 98.3 F (36.8 C) (Oral)   Resp 16   Ht 5\' 4"  (1.626 m)   Wt 130 lb (59 kg)   SpO2 95%   BMI 22.31 kg/m   Visual Acuity Right Eye Distance:   Left Eye Distance:   Bilateral Distance:    Right Eye Near:   Left Eye Near:    Bilateral Near:     Physical Exam Constitutional:      Appearance: Normal appearance.  HENT:     Head: Normocephalic and atraumatic.  Cardiovascular:     Rate and Rhythm: Normal rate and regular rhythm.  Pulmonary:     Effort: Pulmonary effort is normal.     Breath sounds: Normal breath sounds.  Abdominal:     General: There is distension (.).     Tenderness: There is no abdominal tenderness. There is no right CVA tenderness, left CVA tenderness, guarding or rebound.     Comments: Abdomen is soft slightly distended consistent with bloating.  Patient negative for any guarding, rebound tenderness or peritoneal signs.  Skin:    General: Skin is warm and dry.     Capillary Refill: Capillary refill takes less than 2 seconds.  Neurological:     General: No focal deficit present.     Mental Status: She is  alert and oriented to person, place, and time.  Psychiatric:        Mood and Affect: Mood normal.        Behavior: Behavior normal.        Thought Content: Thought content normal.     UC Treatments / Results  Labs (all labs ordered are listed, but only abnormal results are displayed) Labs Reviewed  POCT URINALYSIS DIP (MANUAL ENTRY) - Abnormal; Notable for the following components:      Result Value   Blood, UA trace-intact (*)    Protein Ur, POC =30 (*)    All other components within normal limits  URINE CULTURE    EKG   Radiology No results found.  Procedures Procedures (including critical care time)  Medications Ordered in UC Medications - No data to display  Initial Impression / Assessment and Plan / UC Course  I have reviewed the triage vital signs and the nursing notes.  Pertinent labs & imaging results that were available during my care of the patient were reviewed by me and considered in my medical decision making (see chart for details).    Urine culture pending UA findings negative leuks and consistent with a urinary  tract infection.  Recommend hydrating well with fluids.  Urine frequency has been a chronic ongoing symptom for patient as she has a history of heavy caffeine consumption which she reports she has decreased recently.  Therefore encouraged increasing plain water intake.  This will also help with mouth dryness.  Also recommended over-the-counter Biotene to help with mouth dryness.  Continue current acid reflux symptom management.  Patient also has not had a bulky stool prescribed MiraLAX to take once daily as needed until she produces a full bulky stool.  Emergency department if any of her symptoms worsen or do not improve.  Keep follow-up with primary doctor and GI doctor. Final Clinical Impressions(s) / UC Diagnoses   Final diagnoses:  Constipation, unspecified constipation type  Urine frequency  Mouth dryness     Discharge Instructions       Your urine results here in clinic were negative for infection.  I will send urine out for culture to see if any growth occurs indicating presence of bacteria.  In the meantime recommend treating constipation which I think is the source of your symptoms of abdominal cramping.  Also recommend purchasing over-the-counter Biotene to help with mouth dryness.  Continue to hydrate well with fluids.  Continue to limit consumption of caffeine.  Keep follow-up with primary care doctor.    ED Prescriptions     Medication Sig Dispense Auth. Provider   polyethylene glycol (MIRALAX) 17 g packet Take 17 g by mouth daily. 14 each Bing Neighbors, FNP      PDMP not reviewed this encounter.   Bing Neighbors, FNP 08/13/20 [phone]

## 2020-08-13 NOTE — Telephone Encounter (Signed)
Called and spoke with [name]. Carrie Noble states that she has been experiencing some urinary frequency but her main point of calling was indigestion at night. She states that at night when she lays down, she gets indigestion and that is what is causing the pain under her right breast. She declines any chest pain, heart palpitations, or SOB. She states that she has already reached out to her GI Doctor Wandra Mannan. I informed her that Dr. Lorin Picket would like her to be evaluated for the Urinary Symptoms and indigestion. Carrie Noble declined going to the urgent care today as she wants to wait to hear back from [facility] and states that if she still feels bad and she gets no medication from [place], she will go to the walk in clinic tomorrow for her UTI and indigestion.

## 2020-08-13 NOTE — Telephone Encounter (Signed)
Providing access nurse documentation.      

## 2020-08-13 NOTE — ED Triage Notes (Signed)
Pt reports having urinary frequency and urgency"for awhile". Sts it has gotten worse over the last week.    Also c/o RUQ abd discomfort.

## 2020-08-13 NOTE — Discharge Instructions (Signed)
Your urine results here in clinic were negative for infection.  I will send urine out for culture to see if any growth occurs indicating presence of bacteria.  In the meantime recommend treating constipation which I think is the source of your symptoms of abdominal cramping.  Also recommend purchasing over-the-counter Biotene to help with mouth dryness.  Continue to hydrate well with fluids.  Continue to limit consumption of caffeine.  Keep follow-up with primary care doctor.

## 2020-08-13 NOTE — Telephone Encounter (Signed)
Pt in Urgent Care.

## 2020-08-13 NOTE — Telephone Encounter (Signed)
Patient informed, Due to the high volume of calls and your symptoms we have to forward your call to our Triage Nurse to expedient your call. Please hold for the transfer.  Patient transferred to Access Nurse. Due to having a flareup of their bladder/UTI. They also stated they are having pain on their upper right side under the breast. They also can hardly sleep as it seems the ingestion gets worse. Advise we have nothing available virtually or in office.

## 2020-08-13 NOTE — Telephone Encounter (Signed)
Pt called access nurse stating that they have a UTI and pain under their right breast. Pt was instructed to see PCP within 24 hours.

## 2020-08-14 LAB — URINE CULTURE
Culture: <10000 — AB
Special Requests: NORMAL

## 2020-09-07 ENCOUNTER — Telehealth: Payer: Self-pay | Admitting: Internal Medicine

## 2020-09-07 NOTE — Telephone Encounter (Signed)
Patient is calling to see when the last time she had labs done to see if she has diabetes.Her eye doctor who did her cataract surgery stated that her eyes may not be healing well after the surgery because she may have diabetes.She is unsure if she has diabetes and also if she should get another pneumonia shot this year.

## 2020-09-08 NOTE — Telephone Encounter (Signed)
Advised patient that she does not have diabetes. Her last A1C was 5.6 in July 2022. Patient was also wondering about a pneumonia shot. Per chart, she has not had one. Patient is going to discuss with Dr Lorin Picket at appt in October.

## 2020-09-08 NOTE — Telephone Encounter (Signed)
PT called to advise that she is still waiting to find out the last time she was tested for diabetes. She is also waiting to see when she can get another pneumonia shot.

## 2020-09-16 DIAGNOSIS — I129 Hypertensive chronic kidney disease with stage 1 through stage 4 chronic kidney disease, or unspecified chronic kidney disease: Secondary | ICD-10-CM | POA: Insufficient documentation

## 2020-09-16 DIAGNOSIS — K449 Diaphragmatic hernia without obstruction or gangrene: Secondary | ICD-10-CM | POA: Insufficient documentation

## 2020-09-16 DIAGNOSIS — I700 Atherosclerosis of aorta: Secondary | ICD-10-CM | POA: Insufficient documentation

## 2020-09-16 DIAGNOSIS — K219 Gastro-esophageal reflux disease without esophagitis: Secondary | ICD-10-CM | POA: Diagnosis not present

## 2020-09-16 DIAGNOSIS — N183 Chronic kidney disease, stage 3 unspecified: Secondary | ICD-10-CM | POA: Diagnosis not present

## 2020-09-16 DIAGNOSIS — R109 Unspecified abdominal pain: Secondary | ICD-10-CM | POA: Diagnosis not present

## 2020-09-16 DIAGNOSIS — R079 Chest pain, unspecified: Secondary | ICD-10-CM | POA: Diagnosis present

## 2020-09-16 DIAGNOSIS — Z79899 Other long term (current) drug therapy: Secondary | ICD-10-CM | POA: Insufficient documentation

## 2020-09-16 NOTE — ED Triage Notes (Signed)
EMS brings pt in from home for c/o abd pain; tums & 325mg  ASA PTA

## 2020-09-17 ENCOUNTER — Other Ambulatory Visit: Payer: Self-pay

## 2020-09-17 ENCOUNTER — Emergency Department: Payer: Medicare Other

## 2020-09-17 ENCOUNTER — Emergency Department
Admission: EM | Admit: 2020-09-17 | Discharge: 2020-09-17 | Disposition: A | Discharge status: Home / Self Care | Payer: Medicare Other | Attending: Emergency Medicine | Admitting: Emergency Medicine

## 2020-09-17 ENCOUNTER — Encounter: Payer: Self-pay | Admitting: Emergency Medicine

## 2020-09-17 DIAGNOSIS — K219 Gastro-esophageal reflux disease without esophagitis: Secondary | ICD-10-CM

## 2020-09-17 LAB — CBC
HCT: 39.5 % (ref 36.0–46.0)
Hemoglobin: 13.4 g/dL (ref 12.0–15.0)
MCH: 32.6 pg (ref 26.0–34.0)
MCHC: 33.9 g/dL (ref 30.0–36.0)
MCV: 96.1 fL (ref 80.0–100.0)
Platelets: 321 10*3/uL (ref 150–400)
RBC: 4.11 MIL/uL (ref 3.87–5.11)
RDW: 12.6 % (ref 11.5–15.5)
WBC: 7.3 10*3/uL (ref 4.0–10.5)
nRBC: 0 % (ref 0.0–0.2)

## 2020-09-17 LAB — URINALYSIS, COMPLETE (UACMP) WITH MICROSCOPIC
Bacteria, UA: NONE SEEN
Bilirubin Urine: NEGATIVE
Glucose, UA: NEGATIVE mg/dL
Ketones, ur: NEGATIVE mg/dL
Leukocytes,Ua: NEGATIVE
Nitrite: NEGATIVE
Protein, ur: NEGATIVE mg/dL
Specific Gravity, Urine: 1.003 — ABNORMAL LOW (ref 1.005–1.030)
pH: 7 (ref 5.0–8.0)

## 2020-09-17 LAB — HEPATIC FUNCTION PANEL
ALT: 19 U/L (ref 0–44)
AST: 30 U/L (ref 15–41)
Albumin: 3.9 g/dL (ref 3.5–5.0)
Alkaline Phosphatase: 114 U/L (ref 38–126)
Bilirubin, Direct: <0.1 mg/dL (ref 0.0–0.2)
Total Bilirubin: 0.6 mg/dL (ref 0.3–1.2)
Total Protein: 7.6 g/dL (ref 6.5–8.1)

## 2020-09-17 LAB — BASIC METABOLIC PANEL
Anion gap: 11 (ref 5–15)
BUN: 24 mg/dL — ABNORMAL HIGH (ref 8–23)
CO2: 26 mmol/L (ref 22–32)
Calcium: 9.4 mg/dL (ref 8.9–10.3)
Chloride: 103 mmol/L (ref 98–111)
Creatinine, Ser: 1.31 mg/dL — ABNORMAL HIGH (ref 0.44–1.00)
GFR, Estimated: 41 mL/min — ABNORMAL LOW (ref >60)
Glucose, Bld: 107 mg/dL — ABNORMAL HIGH (ref 70–99)
Potassium: 3.9 mmol/L (ref 3.5–5.1)
Sodium: 140 mmol/L (ref 135–145)

## 2020-09-17 LAB — TROPONIN I (HIGH SENSITIVITY)
Troponin I (High Sensitivity): 13 ng/L (ref <18)
Troponin I (High Sensitivity): 15 ng/L (ref <18)

## 2020-09-17 LAB — LIPASE, BLOOD: Lipase: 28 U/L (ref 11–51)

## 2020-09-17 MED ORDER — FAMOTIDINE IN NACL 20-0.9 MG/50ML-% IV SOLN
20.0000 mg | Freq: Once | INTRAVENOUS | Status: AC
  Administered 2020-09-17: 20 mg via INTRAVENOUS
  Filled 2020-09-17: qty 50

## 2020-09-17 MED ORDER — IOHEXOL 350 MG/ML SOLN
70.0000 mL | Freq: Once | INTRAVENOUS | Status: AC | PRN
  Administered 2020-09-17: 70 mL via INTRAVENOUS

## 2020-09-17 NOTE — Discharge Instructions (Addendum)
Continue medications as prescribed.  Follow-up with your primary care doctor.  Return to the emergency room for chest pain, shortness of breath, abdominal pain or back pain

## 2020-09-17 NOTE — ED Triage Notes (Signed)
Pt to ED via EMS from home c/o HTN, chest pain and abd pain this evening.  States pain feels like indigestion.  Pt states main concern was because of BP being "200 over something" tonight.  Given 4 baby ASA en route via EMS, denies pain at this time or SOB.  Pt speaking in complete and coherent sentences, skin WNL, in NAD at this time.

## 2020-09-17 NOTE — ED Provider Notes (Signed)
[facility] Emergency Department Provider Note  ____________________________________________  Time seen: Approximately 4:47 AM  I have reviewed the triage vital signs and the nursing notes.   HISTORY  Chief Complaint Hypertension and Chest Pain   HPI Carrie Noble is a 80 y.o. female with a history of GERD, hiatal hernia status post Nissen fundoplication, hyperlipidemia, thyroid disease, hypertension who presents for evaluation of elevated blood pressure and abdominal pain.  Patient reports that she was having indigestion earlier this evening.  She reports that she suffers frequently from indigestion pain.  However this evening she reports that the pain was markedly severe to the point that she felt she was having a heart attack.  She describes the pain as pressure located in her epigastric region.  She was trying to relax and go to sleep but could not due to the severity of the pain.  She denies nausea or vomiting associated with it.  The pain did not radiate to the back or up into chest.  No shortness of breath.  Patient then decided to check her blood pressure and found that the systolics were elevated in the 190s.  Patient reports that she would have not come to the emergency room for the pain since she is used to having pain like this but the blood pressure worried her.  At this time she denies having any pain.  She had no nausea or vomiting.   Past Medical History:  Diagnosis Date   Allergy    Arthritis    Gastroesophageal reflux    Hiatal hernia 1989   status post Nissen fundoplication    Hx of hysterectomy    Hyperlipidemia    Tachycardia    Patient stated that this has been occuring frequently.   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   Chest pain 12/03/2017   Vitamin D deficiency 11/30/2017   Leg weakness 11/30/2017   Benign essential HTN 10/24/2016   Stress 09/04/2016   Urge incontinence 09/04/2016   Headache 02/16/2016   Dry eyes 02/16/2016   Acute bronchitis 11/26/2015   Memory change 10/11/2015   Allergic rhinitis 05/06/2015   Breast tenderness in female 09/14/2014   Pain in right hip 07/14/2014   Carotid bruit 07/14/2014   Difficulty sleeping 03/30/2014   Health care maintenance 03/30/2014   Diverticulosis 03/27/2013   Chronic cystitis 12/31/2012   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   Abnormal mammogram 09/06/2012   Osteoporosis 05/29/2012   Thyroid nodule 05/28/2012   Barrett's esophagus 05/28/2012   Hypercholesterolemia 01/03/2011   Palpitations 01/03/2011   GERD (gastroesophageal reflux disease) 01/03/2011    Past Surgical History:  Procedure Laterality Date   ABDOMINAL HYSTERECTOMY  1980   partial   BREAST BIOPSY Right    neg   BREAST EXCISIONAL BIOPSY Left 2014   neg   COLONOSCOPY  2015   ESOPHAGOGASTRIC FUNDOPLICATION  1999   LAPAROSCOPIC NISSEN FUNDOPLICATION  1999   TONSILLECTOMY     as well  as goiter   UPPER GI ENDOSCOPY  2015    Prior to Admission medications   Medication Sig Start Date End Date Taking? Authorizing Provider  atorvastatin (LIPITOR) 10 MG tablet One tablet q Monday, Wednesday and Friday. 06/03/19   Dale Garfield, MD  Biotin 1000 MCG tablet Take 1,000 mcg by mouth 3 (three) times daily. Reported on 05/13/2015    [provider]  Calcium Carbonate (CALCIUM 600 PO) Take by mouth daily. Reported on 05/13/2015    [provider]  Cholecalciferol (VITAMIN D  PO) Take by mouth daily. Reported on 05/13/2015    [provider]  Epinastine HCl 0.05 % ophthalmic solution Place 1 drop into both eyes 2 (two) times daily as needed. 08/15/17   Alfonse Spruce, MD  famotidine (PEPCID) 20 MG tablet Take 20 mg by mouth at bedtime. 09/11/19   [provider]  hydrocortisone 2.5 % cream  01/09/18   [provider]  neomycin-polymyxin-dexamethasone (MAXITROL) 0.1 % ophthalmic suspension Place 1 drop into both eyes 2 (two) times daily.    [provider]  Olopatadine HCl (PAZEO) 0.7 % SOLN Place 1 drop into both eyes daily. 08/07/17   Alfonse Spruce, MD  omeprazole (PRILOSEC) 20 MG capsule Take 20 mg by mouth daily. 02/07/19   [provider]  pantoprazole (PROTONIX) 40 MG tablet Take 40 mg by mouth every morning. 09/11/19   [provider]  Polyethyl Glycol-Propyl Glycol 0.4-0.3 % SOLN Apply to eye.    [provider]  polyethylene glycol (MIRALAX) 17 g packet Take 17 g by mouth daily. 08/13/20   Bing Neighbors, FNP  triamcinolone (KENALOG) 0.1 % paste SMARTSIG:Sparingly TO TEETH Twice Daily 07/19/19   [provider]  Vibegron 75 MG TABS Take 75 mg by mouth daily. 06/17/19   Alfredo Martinez, MD  vitamin B-12 (CYANOCOBALAMIN) 1000 MCG tablet Take 1,000 mcg by mouth daily. Reported on 05/13/2015    [provider]    Allergies Darvocet [propoxyphene n-acetaminophen] and Morphine and related  Family History  Problem Relation Age of Onset   Stroke Mother 25   Arthritis Mother    Heart disease Mother    Hypertension Mother    Stroke Father 62   Hypertension Father    Rheumatic fever Brother        and multiple open heart surgeries   Diabetes Brother        type 2   Stroke Brother    Other Sister        Coronary Atherosclerosis   Stroke Brother    Stroke Sister    Heart disease Sister    Dementia Sister    Cancer Sister        ovarian   Breast cancer Neg Hx      Social History Social History   Tobacco Use   Smoking status: Never   Smokeless tobacco: Never  Substance Use Topics   Alcohol use: No    Alcohol/week: 0.0 standard drinks   Drug use: No    Review of Systems  Constitutional: Negative for fever. Eyes: Negative for visual changes. ENT: Negative for sore throat. Neck: No neck pain  Cardiovascular: Negative for chest pain. Respiratory: Negative for shortness of breath. Gastrointestinal: + epigastric abdominal pain. No vomiting or diarrhea. Genitourinary: Negative for dysuria. Musculoskeletal: Negative for back pain. Skin: Negative for rash. Neurological: Negative for headaches, weakness or numbness. Psych: No SI or HI  ____________________________________________   PHYSICAL EXAM:  VITAL SIGNS: ED  Triage Vitals  Enc Vitals Group     BP 09/17/20 0041 (!) 156/80     Pulse Rate 09/17/20 0041 72     Resp 09/17/20 0041 16     Temp 09/17/20 0041 98.1 F (36.7 C)     Temp Source 09/17/20 0041 Oral     SpO2 09/17/20 0041 98 %     Weight 09/17/20 0042 135 lb (61.2 kg)     Height 09/17/20 0042 5\' 4"  (1.626 m)     Head Circumference --      Peak Flow --      Pain Score 09/17/20 0041 0     Pain Loc --      Pain Edu? --      Excl. in GC? --     Constitutional: Alert and oriented. Well appearing and in no apparent distress. HEENT:      Head: Normocephalic and atraumatic.         Eyes: Conjunctivae are normal. Sclera is non-icteric.       Mouth/Throat: Mucous membranes are moist.       Neck: Supple with no signs of meningismus. Cardiovascular: Regular rate and rhythm. No murmurs, gallops, or rubs. 2+ symmetrical distal pulses are present in all extremities. No JVD. Respiratory: Normal respiratory effort. Lungs are clear to auscultation bilaterally.  Gastrointestinal: Soft, epigastric tenderness to palpation, and non distended with positive bowel sounds. No rebound or guarding. Genitourinary: No CVA  tenderness. Musculoskeletal:  No edema, cyanosis, or erythema of extremities. Neurologic: Normal speech and language. Face is symmetric. Moving all extremities. No gross focal neurologic deficits are appreciated. Skin: Skin is warm, dry and intact. No rash noted. Psychiatric: Mood and affect are normal. Speech and behavior are normal.  ____________________________________________   LABS (all labs ordered are listed, but only abnormal results are displayed)  Labs Reviewed  BASIC METABOLIC PANEL - Abnormal; Notable for the following components:      Result Value   Glucose, Bld 107 (*)    BUN 24 (*)    Creatinine, Ser 1.31 (*)    GFR, Estimated 41 (*)    All other components within normal limits  URINALYSIS, COMPLETE (UACMP) WITH MICROSCOPIC - Abnormal; Notable for the following components:   Color, Urine COLORLESS (*)    APPearance CLEAR (*)    Specific Gravity, Urine 1.003 (*)    Hgb urine dipstick SMALL (*)    All other components within normal limits  CBC  HEPATIC FUNCTION PANEL  LIPASE, BLOOD  TROPONIN I (HIGH SENSITIVITY)  TROPONIN I (HIGH SENSITIVITY)   ____________________________________________  EKG  ED ECG REPORT I, Nita Sicklearolina Wilmot Quevedo, the attending physician, personally viewed and interpreted this ECG.  Normal sinus rhythm, rate of 76, normal intervals, normal axis, no ST elevations or depressions, occasional PVC ____________________________________________  RADIOLOGY  I have personally reviewed the images performed during this visit and I agree with the Radiologist's read.   Interpretation by Radiologist:  DG Chest 2 View  Result Date: 09/17/2020 CLINICAL DATA:  Chest pain, unspecified abdominal pain, hypertension EXAM: CHEST - 2 VIEW COMPARISON:  None. FINDINGS: The lungs are mildly, symmetrically hyperinflated in keeping with changes of underlying COPD. No superimposed confluent pulmonary infiltrate. No pneumothorax or pleural effusion. Cardiac size within  normal limits. Pulmonary vascularity is normal. No acute bone abnormality. IMPRESSION: No active cardiopulmonary disease.  COPD. Electronically Signed   By: Helyn NumbersAshesh  Parikh M.D.   On: 09/17/2020 00:59   CT ABDOMEN PELVIS W CONTRAST  Result Date: 09/17/2020  CLINICAL DATA:  80 year old female with abdominal pain. EXAM: CT ABDOMEN AND PELVIS WITH CONTRAST TECHNIQUE: Multidetector CT imaging of the abdomen and pelvis was performed using the standard protocol following bolus administration of intravenous contrast. CONTRAST:  23mL OMNIPAQUE IOHEXOL 350 MG/ML SOLN COMPARISON:  CT Abdomen and Pelvis 08/24/2018. Abdomen MRI 09/18/2018. FINDINGS: Lower chest: Mild chronic lung base scarring is stable. No cardiomegaly, pericardial or pleural effusion. Moderate size gastric hiatal hernia is stable. Hepatobiliary: Negative liver and gallbladder. Pancreas: Mildly atrophied, otherwise negative. Spleen: Stable and negative with incidental splenule (normal variant). Adrenals/Urinary Tract: Normal adrenal glands. Symmetric renal enhancement and contrast excretion. Extrarenal pelvis suspected on the right (normal variant). No renal mass. Right ureter appears mildly larger than the left in the abdomen, but no ureteral calculus or obstructing etiology is identified. Chronic pelvic phleboliths. Unremarkable bladder. Stomach/Bowel: Negative rectum. Mildly redundant sigmoid colon with extensive diverticulosis along the left pelvic sidewall and in the left lower quadrant. Similar extensive diverticulosis of the descending colon. No active inflammation. Redundant transverse and right colon with diverticulosis most pronounced at the hepatic flexure. No active inflammation. Normal appendix on series 2, image 53. Negative terminal ileum. No dilated small bowel. Aside from the moderate chronic hiatal hernia the stomach appears within normal limits. Duodenum is decompressed. There is a distal duodenal diverticulum near the ligament of Treitz  with no active inflammation. No free air or free fluid. No mesenteric stranding identified. Vascular/Lymphatic: Mild for age Aortoiliac calcified atherosclerosis. Normal caliber abdominal aorta. Major arterial structures are patent. Portal venous system is patent. No lymphadenopathy. Reproductive: Surgically absent uterus. Diminutive or absent ovaries. Other: No pelvic free fluid. Musculoskeletal: No acute osseous abnormality identified. IMPRESSION: 1. No acute or inflammatory process identified in the abdomen or pelvis. 2. Fairly extensive large bowel diverticulosis without active inflammation. Normal appendix. Chronic moderate size gastric hiatal hernia. 3. Aortic Atherosclerosis (ICD10-I70.0). Electronically Signed   By: Odessa Fleming M.D.   On: 09/17/2020 05:18     ____________________________________________   PROCEDURES  Procedure(s) performed:yes .1-3 Lead EKG Interpretation  Date/Time: 09/17/2020 5:16 AM Performed by: Nita Sickle, MD Authorized by: Nita Sickle, MD     Interpretation: non-specific     ECG rate assessment: normal     Rhythm: sinus rhythm     Ectopy: PVCs     Conduction: normal     Critical Care performed:  None ____________________________________________   INITIAL IMPRESSION / ASSESSMENT AND PLAN / ED COURSE  80 y.o. female with a history of GERD, hiatal hernia status post Nissen fundoplication, hyperlipidemia, thyroid disease, hypertension who presents for evaluation of elevated blood pressure and abdominal pain.  Patient presented with severe dull epigastric abdominal pain associated with elevated blood pressure.  She reports that her pain is mostly subsided at this time but she is still mildly tender to palpation epigastric region with no rebound or guarding, no right upper quadrant tenderness.  Vitals are within normal limits other than mildly elevated BP.  EKG with no signs of acute ischemic changes  Differential diagnosis including GERD/indigestion  versus pancreatitis versus gallbladder pathology versus kidney stone versus esophageal spasms versus ACS  Labs showing normal LFTs and lipase, normal urinalysis, 2 high-sensitivity troponins that were negative, labs with no significant electrolyte derangements, mildly elevated creatinine.  CBC with no leukocytosis or anemia.  Chest x-ray visualized by me with no acute findings.  CT abdomen pelvis pending.  We will give a dose of IV Pepcid.  Patient placed on telemetry for close monitoring of her  cardiorespiratory status.  Old medical records reviewed.  History gathered from patient and her husband  _________________________ 5:57 AM on 09/17/2020 -----------------------------------------  CT abdomen pelvis showing no acute pathology other than a moderate size hiatal hernia which is most likely contributing to patient's symptoms of indigestion.  She remains pain-free and feels better after Pepcid.  Blood pressure has improved and currently in the 150s.  Will discharge home with follow-up with her primary care doctor my standard return precautions.  We did discuss dietary changes for GERD, avoiding meals within 3 hours of bedtime, avoiding spicy food and alcohol.    _____________________________________________ Please note:  Patient was evaluated in Emergency Department today for the symptoms described in the history of present illness. Patient was evaluated in the context of the global COVID-19 pandemic, which necessitated consideration that the patient might be at risk for infection with the SARS-CoV-2 virus that causes COVID-19. Institutional protocols and algorithms that pertain to the evaluation of patients at risk for COVID-19 are in a state of rapid change based on information released by regulatory bodies including the CDC and federal and state organizations. These policies and algorithms were followed during the patient's care in the ED.  Some ED evaluations and interventions may be delayed as a  result of limited staffing during the pandemic.   [place] Controlled Substance Database was reviewed by me. ____________________________________________   FINAL CLINICAL IMPRESSION(S) / ED DIAGNOSES   Final diagnoses:  Gastroesophageal reflux disease, unspecified whether esophagitis present      NEW MEDICATIONS STARTED DURING THIS VISIT:  ED Discharge Orders     None        Note:  This document was prepared using Dragon voice recognition software and may include unintentional dictation errors.    Nita Sickle, MD 09/17/20 [phone]

## 2020-09-18 ENCOUNTER — Encounter: Payer: Self-pay | Admitting: Family Medicine

## 2020-09-18 ENCOUNTER — Ambulatory Visit (INDEPENDENT_AMBULATORY_CARE_PROVIDER_SITE_OTHER): Payer: Medicare Other | Admitting: Family Medicine

## 2020-09-18 DIAGNOSIS — I10 Essential (primary) hypertension: Secondary | ICD-10-CM

## 2020-09-18 DIAGNOSIS — K219 Gastro-esophageal reflux disease without esophagitis: Secondary | ICD-10-CM

## 2020-09-18 DIAGNOSIS — J309 Allergic rhinitis, unspecified: Secondary | ICD-10-CM | POA: Diagnosis not present

## 2020-09-18 MED ORDER — AZELASTINE HCL 0.1 % NA SOLN: 1.0000 | Freq: Two times a day (BID) | NASAL | 1 refills | Status: DC

## 2020-09-18 NOTE — Patient Instructions (Signed)
Nice to see you. Please check your blood pressure daily for the next week and write the results down.  We will have you return for nurse blood pressure check in 1 week. Please try the Astelin to see if that helps with your nasal issues.

## 2020-09-22 ENCOUNTER — Ambulatory Visit: Payer: Medicare Other | Admitting: Family Medicine

## 2020-09-22 NOTE — Assessment & Plan Note (Signed)
She will continue with her Protonix.

## 2020-09-22 NOTE — Progress Notes (Signed)
Marikay Alar, MD Phone: [phone]  Carrie Noble is a 80 y.o. female who presents today for follow-up.  Hypertension/indigestion: Patient notes she previously developed some stomach discomfort moving into her chest after taking a Z-Pak.  The indigestion was severe enough that she felt as though she was having a heart attack and went to the emergency department.  Her work-up was negative for a cardiac cause of her symptoms.  Her blood pressure was elevated.  She notes since discharge from the ED her blood pressure has been 140/80.  She is not on medication for her blood pressure.  No chest pain or shortness of breath.  She was treated with the azithromycin for respiratory illness and does note some ongoing issues with runny nose related to allergies.  She reports she is on a PPI.  Overall she is much better.  Social History   Tobacco Use  Smoking Status Never  Smokeless Tobacco Never    Current Outpatient Medications on File Prior to Visit  Medication Sig Dispense Refill   atorvastatin (LIPITOR) 10 MG tablet One tablet q Monday, Wednesday and Friday. 40 tablet 1   azithromycin (ZITHROMAX) 250 MG tablet Take 250 mg by mouth as directed.     Biotin 1000 MCG tablet Take 1,000 mcg by mouth 3 (three) times daily. Reported on 05/13/2015     Calcium Carbonate (CALCIUM 600 PO) Take by mouth daily. Reported on 05/13/2015     Cholecalciferol (VITAMIN D PO) Take by mouth daily. Reported on 05/13/2015     Epinastine HCl 0.05 % ophthalmic solution Place 1 drop into both eyes 2 (two) times daily as needed. 10 mL 5   famotidine (PEPCID) 20 MG tablet Take 20 mg by mouth at bedtime.     hydrocortisone 2.5 % cream      loteprednol (LOTEMAX) 0.5 % ophthalmic suspension 1 drop 3 (three) times daily.     neomycin-polymyxin-dexamethasone (MAXITROL) 0.1 % ophthalmic suspension Place 1 drop into both eyes 2 (two) times daily.     Olopatadine HCl (PAZEO) 0.7 % SOLN Place 1 drop into both eyes daily. 1 Bottle 5    omeprazole (PRILOSEC) 20 MG capsule Take 20 mg by mouth daily.     pantoprazole (PROTONIX) 40 MG tablet Take 40 mg by mouth every morning.     Polyethyl Glycol-Propyl Glycol 0.4-0.3 % SOLN Apply to eye.     polyethylene glycol (MIRALAX) 17 g packet Take 17 g by mouth daily. 14 each 0   prednisoLONE acetate (PRED FORTE) 1 % ophthalmic suspension Place 1 drop into the left eye 4 (four) times daily.     triamcinolone (KENALOG) 0.1 % paste SMARTSIG:Sparingly TO TEETH Twice Daily     Vibegron 75 MG TABS Take 75 mg by mouth daily. 30 tablet 11   vitamin B-12 (CYANOCOBALAMIN) 1000 MCG tablet Take 1,000 mcg by mouth daily. Reported on 05/13/2015     Current Facility-Administered Medications on File Prior to Visit  Medication Dose Route Frequency Provider Last Rate Last Admin   betamethasone acetate-betamethasone sodium phosphate (CELESTONE) injection 3 mg  3 mg Intramuscular Once Gala Lewandowsky M, DPM       betamethasone acetate-betamethasone sodium phosphate (CELESTONE) injection 3 mg  3 mg Intramuscular Once Felecia Shelling, DPM         ROS see history of present illness  Objective  Physical Exam Vitals:   09/18/20 1616 09/18/20 1635  BP: (!) 158/92 130/80  Pulse: 80   SpO2: 95%  BP Readings from Last 3 Encounters:  09/18/20 130/80  09/17/20 (!) 157/89  08/13/20 (!) 149/83   Wt Readings from Last 3 Encounters:  09/18/20 134 lb 12.8 oz (61.1 kg)  09/17/20 135 lb (61.2 kg)  08/13/20 130 lb (59 kg)    Physical Exam Constitutional:      General: She is not in acute distress.    Appearance: She is not diaphoretic.  Cardiovascular:     Rate and Rhythm: Normal rate and regular rhythm.     Heart sounds: Normal heart sounds.  Pulmonary:     Effort: Pulmonary effort is normal.     Breath sounds: Normal breath sounds.  Abdominal:     General: Bowel sounds are normal. There is no distension.     Palpations: Abdomen is soft.     Tenderness: There is no abdominal tenderness. There  is no guarding or rebound.  Skin:    General: Skin is warm and dry.  Neurological:     Mental Status: She is alert.     Assessment/Plan: Please see individual problem list.  Problem List Items Addressed This Visit     Allergic rhinitis (Chronic)    We will trial Astelin nasal spray to see if that will help with her chronic rhinorrhea.      Benign essential HTN (Chronic)    Adequate on recheck.  She will monitor.      GERD (gastroesophageal reflux disease) (Chronic)    She will continue with her Protonix.       Return in about 1 week (around 09/25/2020) for Nurse BP check.  This visit occurred during the SARS-CoV-2 public health emergency.  Safety protocols were in place, including screening questions prior to the visit, additional usage of staff PPE, and extensive cleaning of exam room while observing appropriate contact time as indicated for disinfecting solutions.    Marikay Alar, MD [facility]

## 2020-09-22 NOTE — Assessment & Plan Note (Signed)
Adequate on recheck.  She will monitor.

## 2020-09-22 NOTE — Assessment & Plan Note (Signed)
We will trial Astelin nasal spray to see if that will help with her chronic rhinorrhea.

## 2020-09-25 ENCOUNTER — Ambulatory Visit (INDEPENDENT_AMBULATORY_CARE_PROVIDER_SITE_OTHER): Payer: Medicare Other

## 2020-09-25 ENCOUNTER — Other Ambulatory Visit: Payer: Self-pay

## 2020-09-25 VITALS — BP 125/74 | HR 72

## 2020-09-25 DIAGNOSIS — I10 Essential (primary) hypertension: Secondary | ICD-10-CM | POA: Diagnosis not present

## 2020-09-25 NOTE — Progress Notes (Signed)
Patient is here for a BP check due to bp being high at last visit, as per patient.  Currently patients BP is 125/74 and BPM is 72.  Patient has no complaints of headaches, blurry vision, chest pain, arm pain, light headedness, dizziness, and nor jaw pain. Please see previous note for order.

## 2020-10-10 ENCOUNTER — Other Ambulatory Visit: Payer: Self-pay | Admitting: Family Medicine

## 2020-10-26 ENCOUNTER — Other Ambulatory Visit: Payer: Self-pay | Admitting: Family Medicine

## 2020-10-28 ENCOUNTER — Telehealth: Payer: Self-pay

## 2020-10-28 NOTE — Telephone Encounter (Signed)
Providing access nurse documentation.      

## 2020-10-28 NOTE — Telephone Encounter (Signed)
Called to speak with Carrie Noble. Was informed that she is out of the house at this moment to pick up medication. Gave a verbal message to call back.

## 2020-10-29 NOTE — Telephone Encounter (Signed)
Reviewed access nurse note.  Apparently having vomiting, congestion, etc.  Given age, risk factors, etc - feel needs an appt to see what treatment is needed.  Will need to reschedule her routine lab and f/u appt.

## 2020-10-30 NOTE — Telephone Encounter (Signed)
Pt continuing to get better each day. Did not feel visit was necessary for acute symptoms. Patient has been rescheduled for routine labs and f/u

## 2020-11-03 ENCOUNTER — Other Ambulatory Visit: Payer: Medicare Other

## 2020-11-05 ENCOUNTER — Telehealth: Payer: Self-pay | Admitting: Internal Medicine

## 2020-11-05 ENCOUNTER — Ambulatory Visit: Payer: Medicare Other | Admitting: Internal Medicine

## 2020-11-05 NOTE — Telephone Encounter (Signed)
Patient states she is currently using 6 eye drops daily. States the cough came with using the drops. Thinks she is allergic to these drops.   Patient scheduled to see the eye doctor tomorrow.

## 2020-11-05 NOTE — Telephone Encounter (Signed)
If she is having increased cough, etc - needs to be evaluated to determine best treatment needed.

## 2020-11-05 NOTE — Telephone Encounter (Signed)
Left message for patient to return call back.  Please see previous messages

## 2020-11-05 NOTE — Telephone Encounter (Signed)
Patient wanting a call back on her cell phone  [phone]

## 2020-11-05 NOTE — Telephone Encounter (Signed)
Left message for patient to return call back.   Would be best for patient to be set up with a virtual at another office. Kriste Basque has a lot of openings this afternoon.

## 2020-11-05 NOTE — Telephone Encounter (Signed)
Patient is getting over COVID. She is still have a stuffy nose, coughing up green mucus. She refused to be triaged, she just wants Dr Carrie Noble to call something.

## 2020-11-05 NOTE — Telephone Encounter (Signed)
Reviewed chart - covid positive 10/28/20.  Per note, wanting something called in?  Need to know specific symptoms and if she is having persistent or worsening symptoms, she needs to be evaluated.

## 2020-11-10 NOTE — Telephone Encounter (Signed)
Spoken to patient. She stated she wants to wait on appointment she is doing better. She stated she is using antihistamine and saline nasal spray per eye doctor recommendations. She stated if sx worsen or doesn't change further she will contact PCP office.

## 2020-12-02 ENCOUNTER — Other Ambulatory Visit: Payer: Medicare Other

## 2020-12-03 ENCOUNTER — Ambulatory Visit (INDEPENDENT_AMBULATORY_CARE_PROVIDER_SITE_OTHER): Payer: Medicare Other | Admitting: Internal Medicine

## 2020-12-03 ENCOUNTER — Other Ambulatory Visit: Payer: Self-pay

## 2020-12-03 VITALS — BP 142/80 | HR 80 | Temp 98.0°F | Resp 16 | Ht 64.0 in | Wt 133.8 lb

## 2020-12-03 DIAGNOSIS — E041 Nontoxic single thyroid nodule: Secondary | ICD-10-CM

## 2020-12-03 DIAGNOSIS — R300 Dysuria: Secondary | ICD-10-CM

## 2020-12-03 DIAGNOSIS — N1831 Chronic kidney disease, stage 3a: Secondary | ICD-10-CM

## 2020-12-03 DIAGNOSIS — E7800 Pure hypercholesterolemia, unspecified: Secondary | ICD-10-CM | POA: Diagnosis not present

## 2020-12-03 DIAGNOSIS — R739 Hyperglycemia, unspecified: Secondary | ICD-10-CM

## 2020-12-03 DIAGNOSIS — I700 Atherosclerosis of aorta: Secondary | ICD-10-CM

## 2020-12-03 DIAGNOSIS — R109 Unspecified abdominal pain: Secondary | ICD-10-CM | POA: Diagnosis not present

## 2020-12-03 DIAGNOSIS — I10 Essential (primary) hypertension: Secondary | ICD-10-CM | POA: Diagnosis not present

## 2020-12-03 DIAGNOSIS — K219 Gastro-esophageal reflux disease without esophagitis: Secondary | ICD-10-CM

## 2020-12-03 DIAGNOSIS — Z23 Encounter for immunization: Secondary | ICD-10-CM

## 2020-12-03 DIAGNOSIS — F439 Reaction to severe stress, unspecified: Secondary | ICD-10-CM

## 2020-12-03 MED ORDER — SERTRALINE HCL 25 MG PO TABS: 25.0000 mg | ORAL_TABLET | Freq: Every day | ORAL | 1 refills | Status: DC

## 2020-12-03 NOTE — Progress Notes (Signed)
Patient ID: Carrie Noble, female   DOB: [birth year], 80 y.o.   MRN: [id number]   Subjective:    Patient ID: Carrie Noble, female    DOB: [birth year], 80 y.o.   MRN: [id number]  This visit occurred during the SARS-CoV-2 public health emergency.  Safety protocols were in place, including screening questions prior to the visit, additional usage of staff PPE, and extensive cleaning of exam room while observing appropriate contact time as indicated for disinfecting solutions.   Patient here for a scheduled follow up.   Chief Complaint  Patient presents with   Hypertension   Abdominal Pain        .   HPI Evaluated in ER 09/17/20 for elevated blood pressure and abdominal discomfort.  W/up negative for cardiac etiology.  Given IV pepcid.  Was reevaluated by Dr Caryl Bis 09/18/20.  Reported no chest pain or sob.  Blood pressure remaining 140/80.  Reported ongoing issues with runny nose. Prescribed astelin.  She has been having issues with her eyes.  Previous swelling and irritation.  Seeing eye doctor. Using drops.  Is better.  Denies any chest pain.  Eating.  Followed by GI.  GERD appears to be stable.  Taking stool softeners to help with bowels.  She does report increased lower abdominal pressure and dysuria - noticed recently.  Was questioning if she could have a UTI.  Did test positive for covid 10/28/20.  Denies residual problems from this. Increased stress. Discussed.     Past Medical History:  Diagnosis Date   Allergy    Arthritis    Gastroesophageal reflux    Hiatal hernia 1989   status post Nissen fundoplication    Hx of hysterectomy    Hyperlipidemia    Tachycardia    Patient stated that this has been occuring frequently.   Thyroid disease    Goiter   Past Surgical History:  Procedure Laterality Date   ABDOMINAL HYSTERECTOMY  1980   partial   BREAST BIOPSY Right    neg   BREAST EXCISIONAL BIOPSY Left 2014   neg   COLONOSCOPY  2015   ESOPHAGOGASTRIC FUNDOPLICATION  9767    LAPAROSCOPIC NISSEN FUNDOPLICATION  3419   TONSILLECTOMY     as well as goiter   UPPER GI ENDOSCOPY  2015   Family History  Problem Relation Age of Onset   Stroke Mother 63   Arthritis Mother    Heart disease Mother    Hypertension Mother    Stroke Father 85   Hypertension Father    Rheumatic fever Brother        and multiple open heart surgeries   Diabetes Brother        type 2   Stroke Brother    Other Sister        Coronary Atherosclerosis   Stroke Brother    Stroke Sister    Heart disease Sister    Dementia Sister    Cancer Sister        ovarian   Breast cancer Neg Hx    Social History   Socioeconomic History   Marital status: Married    Spouse name: Not on file   Number of children: Not on file   Years of education: Not on file   Highest education level: Not on file  Occupational History   Occupation: Retired    Fish farm manager: OTHER  Tobacco Use   Smoking status: Never   Smokeless tobacco: Never  Substance and Sexual Activity  Alcohol use: No    Alcohol/week: 0.0 standard drinks   Drug use: No   Sexual activity: Never  Other Topics Concern   Not on file  Social History Narrative   Occasionally drinks coffee.   Social Determinants of Health   Financial Resource Strain: Low Risk    Difficulty of Paying Living Expenses: Not hard at all  Food Insecurity: No Food Insecurity   Worried About Charity fundraiser in the Last Year: Never true   [place] in the Last Year: Never true  Transportation Needs: No Transportation Needs   Lack of Transportation (Medical): No   Lack of Transportation (Non-Medical): No  Physical Activity: Insufficiently Active   Days of Exercise per Week: 7 days   Minutes of Exercise per Session: 20 min  Stress: No Stress Concern Present   Feeling of Stress : Not at all  Social Connections: Unknown   Frequency of Communication with Friends and Family: Not on file   Frequency of Social Gatherings with Friends and Family: Not on  file   Attends Religious Services: Not on file   Active Member of Clubs or Organizations: Not on file   Attends Archivist Meetings: Not on file   Marital Status: Married     Review of Systems  Constitutional:  Negative for appetite change and unexpected weight change.  HENT:  Negative for congestion and sinus pressure.   Respiratory:  Negative for cough, chest tightness and shortness of breath.   Cardiovascular:  Negative for chest pain, palpitations and leg swelling.  Gastrointestinal:  Negative for diarrhea, nausea and vomiting.       Lower abdominal pressure.    Genitourinary:  Positive for dysuria. Negative for difficulty urinating.  Musculoskeletal:  Negative for joint swelling and myalgias.  Skin:  Negative for color change and rash.  Neurological:  Negative for dizziness, light-headedness and headaches.  Psychiatric/Behavioral:  Negative for agitation and dysphoric mood.       Objective:     BP (!) 142/80   Pulse 80   Temp 98 F (36.7 C)   Resp 16   Ht 5' 4"  (1.626 m)   Wt 133 lb 12.8 oz (60.7 kg)   SpO2 98%   BMI 22.97 kg/m  Wt Readings from Last 3 Encounters:  12/03/20 133 lb 12.8 oz (60.7 kg)  09/18/20 134 lb 12.8 oz (61.1 kg)  09/17/20 135 lb (61.2 kg)    Physical Exam Vitals reviewed.  Constitutional:      General: She is not in acute distress.    Appearance: Normal appearance.  HENT:     Head: Normocephalic and atraumatic.     Right Ear: External ear normal.     Left Ear: External ear normal.  Eyes:     General: No scleral icterus.       Right eye: No discharge.        Left eye: No discharge.     Conjunctiva/sclera: Conjunctivae normal.  Neck:     Thyroid: No thyromegaly.  Cardiovascular:     Rate and Rhythm: Normal rate and regular rhythm.  Pulmonary:     Effort: No respiratory distress.     Breath sounds: Normal breath sounds. No wheezing.  Abdominal:     General: Bowel sounds are normal.     Palpations: Abdomen is soft.      Tenderness: There is no abdominal tenderness.  Musculoskeletal:        General: No swelling or tenderness.  Cervical back: Neck supple. No tenderness.  Lymphadenopathy:     Cervical: No cervical adenopathy.  Skin:    Findings: No erythema or rash.  Neurological:     Mental Status: She is alert.  Psychiatric:        Mood and Affect: Mood normal.        Behavior: Behavior normal.     Outpatient Encounter Medications as of 12/03/2020  Medication Sig   sertraline (ZOLOFT) 25 MG tablet Take 1 tablet (25 mg total) by mouth daily.   atorvastatin (LIPITOR) 10 MG tablet One tablet q Monday, Wednesday and Friday.   Azelastine HCl 137 MCG/SPRAY SOLN PLACE 1 SPRAY INTO BOTH NOSTRILS 2 (TWO) TIMES DAILY. USE IN EACH NOSTRIL AS DIRECTED   Biotin 1000 MCG tablet Take 1,000 mcg by mouth 3 (three) times daily. Reported on 05/13/2015   Bromfenac Sodium 0.09 % SOLN Apply to eye.   Calcium Carbonate (CALCIUM 600 PO) Take by mouth daily. Reported on 05/13/2015   Cholecalciferol (VITAMIN D PO) Take by mouth daily. Reported on 05/13/2015   famotidine (PEPCID) 20 MG tablet Take 20 mg by mouth at bedtime.   hydrocortisone 2.5 % cream    omeprazole (PRILOSEC) 20 MG capsule Take 20 mg by mouth daily.   polyethylene glycol (MIRALAX) 17 g packet Take 17 g by mouth daily.   triamcinolone (KENALOG) 0.1 % paste SMARTSIG:Sparingly TO TEETH Twice Daily   vitamin B-12 (CYANOCOBALAMIN) 1000 MCG tablet Take 1,000 mcg by mouth daily. Reported on 05/13/2015   [DISCONTINUED] azithromycin (ZITHROMAX) 250 MG tablet Take 250 mg by mouth as directed.   [DISCONTINUED] Epinastine HCl 0.05 % ophthalmic solution Place 1 drop into both eyes 2 (two) times daily as needed.   [DISCONTINUED] loteprednol (LOTEMAX) 0.5 % ophthalmic suspension 1 drop 3 (three) times daily.   [DISCONTINUED] neomycin-polymyxin-dexamethasone (MAXITROL) 0.1 % ophthalmic suspension Place 1 drop into both eyes 2 (two) times daily.   [DISCONTINUED] Olopatadine  HCl (PAZEO) 0.7 % SOLN Place 1 drop into both eyes daily.   [DISCONTINUED] pantoprazole (PROTONIX) 40 MG tablet Take 40 mg by mouth every morning.   [DISCONTINUED] Polyethyl Glycol-Propyl Glycol 0.4-0.3 % SOLN Apply to eye.   [DISCONTINUED] prednisoLONE acetate (PRED FORTE) 1 % ophthalmic suspension Place 1 drop into the left eye 4 (four) times daily.   [DISCONTINUED] Vibegron 75 MG TABS Take 75 mg by mouth daily.   Facility-Administered Encounter Medications as of 12/03/2020  Medication   betamethasone acetate-betamethasone sodium phosphate (CELESTONE) injection 3 mg   betamethasone acetate-betamethasone sodium phosphate (CELESTONE) injection 3 mg     Lab Results  Component Value Date   WBC 7.3 09/17/2020   HGB 13.4 09/17/2020   HCT 39.5 09/17/2020   PLT 321 09/17/2020   GLUCOSE 88 12/03/2020   CHOL 250 (H) 07/24/2020   TRIG 127.0 07/24/2020   HDL 63.00 07/24/2020   LDLDIRECT 141.0 09/01/2016   LDLCALC 161 (H) 07/24/2020   ALT 14 12/03/2020   AST 19 12/03/2020   NA 141 12/03/2020   K 3.9 12/03/2020   CL 104 12/03/2020   CREATININE 0.96 12/03/2020   BUN 21 12/03/2020   CO2 28 12/03/2020   TSH 3.26 02/12/2020   HGBA1C 5.6 12/03/2020    DG Chest 2 View  Result Date: 09/17/2020 CLINICAL DATA:  Chest pain, unspecified abdominal pain, hypertension EXAM: CHEST - 2 VIEW COMPARISON:  None. FINDINGS: The lungs are mildly, symmetrically hyperinflated in keeping with changes of underlying COPD. No superimposed confluent pulmonary infiltrate. No pneumothorax or  pleural effusion. Cardiac size within normal limits. Pulmonary vascularity is normal. No acute bone abnormality. IMPRESSION: No active cardiopulmonary disease.  COPD. Electronically Signed   By: Fidela Salisbury M.D.   On: 09/17/2020 00:59   CT ABDOMEN PELVIS W CONTRAST  Result Date: 09/17/2020 CLINICAL DATA:  80 year old female with abdominal pain. EXAM: CT ABDOMEN AND PELVIS WITH CONTRAST TECHNIQUE: Multidetector CT imaging of  the abdomen and pelvis was performed using the standard protocol following bolus administration of intravenous contrast. CONTRAST:  47m OMNIPAQUE IOHEXOL 350 MG/ML SOLN COMPARISON:  CT Abdomen and Pelvis 08/24/2018. Abdomen MRI 09/18/2018. FINDINGS: Lower chest: Mild chronic lung base scarring is stable. No cardiomegaly, pericardial or pleural effusion. Moderate size gastric hiatal hernia is stable. Hepatobiliary: Negative liver and gallbladder. Pancreas: Mildly atrophied, otherwise negative. Spleen: Stable and negative with incidental splenule (normal variant). Adrenals/Urinary Tract: Normal adrenal glands. Symmetric renal enhancement and contrast excretion. Extrarenal pelvis suspected on the right (normal variant). No renal mass. Right ureter appears mildly larger than the left in the abdomen, but no ureteral calculus or obstructing etiology is identified. Chronic pelvic phleboliths. Unremarkable bladder. Stomach/Bowel: Negative rectum. Mildly redundant sigmoid colon with extensive diverticulosis along the left pelvic sidewall and in the left lower quadrant. Similar extensive diverticulosis of the descending colon. No active inflammation. Redundant transverse and right colon with diverticulosis most pronounced at the hepatic flexure. No active inflammation. Normal appendix on series 2, image 53. Negative terminal ileum. No dilated small bowel. Aside from the moderate chronic hiatal hernia the stomach appears within normal limits. Duodenum is decompressed. There is a distal duodenal diverticulum near the ligament of Treitz with no active inflammation. No free air or free fluid. No mesenteric stranding identified. Vascular/Lymphatic: Mild for age Aortoiliac calcified atherosclerosis. Normal caliber abdominal aorta. Major arterial structures are patent. Portal venous system is patent. No lymphadenopathy. Reproductive: Surgically absent uterus. Diminutive or absent ovaries. Other: No pelvic free fluid.  Musculoskeletal: No acute osseous abnormality identified. IMPRESSION: 1. No acute or inflammatory process identified in the abdomen or pelvis. 2. Fairly extensive large bowel diverticulosis without active inflammation. Normal appendix. Chronic moderate size gastric hiatal hernia. 3. Aortic Atherosclerosis (ICD10-I70.0). Electronically Signed   By: HGenevie AnnM.D.   On: 09/17/2020 05:18       Assessment & Plan:   Problem List Items Addressed This Visit     Aortic atherosclerosis ([place]    Continue lipitor.       CKD (chronic kidney disease) stage 3, GFR 30-59 ml/min (HCC)    Continue to avoid antiinflammatories.  Stay hydrated.  Follow.  Recheck today.       Dysuria    Lower abdominal pressure and dysuria as outlined.  Check urine to confirm if infection present.        Hypercholesterolemia    Continue lipitor.  Low cholesterol diet and exercise.  Follow lipid panel and liver function tests.        Hyperglycemia    Low carb diet and exercise.  Follow met b and a1c.       Stress    Discussed.  Start zoloft as directed.  Get her back in soon to reassess.       Thyroid nodule    Had follow-up with Dr. OHonor JunesMay 2022.  Thyroid ultrasound stable.  Recommended follow-up 1 year with thyroid ultrasound.      Benign essential HTN (Chronic)    Increased stress.  Discussed blood pressure.  Start zoloft. Follow pressures. Call in readings.  GERD (gastroesophageal reflux disease) (Chronic)    Continue protonix.  Has pepcid.  Follow.        Other Visit Diagnoses     Abdominal pressure    -  Primary   Relevant Orders   Urinalysis, Routine w reflex microscopic (Completed)   Urine Culture (Completed)   Need for immunization against influenza       Relevant Orders   Flu Vaccine QUAD 68moIM (Fluarix, Fluzone & Alfiuria Quad PF) (Completed)        CEinar Pheasant MD

## 2020-12-04 LAB — HEPATIC FUNCTION PANEL
ALT: 14 U/L (ref 0–35)
AST: 19 U/L (ref 0–37)
Albumin: 4.7 g/dL (ref 3.5–5.2)
Alkaline Phosphatase: 110 U/L (ref 39–117)
Bilirubin, Direct: 0.1 mg/dL (ref 0.0–0.3)
Total Bilirubin: 0.5 mg/dL (ref 0.2–1.2)
Total Protein: 7.4 g/dL (ref 6.0–8.3)

## 2020-12-04 LAB — URINALYSIS, ROUTINE W REFLEX MICROSCOPIC
Bilirubin Urine: NEGATIVE
Ketones, ur: NEGATIVE
Nitrite: POSITIVE — AB
Specific Gravity, Urine: >=1.03 — AB (ref 1.000–1.030)
Total Protein, Urine: NEGATIVE
Urine Glucose: NEGATIVE
Urobilinogen, UA: 0.2 (ref 0.0–1.0)
pH: 6 (ref 5.0–8.0)

## 2020-12-04 LAB — BASIC METABOLIC PANEL
BUN: 21 mg/dL (ref 6–23)
CO2: 28 mEq/L (ref 19–32)
Calcium: 9.5 mg/dL (ref 8.4–10.5)
Chloride: 104 mEq/L (ref 96–112)
Creatinine, Ser: 0.96 mg/dL (ref 0.40–1.20)
GFR: 55.47 mL/min — ABNORMAL LOW (ref >60.00)
Glucose, Bld: 88 mg/dL (ref 70–99)
Potassium: 3.9 mEq/L (ref 3.5–5.1)
Sodium: 141 mEq/L (ref 135–145)

## 2020-12-04 LAB — HEMOGLOBIN A1C: Hgb A1c MFr Bld: 5.6 % (ref 4.6–6.5)

## 2020-12-05 LAB — URINE CULTURE
MICRO NUMBER:: 12620964
SPECIMEN QUALITY:: ADEQUATE

## 2020-12-07 ENCOUNTER — Encounter: Payer: Self-pay | Admitting: Internal Medicine

## 2020-12-07 ENCOUNTER — Telehealth: Payer: Self-pay | Admitting: Internal Medicine

## 2020-12-07 DIAGNOSIS — R300 Dysuria: Secondary | ICD-10-CM | POA: Insufficient documentation

## 2020-12-07 MED ORDER — CEFDINIR 300 MG PO CAPS: 300.0000 mg | ORAL_CAPSULE | Freq: Two times a day (BID) | ORAL | 0 refills | Status: DC

## 2020-12-07 NOTE — Telephone Encounter (Signed)
Called patient advised of results and patient stated no allergy to ABX, and medication ordered as verbalized in result note.

## 2020-12-07 NOTE — Telephone Encounter (Signed)
Patient aware.

## 2020-12-07 NOTE — Assessment & Plan Note (Signed)
Lower abdominal pressure and dysuria as outlined.  Check urine to confirm if infection present.

## 2020-12-07 NOTE — Telephone Encounter (Signed)
Pt called in stating that she received a voicemail over the weekend about Pt calling the office for results. Pt called in requesting for result of urinalysis. Pt stated that she is still having severe pain. Pt would like call back.

## 2020-12-07 NOTE — Assessment & Plan Note (Signed)
Continue protonix.  Has pepcid.  Follow.

## 2020-12-07 NOTE — Assessment & Plan Note (Signed)
Discussed.  Start zoloft as directed.  Get her back in soon to reassess.

## 2020-12-07 NOTE — Assessment & Plan Note (Signed)
Continue to avoid antiinflammatories.  Stay hydrated.  Follow.  Recheck today.  

## 2020-12-07 NOTE — Assessment & Plan Note (Signed)
Increased stress.  Discussed blood pressure.  Start zoloft. Follow pressures. Call in readings.

## 2020-12-07 NOTE — Assessment & Plan Note (Signed)
Continue lipitor.  Low cholesterol diet and exercise.  Follow lipid panel and liver function tests.   

## 2020-12-07 NOTE — Assessment & Plan Note (Signed)
Low carb diet and exercise.  Follow met b and a1c.  

## 2020-12-07 NOTE — Assessment & Plan Note (Signed)
Continue lipitor  ?

## 2020-12-07 NOTE — Assessment & Plan Note (Signed)
Had follow-up with Dr. O'Connell May 2022.  Thyroid ultrasound stable.  Recommended follow-up 1 year with thyroid ultrasound. °

## 2020-12-07 NOTE — Telephone Encounter (Signed)
Thank you.  If pain does not resolve or any problems, she will need to let us know or be reevaluated.  Also any worsening pain, needs to be evaluated.

## 2021-01-07 ENCOUNTER — Ambulatory Visit (INDEPENDENT_AMBULATORY_CARE_PROVIDER_SITE_OTHER): Payer: Medicare Other | Admitting: Internal Medicine

## 2021-01-07 ENCOUNTER — Other Ambulatory Visit: Payer: Self-pay

## 2021-01-07 VITALS — BP 138/84 | HR 70 | Temp 97.8°F | Resp 16 | Ht 64.0 in | Wt 135.0 lb

## 2021-01-07 DIAGNOSIS — I10 Essential (primary) hypertension: Secondary | ICD-10-CM | POA: Diagnosis not present

## 2021-01-07 DIAGNOSIS — E7800 Pure hypercholesterolemia, unspecified: Secondary | ICD-10-CM | POA: Diagnosis not present

## 2021-01-07 DIAGNOSIS — I700 Atherosclerosis of aorta: Secondary | ICD-10-CM

## 2021-01-07 DIAGNOSIS — E041 Nontoxic single thyroid nodule: Secondary | ICD-10-CM

## 2021-01-07 DIAGNOSIS — N1831 Chronic kidney disease, stage 3a: Secondary | ICD-10-CM

## 2021-01-07 DIAGNOSIS — K219 Gastro-esophageal reflux disease without esophagitis: Secondary | ICD-10-CM

## 2021-01-07 DIAGNOSIS — F439 Reaction to severe stress, unspecified: Secondary | ICD-10-CM

## 2021-01-07 DIAGNOSIS — R739 Hyperglycemia, unspecified: Secondary | ICD-10-CM

## 2021-01-07 DIAGNOSIS — N3941 Urge incontinence: Secondary | ICD-10-CM

## 2021-01-07 NOTE — Progress Notes (Signed)
Patient ID: Carrie Noble, female   DOB: 01/16/1941, 80 y.o.   MRN: [id number]   Subjective:    Patient ID: Carrie Noble, female    DOB: August 09, 1940, 80 y.o.   MRN: [id number]  This visit occurred during the SARS-CoV-2 public health emergency.  Safety protocols were in place, including screening questions prior to the visit, additional usage of staff PPE, and extensive cleaning of exam room while observing appropriate contact time as indicated for disinfecting solutions.   Patient here for a scheduled follow up .   HPI Here to follow up regarding her blood pressure, cholesterol and increased stress.  Recently treated for UTI.  Took probiotics.  States this helped her GI issues.  Continuing on probiotics.  Tries to stay active.  No chest pain or sob reported.  No abdominal pain.  Does report persistent increased urinary urgency and frequency.  No infectious symptoms.  Has seen urology previously.  Medication - made her too dry.  Discussed referral back to urology.     Past Medical History:  Diagnosis Date   Allergy    Arthritis    Gastroesophageal reflux    Hiatal hernia 1989   status post Nissen fundoplication    Hx of hysterectomy    Hyperlipidemia    Tachycardia    Patient stated that this has been occuring frequently.   Thyroid disease    Goiter   Past Surgical History:  Procedure Laterality Date   ABDOMINAL HYSTERECTOMY  1980   partial   BREAST BIOPSY Right    neg   BREAST EXCISIONAL BIOPSY Left 2014   neg   COLONOSCOPY  2015   ESOPHAGOGASTRIC FUNDOPLICATION  7253   LAPAROSCOPIC NISSEN FUNDOPLICATION  6644   TONSILLECTOMY     as well as goiter   UPPER GI ENDOSCOPY  2015   Family History  Problem Relation Age of Onset   Stroke Mother 59   Arthritis Mother    Heart disease Mother    Hypertension Mother    Stroke Father 73   Hypertension Father    Rheumatic fever Brother        and multiple open heart surgeries   Diabetes Brother        type 2   Stroke Brother     Other Sister        Coronary Atherosclerosis   Stroke Brother    Stroke Sister    Heart disease Sister    Dementia Sister    Cancer Sister        ovarian   Breast cancer Neg Hx    Social History   Socioeconomic History   Marital status: Married    Spouse name: Not on file   Number of children: Not on file   Years of education: Not on file   Highest education level: Not on file  Occupational History   Occupation: Retired    Fish farm manager: OTHER  Tobacco Use   Smoking status: Never   Smokeless tobacco: Never  Substance and Sexual Activity   Alcohol use: No    Alcohol/week: 0.0 standard drinks   Drug use: No   Sexual activity: Never  Other Topics Concern   Not on file  Social History Narrative   Occasionally drinks coffee.   Social Determinants of Health   Financial Resource Strain: Low Risk    Difficulty of Paying Living Expenses: Not hard at all  Food Insecurity: No Food Insecurity   Worried About Charity fundraiser in the  Last Year: Never true   Ran Out of Food in the Last Year: Never true  Transportation Needs: No Transportation Needs   Lack of Transportation (Medical): No   Lack of Transportation (Non-Medical): No  Physical Activity: Insufficiently Active   Days of Exercise per Week: 7 days   Minutes of Exercise per Session: 20 min  Stress: No Stress Concern Present   Feeling of Stress : Not at all  Social Connections: Unknown   Frequency of Communication with Friends and Family: Not on file   Frequency of Social Gatherings with Friends and Family: Not on file   Attends Religious Services: Not on file   Active Member of Clubs or Organizations: Not on file   Attends Archivist Meetings: Not on file   Marital Status: Married     Review of Systems  Constitutional:  Negative for appetite change and unexpected weight change.  HENT:  Negative for congestion and sinus pressure.   Respiratory:  Negative for cough, chest tightness and shortness of breath.    Cardiovascular:  Negative for chest pain, palpitations and leg swelling.  Gastrointestinal:  Negative for abdominal pain, diarrhea, nausea and vomiting.  Genitourinary:  Positive for frequency and urgency. Negative for difficulty urinating.  Musculoskeletal:  Negative for joint swelling and myalgias.  Skin:  Negative for color change and rash.  Neurological:  Negative for dizziness, light-headedness and headaches.  Psychiatric/Behavioral:  Negative for agitation and dysphoric mood.       Objective:     BP 138/84    Pulse 70    Temp 97.8 F (36.6 C)    Resp 16    Ht 5' 4"  (1.626 m)    Wt 135 lb (61.2 kg)    SpO2 98%    BMI 23.17 kg/m  Wt Readings from Last 3 Encounters:  01/07/21 135 lb (61.2 kg)  12/03/20 133 lb 12.8 oz (60.7 kg)  09/18/20 134 lb 12.8 oz (61.1 kg)    Physical Exam Vitals reviewed.  Constitutional:      General: She is not in acute distress.    Appearance: Normal appearance.  HENT:     Head: Normocephalic and atraumatic.     Right Ear: External ear normal.     Left Ear: External ear normal.  Eyes:     General: No scleral icterus.       Right eye: No discharge.        Left eye: No discharge.     Conjunctiva/sclera: Conjunctivae normal.  Neck:     Thyroid: No thyromegaly.  Cardiovascular:     Rate and Rhythm: Normal rate and regular rhythm.  Pulmonary:     Effort: No respiratory distress.     Breath sounds: Normal breath sounds. No wheezing.  Abdominal:     General: Bowel sounds are normal.     Palpations: Abdomen is soft.     Tenderness: There is no abdominal tenderness.  Musculoskeletal:        General: No swelling or tenderness.     Cervical back: Neck supple. No tenderness.  Lymphadenopathy:     Cervical: No cervical adenopathy.  Skin:    Findings: No erythema or rash.  Neurological:     Mental Status: She is alert.  Psychiatric:        Mood and Affect: Mood normal.        Behavior: Behavior normal.     Outpatient Encounter Medications  as of 01/07/2021  Medication Sig   atorvastatin (LIPITOR) 10  MG tablet One tablet q Monday, Wednesday and Friday.   Azelastine HCl 137 MCG/SPRAY SOLN PLACE 1 SPRAY INTO BOTH NOSTRILS 2 (TWO) TIMES DAILY. USE IN EACH NOSTRIL AS DIRECTED   Biotin 1000 MCG tablet Take 1,000 mcg by mouth 3 (three) times daily. Reported on 05/13/2015   Bromfenac Sodium 0.09 % SOLN Apply to eye.   Calcium Carbonate (CALCIUM 600 PO) Take by mouth daily. Reported on 05/13/2015   cefdinir (OMNICEF) 300 MG capsule Take 1 capsule (300 mg total) by mouth 2 (two) times daily.   Cholecalciferol (VITAMIN D PO) Take by mouth daily. Reported on 05/13/2015   famotidine (PEPCID) 20 MG tablet Take 20 mg by mouth at bedtime.   hydrocortisone 2.5 % cream    omeprazole (PRILOSEC) 20 MG capsule Take 20 mg by mouth daily.   polyethylene glycol (MIRALAX) 17 g packet Take 17 g by mouth daily.   sertraline (ZOLOFT) 25 MG tablet Take 1 tablet (25 mg total) by mouth daily.   triamcinolone (KENALOG) 0.1 % paste SMARTSIG:Sparingly TO TEETH Twice Daily   vitamin B-12 (CYANOCOBALAMIN) 1000 MCG tablet Take 1,000 mcg by mouth daily. Reported on 05/13/2015   Facility-Administered Encounter Medications as of 01/07/2021  Medication   betamethasone acetate-betamethasone sodium phosphate (CELESTONE) injection 3 mg   betamethasone acetate-betamethasone sodium phosphate (CELESTONE) injection 3 mg     Lab Results  Component Value Date   WBC 7.3 09/17/2020   HGB 13.4 09/17/2020   HCT 39.5 09/17/2020   PLT 321 09/17/2020   GLUCOSE 88 12/03/2020   CHOL 250 (H) 07/24/2020   TRIG 127.0 07/24/2020   HDL 63.00 07/24/2020   LDLDIRECT 141.0 09/01/2016   LDLCALC 161 (H) 07/24/2020   ALT 14 12/03/2020   AST 19 12/03/2020   NA 141 12/03/2020   K 3.9 12/03/2020   CL 104 12/03/2020   CREATININE 0.96 12/03/2020   BUN 21 12/03/2020   CO2 28 12/03/2020   TSH 3.26 02/12/2020   HGBA1C 5.6 12/03/2020    DG Chest 2 View  Result Date:  09/17/2020 CLINICAL DATA:  Chest pain, unspecified abdominal pain, hypertension EXAM: CHEST - 2 VIEW COMPARISON:  None. FINDINGS: The lungs are mildly, symmetrically hyperinflated in keeping with changes of underlying COPD. No superimposed confluent pulmonary infiltrate. No pneumothorax or pleural effusion. Cardiac size within normal limits. Pulmonary vascularity is normal. No acute bone abnormality. IMPRESSION: No active cardiopulmonary disease.  COPD. Electronically Signed   By: Fidela Salisbury M.D.   On: 09/17/2020 00:59   CT ABDOMEN PELVIS W CONTRAST  Result Date: 09/17/2020 CLINICAL DATA:  80 year old female with abdominal pain. EXAM: CT ABDOMEN AND PELVIS WITH CONTRAST TECHNIQUE: Multidetector CT imaging of the abdomen and pelvis was performed using the standard protocol following bolus administration of intravenous contrast. CONTRAST:  62m OMNIPAQUE IOHEXOL 350 MG/ML SOLN COMPARISON:  CT Abdomen and Pelvis 08/24/2018. Abdomen MRI 09/18/2018. FINDINGS: Lower chest: Mild chronic lung base scarring is stable. No cardiomegaly, pericardial or pleural effusion. Moderate size gastric hiatal hernia is stable. Hepatobiliary: Negative liver and gallbladder. Pancreas: Mildly atrophied, otherwise negative. Spleen: Stable and negative with incidental splenule (normal variant). Adrenals/Urinary Tract: Normal adrenal glands. Symmetric renal enhancement and contrast excretion. Extrarenal pelvis suspected on the right (normal variant). No renal mass. Right ureter appears mildly larger than the left in the abdomen, but no ureteral calculus or obstructing etiology is identified. Chronic pelvic phleboliths. Unremarkable bladder. Stomach/Bowel: Negative rectum. Mildly redundant sigmoid colon with extensive diverticulosis along the left pelvic sidewall and in the  left lower quadrant. Similar extensive diverticulosis of the descending colon. No active inflammation. Redundant transverse and right colon with diverticulosis most  pronounced at the hepatic flexure. No active inflammation. Normal appendix on series 2, image 53. Negative terminal ileum. No dilated small bowel. Aside from the moderate chronic hiatal hernia the stomach appears within normal limits. Duodenum is decompressed. There is a distal duodenal diverticulum near the ligament of Treitz with no active inflammation. No free air or free fluid. No mesenteric stranding identified. Vascular/Lymphatic: Mild for age Aortoiliac calcified atherosclerosis. Normal caliber abdominal aorta. Major arterial structures are patent. Portal venous system is patent. No lymphadenopathy. Reproductive: Surgically absent uterus. Diminutive or absent ovaries. Other: No pelvic free fluid. Musculoskeletal: No acute osseous abnormality identified. IMPRESSION: 1. No acute or inflammatory process identified in the abdomen or pelvis. 2. Fairly extensive large bowel diverticulosis without active inflammation. Normal appendix. Chronic moderate size gastric hiatal hernia. 3. Aortic Atherosclerosis (ICD10-I70.0). Electronically Signed   By: Genevie Ann M.D.   On: 09/17/2020 05:18       Assessment & Plan:   Problem List Items Addressed This Visit     Aortic atherosclerosis ([place])    Continue lipitor.       CKD (chronic kidney disease) stage 3, GFR 30-59 ml/min (HCC)    Continue to avoid antiinflammatories.  Stay hydrated.  Follow.  Follow metabolic panel.       Hypercholesterolemia    Continue lipitor.  Low cholesterol diet and exercise.  Follow lipid panel and liver function tests.        Relevant Orders   Hepatic function panel   Lipid panel   TSH   Hyperglycemia    Low carb diet and exercise.  Follow met b and a1c.       Relevant Orders   Hemoglobin A1c   Stress    Continue zoloft.  Follow.        Thyroid nodule    Had follow-up with Dr. Honor Junes May 2022.  Thyroid ultrasound stable.  Recommended follow-up 1 year with thyroid ultrasound.      Urge incontinence    Has seen  gyn and urology.  Has tried different medications - dries her out.  Has tried myrbetriq.  Discussed referral back to urology.        Relevant Orders   Ambulatory referral to Urology   Benign essential HTN - Primary (Chronic)    Blood pressure ok.  Hold on medication.  Follow pressures.  Follow metabolic panel.       Relevant Orders   Varicella zoster antibody, IgG (Completed)   Basic metabolic panel   GERD (gastroesophageal reflux disease) (Chronic)    Continue protonix.  Follow.          Einar Pheasant, MD

## 2021-01-08 LAB — VARICELLA ZOSTER ANTIBODY, IGG: Varicella IgG: 395.3 index

## 2021-01-10 ENCOUNTER — Encounter: Payer: Self-pay | Admitting: Internal Medicine

## 2021-01-10 NOTE — Assessment & Plan Note (Signed)
Continue protonix.  Follow.   

## 2021-01-10 NOTE — Assessment & Plan Note (Signed)
Continue lipitor  ?

## 2021-01-10 NOTE — Assessment & Plan Note (Signed)
Has seen gyn and urology.  Has tried different medications - dries her out.  Has tried myrbetriq.  Discussed referral back to urology.

## 2021-01-10 NOTE — Assessment & Plan Note (Signed)
Had follow-up with Dr. Gershon Crane May 2022.  Thyroid ultrasound stable.  Recommended follow-up 1 year with thyroid ultrasound.

## 2021-01-10 NOTE — Assessment & Plan Note (Signed)
Low carb diet and exercise.  Follow met b and a1c.

## 2021-01-10 NOTE — Assessment & Plan Note (Signed)
Blood pressure ok.  Hold on medication.  Follow pressures.  Follow metabolic panel.

## 2021-01-10 NOTE — Assessment & Plan Note (Signed)
Continue to avoid antiinflammatories.  Stay hydrated.  Follow.  Follow metabolic panel.  

## 2021-01-10 NOTE — Assessment & Plan Note (Signed)
Continue lipitor.  Low cholesterol diet and exercise.  Follow lipid panel and liver function tests.   

## 2021-01-10 NOTE — Assessment & Plan Note (Signed)
Continue zoloft.  Follow.   

## 2021-01-29 ENCOUNTER — Ambulatory Visit: Payer: Medicare Other | Admitting: Internal Medicine

## 2021-02-16 ENCOUNTER — Telehealth: Payer: Self-pay | Admitting: Internal Medicine

## 2021-02-16 DIAGNOSIS — R300 Dysuria: Secondary | ICD-10-CM

## 2021-02-16 NOTE — Telephone Encounter (Signed)
LMTCB

## 2021-02-16 NOTE — Telephone Encounter (Signed)
Patient  called in  about Bladder issues. Patient  is  requesting a refill, Patient does not know the name of medication that helped last time, I reached out to [place] and she stated she would call patient back because she may need to see the patient before refill can be sent. I made patient aware of what Larena Glassman stated, Please call patient back at [phone]

## 2021-02-18 NOTE — Telephone Encounter (Signed)
Pt returning call. Pt requesting callback.  °

## 2021-02-18 NOTE — Telephone Encounter (Signed)
Spoke with patient. Having burning, urgency and frequency. Using azo and vagi-cain. Offered appt. Patient declined and stated her insurance nurse is coming tomorrow. If visit is needed she will call back

## 2021-02-19 ENCOUNTER — Other Ambulatory Visit: Payer: Self-pay

## 2021-02-19 ENCOUNTER — Other Ambulatory Visit: Payer: Medicare Other

## 2021-02-19 DIAGNOSIS — R300 Dysuria: Secondary | ICD-10-CM

## 2021-02-19 NOTE — Telephone Encounter (Signed)
Patient having dysuria says better to day not hurting but afraid it will get bad wanted to bring in urine advised okay to do urine set phone visit for monday and advised symptoms worsen needs to be seen sooner at [facility].

## 2021-02-20 LAB — URINALYSIS
Bilirubin, UA: NEGATIVE
Glucose, UA: NEGATIVE
Ketones, UA: NEGATIVE
Leukocytes,UA: NEGATIVE
Nitrite, UA: NEGATIVE
Protein,UA: NEGATIVE
Specific Gravity, UA: 1.028 (ref 1.005–1.030)
Urobilinogen, Ur: 1 mg/dL (ref 0.2–1.0)
pH, UA: 5.5 (ref 5.0–7.5)

## 2021-02-21 LAB — URINE CULTURE

## 2021-02-22 ENCOUNTER — Ambulatory Visit (INDEPENDENT_AMBULATORY_CARE_PROVIDER_SITE_OTHER): Payer: Medicare Other | Admitting: Internal Medicine

## 2021-02-22 ENCOUNTER — Other Ambulatory Visit: Payer: Self-pay

## 2021-02-22 DIAGNOSIS — R300 Dysuria: Secondary | ICD-10-CM

## 2021-02-22 DIAGNOSIS — K219 Gastro-esophageal reflux disease without esophagitis: Secondary | ICD-10-CM

## 2021-02-22 NOTE — Progress Notes (Signed)
Patient ID: Carrie Noble, female   DOB: 31-Oct-1940, 81 y.o.   MRN: [id number]   Virtual Visit via telephone Note  This visit type was conducted due to national recommendations for restrictions regarding the COVID-19 pandemic (e.g. social distancing).  This format is felt to be most appropriate for this patient at this time.  All issues noted in this document were discussed and addressed.  No physical exam was performed (except for noted visual exam findings with Video Visits).   I connected with Carrie Noble by telephone and verified that I am speaking with the correct person using two identifiers. Location patient: home Location provider: work Persons participating in the telephone visit: patient, provider  The limitations, risks, security and privacy concerns of performing an evaluation and management service by telephone and the availability of in person appointments have been discussed.  It has also been discussed with the patient that there may be a patient responsible charge related to this service. The patient expressed understanding and agreed to proceed.   Reason for visit: work in appt  HPI: Work in with concerns regarding UTI.  States symptoms started last week.  6 days ago, noticed burning with urination.  Increased frequency.  Some better today.  No hematuria reported.  Feels probiotics help.  No symptoms today. Has f/u with urology next week.  States she is sleeping in a chair - to help with acid reflux.  Taking protonix bid.  Still notices acid reflux.  Decreased appetite.  No vomiting.  Previous CT - hiatal hernia.  Discussed f/u with GI.  Agreeable.     ROS: See pertinent positives and negatives per HPI.  Past Medical History:  Diagnosis Date   Allergy    Arthritis    Gastroesophageal reflux    Hiatal hernia 1989   status post Nissen fundoplication    Hx of hysterectomy    Hyperlipidemia    Tachycardia    Patient stated that this has been occuring frequently.    Thyroid disease    Goiter    Past Surgical History:  Procedure Laterality Date   ABDOMINAL HYSTERECTOMY  1980   partial   BREAST BIOPSY Right    neg   BREAST EXCISIONAL BIOPSY Left 2014   neg   COLONOSCOPY  2015   ESOPHAGOGASTRIC FUNDOPLICATION  1999   LAPAROSCOPIC NISSEN FUNDOPLICATION  1999   TONSILLECTOMY     as well as goiter   UPPER GI ENDOSCOPY  2015    Family History  Problem Relation Age of Onset   Stroke Mother 26   Arthritis Mother    Heart disease Mother    Hypertension Mother    Stroke Father 58   Hypertension Father    Rheumatic fever Brother        and multiple open heart surgeries   Diabetes Brother        type 2   Stroke Brother    Other Sister        Coronary Atherosclerosis   Stroke Brother    Stroke Sister    Heart disease Sister    Dementia Sister    Cancer Sister        ovarian   Breast cancer Neg Hx     SOCIAL HX: reviewed.    Current Outpatient Medications:    atorvastatin (LIPITOR) 10 MG tablet, One tablet q Monday, Wednesday and Friday., Disp: 40 tablet, Rfl: 1   Azelastine HCl 137 MCG/SPRAY SOLN, PLACE 1 SPRAY INTO BOTH NOSTRILS 2 (TWO)  TIMES DAILY. USE IN EACH NOSTRIL AS DIRECTED, Disp: 30 mL, Rfl: 1   Biotin 1000 MCG tablet, Take 1,000 mcg by mouth 3 (three) times daily. Reported on 05/13/2015, Disp: , Rfl:    Bromfenac Sodium 0.09 % SOLN, Apply to eye., Disp: , Rfl:    Calcium Carbonate (CALCIUM 600 PO), Take by mouth daily. Reported on 05/13/2015, Disp: , Rfl:    Cholecalciferol (VITAMIN D PO), Take by mouth daily. Reported on 05/13/2015, Disp: , Rfl:    famotidine (PEPCID) 20 MG tablet, Take 20 mg by mouth at bedtime., Disp: , Rfl:    hydrocortisone 2.5 % cream, , Disp: , Rfl:    omeprazole (PRILOSEC) 20 MG capsule, Take 20 mg by mouth daily., Disp: , Rfl:    polyethylene glycol (MIRALAX) 17 g packet, Take 17 g by mouth daily., Disp: 14 each, Rfl: 0   sertraline (ZOLOFT) 25 MG tablet, Take 1 tablet (25 mg total) by mouth daily.,  Disp: 30 tablet, Rfl: 1   triamcinolone (KENALOG) 0.1 % paste, SMARTSIG:Sparingly TO TEETH Twice Daily, Disp: , Rfl:    vitamin B-12 (CYANOCOBALAMIN) 1000 MCG tablet, Take 1,000 mcg by mouth daily. Reported on 05/13/2015, Disp: , Rfl:   Current Facility-Administered Medications:    betamethasone acetate-betamethasone sodium phosphate (CELESTONE) injection 3 mg, 3 mg, Intramuscular, Once, Evans, Brent M, DPM   betamethasone acetate-betamethasone sodium phosphate (CELESTONE) injection 3 mg, 3 mg, Intramuscular, Once, Evans, Dorathy Daft, DPM  EXAM:  GENERAL: alert. Sounds to be in no acute distress.  Answering question appropriately.   PSYCH/NEURO: pleasant and cooperative, no obvious depression or anxiety, speech and thought processing grossly intact  ASSESSMENT AND PLAN:  Discussed the following assessment and plan:  Problem List Items Addressed This Visit     Dysuria    Recent dysuria.  Urine culture negative.  No symptoms today.  Has f/u with urology next week.  Follow.  Hold medication.       GERD (gastroesophageal reflux disease) (Chronic)    Persistent acid reflux despite bid PPI.  Discussed not eating late.  She is sleeping in chair.  Refer back to GI given persistent symptoms despite PPI bid.  Has hiatal hernia.         Return if symptoms worsen or fail to improve, for keep scheduled. .   I discussed the assessment and treatment plan with the patient. The patient was provided an opportunity to ask questions and all were answered. The patient agreed with the plan and demonstrated an understanding of the instructions.   The patient was advised to call back or seek an in-person evaluation if the symptoms worsen or if the condition fails to improve as anticipated.  I provided 20 minutes of non-face-to-face time during this encounter.   Einar Pheasant, MD

## 2021-03-01 ENCOUNTER — Ambulatory Visit: Payer: Medicare Other | Admitting: Urology

## 2021-03-06 ENCOUNTER — Telehealth: Payer: Self-pay | Admitting: Internal Medicine

## 2021-03-06 ENCOUNTER — Encounter: Payer: Self-pay | Admitting: Internal Medicine

## 2021-03-06 NOTE — Assessment & Plan Note (Signed)
Persistent acid reflux despite bid PPI.  Discussed not eating late.  She is sleeping in chair.  Refer back to GI given persistent symptoms despite PPI bid.  Has hiatal hernia.

## 2021-03-06 NOTE — Telephone Encounter (Signed)
Has been seeing Christiane London - [facility].  Needs f/u appt.  Having persistent acid reflux despite bid PPI.

## 2021-03-06 NOTE — Assessment & Plan Note (Signed)
Recent dysuria.  Urine culture negative.  No symptoms today.  Has f/u with urology next week.  Follow.  Hold medication.

## 2021-03-08 NOTE — Telephone Encounter (Signed)
Patient scheduled and aware of appt date and time.

## 2021-03-08 NOTE — Addendum Note (Signed)
Addended by: Charm Barges on: 03/08/2021 01:37 PM   Modules accepted: Level of Service

## 2021-03-31 ENCOUNTER — Other Ambulatory Visit: Payer: Self-pay | Admitting: Internal Medicine

## 2021-03-31 DIAGNOSIS — Z1231 Encounter for screening mammogram for malignant neoplasm of breast: Secondary | ICD-10-CM

## 2021-04-08 ENCOUNTER — Other Ambulatory Visit: Payer: Self-pay | Admitting: Gastroenterology

## 2021-04-08 ENCOUNTER — Other Ambulatory Visit (HOSPITAL_COMMUNITY): Payer: Self-pay | Admitting: Gastroenterology

## 2021-04-13 ENCOUNTER — Ambulatory Visit (INDEPENDENT_AMBULATORY_CARE_PROVIDER_SITE_OTHER): Payer: Medicare Other | Admitting: Adult Health

## 2021-04-13 ENCOUNTER — Encounter: Payer: Self-pay | Admitting: Adult Health

## 2021-04-13 ENCOUNTER — Ambulatory Visit (INDEPENDENT_AMBULATORY_CARE_PROVIDER_SITE_OTHER): Payer: Medicare Other

## 2021-04-13 ENCOUNTER — Other Ambulatory Visit: Payer: Self-pay

## 2021-04-13 VITALS — BP 132/86 | HR 78 | Temp 98.0°F | Ht 64.0 in | Wt 133.0 lb

## 2021-04-13 DIAGNOSIS — R051 Acute cough: Secondary | ICD-10-CM

## 2021-04-13 DIAGNOSIS — J302 Other seasonal allergic rhinitis: Secondary | ICD-10-CM

## 2021-04-13 DIAGNOSIS — R059 Cough, unspecified: Secondary | ICD-10-CM | POA: Insufficient documentation

## 2021-04-13 DIAGNOSIS — Z87898 Personal history of other specified conditions: Secondary | ICD-10-CM

## 2021-04-13 LAB — CBC WITH DIFFERENTIAL/PLATELET
Basophils Absolute: 0.1 10*3/uL (ref 0.0–0.1)
Basophils Relative: 1.2 % (ref 0.0–3.0)
Eosinophils Absolute: 0.2 10*3/uL (ref 0.0–0.7)
Eosinophils Relative: 4.4 % (ref 0.0–5.0)
HCT: 39.6 % (ref 36.0–46.0)
Hemoglobin: 13.4 g/dL (ref 12.0–15.0)
Lymphocytes Relative: 32.4 % (ref 12.0–46.0)
Lymphs Abs: 1.9 10*3/uL (ref 0.7–4.0)
MCHC: 33.9 g/dL (ref 30.0–36.0)
MCV: 93.5 fl (ref 78.0–100.0)
Monocytes Absolute: 0.4 10*3/uL (ref 0.1–1.0)
Monocytes Relative: 6.9 % (ref 3.0–12.0)
Neutro Abs: 3.2 10*3/uL (ref 1.4–7.7)
Neutrophils Relative %: 55.1 % (ref 43.0–77.0)
Platelets: 422 10*3/uL — ABNORMAL HIGH (ref 150.0–400.0)
RBC: 4.24 Mil/uL (ref 3.87–5.11)
RDW: 13.6 % (ref 11.5–15.5)
WBC: 5.7 10*3/uL (ref 4.0–10.5)

## 2021-04-13 LAB — COMPREHENSIVE METABOLIC PANEL
ALT: 17 U/L (ref 0–35)
AST: 19 U/L (ref 0–37)
Albumin: 4.5 g/dL (ref 3.5–5.2)
Alkaline Phosphatase: 114 U/L (ref 39–117)
BUN: 17 mg/dL (ref 6–23)
CO2: 27 mEq/L (ref 19–32)
Calcium: 9.6 mg/dL (ref 8.4–10.5)
Chloride: 102 mEq/L (ref 96–112)
Creatinine, Ser: 1.01 mg/dL (ref 0.40–1.20)
GFR: 52.06 mL/min — ABNORMAL LOW (ref >60.00)
Glucose, Bld: 96 mg/dL (ref 70–99)
Potassium: 4.3 mEq/L (ref 3.5–5.1)
Sodium: 138 mEq/L (ref 135–145)
Total Bilirubin: 0.7 mg/dL (ref 0.2–1.2)
Total Protein: 7.2 g/dL (ref 6.0–8.3)

## 2021-04-13 LAB — TSH: TSH: 4.08 u[IU]/mL (ref 0.35–5.50)

## 2021-04-13 MED ORDER — CLARITIN 5 MG PO CHEW: 5.0000 mg | CHEWABLE_TABLET | Freq: Every day | ORAL | 2 refills | Status: DC

## 2021-04-13 MED ORDER — AZELASTINE HCL 137 MCG/SPRAY NA SOLN: NASAL | 1 refills | Status: DC

## 2021-04-13 NOTE — Progress Notes (Signed)
Acute Office Visit  Subjective:    Patient ID: Carrie Noble, female    DOB: May 05, 1940, 81 y.o.   MRN: [id number]  Chief Complaint  Patient presents with   Follow-up    Pt reports cough. Was having pre-surgical exam for Blepharoplasty. Was told they heard wheezing in her chest. Pt does report having a cough sometimes, mostly at night. But other than that feels fine.    HPI Patient is in today for  was having pre- surgical exam, is having Blepharoplasty surgery not scheduled yet. 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   She reports she has sneezing spells and coughing. She has GERD when laying down. She denies any orthopnea. Mouth is dry.  No excessive runny nose. She use to have bad allergies outdoor she reports. She had covid last year.  This cough has been intermittent for 2- 3 months per patient report, occasionally had yellow green nasal discharge.  She took zyrtec once the other night does not like to take as they dry her out. She is not using azelastine.   Incidentally she reports that her entire family is sick with congestion and diarrhea nausea vomiting her husband has had this as well.  She denies any nausea or vomiting symptoms she does have runny nose and cough.  She denies these being any new symptoms for her.  She denies any chest pain.  She felt like she was taken Zyrtec x1 dose and it dried her out so she stopped it.  Dr. Lanier Clam cardiology   She reports they heard wheezing on exam.  Seeing [facility] having a work up for GERD- she has to have reflux under control prior to surgery. She has some further work up with GI.  She has been taking pantoprazole twice daily most days but does not feel that it is working.  She was instructed by GI of how to take her pantoprazole particularly in the p.m. for maximum benefit.  Recommended melatonin versus Benadryl due to anticholinergic effect.  She is scheduled for an EGD   Patient  denies any fever,  body aches,chills, rash, chest pain, shortness of breath, nausea, vomiting, or diarrhea.  Denies dizziness, lightheadedness, pre syncopal or syncopal episodes.   Past Medical History:  Diagnosis Date   Allergy    Arthritis    Gastroesophageal reflux    Hiatal hernia 1989   status post Nissen fundoplication    Hx of hysterectomy    Hyperlipidemia    Tachycardia    Patient stated that this has been occuring frequently.   Thyroid disease    Goiter    Past Surgical History:  Procedure Laterality Date   ABDOMINAL HYSTERECTOMY  1980   partial   BREAST BIOPSY Right    neg   BREAST EXCISIONAL BIOPSY Left 2014   neg   COLONOSCOPY  2015   ESOPHAGOGASTRIC FUNDOPLICATION  1999   LAPAROSCOPIC NISSEN FUNDOPLICATION  1999   TONSILLECTOMY     as well as goiter   UPPER GI ENDOSCOPY  2015    Family History  Problem Relation Age of Onset   Stroke Mother 87   Arthritis Mother    Heart disease Mother    Hypertension Mother    Stroke Father 48   Hypertension Father    Rheumatic fever Brother        and multiple open heart surgeries   Diabetes Brother        type 2   Stroke Brother  Other Sister        Coronary Atherosclerosis   Stroke Brother    Stroke Sister    Heart disease Sister    Dementia Sister    Cancer Sister        ovarian   Breast cancer Neg Hx     Social History   Socioeconomic History   Marital status: Married    Spouse name: Not on file   Number of children: Not on file   Years of education: Not on file   Highest education level: Not on file  Occupational History   Occupation: Retired    Associate Professor: OTHER  Tobacco Use   Smoking status: Never   Smokeless tobacco: Never  Substance and Sexual Activity   Alcohol use: No    Alcohol/week: 0.0 standard drinks   Drug use: No   Sexual activity: Never  Other Topics Concern   Not on file  Social History Narrative   Occasionally drinks coffee.   Social Determinants of Health   Financial Resource Strain:  Low Risk    Difficulty of Paying Living Expenses: Not hard at all  Food Insecurity: No Food Insecurity   Worried About Programme researcher, broadcasting/film/video in the Last Year: Never true   Ran Out of Food in the Last Year: Never true  Transportation Needs: No Transportation Needs   Lack of Transportation (Medical): No   Lack of Transportation (Non-Medical): No  Physical Activity: Insufficiently Active   Days of Exercise per Week: 7 days   Minutes of Exercise per Session: 20 min  Stress: No Stress Concern Present   Feeling of Stress : Not at all  Social Connections: Unknown   Frequency of Communication with Friends and Family: Not on file   Frequency of Social Gatherings with Friends and Family: Not on file   Attends Religious Services: Not on file   Active Member of Clubs or Organizations: Not on file   Attends Banker Meetings: Not on file   Marital Status: Married  Intimate Partner Violence: Not At Risk   Fear of Current or Ex-Partner: No   Emotionally Abused: No   Physically Abused: No   Sexually Abused: No    Outpatient Medications Prior to Visit  Medication Sig Dispense Refill   atorvastatin (LIPITOR) 10 MG tablet One tablet q Monday, Wednesday and Friday. 40 tablet 1   Biotin 1000 MCG tablet Take 1,000 mcg by mouth 3 (three) times daily. Reported on 05/13/2015     Bromfenac Sodium 0.09 % SOLN Apply to eye.     Calcium Carbonate (CALCIUM 600 PO) Take by mouth daily. Reported on 05/13/2015     Cholecalciferol (VITAMIN D PO) Take by mouth daily. Reported on 05/13/2015     famotidine (PEPCID) 20 MG tablet Take 20 mg by mouth at bedtime.     hydrocortisone 2.5 % cream      omeprazole (PRILOSEC) 20 MG capsule Take 20 mg by mouth daily.     polyethylene glycol (MIRALAX) 17 g packet Take 17 g by mouth daily. 14 each 0   sertraline (ZOLOFT) 25 MG tablet Take 1 tablet (25 mg total) by mouth daily. 30 tablet 1   triamcinolone (KENALOG) 0.1 % paste SMARTSIG:Sparingly TO TEETH Twice Daily      vitamin B-12 (CYANOCOBALAMIN) 1000 MCG tablet Take 1,000 mcg by mouth daily. Reported on 05/13/2015     Azelastine HCl 137 MCG/SPRAY SOLN PLACE 1 SPRAY INTO BOTH NOSTRILS 2 (TWO) TIMES DAILY. USE IN EACH NOSTRIL  AS DIRECTED 30 mL 1   Facility-Administered Medications Prior to Visit  Medication Dose Route Frequency Provider Last Rate Last Admin   betamethasone acetate-betamethasone sodium phosphate (CELESTONE) injection 3 mg  3 mg Intramuscular Once Gala Lewandowsky M, DPM       betamethasone acetate-betamethasone sodium phosphate (CELESTONE) injection 3 mg  3 mg Intramuscular Once Felecia Shelling, DPM        Allergies  Allergen Reactions   Darvocet [Propoxyphene N-Acetaminophen]     Patient stated that medication made her heart beat fast.   Morphine And Related     Patient stated that medication made her heart beat fast.    Review of Systems  Constitutional: Negative.   HENT:  Positive for congestion, postnasal drip and rhinorrhea. Negative for dental problem, drooling, ear discharge, ear pain, facial swelling, hearing loss, mouth sores, nosebleeds, sinus pressure, sinus pain, sneezing, sore throat, tinnitus, trouble swallowing and voice change.   Eyes: Negative.   Respiratory:  Positive for cough and wheezing (REPORTED PER PATIENT AND NO WHEEZING IN OFFICE). Negative for apnea, choking, chest tightness, shortness of breath and stridor.   Cardiovascular: Negative.  Negative for chest pain, palpitations and leg swelling.  Gastrointestinal:  Negative for abdominal distention, abdominal pain, anal bleeding, blood in stool and constipation.       REFLUX   Genitourinary: Negative.   Musculoskeletal: Negative.   Skin: Negative.   Neurological: Negative.   Psychiatric/Behavioral: Negative.        Objective:    Physical Exam Constitutional:      General: She is not in acute distress.    Appearance: Normal appearance. She is not ill-appearing, toxic-appearing or diaphoretic.     Comments:  Patient appers well, not sickly. Speaking in complete sentences. Patient moves on and off of exam table and in room without difficulty. Gait is normal in hall and in room. Patient is oriented to person place time and situation. Patient answers questions appropriately and engages eye contact and verbal dialect with provider.    HENT:     Head: Normocephalic and atraumatic.     Right Ear: Ear canal and external ear normal. There is no impacted cerumen.     Left Ear: Ear canal and external ear normal. There is no impacted cerumen.     Ears:     Comments:  Cobblestoning posterior pharynx; bilateral allergic shiners; bilateral TM's fluid clear; bilateral nasal turbinates mild edema erythema clear discharge;       Nose: Congestion present. No rhinorrhea.     Mouth/Throat:     Mouth: Mucous membranes are moist.     Pharynx: Oropharynx is clear. No oropharyngeal exudate or posterior oropharyngeal erythema.  Eyes:     General: No scleral icterus.       Right eye: No discharge.        Left eye: No discharge.     Extraocular Movements: Extraocular movements intact.     Conjunctiva/sclera: Conjunctivae normal.     Pupils: Pupils are equal, round, and reactive to light.  Neck:     Vascular: No carotid bruit.  Cardiovascular:     Rate and Rhythm: Normal rate and regular rhythm.     Pulses: Normal pulses.     Heart sounds: Normal heart sounds. No murmur heard.   No gallop.  Pulmonary:     Effort: Pulmonary effort is normal. No respiratory distress.     Breath sounds: Normal breath sounds. No stridor. No wheezing, rhonchi or rales.  Comments: NO ADVENTITIOUS SOUNDS ON EXAM THROUGHOUT  Chest:     Chest wall: No tenderness.  Abdominal:     General: Bowel sounds are normal. There is no distension.     Palpations: Abdomen is soft.     Tenderness: There is no abdominal tenderness.  Musculoskeletal:        General: Normal range of motion.     Cervical back: Normal range of motion and neck supple.  No rigidity or tenderness.     Right lower leg: No edema.     Left lower leg: No edema.  Lymphadenopathy:     Cervical: No cervical adenopathy.  Skin:    General: Skin is warm.     Findings: No erythema.  Neurological:     Mental Status: She is oriented to person, place, and time.     Motor: No weakness.     Gait: Gait normal.  Psychiatric:        Mood and Affect: Mood normal.        Behavior: Behavior normal.        Thought Content: Thought content normal.        Judgment: Judgment normal.    BP 132/86 (BP Location: Left Arm, Patient Position: Sitting, Cuff Size: Small)   Pulse 78   Temp 98 F (36.7 C) (Oral)   Ht 5\' 4"  (1.626 m)   Wt 133 lb (60.3 kg)   SpO2 98%   BMI 22.83 kg/m  Wt Readings from Last 3 Encounters:  04/13/21 133 lb (60.3 kg)  01/07/21 135 lb (61.2 kg)  12/03/20 133 lb 12.8 oz (60.7 kg)    Health Maintenance Due  Topic Date Due   MAMMOGRAM  02/24/2021    There are no preventive care reminders to display for this patient.   Lab Results  Component Value Date   TSH 3.26 02/12/2020   Lab Results  Component Value Date   WBC 7.3 09/17/2020   HGB 13.4 09/17/2020   HCT 39.5 09/17/2020   MCV 96.1 09/17/2020   PLT 321 09/17/2020   Lab Results  Component Value Date   NA 141 12/03/2020   K 3.9 12/03/2020   CO2 28 12/03/2020   GLUCOSE 88 12/03/2020   BUN 21 12/03/2020   CREATININE 0.96 12/03/2020   BILITOT 0.5 12/03/2020   ALKPHOS 110 12/03/2020   AST 19 12/03/2020   ALT 14 12/03/2020   PROT 7.4 12/03/2020   ALBUMIN 4.7 12/03/2020   CALCIUM 9.5 12/03/2020   ANIONGAP 11 09/17/2020   GFR 55.47 (L) 12/03/2020   Lab Results  Component Value Date   CHOL 250 (H) 07/24/2020   Lab Results  Component Value Date   HDL 63.00 07/24/2020   Lab Results  Component Value Date   LDLCALC 161 (H) 07/24/2020   Lab Results  Component Value Date   TRIG 127.0 07/24/2020   Lab Results  Component Value Date   CHOLHDL 4 07/24/2020   Lab Results   Component Value Date   HGBA1C 5.6 12/03/2020       Assessment & Plan:   Problem List Items Addressed This Visit       Other   Acute cough - Primary   Relevant Medications   Azelastine HCl 137 MCG/SPRAY SOLN   Other Relevant Orders   CBC with Differential/Platelet   Comprehensive metabolic panel   TSH   DG Chest 2 View   COVID-19, Flu A+B and RSV   History of wheezing   Relevant Orders  DG Chest 2 View   COVID-19, Flu A+B and RSV   Seasonal allergies   Relevant Medications   loratadine (CLARITIN) 5 MG chewable tablet   Other Relevant Orders   COVID-19, Flu A+B and RSV     Meds ordered this encounter  Medications   Azelastine HCl 137 MCG/SPRAY SOLN    Sig: PLACE 1 SPRAY INTO BOTH NOSTRILS 2 (TWO) TIMES DAILY. USE IN EACH NOSTRIL AS DIRECTED    Dispense:  30 mL    Refill:  1   loratadine (CLARITIN) 5 MG chewable tablet    Sig: Chew 1 tablet (5 mg total) by mouth daily.    Dispense:  30 tablet    Refill:  2  RECOMMEND CHILDRENS CLARITIN SINCE 10 MG DRIES HER OUT TOO MUCH SHE REPORTS. ALSO SHE IS ADVISED TO RESTART HER ABOVE NASAL SPRAY.  Labs today.  CHEST X RAY TODAY TO RULE OUT INFECTION. NO WHEEZING ON EXAM TODAY. NASAL CONGESTION PRESENT.  HUSBAND AND FAMILY HAVE BEEN SICK WILL TEST FOR COVID INFLUENZA AND RSV.  Advised to keep further work up with gastroenterology as scheduled and have follow up with Dr. Lorin Picket prior to any surgery for pre surgical clearance for elective blepharoplasty.  Red Flags discussed. The patient was given clear instructions to go to ER or return to medical center if any red flags develop, symptoms do not improve, worsen or new problems develop. They verbalized understanding.   Return in about 1 week (around 04/20/2021), or if symptoms worsen or fail to improve, for at any time for any worsening symptoms, Go to Emergency room/ urgent care if worse.   Jairo Ben, FNP

## 2021-04-13 NOTE — Patient Instructions (Signed)
?Start childrens claritin  once daily and use nasal spray again as directed.  ? ?Allergic Rhinitis, Adult ?Allergic rhinitis is an allergic reaction that affects the mucous membrane inside the nose. The mucous membrane is the tissue that produces mucus. ?There are two types of allergic rhinitis: ?Seasonal. This type is also called hay fever and happens only during certain seasons. ?Perennial. This type can happen at any time of the year. ?Allergic rhinitis cannot be spread from person to person. This condition can be mild, moderate, or severe. It can develop at any age and may be outgrown. ?What are the causes? ?This condition is caused by allergens. These are things that can cause an allergic reaction. Allergens may differ for seasonal allergic rhinitis and perennial allergic rhinitis. ?Seasonal allergic rhinitis is triggered by pollen. Pollen can come from grasses, trees, and weeds. ?Perennial allergic rhinitis may be triggered by: ?Dust mites. ?Proteins in a pet's urine, saliva, or dander. Dander is dead skin cells from a pet. ?Smoke, mold, or car fumes. ?What increases the risk? ?You are more likely to develop this condition if you have a family history of allergies or other conditions related to allergies, including: ?Allergic conjunctivitis. This is inflammation of parts of the eyes and eyelids. ?Asthma. This condition affects the lungs and makes it hard to breathe. ?Atopic dermatitis or eczema. This is long term (chronic) inflammation of the skin. ?Food allergies. ?What are the signs or symptoms? ?Symptoms of this condition include: ?Sneezing or coughing. ?A stuffy nose (nasal congestion), itchy nose, or nasal discharge. ?Itchy eyes and tearing of the eyes. ?A feeling of mucus dripping down the back of your throat (postnasal drip). ?Trouble sleeping. ?Tiredness or fatigue. ?Headache. ?Sore throat. ?How is this diagnosed? ?This condition may be diagnosed with your symptoms, medical history, and physical  exam. Your health care provider may check for related conditions, such as: ?Asthma. ?Pink eye. This is eye inflammation caused by infection (conjunctivitis). ?Ear infection. ?Upper respiratory infection. This is an infection in the nose, throat, or upper airways. ?You may also have tests to find out which allergens trigger your symptoms. These may include skin tests or blood tests. ?How is this treated? ?There is no cure for this condition, but treatment can help control symptoms. Treatment may include: ?Taking medicines that block allergy symptoms, such as corticosteroids and antihistamines. Medicine may be given as a shot, nasal spray, or pill. ?Avoiding any allergens. ?Being exposed again and again to tiny amounts of allergens to help you build a defense against allergens (immunotherapy). This is done if other treatments have not helped. It may include: ?Allergy shots. These are injected medicines that have small amounts of allergen in them. ?Sublingual immunotherapy. This involves taking small doses of a medicine with allergen in it under your tongue. ?If these treatments do not work, your health care provider may prescribe newer, stronger medicines. ?Follow these instructions at home: ?Avoiding allergens ?Find out what you are allergic to and avoid those allergens. These are some things you can do to help avoid allergens: ?If you have perennial allergies: ?Replace carpet with wood, tile, or vinyl flooring. Carpet can trap dander and dust. ?Do not smoke. Do not allow smoking in your home. ?Change your heating and air conditioning filters at least once a month. ?If you have seasonal allergies, take these steps during allergy season: ?Keep windows closed as much as possible. ?Plan outdoor activities when pollen counts are lowest. Check pollen counts before you plan outdoor activities. ?When coming indoors, change  clothing and shower before sitting on furniture or bedding. ?If you have a pet in the house that  produces allergens: ?Keep the pet out of the bedroom. ?Vacuum, sweep, and dust regularly. ?General instructions ?Take over-the-counter and prescription medicines only as told by your health care provider. ?Drink enough fluid to keep your urine pale yellow. ?Keep all follow-up visits as told by your health care provider. This is important. ?Where to find more information ?American Academy of Allergy, Asthma & Immunology: www.aaaai.org ?Contact a health care provider if: ?You have a fever. ?You develop a cough that does not go away. ?You make whistling sounds when you breathe (wheeze). ?Your symptoms slow you down or stop you from doing your normal activities each day. ?Get help right away if: ?You have shortness of breath. ?This symptom may represent a serious problem that is an emergency. Do not wait to see if the symptom will go away. Get medical help right away. Call your local emergency services (911 in the [place] Do not drive yourself to the hospital. ?Summary ?Allergic rhinitis may be managed by taking medicines as directed and avoiding allergens. ?If you have seasonal allergies, keep windows closed as much as possible during allergy season. ?Contact your health care provider if you develop a fever or a cough that does not go away. ?This information is not intended to replace advice given to you by your health care provider. Make sure you discuss any questions you have with your health care provider. ?Document Revised: 03/01/2019 Document Reviewed: 01/08/2019 ?Elsevier Patient Education ? 2022 [employer]vier Inc. ?Cough, Adult ?A cough helps to clear your throat and lungs. A cough may be a sign of an illness or another medical condition. ?An acute cough may only last 2-3 weeks, while a chronic cough may last 8 or more weeks. ?Many things can cause a cough. They include: ?Germs (viruses or bacteria) that attack the airway. ?Breathing in things that bother (irritate) your lungs. ?Allergies. ?Asthma. ?Mucus that runs  down the back of your throat (postnasal drip). ?Smoking. ?Acid backing up from the stomach into the tube that moves food from the mouth to the stomach (gastroesophageal reflux). ?Some medicines. ?Lung problems. ?Other medical conditions, such as heart failure or a blood clot in the lung (pulmonary embolism). ?Follow these instructions at home: ?Medicines ?Take over-the-counter and prescription medicines only as told by your doctor. ?Talk with your doctor before you take medicines that stop a cough (cough suppressants). ?Lifestyle ? ?Do not smoke, and try not to be around smoke. Do not use any products that contain nicotine or tobacco, such as cigarettes, e-cigarettes, and chewing tobacco. If you need help quitting, ask your doctor. ?Drink enough fluid to keep your pee (urine) pale yellow. ?Avoid caffeine. ?Do not drink alcohol if your doctor tells you not to drink. ?General instructions ? ?Watch for any changes in your cough. Tell your doctor about them. ?Always cover your mouth when you cough. ?Stay away from things that make you cough, such as perfume, candles, campfire smoke, or cleaning products. ?If the air is dry, use a cool mist vaporizer or humidifier in your home. ?If your cough is worse at night, try using extra pillows to raise your head up higher while you sleep. ?Rest as needed. ?Keep all follow-up visits as told by your doctor. This is important. ?Contact a doctor if: ?You have new symptoms. ?You cough up pus. ?Your cough does not get better after 2-3 weeks, or your cough gets worse. ?Cough medicine does not  help your cough and you are not sleeping well. ?You have pain that gets worse or pain that is not helped with medicine. ?You have a fever. ?You are losing weight and you do not know why. ?You have night sweats. ?Get help right away if: ?You cough up blood. ?You have trouble breathing. ?Your heartbeat is very fast. ?These symptoms may be an emergency. Do not wait to see if the symptoms will go away.  Get medical help right away. Call your local emergency services (911 in the [place] Do not drive yourself to the hospital. ?Summary ?A cough helps to clear your throat and lungs. Many things can cause a cough.

## 2021-04-14 NOTE — Progress Notes (Signed)
Chest x ray is still not resulted.  ?Platelets mild increase otherwise within normal limits. Recheck CBC in one week advised.  ?TSH within normal limits.  ?GFR kidney function is slightly more decreased, avoid NSAID's and hydrate.  ?Schedule follow up with PCP prior to any elective surgery is advised.  ?Follow up if any symptoms worsening at anytime.

## 2021-04-14 NOTE — Progress Notes (Signed)
Chest x ray is within normal limits.

## 2021-04-15 ENCOUNTER — Ambulatory Visit
Admission: RE | Admit: 2021-04-15 | Discharge: 2021-04-15 | Disposition: A | Discharge status: Home / Self Care | Payer: Medicare Other | Source: Ambulatory Visit | Attending: Gastroenterology | Admitting: Gastroenterology

## 2021-04-15 ENCOUNTER — Telehealth: Payer: Self-pay | Admitting: Internal Medicine

## 2021-04-15 DIAGNOSIS — R1013 Epigastric pain: Secondary | ICD-10-CM | POA: Insufficient documentation

## 2021-04-15 NOTE — Telephone Encounter (Signed)
Pt called about xray results  °

## 2021-04-16 LAB — COVID-19, FLU A+B AND RSV
Influenza A, NAA: NOT DETECTED
Influenza B, NAA: NOT DETECTED
RSV, NAA: NOT DETECTED
SARS-CoV-2, NAA: NOT DETECTED

## 2021-04-16 NOTE — Telephone Encounter (Signed)
Patient aware cxr was normal ?

## 2021-04-16 NOTE — Telephone Encounter (Signed)
Pt calling about previous message. ?

## 2021-05-07 ENCOUNTER — Ambulatory Visit
Admission: RE | Admit: 2021-05-07 | Discharge: 2021-05-07 | Disposition: A | Discharge status: Home / Self Care | Payer: Medicare Other | Source: Ambulatory Visit | Attending: Internal Medicine | Admitting: Internal Medicine

## 2021-05-07 DIAGNOSIS — Z1231 Encounter for screening mammogram for malignant neoplasm of breast: Secondary | ICD-10-CM | POA: Diagnosis not present

## 2021-05-31 ENCOUNTER — Telehealth: Payer: Self-pay

## 2021-05-31 NOTE — Telephone Encounter (Signed)
Patient called to say she is having chest pains which started an hour or so ago.  Patient said she had been working in the yard.  Patient states the pain is between her breasts and she isn't sure if it's heartburn or something else.   ? ?Transferred patient to Access Nurse. ?

## 2021-06-01 ENCOUNTER — Telehealth: Payer: Self-pay

## 2021-06-01 NOTE — Telephone Encounter (Signed)
Msg sent

## 2021-06-01 NOTE — Telephone Encounter (Signed)
If having chest pain,needs to be seen.  Please call again and see if can contact pt.  Should also have emergency contact number  ?

## 2021-06-01 NOTE — Telephone Encounter (Signed)
Pt returning call

## 2021-06-01 NOTE — Telephone Encounter (Signed)
Pt returning call to set up appoinmentt but there is no available appointments for weeks.  Pt want to drop off a specimen because she is having bladder issues ?

## 2021-06-01 NOTE — Telephone Encounter (Signed)
Reviewed.  Still recommend for her to be evaluated.  If refuses, at least schedule her a f/u appt with me.  ?

## 2021-06-01 NOTE — Telephone Encounter (Signed)
LM on both numbers asking her to call back that we could triage & make sure that she is/was seen.  ?

## 2021-06-01 NOTE — Telephone Encounter (Signed)
Lm with pt husband - pt went to the laundromat - to have pt call to schedule follow up appointment for eval ?

## 2021-06-01 NOTE — Telephone Encounter (Signed)
Noted. Thanks.

## 2021-06-01 NOTE — Telephone Encounter (Signed)
Attempted to call pt : ?Home # - no answer, left a message. ?Mobile number #1 - no answer, no voicemail. ?Mobile number #2 - no answer, left message. ?

## 2021-06-01 NOTE — Telephone Encounter (Signed)
? ?  This triage note from access nurse but unable;e to reach patient. ?

## 2021-06-02 ENCOUNTER — Other Ambulatory Visit: Payer: Self-pay

## 2021-06-02 DIAGNOSIS — R300 Dysuria: Secondary | ICD-10-CM

## 2021-06-02 NOTE — Telephone Encounter (Signed)
S/w pt - scheduled for 5/17 at 11 for f/u after chest pains and for poss UTI. ?Pt will come tomorrow and drop [place] a urine specimen for UCX. ?Order placed. ?

## 2021-06-09 ENCOUNTER — Ambulatory Visit (INDEPENDENT_AMBULATORY_CARE_PROVIDER_SITE_OTHER): Payer: Medicare Other | Admitting: Internal Medicine

## 2021-06-09 ENCOUNTER — Encounter: Payer: Self-pay | Admitting: Internal Medicine

## 2021-06-09 ENCOUNTER — Telehealth: Payer: Self-pay | Admitting: Internal Medicine

## 2021-06-09 ENCOUNTER — Other Ambulatory Visit (HOSPITAL_COMMUNITY)
Admission: RE | Admit: 2021-06-09 | Discharge: 2021-06-09 | Disposition: A | Discharge status: Home / Self Care | Payer: Medicare Other | Source: Ambulatory Visit | Attending: Internal Medicine | Admitting: Internal Medicine

## 2021-06-09 VITALS — BP 134/68 | HR 68 | Temp 97.6°F | Resp 15 | Ht 63.0 in | Wt 135.8 lb

## 2021-06-09 DIAGNOSIS — D75839 Thrombocytosis, unspecified: Secondary | ICD-10-CM

## 2021-06-09 DIAGNOSIS — R300 Dysuria: Secondary | ICD-10-CM | POA: Diagnosis not present

## 2021-06-09 DIAGNOSIS — F439 Reaction to severe stress, unspecified: Secondary | ICD-10-CM

## 2021-06-09 DIAGNOSIS — N1831 Chronic kidney disease, stage 3a: Secondary | ICD-10-CM

## 2021-06-09 DIAGNOSIS — R739 Hyperglycemia, unspecified: Secondary | ICD-10-CM

## 2021-06-09 DIAGNOSIS — I10 Essential (primary) hypertension: Secondary | ICD-10-CM

## 2021-06-09 DIAGNOSIS — K219 Gastro-esophageal reflux disease without esophagitis: Secondary | ICD-10-CM

## 2021-06-09 DIAGNOSIS — E7800 Pure hypercholesterolemia, unspecified: Secondary | ICD-10-CM

## 2021-06-09 DIAGNOSIS — N949 Unspecified condition associated with female genital organs and menstrual cycle: Secondary | ICD-10-CM

## 2021-06-09 DIAGNOSIS — R079 Chest pain, unspecified: Secondary | ICD-10-CM

## 2021-06-09 DIAGNOSIS — R002 Palpitations: Secondary | ICD-10-CM

## 2021-06-09 DIAGNOSIS — E041 Nontoxic single thyroid nodule: Secondary | ICD-10-CM

## 2021-06-09 DIAGNOSIS — I700 Atherosclerosis of aorta: Secondary | ICD-10-CM

## 2021-06-09 LAB — POCT URINALYSIS DIPSTICK
Bilirubin, UA: NEGATIVE
Blood, UA: NEGATIVE
Glucose, UA: NEGATIVE
Ketones, UA: 5
Nitrite, UA: NEGATIVE
Protein, UA: NEGATIVE
Spec Grav, UA: 1.01 (ref 1.010–1.025)
Urobilinogen, UA: 0.2 E.U./dL
pH, UA: 7 (ref 5.0–8.0)

## 2021-06-09 LAB — URINALYSIS, MICROSCOPIC ONLY

## 2021-06-09 NOTE — Telephone Encounter (Signed)
Pt called in requesting refill on medication (omeprazole (PRILOSEC) 20 MG capsule)... Pt requesting callback  ? ? ?

## 2021-06-09 NOTE — Progress Notes (Signed)
Patient ID: Carrie Noble, female   DOB: [birth year], 81 y.o.   MRN: [id number]   Subjective:    Patient ID: Carrie Noble, female    DOB: 1940/12/24, 80 y.o.   MRN: [id number]   Patient here for  a scheduled follow up.    Chief Complaint  Patient presents with   Follow-up    Follow up - GERD / poss UTI   Gastroesophageal Reflux   Dysuria   .   HPI Has noticed a burning sensation over the last 2-3 weeks.  Was questioning the possibility of a UTI.  Describes vaginal burning.  Intravaginal burning.  No discharge.  No abdominal pain.  Bowels moving.  Did have an episode when working in her yard - sharp chest pain.  Lasted approximately 10 minutes.  No further episodes.  Has cut out caffeine. Palpitations resolved.  Off carafate.  Felt did not tolerate.     Past Medical History:  Diagnosis Date   Allergy    Arthritis    Gastroesophageal reflux    Hiatal hernia 1989   status post Nissen fundoplication    Hx of hysterectomy    Hyperlipidemia    Tachycardia    Patient stated that this has been occuring frequently.   Thyroid disease    Goiter   Past Surgical History:  Procedure Laterality Date   ABDOMINAL HYSTERECTOMY  1980   partial   BREAST BIOPSY Right    neg   BREAST EXCISIONAL BIOPSY Left 2014   neg   COLONOSCOPY  2015   ESOPHAGOGASTRIC FUNDOPLICATION  6295   LAPAROSCOPIC NISSEN FUNDOPLICATION  2841   TONSILLECTOMY     as well as goiter   UPPER GI ENDOSCOPY  2015   Family History  Problem Relation Age of Onset   Stroke Mother 76   Arthritis Mother    Heart disease Mother    Hypertension Mother    Stroke Father 9   Hypertension Father    Rheumatic fever Brother        and multiple open heart surgeries   Diabetes Brother        type 2   Stroke Brother    Other Sister        Coronary Atherosclerosis   Stroke Brother    Stroke Sister    Heart disease Sister    Dementia Sister    Cancer Sister        ovarian   Breast cancer Neg Hx    Social History    Socioeconomic History   Marital status: Married    Spouse name: Not on file   Number of children: Not on file   Years of education: Not on file   Highest education level: Not on file  Occupational History   Occupation: Retired    Fish farm manager: OTHER  Tobacco Use   Smoking status: Never   Smokeless tobacco: Never  Substance and Sexual Activity   Alcohol use: No    Alcohol/week: 0.0 standard drinks   Drug use: No   Sexual activity: Never  Other Topics Concern   Not on file  Social History Narrative   Occasionally drinks coffee.   Social Determinants of Health   Financial Resource Strain: Not on file  Food Insecurity: Not on file  Transportation Needs: Not on file  Physical Activity: Not on file  Stress: Not on file  Social Connections: Not on file     Review of Systems  Constitutional:  Negative for appetite change and  unexpected weight change.  HENT:  Negative for congestion and sinus pressure.   Respiratory:  Negative for cough, chest tightness and shortness of breath.   Cardiovascular:  Negative for chest pain, palpitations and leg swelling.  Gastrointestinal:  Negative for abdominal pain, diarrhea, nausea and vomiting.  Genitourinary:  Negative for difficulty urinating.       Vaginal burning as outlined.    Musculoskeletal:  Negative for joint swelling and myalgias.  Skin:  Negative for color change and rash.  Neurological:  Negative for dizziness, light-headedness and headaches.  Psychiatric/Behavioral:  Negative for agitation and dysphoric mood.       Objective:     BP 134/68 (BP Location: Left Arm, Patient Position: Sitting, Cuff Size: Small)   Pulse 68   Temp 97.6 F (36.4 C) (Temporal)   Resp 15   Ht 5' 3" (1.6 m)   Wt 135 lb 12.8 oz (61.6 kg)   SpO2 97%   BMI 24.06 kg/m  Wt Readings from Last 3 Encounters:  06/09/21 135 lb 12.8 oz (61.6 kg)  04/13/21 133 lb (60.3 kg)  01/07/21 135 lb (61.2 kg)    Physical Exam Vitals reviewed.   Constitutional:      General: She is not in acute distress.    Appearance: Normal appearance.  HENT:     Head: Normocephalic and atraumatic.     Right Ear: External ear normal.     Left Ear: External ear normal.  Eyes:     General: No scleral icterus.       Right eye: No discharge.        Left eye: No discharge.     Conjunctiva/sclera: Conjunctivae normal.  Neck:     Thyroid: No thyromegaly.  Cardiovascular:     Rate and Rhythm: Normal rate and regular rhythm.  Pulmonary:     Effort: No respiratory distress.     Breath sounds: Normal breath sounds. No wheezing.  Abdominal:     General: Bowel sounds are normal.     Palpations: Abdomen is soft.     Tenderness: There is no abdominal tenderness.  Genitourinary:    Comments: Normal external genitalia.  Vaginal vault without lesions. Atrophy changes present. Could not appreciate any adnexal masses or tenderness.   Musculoskeletal:        General: No swelling or tenderness.     Cervical back: Neck supple. No tenderness.  Lymphadenopathy:     Cervical: No cervical adenopathy.  Skin:    Findings: No erythema or rash.  Neurological:     Mental Status: She is alert.  Psychiatric:        Mood and Affect: Mood normal.        Behavior: Behavior normal.     Outpatient Encounter Medications as of 06/09/2021  Medication Sig   atorvastatin (LIPITOR) 10 MG tablet One tablet q Monday, Wednesday and Friday.   Azelastine HCl 137 MCG/SPRAY SOLN PLACE 1 SPRAY INTO BOTH NOSTRILS 2 (TWO) TIMES DAILY. USE IN EACH NOSTRIL AS DIRECTED   Biotin 1000 MCG tablet Take 1,000 mcg by mouth 3 (three) times daily. Reported on 05/13/2015   Bromfenac Sodium 0.09 % SOLN Apply to eye.   Calcium Carbonate (CALCIUM 600 PO) Take by mouth daily. Reported on 05/13/2015   Cholecalciferol (VITAMIN D PO) Take by mouth daily. Reported on 05/13/2015   famotidine (PEPCID) 20 MG tablet Take 20 mg by mouth at bedtime.   hydrocortisone 2.5 % cream    loratadine (CLARITIN) 5  MG chewable  tablet Chew 1 tablet (5 mg total) by mouth daily.   polyethylene glycol (MIRALAX) 17 g packet Take 17 g by mouth daily.   sertraline (ZOLOFT) 25 MG tablet Take 1 tablet (25 mg total) by mouth daily.   triamcinolone (KENALOG) 0.1 % paste SMARTSIG:Sparingly TO TEETH Twice Daily   vitamin B-12 (CYANOCOBALAMIN) 1000 MCG tablet Take 1,000 mcg by mouth daily. Reported on 05/13/2015   [DISCONTINUED] omeprazole (PRILOSEC) 20 MG capsule Take 20 mg by mouth daily.   Facility-Administered Encounter Medications as of 06/09/2021  Medication   betamethasone acetate-betamethasone sodium phosphate (CELESTONE) injection 3 mg   betamethasone acetate-betamethasone sodium phosphate (CELESTONE) injection 3 mg     Lab Results  Component Value Date   WBC 5.7 04/13/2021   HGB 13.4 04/13/2021   HCT 39.6 04/13/2021   PLT 422.0 (H) 04/13/2021   GLUCOSE 96 04/13/2021   CHOL 250 (H) 07/24/2020   TRIG 127.0 07/24/2020   HDL 63.00 07/24/2020   LDLDIRECT 141.0 09/01/2016   LDLCALC 161 (H) 07/24/2020   ALT 17 04/13/2021   AST 19 04/13/2021   NA 138 04/13/2021   K 4.3 04/13/2021   CL 102 04/13/2021   CREATININE 1.01 04/13/2021   BUN 17 04/13/2021   CO2 27 04/13/2021   TSH 4.08 04/13/2021   HGBA1C 5.6 12/03/2020    MM 3D SCREEN BREAST BILATERAL  Result Date: 05/10/2021 CLINICAL DATA:  Screening. EXAM: DIGITAL SCREENING BILATERAL MAMMOGRAM WITH TOMOSYNTHESIS AND CAD TECHNIQUE: Bilateral screening digital craniocaudal and mediolateral oblique mammograms were obtained. Bilateral screening digital breast tomosynthesis was performed. The images were evaluated with computer-aided detection. COMPARISON:  Previous exam(s). ACR Breast Density Category c: The breast tissue is heterogeneously dense, which may obscure small masses. FINDINGS: There are no findings suspicious for malignancy. IMPRESSION: No mammographic evidence of malignancy. A result letter of this screening mammogram will be mailed directly to the  patient. RECOMMENDATION: Screening mammogram in one year. (Code:SM-B-01Y) BI-RADS CATEGORY  1: Negative. Electronically Signed   By: Abelardo Diesel M.D.   On: 05/10/2021 08:01       Assessment & Plan:   Problem List Items Addressed This Visit     Aortic atherosclerosis ([place])    Continue lipitor.        Chest pain    Episode of chest pain as outlined.  Resolved without intervention.  Discussed with her regarding further w/up - EKG, etc.  She declines further w/up at this time.  Wants to monitor.  Continue risk factor modification.        CKD (chronic kidney disease) stage 3, GFR 30-59 ml/min (HCC)    Continue to avoid antiinflammatories.  Stay hydrated.  Follow.  Follow metabolic panel.        Dysuria - Primary    Urine as outlined.  Await culture results.         Relevant Orders   POCT Urinalysis Dipstick (Completed)   Urine Culture (Completed)   Urine Microscopic Only (Completed)   Cervicovaginal ancillary only( [place]) (Completed)   Hypercholesterolemia    Continue lipitor.  Low cholesterol diet and exercise.  Follow lipid panel and liver function tests.         Relevant Orders   Basic metabolic panel   Hepatic function panel   Lipid panel   Hyperglycemia    Low carb diet and exercise.  Follow met b and a1c.        Relevant Orders   Hemoglobin A1c   Palpitations    Previously  saw cardiology.  Better since stopping caffeine.  Follow.        Stress    Increased stress.  Discussed.  Continue zoloft.  Follow.        Thrombocytosis    Platelet count slightly increased last check.  Recheck cbc next labs.         Relevant Orders   CBC with Differential/Platelet   Thyroid nodule    Had follow-up with Dr. Honor Junes May 2022.  Thyroid ultrasound stable.  Recommended follow-up 1 year with thyroid ultrasound.       Vaginal burning    Urine dip - does not appear to be c/w UTI. Await culture.  Vaginal exam as outlined.  Await results of vaginal swab.   Discussed if urine and vaginal swab negative, will treat with estrogen cream.  Feel her symptoms related to atrophy.  Follow.        Benign essential HTN (Chronic)    Blood pressure ok.  Hold on medication.  Follow pressures.  Follow metabolic panel.        GERD (gastroesophageal reflux disease) (Chronic)    On prilosec now.  Did not feel she tolerated full dose of carafate.  Wants to try taking q day.  Have her take prilosec in am and carafate in pm. Follow.  Seeing GI.          Einar Pheasant, MD

## 2021-06-09 NOTE — Patient Instructions (Signed)
Take the sucralfate before your evening meal.   ? ?Take omeprazole in the 30 minutes before your breakfast and before bed.   ? ?Benefiber - daily.   ?

## 2021-06-10 ENCOUNTER — Other Ambulatory Visit: Payer: Self-pay

## 2021-06-10 LAB — URINE CULTURE
MICRO NUMBER:: 13408927
Result:: NO GROWTH
SPECIMEN QUALITY:: ADEQUATE

## 2021-06-10 LAB — CERVICOVAGINAL ANCILLARY ONLY
Bacterial Vaginitis (gardnerella): NEGATIVE
Candida Glabrata: NEGATIVE
Candida Vaginitis: NEGATIVE
Chlamydia: NEGATIVE
Comment: NEGATIVE
Comment: NEGATIVE
Comment: NEGATIVE
Comment: NEGATIVE
Comment: NEGATIVE
Comment: NORMAL
Neisseria Gonorrhea: NEGATIVE
Trichomonas: NEGATIVE

## 2021-06-10 MED ORDER — OMEPRAZOLE 20 MG PO CPDR: 20.0000 mg | DELAYED_RELEASE_CAPSULE | Freq: Every day | ORAL | 1 refills | Status: DC

## 2021-06-10 NOTE — Telephone Encounter (Signed)
sent 

## 2021-06-13 ENCOUNTER — Encounter: Payer: Self-pay | Admitting: Internal Medicine

## 2021-06-13 DIAGNOSIS — D75839 Thrombocytosis, unspecified: Secondary | ICD-10-CM | POA: Insufficient documentation

## 2021-06-13 NOTE — Assessment & Plan Note (Signed)
Previously saw cardiology.  Better since stopping caffeine.  Follow.

## 2021-06-13 NOTE — Assessment & Plan Note (Addendum)
On prilosec now.  Did not feel she tolerated full dose of carafate.  Wants to try taking q day.  Have her take prilosec in am and carafate in pm. Follow.  Seeing GI.

## 2021-06-13 NOTE — Assessment & Plan Note (Signed)
Continue lipitor  ?

## 2021-06-13 NOTE — Assessment & Plan Note (Signed)
Platelet count slightly increased last check.  Recheck cbc next labs.

## 2021-06-13 NOTE — Assessment & Plan Note (Signed)
Episode of chest pain as outlined.  Resolved without intervention.  Discussed with her regarding further w/up - EKG, etc.  She declines further w/up at this time.  Wants to monitor.  Continue risk factor modification.

## 2021-06-13 NOTE — Assessment & Plan Note (Signed)
Urine as outlined.  Await culture results.

## 2021-06-13 NOTE — Assessment & Plan Note (Signed)
Increased stress.  Discussed.  Continue zoloft.  Follow.  

## 2021-06-13 NOTE — Assessment & Plan Note (Signed)
Continue to avoid antiinflammatories.  Stay hydrated.  Follow.  Follow metabolic panel.  

## 2021-06-13 NOTE — Assessment & Plan Note (Signed)
Low carb diet and exercise.  Follow met b and a1c.  

## 2021-06-13 NOTE — Assessment & Plan Note (Signed)
Urine dip - does not appear to be c/w UTI. Await culture.  Vaginal exam as outlined.  Await results of vaginal swab.  Discussed if urine and vaginal swab negative, will treat with estrogen cream.  Feel her symptoms related to atrophy.  Follow.

## 2021-06-13 NOTE — Assessment & Plan Note (Signed)
Had follow-up with Dr. O'Connell May 2022.  Thyroid ultrasound stable.  Recommended follow-up 1 year with thyroid ultrasound. °

## 2021-06-13 NOTE — Assessment & Plan Note (Signed)
Blood pressure ok.  Hold on medication.  Follow pressures.  Follow metabolic panel.  °

## 2021-06-13 NOTE — Assessment & Plan Note (Signed)
Continue lipitor.  Low cholesterol diet and exercise.  Follow lipid panel and liver function tests.   

## 2021-06-14 ENCOUNTER — Ambulatory Visit: Payer: Medicare Other

## 2021-06-14 ENCOUNTER — Telehealth: Payer: Self-pay

## 2021-06-14 NOTE — Telephone Encounter (Signed)
No answer when called for scheduled AWV on preferred number.  Left message to reschedule.  

## 2021-06-15 ENCOUNTER — Telehealth: Payer: Self-pay

## 2021-06-15 NOTE — Telephone Encounter (Signed)
Patient states she was unable to have her appointment with Denisa yesterday because something came up and she states Denisa told her to call her at 8am on 06/15/2021.  Patient is calling for her appointment.  Please call.

## 2021-06-16 ENCOUNTER — Other Ambulatory Visit: Payer: Self-pay | Admitting: Internal Medicine

## 2021-06-16 DIAGNOSIS — R002 Palpitations: Secondary | ICD-10-CM

## 2021-06-16 DIAGNOSIS — R079 Chest pain, unspecified: Secondary | ICD-10-CM

## 2021-06-16 MED ORDER — ESTRADIOL 0.1 MG/GM VA CREA: TOPICAL_CREAM | VAGINAL | 2 refills | Status: DC

## 2021-06-16 NOTE — Progress Notes (Signed)
Order placed for cardiology referral and rx sent in for estrace cream.

## 2021-06-16 NOTE — Telephone Encounter (Signed)
Called patient and she stated she was mistaken on who she spoke with and scheduling for the AWV. Thomas Mabry has been on pal. Appointment has been scheduled.

## 2021-06-17 ENCOUNTER — Ambulatory Visit (INDEPENDENT_AMBULATORY_CARE_PROVIDER_SITE_OTHER): Payer: Medicare Other

## 2021-06-17 ENCOUNTER — Telehealth: Payer: Self-pay | Admitting: Internal Medicine

## 2021-06-17 VITALS — Ht 63.0 in | Wt 135.0 lb

## 2021-06-17 DIAGNOSIS — Z0000 Encounter for general adult medical examination without abnormal findings: Secondary | ICD-10-CM | POA: Diagnosis not present

## 2021-06-17 NOTE — Telephone Encounter (Signed)
Patient called and wanted to know if Dr Lorin Picket could prescribe another medication besides estradiol (ESTRACE VAGINAL) 0.1 MG/GM vaginal cream, it costs over $50 and patient says she can not afford that.

## 2021-06-17 NOTE — Patient Instructions (Addendum)
  Ms. Slaydon , Thank you for taking time to come for your Medicare Wellness Visit. I appreciate your ongoing commitment to your health goals. Please review the following plan we discussed and let me know if I can assist you in the future.   These are the goals we discussed:  Goals      Maintain Healthy Lifestyle     Healthy diet. Stay active.        This is a list of the screening recommended for you and due dates:  Health Maintenance  Topic Date Due   COVID-19 Vaccine (3 - Booster for Moderna series) 11/26/2019   Zoster (Shingles) Vaccine (1 of 2) 07/14/2021*   Pneumonia Vaccine (1 - PCV) 01/07/2022*   Flu Shot  08/24/2021   Mammogram  05/08/2022   Tetanus Vaccine  09/20/2023   DEXA scan (bone density measurement)  Completed   HPV Vaccine  Aged Out  *Topic was postponed. The date shown is not the original due date.

## 2021-06-17 NOTE — Telephone Encounter (Signed)
I am not sure that there is going to be one that is cheaper.  It will depend on her insurance preferred.  She can call her insurance and see if have preferred.  Also, you can call pharmacy and ask for an alternative medication.  (I know she has asked, but we can call and speak with pharmacist as well.  Also, in using the cream, as we discussed it does not take much per use (pea sized - even though directions say one applicatorfur - will usually last longer than one month.

## 2021-06-17 NOTE — Progress Notes (Signed)
Subjective:   Carrie Noble is a 81 y.o. female who presents for Medicare Annual (Subsequent) preventive examination.  Review of Systems    No ROS.  Medicare Wellness Virtual Visit.  Visual/audio telehealth visit, UTA vital signs.   See social history for additional risk factors.   Cardiac Risk Factors include: advanced age (>58men, >10 women);hypertension     Objective:    Today's Vitals   06/17/21 1637  Weight: 135 lb (61.2 kg)  Height: 5\' 3"  (1.6 m)   Body mass index is 23.91 kg/m.     06/17/2021    4:48 PM 09/17/2020   12:43 AM 06/09/2020    9:31 AM 01/22/2018    2:17 PM 10/31/2016   10:01 AM 10/30/2015    8:25 AM 10/06/2014    3:19 PM  Advanced Directives  Does Patient Have a Medical Advance Directive? No No No No No No No  Would patient like information on creating a medical advance directive? No - Patient declined No - Patient declined No - Patient declined  Yes (MAU/Ambulatory/Procedural Areas - Information given) No - patient declined information     Current Medications (verified) Outpatient Encounter Medications as of 06/17/2021  Medication Sig   atorvastatin (LIPITOR) 10 MG tablet One tablet q Monday, Wednesday and Friday.   Azelastine HCl 137 MCG/SPRAY SOLN PLACE 1 SPRAY INTO BOTH NOSTRILS 2 (TWO) TIMES DAILY. USE IN EACH NOSTRIL AS DIRECTED   Biotin 1000 MCG tablet Take 1,000 mcg by mouth 3 (three) times daily. Reported on 05/13/2015   Bromfenac Sodium 0.09 % SOLN Apply to eye.   Calcium Carbonate (CALCIUM 600 PO) Take by mouth daily. Reported on 05/13/2015   Cholecalciferol (VITAMIN D PO) Take by mouth daily. Reported on 05/13/2015   estradiol (ESTRACE VAGINAL) 0.1 MG/GM vaginal cream Apply one applicator q hs x 5 nights and then one applicator 2x week.   famotidine (PEPCID) 20 MG tablet Take 20 mg by mouth at bedtime.   hydrocortisone 2.5 % cream    loratadine (CLARITIN) 5 MG chewable tablet Chew 1 tablet (5 mg total) by mouth daily.   omeprazole (PRILOSEC) 20  MG capsule Take 1 capsule (20 mg total) by mouth daily.   polyethylene glycol (MIRALAX) 17 g packet Take 17 g by mouth daily.   sertraline (ZOLOFT) 25 MG tablet Take 1 tablet (25 mg total) by mouth daily.   triamcinolone (KENALOG) 0.1 % paste SMARTSIG:Sparingly TO TEETH Twice Daily   vitamin B-12 (CYANOCOBALAMIN) 1000 MCG tablet Take 1,000 mcg by mouth daily. Reported on 05/13/2015   Facility-Administered Encounter Medications as of 06/17/2021  Medication   betamethasone acetate-betamethasone sodium phosphate (CELESTONE) injection 3 mg   betamethasone acetate-betamethasone sodium phosphate (CELESTONE) injection 3 mg    Allergies (verified) Darvocet [propoxyphene n-acetaminophen] and Morphine and related   History: Past Medical History:  Diagnosis Date   Allergy    Arthritis    Gastroesophageal reflux    Hiatal hernia 1989   status post Nissen fundoplication    Hx of hysterectomy    Hyperlipidemia    Tachycardia    Patient stated that this has been occuring frequently.   Thyroid disease    Goiter   Past Surgical History:  Procedure Laterality Date   ABDOMINAL HYSTERECTOMY  1980   partial   BREAST BIOPSY Right    neg   BREAST EXCISIONAL BIOPSY Left 2014   neg   COLONOSCOPY  2015   ESOPHAGOGASTRIC FUNDOPLICATION  [id number]   LAPAROSCOPIC NISSEN FUNDOPLICATION  [id number]  TONSILLECTOMY     as well as goiter   UPPER GI ENDOSCOPY  2015   Family History  Problem Relation Age of Onset   Stroke Mother 39   Arthritis Mother    Heart disease Mother    Hypertension Mother    Stroke Father 82   Hypertension Father    Rheumatic fever Brother        and multiple open heart surgeries   Diabetes Brother        type 2   Stroke Brother    Other Sister        Coronary Atherosclerosis   Stroke Brother    Stroke Sister    Heart disease Sister    Dementia Sister    Cancer Sister        ovarian   Breast cancer Neg Hx    Social History   Socioeconomic History   Marital status:  Married    Spouse name: Not on file   Number of children: Not on file   Years of education: Not on file   Highest education level: Not on file  Occupational History   Occupation: Retired    Fish farm manager: OTHER  Tobacco Use   Smoking status: Never   Smokeless tobacco: Never  Substance and Sexual Activity   Alcohol use: No    Alcohol/week: 0.0 standard drinks   Drug use: No   Sexual activity: Never  Other Topics Concern   Not on file  Social History Narrative   Occasionally drinks coffee.   Social Determinants of Health   Financial Resource Strain: Low Risk    Difficulty of Paying Living Expenses: Not hard at all  Food Insecurity: No Food Insecurity   Worried About Charity fundraiser in the Last Year: Never true   [place] in the Last Year: Never true  Transportation Needs: No Transportation Needs   Lack of Transportation (Medical): No   Lack of Transportation (Non-Medical): No  Physical Activity: Insufficiently Active   Days of Exercise per Week: 7 days   Minutes of Exercise per Session: 20 min  Stress: No Stress Concern Present   Feeling of Stress : Not at all  Social Connections: Not on file    Tobacco Counseling Counseling given: Not Answered   Clinical Intake:  Pre-visit preparation completed: Yes        Diabetes: No  How often do you need to have someone help you when you read instructions, pamphlets, or other written materials from your doctor or pharmacy?: 1 - Never   Interpreter Needed?: No      Activities of Daily Living    06/17/2021    4:49 PM  In your present state of health, do you have any difficulty performing the following activities:  Hearing? 0  Vision? 0  Difficulty concentrating or making decisions? 0  Walking or climbing stairs? 0  Dressing or bathing? 0  Doing errands, shopping? 0  Preparing Food and eating ? N  Using the Toilet? N  In the past six months, have you accidently leaked urine? Y  Comment Managed with  brief. Followed by PCP  Do you have problems with loss of bowel control? N  Managing your Medications? N  Managing your Finances? N  Housekeeping or managing your Housekeeping? N    Patient Care Team: Einar Pheasant, MD as PCP - General (Internal Medicine) Bary Castilla Forest Gleason, MD (General Surgery)  Indicate any recent Medical Services you may have received from other than  [facility] providers in the past year (date may be approximate).     Assessment:   This is a routine wellness examination for [place].  Virtual Visit via Telephone Note  I connected with  [name] on 06/17/21 at  3:30 PM EDT by telephone and verified that I am speaking with the correct person using two identifiers.  Persons participating in the virtual visit: patient/Nurse Health Advisor   I discussed the limitations of performing an evaluation and management service by telehealth. Some vital signs may be absent or patient reported.   Hearing/Vision screen Hearing Screening - Comments:: Patient is able to hear conversational tones without difficulty.  No issues reported.  Vision Screening - Comments:: Followed by Dr. Glennon Mac  They have regular follow up with the ophthalmologist  Dietary issues and exercise activities discussed: Current Exercise Habits: Home exercise routine, Intensity: Mild Regular diet Good water intake   Goals Addressed             This Visit's Progress    Maintain Healthy Lifestyle       Healthy diet. Stay active.       Depression Screen    06/17/2021    4:47 PM 06/09/2021   10:54 AM 04/13/2021    8:38 AM 09/18/2020    4:17 PM 06/09/2020    9:27 AM 09/25/2019   11:15 AM 06/26/2018    8:24 AM  PHQ 2/9 Scores  PHQ - 2 Score 0 0 0 4 0 0 0    Fall Risk    06/17/2021    4:49 PM 06/09/2021   10:54 AM 04/13/2021    8:38 AM 09/18/2020    4:17 PM 06/09/2020    9:29 AM  Fall Risk   Falls in the past year? 0 0 0  0  Number falls in past yr: 0   0 0  Injury with Fall?    0 0  Risk  for fall due to : No Fall Risks No Fall Risks No Fall Risks    Follow up Falls evaluation completed Falls evaluation completed Falls evaluation completed Falls evaluation completed Falls evaluation completed    [place]: Home free of loose throw rugs in walkways, pet beds, electrical cords, etc? Yes  Adequate lighting in your home to reduce risk of falls? Yes   ASSISTIVE DEVICES UTILIZED TO PREVENT FALLS: Use of a cane, walker or w/c? No   TIMED UP AND GO: Was the test performed? No .   Cognitive Function: Patient is alert and oriented x3.  Enjoys crossword puzzles and other brain health stimulation.    10/31/2016   10:41 AM 10/30/2015    8:56 AM  MMSE - Mini Mental State Exam  Orientation to time 5 5  Orientation to Place 5 5  Registration 3 3  Attention/ Calculation 3 5  Attention/Calculation-comments Difficulty subtracting simple calculations   Recall 3 3  Language- name 2 objects 2 2  Language- repeat 1 1  Language- follow 3 step command 3 3  Language- read & follow direction 1 1  Write a sentence 1 1  Copy design 1 1  Total score 28 30        Immunizations Immunization History  Administered Date(s) Administered   Fluad Quad(high Dose 65+) 11/06/2018   Influenza, High Dose Seasonal PF 12/26/2017   Influenza,inj,Quad PF,6+ Mos 11/30/2012, 09/19/2013, 12/03/2020   Influenza-Unspecified 10/05/2011, 09/25/2019   Moderna Sars-Covid-2 Vaccination 08/28/2019, 10/01/2019   Td 09/19/2013  Screening Tests Health Maintenance  Topic Date Due   COVID-19 Vaccine (3 - Booster for Moderna series) 11/26/2019   Zoster Vaccines- Shingrix (1 of 2) 07/14/2021 (Originally 05/11/1989)   Pneumonia Vaccine 24+ Years old (1 - PCV) 01/07/2022 (Originally 05/11/2004)   INFLUENZA VACCINE  08/24/2021   MAMMOGRAM  05/08/2022   TETANUS/TDAP  09/20/2023   DEXA SCAN  Completed   HPV VACCINES  Aged Out   Health Maintenance Health Maintenance Due  Topic  Date Due   COVID-19 Vaccine (3 - Booster for Moderna series) 11/26/2019   Lung Cancer Screening: (Low Dose CT Chest recommended if Age 78-80 years, 30 pack-year currently smoking OR have quit w/in 15years.) does not qualify.   Hepatitis C Screening: does not qualify.  Vision Screening: Recommended annual ophthalmology exams for early detection of glaucoma and other disorders of the eye.  Dental Screening: Recommended annual dental exams for proper oral hygiene  Community Resource Referral / Chronic Care Management: CRR required this visit?  No   CCM required this visit?  No      Plan:   Keep all routine maintenance appointments.   I have personally reviewed and noted the following in the patient's chart:   Medical and social history Use of alcohol, tobacco or illicit drugs  Current medications and supplements including opioid prescriptions.  Functional ability and status Nutritional status Physical activity Advanced directives List of other physicians Hospitalizations, surgeries, and ER visits in previous 12 months Vitals Screenings to include cognitive, depression, and falls Referrals and appointments  In addition, I have reviewed and discussed with patient certain preventive protocols, quality metrics, and best practice recommendations. A written personalized care plan for preventive services as well as general preventive health recommendations were provided to patient.     Varney Biles, LPN   [id number]

## 2021-06-18 NOTE — Telephone Encounter (Signed)
S/w pharmacy - no alternative. Paid claim, no cheaper alternative listed.  LM for [place] to cb

## 2021-06-22 ENCOUNTER — Telehealth: Payer: Self-pay

## 2021-06-22 NOTE — Telephone Encounter (Signed)
Attempted to call pt 2x - phone busy LM on cell

## 2021-06-22 NOTE — Telephone Encounter (Signed)
Spoke w/ pt - advised use only pea size amount

## 2021-06-22 NOTE — Telephone Encounter (Signed)
Patient states she picked up her prescription of estradiol (ESTRACE VAGINAL) 0.1 MG/GM vaginal cream from [facility] and the directions on her prescription were different from what Dr. Einar Pheasant told her.  Patient states Dr. Nicki Reaper told her to use a pea-size amount and the instructions on the prescription would have her use a lot more.  Patient states she used one application per the instructions on her medication and since then has been using the pea-size amount that Dr. Nicki Reaper told her to use.  Patient states the medication is expensive, so she would like to verify which is correct.Please call.

## 2021-06-30 ENCOUNTER — Other Ambulatory Visit (INDEPENDENT_AMBULATORY_CARE_PROVIDER_SITE_OTHER): Payer: Medicare Other

## 2021-06-30 ENCOUNTER — Telehealth: Payer: Self-pay

## 2021-06-30 DIAGNOSIS — R739 Hyperglycemia, unspecified: Secondary | ICD-10-CM | POA: Diagnosis not present

## 2021-06-30 DIAGNOSIS — E7800 Pure hypercholesterolemia, unspecified: Secondary | ICD-10-CM

## 2021-06-30 DIAGNOSIS — R300 Dysuria: Secondary | ICD-10-CM

## 2021-06-30 DIAGNOSIS — D75839 Thrombocytosis, unspecified: Secondary | ICD-10-CM | POA: Diagnosis not present

## 2021-06-30 LAB — CBC WITH DIFFERENTIAL/PLATELET
Basophils Absolute: 0.1 10*3/uL (ref 0.0–0.1)
Basophils Relative: 0.7 % (ref 0.0–3.0)
Eosinophils Absolute: 0.3 10*3/uL (ref 0.0–0.7)
Eosinophils Relative: 3.4 % (ref 0.0–5.0)
HCT: 38.5 % (ref 36.0–46.0)
Hemoglobin: 13.2 g/dL (ref 12.0–15.0)
Lymphocytes Relative: 29.5 % (ref 12.0–46.0)
Lymphs Abs: 2.2 10*3/uL (ref 0.7–4.0)
MCHC: 34.2 g/dL (ref 30.0–36.0)
MCV: 93 fl (ref 78.0–100.0)
Monocytes Absolute: 0.5 10*3/uL (ref 0.1–1.0)
Monocytes Relative: 7.3 % (ref 3.0–12.0)
Neutro Abs: 4.4 10*3/uL (ref 1.4–7.7)
Neutrophils Relative %: 59.1 % (ref 43.0–77.0)
Platelets: 368 10*3/uL (ref 150.0–400.0)
RBC: 4.14 Mil/uL (ref 3.87–5.11)
RDW: 14.1 % (ref 11.5–15.5)
WBC: 7.4 10*3/uL (ref 4.0–10.5)

## 2021-06-30 LAB — BASIC METABOLIC PANEL
BUN: 17 mg/dL (ref 6–23)
CO2: 28 mEq/L (ref 19–32)
Calcium: 9.7 mg/dL (ref 8.4–10.5)
Chloride: 103 mEq/L (ref 96–112)
Creatinine, Ser: 0.96 mg/dL (ref 0.40–1.20)
GFR: 55.24 mL/min — ABNORMAL LOW (ref >60.00)
Glucose, Bld: 103 mg/dL — ABNORMAL HIGH (ref 70–99)
Potassium: 4.4 mEq/L (ref 3.5–5.1)
Sodium: 141 mEq/L (ref 135–145)

## 2021-06-30 LAB — HEPATIC FUNCTION PANEL
ALT: 13 U/L (ref 0–35)
AST: 16 U/L (ref 0–37)
Albumin: 4.5 g/dL (ref 3.5–5.2)
Alkaline Phosphatase: 126 U/L — ABNORMAL HIGH (ref 39–117)
Bilirubin, Direct: 0.1 mg/dL (ref 0.0–0.3)
Total Bilirubin: 0.5 mg/dL (ref 0.2–1.2)
Total Protein: 7.1 g/dL (ref 6.0–8.3)

## 2021-06-30 LAB — LIPID PANEL
Cholesterol: 249 mg/dL — ABNORMAL HIGH (ref 0–200)
HDL: 59 mg/dL (ref >39.00)
LDL Cholesterol: 161 mg/dL — ABNORMAL HIGH (ref 0–99)
NonHDL: 189.71
Total CHOL/HDL Ratio: 4
Triglycerides: 142 mg/dL (ref 0.0–149.0)
VLDL: 28.4 mg/dL (ref 0.0–40.0)

## 2021-06-30 LAB — HEMOGLOBIN A1C: Hgb A1c MFr Bld: 5.7 % (ref 4.6–6.5)

## 2021-06-30 LAB — TSH: TSH: 4.26 u[IU]/mL (ref 0.35–5.50)

## 2021-06-30 NOTE — Telephone Encounter (Signed)
Lm for Carrie Noble to cb

## 2021-06-30 NOTE — Telephone Encounter (Signed)
Patient came in for lab appointment today and states she would like to have medication for her dry hacking cough and sneezing.  Patient states her symptoms are intermittent and she said she thinks they may be related to her acid reflux.  Patient did not present with symptoms while checking in.  Also, patient states she would like to speak with Dr. Charm Barges nurse again regarding instructions for her estradiol (ESTRACE VAGINAL) 0.1 MG/GM vaginal cream.  Patient states we can call her at home at [phone] or on her cell phone at [phone].  **Patient states [facility] [place].

## 2021-06-30 NOTE — Telephone Encounter (Signed)
Pt returning call

## 2021-07-01 LAB — URINE CULTURE
MICRO NUMBER:: 13495174
SPECIMEN QUALITY:: ADEQUATE

## 2021-07-01 NOTE — Telephone Encounter (Signed)
S/w Cristy - see result note

## 2021-07-01 NOTE — Telephone Encounter (Signed)
Pt returning call

## 2021-07-01 NOTE — Telephone Encounter (Signed)
Pt returning call... Pt is requesting callback... Pt stated its ok to leave a detail message on voicemail.Marland KitchenMarland Kitchen

## 2021-07-02 ENCOUNTER — Other Ambulatory Visit: Payer: Self-pay | Admitting: Internal Medicine

## 2021-07-02 MED ORDER — ATORVASTATIN CALCIUM 10 MG PO TABS
ORAL_TABLET | ORAL | 1 refills | Status: DC
Start: 2021-07-02 — End: 2022-07-19

## 2021-07-02 NOTE — Progress Notes (Signed)
Rx sent in for atorvastatin q Monday, Wednesday and Friday.  #39 with one refill.

## 2021-07-05 NOTE — Progress Notes (Signed)
Urinalysis with micro was released but never sent. If still needed, pt will need to be recollected. I have changed the order to future.

## 2021-07-05 NOTE — Addendum Note (Signed)
Addended by: Clearnce Sorrel on: 07/05/2021 10:37 AM   Modules accepted: Orders

## 2021-07-06 ENCOUNTER — Telehealth: Payer: Self-pay | Admitting: Internal Medicine

## 2021-07-06 NOTE — Telephone Encounter (Signed)
If having increased drainage - can use nasacort nasal spray - 2 sprays each nostril one time per day - do this in the evening.  Also robitussin DM if cough and congestion.  If persistent problems, let us know.

## 2021-07-06 NOTE — Telephone Encounter (Signed)
Pt called in requesting to speak with you... Pt stated that she would like to speak with you about her cholesterol medication... Pt requesting callback.Marland KitchenMarland Kitchen

## 2021-07-06 NOTE — Telephone Encounter (Signed)
S/w pt - stated is having hot flashes since starting estrogen cream. Wanted to know if this is a side effect and if she should be concerned.  Also asking what she can use for congestion - having some issues with allergies, mostly at night.

## 2021-07-06 NOTE — Telephone Encounter (Signed)
Pt advised.

## 2021-08-09 ENCOUNTER — Ambulatory Visit: Payer: Medicare Other | Admitting: Internal Medicine

## 2021-08-13 ENCOUNTER — Ambulatory Visit (INDEPENDENT_AMBULATORY_CARE_PROVIDER_SITE_OTHER): Payer: Medicare Other | Admitting: Internal Medicine

## 2021-08-13 ENCOUNTER — Encounter: Payer: Self-pay | Admitting: Internal Medicine

## 2021-08-13 VITALS — BP 126/80 | HR 70 | Temp 98.4°F | Resp 17 | Ht 63.0 in | Wt 132.0 lb

## 2021-08-13 DIAGNOSIS — K219 Gastro-esophageal reflux disease without esophagitis: Secondary | ICD-10-CM | POA: Diagnosis not present

## 2021-08-13 DIAGNOSIS — K2270 Barrett's esophagus without dysplasia: Secondary | ICD-10-CM

## 2021-08-13 DIAGNOSIS — F439 Reaction to severe stress, unspecified: Secondary | ICD-10-CM

## 2021-08-13 DIAGNOSIS — I700 Atherosclerosis of aorta: Secondary | ICD-10-CM | POA: Diagnosis not present

## 2021-08-13 DIAGNOSIS — N1831 Chronic kidney disease, stage 3a: Secondary | ICD-10-CM

## 2021-08-13 DIAGNOSIS — N9489 Other specified conditions associated with female genital organs and menstrual cycle: Secondary | ICD-10-CM

## 2021-08-13 DIAGNOSIS — R350 Frequency of micturition: Secondary | ICD-10-CM

## 2021-08-13 DIAGNOSIS — E041 Nontoxic single thyroid nodule: Secondary | ICD-10-CM

## 2021-08-13 DIAGNOSIS — R002 Palpitations: Secondary | ICD-10-CM

## 2021-08-13 DIAGNOSIS — D75839 Thrombocytosis, unspecified: Secondary | ICD-10-CM

## 2021-08-13 DIAGNOSIS — E7800 Pure hypercholesterolemia, unspecified: Secondary | ICD-10-CM | POA: Diagnosis not present

## 2021-08-13 DIAGNOSIS — R739 Hyperglycemia, unspecified: Secondary | ICD-10-CM

## 2021-08-13 DIAGNOSIS — N949 Unspecified condition associated with female genital organs and menstrual cycle: Secondary | ICD-10-CM

## 2021-08-13 LAB — HEPATIC FUNCTION PANEL
ALT: 15 U/L (ref 0–35)
AST: 19 U/L (ref 0–37)
Albumin: 4.7 g/dL (ref 3.5–5.2)
Alkaline Phosphatase: 116 U/L (ref 39–117)
Bilirubin, Direct: 0.1 mg/dL (ref 0.0–0.3)
Total Bilirubin: 0.6 mg/dL (ref 0.2–1.2)
Total Protein: 7.3 g/dL (ref 6.0–8.3)

## 2021-08-13 MED ORDER — OMEPRAZOLE 20 MG PO CPDR: 20.0000 mg | DELAYED_RELEASE_CAPSULE | Freq: Two times a day (BID) | ORAL | 1 refills | Status: DC

## 2021-08-13 NOTE — Progress Notes (Unsigned)
Patient ID: Carrie Noble, female   DOB: [birth year], 81 y.o.   MRN: [id number]   Subjective:    Patient ID: Carrie Noble, female    DOB: Jan 27, 1940, 81 y.o.   MRN: [id number]  This visit occurred during the SARS-CoV-2 public health emergency.  Safety protocols were in place, including screening questions prior to the visit, additional usage of staff PPE, and extensive cleaning of exam room while observing appropriate contact time as indicated for disinfecting solutions.   Patient here for  Chief Complaint  Patient presents with   Hypertension   Hyperlipidemia   Gastroesophageal Reflux   .   HPI    Past Medical History:  Diagnosis Date   Allergy    Arthritis    Gastroesophageal reflux    Hiatal hernia 1989   status post Nissen fundoplication    Hx of hysterectomy    Hyperlipidemia    Tachycardia    Patient stated that this has been occuring frequently.   Thyroid disease    Goiter   Past Surgical History:  Procedure Laterality Date   ABDOMINAL HYSTERECTOMY  1980   partial   BREAST BIOPSY Right    neg   BREAST EXCISIONAL BIOPSY Left 2014   neg   COLONOSCOPY  2015   ESOPHAGOGASTRIC FUNDOPLICATION  1999   LAPAROSCOPIC NISSEN FUNDOPLICATION  1999   TONSILLECTOMY     as well as goiter   UPPER GI ENDOSCOPY  2015   Family History  Problem Relation Age of Onset   Stroke Mother 106   Arthritis Mother    Heart disease Mother    Hypertension Mother    Stroke Father 74   Hypertension Father    Rheumatic fever Brother        and multiple open heart surgeries   Diabetes Brother        type 2   Stroke Brother    Other Sister        Coronary Atherosclerosis   Stroke Brother    Stroke Sister    Heart disease Sister    Dementia Sister    Cancer Sister        ovarian   Breast cancer Neg Hx    Social History   Socioeconomic History   Marital status: Married    Spouse name: Not on file   Number of children: Not on file   Years of education: Not on file   Highest  education level: Not on file  Occupational History   Occupation: Retired    Associate Professor: OTHER  Tobacco Use   Smoking status: Never   Smokeless tobacco: Never  Substance and Sexual Activity   Alcohol use: No    Alcohol/week: 0.0 standard drinks of alcohol   Drug use: No   Sexual activity: Never  Other Topics Concern   Not on file  Social History Narrative   Occasionally drinks coffee.   Social Determinants of Health   Financial Resource Strain: Low Risk  (06/17/2021)   Overall Financial Resource Strain (CARDIA)    Difficulty of Paying Living Expenses: Not hard at all  Food Insecurity: No Food Insecurity (06/17/2021)   Hunger Vital Sign    Worried About Running Out of Food in the Last Year: Never true    Ran Out of Food in the Last Year: Never true  Transportation Needs: No Transportation Needs (06/17/2021)   PRAPARE - Administrator, Civil Service (Medical): No    Lack of Transportation (Non-Medical): No  Physical Activity: Insufficiently Active (06/17/2021)   Exercise Vital Sign    Days of Exercise per Week: 7 days    Minutes of Exercise per Session: 20 min  Stress: No Stress Concern Present (06/17/2021)   [employer] of Occupational Health - Occupational Stress Questionnaire    Feeling of Stress : Not at all  Social Connections: Unknown (06/09/2020)   Social Connection and Isolation Panel [NHANES]    Frequency of Communication with Friends and Family: Not on file    Frequency of Social Gatherings with Friends and Family: Not on file    Attends Religious Services: Not on file    Active Member of Clubs or Organizations: Not on file    Attends Engineer, structural: Not on file    Marital Status: Married     Review of Systems     Objective:     BP 126/80 (BP Location: Left Arm, Patient Position: Sitting, Cuff Size: Small)   Pulse 70   Temp 98.4 F (36.9 C) (Temporal)   Resp 17   Ht 5\' 3"  (1.6 m)   Wt 132 lb (59.9 kg)   SpO2 98%   BMI 23.38  kg/m  Wt Readings from Last 3 Encounters:  08/13/21 132 lb (59.9 kg)  06/17/21 135 lb (61.2 kg)  06/09/21 135 lb 12.8 oz (61.6 kg)    Physical Exam   Outpatient Encounter Medications as of 08/13/2021  Medication Sig   atorvastatin (LIPITOR) 10 MG tablet One tablet q Monday, Wednesday and Friday.   Azelastine HCl 137 MCG/SPRAY SOLN PLACE 1 SPRAY INTO BOTH NOSTRILS 2 (TWO) TIMES DAILY. USE IN EACH NOSTRIL AS DIRECTED   Biotin 1000 MCG tablet Take 1,000 mcg by mouth 3 (three) times daily. Reported on 05/13/2015   Calcium Carbonate (CALCIUM 600 PO) Take by mouth daily. Reported on 05/13/2015   Cholecalciferol (VITAMIN D PO) Take by mouth daily. Reported on 05/13/2015   estradiol (ESTRACE VAGINAL) 0.1 MG/GM vaginal cream Apply one applicator q hs x 5 nights and then one applicator 2x week.   hydrocortisone 2.5 % cream    loratadine (CLARITIN) 5 MG chewable tablet Chew 1 tablet (5 mg total) by mouth daily.   polyethylene glycol (MIRALAX) 17 g packet Take 17 g by mouth daily.   vitamin B-12 (CYANOCOBALAMIN) 1000 MCG tablet Take 1,000 mcg by mouth daily. Reported on 05/13/2015   [DISCONTINUED] Bromfenac Sodium 0.09 % SOLN Apply to eye.   [DISCONTINUED] famotidine (PEPCID) 20 MG tablet Take 20 mg by mouth at bedtime.   [DISCONTINUED] omeprazole (PRILOSEC) 20 MG capsule Take 1 capsule (20 mg total) by mouth daily.   [DISCONTINUED] sertraline (ZOLOFT) 25 MG tablet Take 1 tablet (25 mg total) by mouth daily.   [DISCONTINUED] triamcinolone (KENALOG) 0.1 % paste SMARTSIG:Sparingly TO TEETH Twice Daily   omeprazole (PRILOSEC) 20 MG capsule Take 1 capsule (20 mg total) by mouth 2 (two) times daily before a meal.   Facility-Administered Encounter Medications as of 08/13/2021  Medication   betamethasone acetate-betamethasone sodium phosphate (CELESTONE) injection 3 mg   betamethasone acetate-betamethasone sodium phosphate (CELESTONE) injection 3 mg     Lab Results  Component Value Date   WBC 7.4  06/30/2021   HGB 13.2 06/30/2021   HCT 38.5 06/30/2021   PLT 368.0 06/30/2021   GLUCOSE 103 (H) 06/30/2021   CHOL 249 (H) 06/30/2021   TRIG 142.0 06/30/2021   HDL 59.00 06/30/2021   LDLDIRECT 141.0 09/01/2016   LDLCALC 161 (H) 06/30/2021   ALT 13  06/30/2021   AST 16 06/30/2021   NA 141 06/30/2021   K 4.4 06/30/2021   CL 103 06/30/2021   CREATININE 0.96 06/30/2021   BUN 17 06/30/2021   CO2 28 06/30/2021   TSH 4.26 06/30/2021   HGBA1C 5.7 06/30/2021       Assessment & Plan:   Problem List Items Addressed This Visit     Hypercholesterolemia - Primary   Relevant Orders   Hepatic function panel     Einar Pheasant, MD

## 2021-08-13 NOTE — Progress Notes (Unsigned)
Patient ID: Carrie Noble, female   DOB: 06-Apr-1940, 81 y.o.   MRN: [id number]

## 2021-08-13 NOTE — Patient Instructions (Signed)
Take prilosec 30 minutes before breakfast and 30 minutes before your evening meal

## 2021-08-14 ENCOUNTER — Encounter: Payer: Self-pay | Admitting: Internal Medicine

## 2021-08-14 MED ORDER — GEMTESA 75 MG PO TABS: 75.0000 mg | ORAL_TABLET | Freq: Every day | ORAL | 2 refills | Status: DC

## 2021-08-14 NOTE — Assessment & Plan Note (Signed)
Continue to avoid antiinflammatories.  Stay hydrated.  Follow.  Follow metabolic panel.  

## 2021-08-14 NOTE — Assessment & Plan Note (Signed)
Previously evaluated by Dr Troy Sine.  Recommended protonix and pepcid.  She had been taking omeprazole daily.  With break through symptoms.  Did not feel pepcid helped.  Increase omeprazole to bid.  Follow.

## 2021-08-14 NOTE — Assessment & Plan Note (Signed)
Previously evaluated by Dr Medhoff.  Recommended protonix and pepcid.  She had been taking omeprazole daily.  With break through symptoms.  Did not feel pepcid helped.  Increase omeprazole to bid.  Follow.  

## 2021-08-14 NOTE — Assessment & Plan Note (Signed)
Persistent increased urinary frequency.  Has seen urology.  Was given trial of gemtesa.  Helped.  Request prescription.  Follow.

## 2021-08-14 NOTE — Assessment & Plan Note (Signed)
Continue lipitor.  Low cholesterol diet and exercise.  Follow lipid panel and liver function tests.   

## 2021-08-14 NOTE — Assessment & Plan Note (Signed)
Saw cardiology.  Discussed magnesium.

## 2021-08-14 NOTE — Assessment & Plan Note (Signed)
Continue lipitor  ?

## 2021-08-14 NOTE — Assessment & Plan Note (Signed)
Follow [facility]bc.  

## 2021-08-14 NOTE — Assessment & Plan Note (Signed)
Low carb diet and exercise.  Follow met b and a1c.  

## 2021-08-14 NOTE — Assessment & Plan Note (Signed)
Estrogen cream helping.  Decrease to 2x/week.

## 2021-08-14 NOTE — Assessment & Plan Note (Signed)
-  Continue zoloft  

## 2021-08-14 NOTE — Assessment & Plan Note (Signed)
Saw Dr O'Connell 06/10/21 - recommended f/u ultrasound in 2 years.   

## 2021-08-20 ENCOUNTER — Telehealth: Payer: Self-pay | Admitting: Internal Medicine

## 2021-08-20 ENCOUNTER — Telehealth: Payer: Self-pay

## 2021-08-20 NOTE — Telephone Encounter (Signed)
Make sure staying hydrated.  Confirm no diarrhea,or anything going on that would make her more volume depleted.  Need to know if she develops any dizziness, light headedness, etc.  Continue to keep [place] posted.

## 2021-08-20 NOTE — Telephone Encounter (Signed)
-----   Message from Dale Comerio, MD sent at 08/14/2021 11:04 PM EDT ----- Notify - liver panel wnl.  Also notify her that I have sent in a prescription for gemtesa for her bladder issues.  (Not sure of cost to her, but did send in rx).

## 2021-08-20 NOTE — Telephone Encounter (Signed)
Pt called back and I read message to her and she said the medication was over a hundred dollars for ninety pills. Pt will try the medication-gemtesa and see if what she can do with those

## 2021-08-20 NOTE — Telephone Encounter (Signed)
Pt picked up gemtesa - started medication

## 2021-08-20 NOTE — Telephone Encounter (Signed)
S/w pt - confirmed  no diarrhea , no dizziness, light headedness, faint feeling. Pt stated she feels fine, probably not drinking enough. Will increase hydration to see if that helps.  Will let me know if she develops any sx - dizziness, light headedness, faint feeling, lower blood pressure.

## 2021-08-20 NOTE — Telephone Encounter (Signed)
LMTCB in regards to lab results.  

## 2021-08-20 NOTE — Telephone Encounter (Signed)
  S/w pt - stated had one episode of "low bp" last week - was 107/60. Blood pressure since had been fine, around 130's/70's. This morning when she checked it, it was 104/67.  Pt denies light headed, dizzy, faint feeling. States feels fine, no issues, just wondering what would make her bp drop at random times like that. Stated she is not too concerned since she feels fine, just wanted to make you aware in case there is something she has to do.

## 2021-08-20 NOTE — Telephone Encounter (Signed)
Please clarify with pt.  I am not sure if I understand the message.  Is she going to try gemtesa or is it too expensive.

## 2021-08-20 NOTE — Telephone Encounter (Signed)
Pt called in stating that she is concerned about her blood pressure... Pt stated that her blood pressure has been normal and low at times... Pt was wondering what can cause her blood pressure to drop... Pt stated that last week her blood pressure was running low... Pt didn't provider reading... Pt stated that this week her blood pressure was normal around 130/70.... Pt stated that this morning blood pressure was 104/67... Pt stated that she not in any trouble but would like to know whats causing her BP to drop.... Pt requesting callback

## 2021-08-25 ENCOUNTER — Encounter: Payer: Self-pay | Admitting: Internal Medicine

## 2021-08-25 ENCOUNTER — Ambulatory Visit (INDEPENDENT_AMBULATORY_CARE_PROVIDER_SITE_OTHER): Payer: Medicare Other | Admitting: Internal Medicine

## 2021-08-25 VITALS — BP 134/78 | HR 72 | Temp 97.8°F | Ht 63.0 in | Wt 133.8 lb

## 2021-08-25 DIAGNOSIS — H9202 Otalgia, left ear: Secondary | ICD-10-CM | POA: Diagnosis not present

## 2021-08-25 DIAGNOSIS — H04123 Dry eye syndrome of bilateral lacrimal glands: Secondary | ICD-10-CM | POA: Diagnosis not present

## 2021-08-25 DIAGNOSIS — J029 Acute pharyngitis, unspecified: Secondary | ICD-10-CM

## 2021-08-25 MED ORDER — AZITHROMYCIN 250 MG PO TABS: ORAL_TABLET | ORAL | 0 refills | Status: AC

## 2021-08-25 MED ORDER — CIPROFLOXACIN-DEXAMETHASONE 0.3-0.1 % OT SUSP: 4.0000 [drp] | Freq: Two times a day (BID) | OTIC | 0 refills | Status: DC

## 2021-08-25 NOTE — Patient Instructions (Addendum)
[facility]  Call daily for cancellations   Phone Fax E-mail Address  [phone] Not available [address]    [place] [address]     Specialties     Ophthalmology      Lymphadenopathy  Lymphadenopathy means that your lymph glands are swollen or larger than normal. Lymph glands, also called lymph nodes, are collections of tissue that filter excess fluid, bacteria, viruses, and waste from your bloodstream. They are part of your body's disease-fighting system (immune system), which protects your body from germs. There may be different causes of lymphadenopathy, depending on where it is in your body. Some types go away on their own. Lymphadenopathy can occur anywhere that you have lymph glands, including these areas: Neck (cervical lymphadenopathy). Chest (mediastinal lymphadenopathy). Lungs (hilar lymphadenopathy). Underarms (axillary lymphadenopathy). Groin (inguinal lymphadenopathy). When your immune system responds to germs, infection-fighting cells and fluid build up in your lymph glands. This causes some swelling and enlargement. If the lymph nodes do not go back to normal size after you have an infection or disease, your health care provider may do tests. These tests help to monitor your condition and find the reason why the glands are still swollen and enlarged. Follow these instructions at home:  Get plenty of rest. Your health care provider may recommend over-the-counter medicines for pain. Take over-the-counter and prescription medicines only as told by your health care provider. If directed, apply heat to swollen lymph glands as often as told by your health care provider. Use the heat source that your health care provider recommends, such as a moist heat pack or a heating pad. Place a towel between your skin and the heat source. Leave the heat on for 20-30 minutes. Remove the heat if your skin turns bright red. This is especially important if you are  unable to feel pain, heat, or cold. You may have a greater risk of getting burned. Check your affected lymph glands every day for changes. Check other lymph gland areas as told by your health care provider. Check for changes such as: More swelling. Sudden increase in size. Redness or pain. Hardness. Keep all follow-up visits. This is important. Contact a health care provider if you have: Lymph glands that: Are still swollen after 2 weeks. Have suddenly gotten bigger or the swelling spreads. Are red, painful, or hard. Fluid leaking from the skin near an enlarged lymph gland. Problems with breathing. A fever, chills, or night sweats. Fatigue. A sore throat. Pain in your abdomen. Weight loss. Get help right away if you have: Severe pain. Chest pain. Shortness of breath. These symptoms may represent a serious problem that is an emergency. Do not wait to see if the symptoms will go away. Get medical help right away. Call your local emergency services (911 in the [place] Do not drive yourself to the hospital. Summary Lymphadenopathy means that your lymph glands are swollen or larger than normal. Lymph glands, also called lymph nodes, are collections of tissue that filter excess fluid, bacteria, viruses, and waste from the bloodstream. They are part of your body's disease-fighting system (immune system). Lymphadenopathy can occur anywhere that you have lymph glands. If the lymph nodes do not go back to normal size after you have an infection or disease, your health care provider may do tests to monitor your condition and find the reason why the glands are still swollen and enlarged. Check your affected lymph glands every day for changes. Check other lymph gland areas as told by your  health care provider. This information is not intended to replace advice given to you by your health care provider. Make sure you discuss any questions you have with your health care provider. Document Revised:  11/06/2019 Document Reviewed: 11/06/2019 Elsevier Patient Education  2023 [employer]  Otitis Media, Adult  Otitis media occurs when there is inflammation and fluid in the middle ear with signs and symptoms of an acute infection. The middle ear is a part of the ear that contains bones for hearing as well as air that helps send sounds to the brain. When infected fluid builds up in this space, it causes pressure and can lead to an ear infection. The eustachian tube connects the middle ear to the back of the nose (nasopharynx) and normally allows air into the middle ear. If the eustachian tube becomes blocked, fluid can build up and become infected. What are the causes? This condition is caused by a blockage in the eustachian tube. This can be caused by mucus or by swelling of the tube. Problems that can cause a blockage include: A cold or other upper respiratory infection. Allergies. An irritant, such as tobacco smoke. Enlarged adenoids. The adenoids are areas of soft tissue located high in the back of the throat, behind the nose and the roof of the mouth. They are part of the body's defense system (immune system). A mass in the nasopharynx. Damage to the ear caused by pressure changes (barotrauma). What increases the risk? You are more likely to develop this condition if you: Smoke or are exposed to tobacco smoke. Have an opening in the roof of your mouth (cleft palate). Have gastroesophageal reflux. Have an immune system disorder. What are the signs or symptoms? Symptoms of this condition include: Ear pain. Fever. Decreased hearing. Tiredness (lethargy). Fluid leaking from the ear, if the eardrum is ruptured or has burst. Ringing in the ear. How is this diagnosed?  This condition is diagnosed with a physical exam. During the exam, your health care provider will use an instrument called an otoscope to look in your ear and check for redness, swelling, and fluid. He or she will also ask  about your symptoms. Your health care provider may also order tests, such as: A pneumatic otoscopy. This is a test to check the movement of the eardrum. It is done by squeezing a small amount of air into the ear. A tympanogram. This is a test that shows how well the eardrum moves in response to air pressure in the ear canal. It provides a graph for your health care provider to review. How is this treated? This condition can go away on its own within 3-5 days. But if the condition is caused by a bacterial infection and does not go away on its own, or if it keeps coming back, your health care provider may: Prescribe antibiotic medicine to treat the infection. Prescribe or recommend medicines to control pain. Follow these instructions at home: Take over-the-counter and prescription medicines only as told by your health care provider. If you were prescribed an antibiotic medicine, take it as told by your health care provider. Do not stop taking the antibiotic even if you start to feel better. Keep all follow-up visits. This is important. Contact a health care provider if: You have bleeding from your nose. There is a lump on your neck. You are not feeling better in 5 days. You feel worse instead of better. Get help right away if: You have severe pain that is not controlled  with medicine. You have swelling, redness, or pain around your ear. You have stiffness in your neck. A part of your face is not moving (paralyzed). The bone behind your ear (mastoid bone) is tender when you touch it. You develop a severe headache. Summary Otitis media is redness, soreness, and swelling of the middle ear, usually resulting in pain and decreased hearing. This condition can go away on its own within 3-5 days. If the problem does not go away in 3-5 days, your health care provider may give you medicines to treat the infection. If you were prescribed an antibiotic medicine, take it as told by your health care  provider. Follow all instructions that were given to you by your health care provider. This information is not intended to replace advice given to you by your health care provider. Make sure you discuss any questions you have with your health care provider. Document Revised: 04/20/2020 Document Reviewed: 04/20/2020 Elsevier Patient Education  2023 [employer]  Sore Throat A sore throat is pain, burning, irritation, or scratchiness in the throat. When you have a sore throat, you may feel pain or tenderness in your throat when you swallow or talk. Many things can cause a sore throat, including: An infection. Seasonal allergies. Dryness in the air. Irritants, such as smoke or pollution. Radiation treatment for cancer. Gastroesophageal reflux disease (GERD). A tumor. A sore throat is often the first sign of another sickness. It may happen with other symptoms, such as coughing, sneezing, fever, and swollen neck glands. Most sore throats go away without medical treatment. Follow these instructions at home:     Medicines Take over-the-counter and prescription medicines only as told by your health care provider. Children often get sore throats. Do not give your child aspirin because of the association with Reye's syndrome. Use throat sprays to soothe your throat as told by your health care provider. Managing pain To help with pain, try: Sipping warm liquids, such as broth, herbal tea, or warm water. Eating or drinking cold or frozen liquids, such as frozen ice pops. Gargling with a mixture of salt and water 3-4 times a day or as needed. To make salt water, completely dissolve -1 tsp (3-6 g) of salt in 1 cup (237 mL) of warm water. Sucking on hard candy or throat lozenges. Putting a cool-mist humidifier in your bedroom at night to moisten the air. Sitting in the bathroom with the door closed for 5-10 minutes while you run hot water in the shower. General instructions Do not use any  products that contain nicotine or tobacco. These products include cigarettes, chewing tobacco, and vaping devices, such as e-cigarettes. If you need help quitting, ask your health care provider. Rest as needed. Drink enough fluid to keep your urine pale yellow. Wash your hands often with soap and water for at least 20 seconds. If soap and water are not available, use hand sanitizer. Contact a health care provider if: You have a fever for more than 2-3 days. You have symptoms that last for more than 2-3 days. Your throat does not get better within 7 days. You have a fever and your symptoms suddenly get worse. Get help right away if: You have difficulty breathing. You cannot swallow fluids, soft foods, or your saliva. You have increased swelling in your throat or neck. You have persistent nausea and vomiting. These symptoms may represent a serious problem that is an emergency. Do not wait to see if the symptoms will go away. Get medical help  right away. Call your local emergency services (911 in the [place] Do not drive yourself to the hospital. Summary A sore throat is pain, burning, irritation, or scratchiness in the throat. Many things can cause a sore throat. Take over-the-counter medicines only as told by your health care provider. Rest as needed. Drink enough fluid to keep your urine pale yellow. Contact a health care provider if your throat does not get better within 7 days. This information is not intended to replace advice given to you by your health care provider. Make sure you discuss any questions you have with your health care provider. Document Revised: 04/08/2020 Document Reviewed: 04/08/2020 Elsevier Patient Education  [employer].

## 2021-08-25 NOTE — Progress Notes (Signed)
Chief Complaint  Patient presents with   ear pain    Stabbing pain in left ear since yesterday. Has taken 81 mg aspirin that's helped pain.    Acute  Left sharp ear pain throbbing severe last night and area in front of ear was sore and having sore throat and left cheek pressure tried aspirin 81 mg qd pain was so bad could feel heart beating in her ear   Dry eyes instead of going to [place] wants to go back to AE referral placed   Review of Systems  Constitutional:  Negative for weight loss.  HENT:  Positive for ear pain and sore throat. Negative for hearing loss.   Eyes:  Negative for blurred vision.  Respiratory:  Negative for shortness of breath.   Cardiovascular:  Negative for chest pain.  Gastrointestinal:  Negative for abdominal pain and blood in stool.  Genitourinary:  Negative for dysuria.  Musculoskeletal:  Negative for falls and joint pain.  Skin:  Negative for rash.  Neurological:  Negative for headaches.  Psychiatric/Behavioral:  Negative for depression.    Past Medical History:  Diagnosis Date   Allergy    Arthritis    Gastroesophageal reflux    Hiatal hernia 1989   status post Nissen fundoplication    Hx of hysterectomy    Hyperlipidemia    Tachycardia    Patient stated that this has been occuring frequently.   Thyroid disease    Goiter   Past Surgical History:  Procedure Laterality Date   ABDOMINAL HYSTERECTOMY  1980   partial   blepheroplasty b/l eyes Bilateral    BREAST BIOPSY Right    neg   BREAST EXCISIONAL BIOPSY Left 2014   neg   COLONOSCOPY  2015   ESOPHAGOGASTRIC FUNDOPLICATION  1999   LAPAROSCOPIC NISSEN FUNDOPLICATION  1999   TONSILLECTOMY     as well as goiter   UPPER GI ENDOSCOPY  2015   Family History  Problem Relation Age of Onset   Stroke Mother 65   Arthritis Mother    Heart disease Mother    Hypertension Mother    Stroke Father 5   Hypertension Father    Rheumatic fever Brother        and multiple open heart surgeries    Diabetes Brother        type 2   Stroke Brother    Other Sister        Coronary Atherosclerosis   Stroke Brother    Stroke Sister    Heart disease Sister    Dementia Sister    Cancer Sister        ovarian   Breast cancer Neg Hx    Social History   Socioeconomic History   Marital status: Married    Spouse name: Not on file   Number of children: Not on file   Years of education: Not on file   Highest education level: Not on file  Occupational History   Occupation: Retired    Associate Professor: OTHER  Tobacco Use   Smoking status: Never   Smokeless tobacco: Never  Substance and Sexual Activity   Alcohol use: No    Alcohol/week: 0.0 standard drinks of alcohol   Drug use: No   Sexual activity: Never  Other Topics Concern   Not on file  Social History Narrative   Occasionally drinks coffee.   Social Determinants of Health   Financial Resource Strain: Low Risk  (06/17/2021)   Overall Financial Resource Strain (CARDIA)  Difficulty of Paying Living Expenses: Not hard at all  Food Insecurity: No Food Insecurity (06/17/2021)   Hunger Vital Sign    Worried About Running Out of Food in the Last Year: Never true    Ran Out of Food in the Last Year: Never true  Transportation Needs: No Transportation Needs (06/17/2021)   PRAPARE - Hydrologist (Medical): No    Lack of Transportation (Non-Medical): No  Physical Activity: Insufficiently Active (06/17/2021)   Exercise Vital Sign    Days of Exercise per Week: 7 days    Minutes of Exercise per Session: 20 min  Stress: No Stress Concern Present (06/17/2021)   [address]    Feeling of Stress : Not at all  Social Connections: Unknown (06/09/2020)   Social Connection and Isolation Panel [NHANES]    Frequency of Communication with Friends and Family: Not on file    Frequency of Social Gatherings with Friends and Family: Not on file    Attends  Religious Services: Not on file    Active Member of Clubs or Organizations: Not on file    Attends Archivist Meetings: Not on file    Marital Status: Married  Intimate Partner Violence: Not At Risk (06/17/2021)   Humiliation, Afraid, Rape, and Kick questionnaire    Fear of Current or Ex-Partner: No    Emotionally Abused: No    Physically Abused: No    Sexually Abused: No   Current Meds  Medication Sig   atorvastatin (LIPITOR) 10 MG tablet One tablet q Monday, Wednesday and Friday.   Azelastine HCl 137 MCG/SPRAY SOLN PLACE 1 SPRAY INTO BOTH NOSTRILS 2 (TWO) TIMES DAILY. USE IN EACH NOSTRIL AS DIRECTED   azithromycin (ZITHROMAX) 250 MG tablet With food Take 2 tablets on day 1, then 1 tablet daily on days 2 through 5   Biotin 1000 MCG tablet Take 1,000 mcg by mouth 3 (three) times daily. Reported on 05/13/2015   Calcium Carbonate (CALCIUM 600 PO) Take by mouth daily. Reported on 05/13/2015   Cholecalciferol (VITAMIN D PO) Take by mouth daily. Reported on 05/13/2015   ciprofloxacin-dexamethasone (CIPRODEX) OTIC suspension Place 4 drops into the left ear 2 (two) times daily. X4-7 days   estradiol (ESTRACE VAGINAL) 0.1 MG/GM vaginal cream Apply one applicator q hs x 5 nights and then one applicator 2x week.   hydrocortisone 2.5 % cream    loratadine (CLARITIN) 5 MG chewable tablet Chew 1 tablet (5 mg total) by mouth daily.   Magnesium Oxide 250 MG TABS Take 1 tablet by mouth daily.   omeprazole (PRILOSEC) 20 MG capsule Take 1 capsule (20 mg total) by mouth 2 (two) times daily before a meal.   polyethylene glycol (MIRALAX) 17 g packet Take 17 g by mouth daily.   Vibegron (GEMTESA) 75 MG TABS Take 75 mg by mouth daily.   vitamin B-12 (CYANOCOBALAMIN) 1000 MCG tablet Take 1,000 mcg by mouth daily. Reported on 05/13/2015   Current Facility-Administered Medications for the 08/25/21 encounter (Office Visit) with McLean-Scocuzza, Nino Glow, MD  Medication   betamethasone acetate-betamethasone  sodium phosphate (CELESTONE) injection 3 mg   betamethasone acetate-betamethasone sodium phosphate (CELESTONE) injection 3 mg   Allergies  Allergen Reactions   Darvocet [Propoxyphene N-Acetaminophen]     Patient stated that medication made her heart beat fast.   Morphine And Related     Patient stated that medication made her heart beat fast.   Sucralfate  Nausea Only    congestion   Recent Results (from the past 2160 hour(s))  POCT Urinalysis Dipstick     Status: Abnormal   Collection Time: 06/09/21 10:55 AM  Result Value Ref Range   Color, UA yellow    Clarity, UA clear    Glucose, UA Negative Negative   Bilirubin, UA neg    Ketones, UA 5    Spec Grav, UA 1.010 1.010 - 1.025   Blood, UA neg    pH, UA 7.0 5.0 - 8.0   Protein, UA Negative Negative   Urobilinogen, UA 0.2 0.2 or 1.0 E.U./dL   Nitrite, UA neg    Leukocytes, UA Small (1+) (A) Negative   Appearance yellow    Odor none   Cervicovaginal ancillary only( [place])     Status: None   Collection Time: 06/09/21 11:54 AM  Result Value Ref Range   Neisseria Gonorrhea Negative    Chlamydia Negative    Trichomonas Negative    Bacterial Vaginitis (gardnerella) Negative    Candida Vaginitis Negative    Candida Glabrata Negative    Comment      Normal Reference Range Bacterial Vaginosis - Negative   Comment Normal Reference Range Candida Species - Negative    Comment Normal Reference Range Candida Galbrata - Negative    Comment Normal Reference Range Trichomonas - Negative    Comment Normal Reference Ranger Chlamydia - Negative    Comment      Normal Reference Range Neisseria Gonorrhea - Negative  Urine Culture     Status: None   Collection Time: 06/09/21 11:57 AM   Specimen: Urine  Result Value Ref Range   MICRO NUMBER: [id number]    SPECIMEN QUALITY: Adequate    Sample Source NOT GIVEN    STATUS: FINAL    Result: No Growth   Urine Microscopic Only     Status: None   Collection Time: 06/09/21 11:57 AM  Result  Value Ref Range   WBC, UA 0-2/hpf 0-2/hpf   RBC / HPF 0-2/hpf 0-2/hpf   Squamous Epithelial / LPF Rare(0-4/hpf) Rare(0-4/hpf)  Lipid panel     Status: Abnormal   Collection Time: 06/30/21  8:11 AM  Result Value Ref Range   Cholesterol 249 (H) 0 - 200 mg/dL    Comment: ATP III Classification       Desirable:  < 200 mg/dL               Borderline High:  200 - 239 mg/dL          High:  > = 240 mg/dL   Triglycerides 142.0 0.0 - 149.0 mg/dL    Comment: Normal:  <150 mg/dLBorderline High:  150 - 199 mg/dL   HDL 59.00 >39.00 mg/dL   VLDL 28.4 0.0 - 40.0 mg/dL   LDL Cholesterol 161 (H) 0 - 99 mg/dL   Total CHOL/HDL Ratio 4     Comment:                Men          Women1/2 Average Risk     3.4          3.3Average Risk          5.0          4.42X Average Risk          9.6          7.13X Average Risk          15.0  11.0                       NonHDL 189.71     Comment: NOTE:  Non-HDL goal should be 30 mg/dL higher than patient's LDL goal (i.e. LDL goal of < 70 mg/dL, would have non-HDL goal of < 100 mg/dL)  Hepatic function panel     Status: Abnormal   Collection Time: 06/30/21  8:11 AM  Result Value Ref Range   Total Bilirubin 0.5 0.2 - 1.2 mg/dL   Bilirubin, Direct 0.1 0.0 - 0.3 mg/dL   Alkaline Phosphatase 126 (H) 39 - 117 U/L   AST 16 0 - 37 U/L   ALT 13 0 - 35 U/L   Total Protein 7.1 6.0 - 8.3 g/dL   Albumin 4.5 3.5 - 5.2 g/dL  Basic metabolic panel     Status: Abnormal   Collection Time: 06/30/21  8:11 AM  Result Value Ref Range   Sodium 141 135 - 145 mEq/L   Potassium 4.4 3.5 - 5.1 mEq/L   Chloride 103 96 - 112 mEq/L   CO2 28 19 - 32 mEq/L   Glucose, Bld 103 (H) 70 - 99 mg/dL   BUN 17 6 - 23 mg/dL   Creatinine, Ser 0.73 0.40 - 1.20 mg/dL   GFR 71.06 (L) >26.94 mL/min    Comment: Calculated using the CKD-EPI Creatinine Equation (2021)   Calcium 9.7 8.4 - 10.5 mg/dL  CBC with Differential/Platelet     Status: None   Collection Time: 06/30/21  8:11 AM  Result Value Ref  Range   WBC 7.4 4.0 - 10.5 K/uL   RBC 4.14 3.87 - 5.11 Mil/uL   Hemoglobin 13.2 12.0 - 15.0 g/dL   HCT 85.4 62.7 - 03.5 %   MCV 93.0 78.0 - 100.0 fl   MCHC 34.2 30.0 - 36.0 g/dL   RDW 00.9 38.1 - 82.9 %   Platelets 368.0 150.0 - 400.0 K/uL   Neutrophils Relative % 59.1 43.0 - 77.0 %   Lymphocytes Relative 29.5 12.0 - 46.0 %   Monocytes Relative 7.3 3.0 - 12.0 %   Eosinophils Relative 3.4 0.0 - 5.0 %   Basophils Relative 0.7 0.0 - 3.0 %   Neutro Abs 4.4 1.4 - 7.7 K/uL   Lymphs Abs 2.2 0.7 - 4.0 K/uL   Monocytes Absolute 0.5 0.1 - 1.0 K/uL   Eosinophils Absolute 0.3 0.0 - 0.7 K/uL   Basophils Absolute 0.1 0.0 - 0.1 K/uL  Hemoglobin A1c     Status: None   Collection Time: 06/30/21  8:11 AM  Result Value Ref Range   Hgb A1c MFr Bld 5.7 4.6 - 6.5 %    Comment: Glycemic Control Guidelines for People with Diabetes:Non Diabetic:  <6%Goal of Therapy: <7%Additional Action Suggested:  >8%   Urine Culture     Status: None   Collection Time: 06/30/21  8:11 AM   Specimen: Urine  Result Value Ref Range   MICRO NUMBER: [id number]    SPECIMEN QUALITY: Adequate    Sample Source NOT GIVEN    STATUS: FINAL    ISOLATE 1:      Mixed genital flora isolated. These superficial bacteria are not indicative of a urinary tract infection. No further organism identification is warranted on this specimen. If clinically indicated, recollect clean-catch, mid-stream urine and transfer  immediately to Urine Culture Transport Tube.   TSH     Status: None   Collection Time: 06/30/21  8:11 AM  Result Value Ref Range   TSH 4.26 0.35 - 5.50 uIU/mL  Hepatic function panel     Status: None   Collection Time: 08/13/21 10:10 AM  Result Value Ref Range   Total Bilirubin 0.6 0.2 - 1.2 mg/dL   Bilirubin, Direct 0.1 0.0 - 0.3 mg/dL   Alkaline Phosphatase 116 39 - 117 U/L   AST 19 0 - 37 U/L   ALT 15 0 - 35 U/L   Total Protein 7.3 6.0 - 8.3 g/dL   Albumin 4.7 3.5 - 5.2 g/dL   Objective  Body mass index is 23.7  kg/m. Wt Readings from Last 3 Encounters:  08/25/21 133 lb 12.8 oz (60.7 kg)  08/13/21 132 lb (59.9 kg)  06/17/21 135 lb (61.2 kg)   Temp Readings from Last 3 Encounters:  08/25/21 97.8 F (36.6 C) (Oral)  08/13/21 98.4 F (36.9 C) (Temporal)  06/09/21 97.6 F (36.4 C) (Temporal)   BP Readings from Last 3 Encounters:  08/25/21 134/78  08/13/21 126/80  06/09/21 134/68   Pulse Readings from Last 3 Encounters:  08/25/21 72  08/13/21 70  06/09/21 68    Physical Exam Vitals and nursing note reviewed.  Constitutional:      Appearance: Normal appearance. She is well-developed and well-groomed.  HENT:     Head: Normocephalic and atraumatic.     Left Ear: Tympanic membrane is erythematous.     Nose:     Left Sinus: Maxillary sinus tenderness present.     Mouth/Throat:     Pharynx: Posterior oropharyngeal erythema present.  Eyes:     Conjunctiva/sclera: Conjunctivae normal.     Pupils: Pupils are equal, round, and reactive to light.  Cardiovascular:     Rate and Rhythm: Normal rate and regular rhythm.     Heart sounds: Normal heart sounds. No murmur heard. Pulmonary:     Effort: Pulmonary effort is normal.     Breath sounds: Normal breath sounds.  Abdominal:     General: Abdomen is flat. Bowel sounds are normal.     Tenderness: There is no abdominal tenderness.  Musculoskeletal:        General: No tenderness.  Skin:    General: Skin is warm and dry.  Neurological:     General: No focal deficit present.     Mental Status: She is alert and oriented to person, place, and time. Mental status is at baseline.     Cranial Nerves: Cranial nerves 2-12 are intact.     Motor: Motor function is intact.     Coordination: Coordination is intact.     Gait: Gait is intact.  Psychiatric:        Attention and Perception: Attention and perception normal.        Mood and Affect: Mood and affect normal.        Speech: Speech normal.        Behavior: Behavior normal. Behavior is  cooperative.        Thought Content: Thought content normal.        Cognition and Memory: Cognition and memory normal.        Judgment: Judgment normal.     Assessment  Plan  Left ear pain - Plan: ciprofloxacin-dexamethasone (CIPRODEX) OTIC suspension Sore throat - Plan: azithromycin (ZITHROMAX) 250 MG tablet   Dry eye syndrome of both eyes - Plan: Ambulatory referral to Ophthalmology AE   F/u as sch with PCP  Provider: Dr. Olivia Mackie McLean-Scocuzza-Internal Medicine

## 2021-08-31 ENCOUNTER — Telehealth: Payer: Self-pay | Admitting: Internal Medicine

## 2021-08-31 NOTE — Telephone Encounter (Signed)
Any other symptoms?  If persistent pain, should be reevaluated.

## 2021-08-31 NOTE — Telephone Encounter (Signed)
Patient returned our call.  Patient states her home phone is not ringing but she saw on her TV that we called.  Patient states she would like for [place] to keep her home phone as her primary contact number, but she would like for [place] to call her back on her mobile phone:  [phone].

## 2021-08-31 NOTE — Telephone Encounter (Signed)
Pt called stating she is still having ear pain and throat. Pt stated she only has one pill left [phone]

## 2021-09-01 NOTE — Telephone Encounter (Signed)
S/w [name] - stated still with sore throat, and ear pain. Some congestion. Pt denies fever, sob, chest pain, dizziness, lightheadedness. Scheduled with Dr Birdie Sons Friday for eval

## 2021-09-01 NOTE — Telephone Encounter (Signed)
Patient returned Lawrence Santiago, CMA's call.  I transferred patient to [place].

## 2021-09-01 NOTE — Telephone Encounter (Signed)
Lm for pt to cb.

## 2021-09-01 NOTE — Telephone Encounter (Signed)
Pt returning call

## 2021-09-03 ENCOUNTER — Encounter: Payer: Self-pay | Admitting: Family Medicine

## 2021-09-03 ENCOUNTER — Ambulatory Visit (INDEPENDENT_AMBULATORY_CARE_PROVIDER_SITE_OTHER): Payer: Medicare Other | Admitting: Family Medicine

## 2021-09-03 VITALS — BP 110/70 | HR 69 | Temp 98.3°F | Ht 63.0 in | Wt 134.8 lb

## 2021-09-03 DIAGNOSIS — K219 Gastro-esophageal reflux disease without esophagitis: Secondary | ICD-10-CM

## 2021-09-03 DIAGNOSIS — J309 Allergic rhinitis, unspecified: Secondary | ICD-10-CM | POA: Diagnosis not present

## 2021-09-03 MED ORDER — AZELASTINE HCL 137 MCG/SPRAY NA SOLN: NASAL | 1 refills | Status: DC

## 2021-09-03 NOTE — Assessment & Plan Note (Signed)
Continues to have reflux intermittently and is concerned with potential side effects of omeprazole. Discussed if she is having persistent reflux the benefit of the omeprazole likely out weighs the risk, though she needs to follow-up with her PCP and GI for this.

## 2021-09-03 NOTE — Patient Instructions (Signed)
Nice to see you. Please try the astelin nasal spray to see if that will help with your symptoms. If not improving please let me know.

## 2021-09-03 NOTE — Progress Notes (Signed)
Carrie Alar, MD Phone: [phone]  Carrie Noble is a 81 y.o. female who presents today for same day visit.   Sore throat/left ear pain: This has been going on for couple of weeks.  She saw Dr. French Ana McLean-Scocuzza on 08/25/2021 and was given Ciprodex and azithromycin.  She continues to have left-sided sore throat.  Her left ear still bothers her.  She does not feel as though the Ciprodex has done anything.  She notes no postnasal drip.  No congestion.  No cough.  No fever.  No shortness of breath.  No taste or smell disturbances.  She did not test her cell for COVID.  She has tried Zyrtec once with little benefit.  Social History   Tobacco Use  Smoking Status Never  Smokeless Tobacco Never    Current Outpatient Medications on File Prior to Visit  Medication Sig Dispense Refill   atorvastatin (LIPITOR) 10 MG tablet One tablet q Monday, Wednesday and Friday. 39 tablet 1   Biotin 1000 MCG tablet Take 1,000 mcg by mouth 3 (three) times daily. Reported on 05/13/2015     Calcium Carbonate (CALCIUM 600 PO) Take by mouth daily. Reported on 05/13/2015     Cholecalciferol (VITAMIN D PO) Take by mouth daily. Reported on 05/13/2015     ciprofloxacin-dexamethasone (CIPRODEX) OTIC suspension Place 4 drops into the left ear 2 (two) times daily. X4-7 days 7.5 mL 0   estradiol (ESTRACE VAGINAL) 0.1 MG/GM vaginal cream Apply one applicator q hs x 5 nights and then one applicator 2x week. 42.5 g 2   hydrocortisone 2.5 % cream      loratadine (CLARITIN) 5 MG chewable tablet Chew 1 tablet (5 mg total) by mouth daily. 30 tablet 2   Magnesium Oxide 250 MG TABS Take 1 tablet by mouth daily.     omeprazole (PRILOSEC) 20 MG capsule Take 1 capsule (20 mg total) by mouth 2 (two) times daily before a meal. 180 capsule 1   polyethylene glycol (MIRALAX) 17 g packet Take 17 g by mouth daily. 14 each 0   Vibegron (GEMTESA) 75 MG TABS Take 75 mg by mouth daily. 30 tablet 2   vitamin B-12 (CYANOCOBALAMIN) 1000 MCG  tablet Take 1,000 mcg by mouth daily. Reported on 05/13/2015     Current Facility-Administered Medications on File Prior to Visit  Medication Dose Route Frequency Provider Last Rate Last Admin   betamethasone acetate-betamethasone sodium phosphate (CELESTONE) injection 3 mg  3 mg Intramuscular Once Gala Lewandowsky M, DPM       betamethasone acetate-betamethasone sodium phosphate (CELESTONE) injection 3 mg  3 mg Intramuscular Once Felecia Shelling, DPM         ROS see history of present illness  Objective  Physical Exam Vitals:   09/03/21 1121  BP: 110/70  Pulse: 69  Temp: 98.3 F (36.8 C)  SpO2: 98%    BP Readings from Last 3 Encounters:  09/03/21 110/70  08/25/21 134/78  08/13/21 126/80   Wt Readings from Last 3 Encounters:  09/03/21 134 lb 12.8 oz (61.1 kg)  08/25/21 133 lb 12.8 oz (60.7 kg)  08/13/21 132 lb (59.9 kg)    Physical Exam Constitutional:      General: She is not in acute distress.    Appearance: She is not diaphoretic.  HENT:     Right Ear: Tympanic membrane normal.     Left Ear: Tympanic membrane normal.     Mouth/Throat:     Pharynx: Oropharyngeal exudate (Small amount of  white exudate in the left posterior oropharynx) and posterior oropharyngeal erythema present.  Cardiovascular:     Rate and Rhythm: Normal rate and regular rhythm.     Heart sounds: Normal heart sounds.  Pulmonary:     Effort: Pulmonary effort is normal.     Breath sounds: Normal breath sounds.  Lymphadenopathy:     Cervical: No cervical adenopathy.  Skin:    General: Skin is warm and dry.  Neurological:     Mental Status: She is alert.      Assessment/Plan: Please see individual problem list.  Problem List Items Addressed This Visit     Allergic rhinitis - Primary (Chronic)    I suspect the patients symptoms may be allergy related leading to eustachian tube dysfunction. We will trial astelin to see if this will be beneficial. She declined prednisone. If not improving she  will let me know. Rapid strep test was negative.       Relevant Medications   Azelastine HCl 137 MCG/SPRAY SOLN   GERD (gastroesophageal reflux disease) (Chronic)    Continues to have reflux intermittently and is concerned with potential side effects of omeprazole. Discussed if she is having persistent reflux the benefit of the omeprazole likely out weighs the risk, though she needs to follow-up with her PCP and GI for this.        Return if symptoms worsen or fail to improve.   Carrie Alar, MD [facility]

## 2021-09-03 NOTE — Assessment & Plan Note (Addendum)
I suspect the patients symptoms may be allergy related leading to eustachian tube dysfunction. We will trial astelin to see if this will be beneficial. She declined prednisone. If not improving she will let me know. Rapid strep test was negative.

## 2021-09-07 ENCOUNTER — Other Ambulatory Visit: Payer: Self-pay | Admitting: Internal Medicine

## 2021-09-07 ENCOUNTER — Telehealth: Payer: Self-pay

## 2021-09-07 MED ORDER — NYSTATIN 100000 UNIT/ML MT SUSP: OROMUCOSAL | 0 refills | Status: DC

## 2021-09-07 NOTE — Telephone Encounter (Signed)
Patient states her pharmacy couldn't fill the Azelastine HCl 137 MCG/SPRAY SOLN, but she had some left at home and has been using it.  Patient states her throat feels like it's getting worse.  Patient states she isn't sure what she should do.  *Patient states her preferred pharmacy is [facility] on [address].

## 2021-09-07 NOTE — Telephone Encounter (Signed)
I sent in rx for nystatin suspension - swish and spit tid.  If persistent problem, let us know

## 2021-09-07 NOTE — Telephone Encounter (Signed)
Need more information - specific symptoms having now?

## 2021-09-07 NOTE — Telephone Encounter (Signed)
Pt advised.

## 2021-09-07 NOTE — Progress Notes (Signed)
Rx sent in for nystatin suspension.

## 2021-09-07 NOTE — Telephone Encounter (Signed)
Pt reports throat sore still - irritated.  Stated it feels like there is one spot in the back of the throat on the L side that hurts. Pt stated feels like an ulcer - has hx of thrush, stated feels kinda like that. Ear pain has subsided, just sore throat.

## 2021-09-13 ENCOUNTER — Telehealth: Payer: Self-pay | Admitting: Internal Medicine

## 2021-09-13 NOTE — Telephone Encounter (Signed)
Pt called stating she is having issues with her stomach like its a blocked bowel. Pt states she can not go the bathroom and when she goes its very little and she feels pain. Sent to access nurse

## 2021-09-13 NOTE — Telephone Encounter (Signed)
I called and spoke with patient to triage. She said that she has been able to pass gas & there was some seepage from her bowl yesterday. She has just been in such pain & he stomach has felt sore. I did advise that if she had a true blockage that she would not ne able to even pass gas. Access Nurse had advised to try some suppositories first & patient stated that her husband was gone to get her some. She has also taken some milk of magnesia. Pt stated that if suppositories did work then she would either go to [facility] walk-in or call Vevelyn Pat, FNP although she did not want to go to [facility] to be seen.

## 2021-10-11 ENCOUNTER — Other Ambulatory Visit (INDEPENDENT_AMBULATORY_CARE_PROVIDER_SITE_OTHER): Payer: Medicare Other

## 2021-10-11 DIAGNOSIS — N1831 Chronic kidney disease, stage 3a: Secondary | ICD-10-CM

## 2021-10-11 DIAGNOSIS — R739 Hyperglycemia, unspecified: Secondary | ICD-10-CM | POA: Diagnosis not present

## 2021-10-11 DIAGNOSIS — E7800 Pure hypercholesterolemia, unspecified: Secondary | ICD-10-CM | POA: Diagnosis not present

## 2021-10-11 LAB — BASIC METABOLIC PANEL
BUN: 16 mg/dL (ref 6–23)
CO2: 28 mEq/L (ref 19–32)
Calcium: 9.7 mg/dL (ref 8.4–10.5)
Chloride: 104 mEq/L (ref 96–112)
Creatinine, Ser: 1.03 mg/dL (ref 0.40–1.20)
GFR: 50.67 mL/min — ABNORMAL LOW (ref >60.00)
Glucose, Bld: 96 mg/dL (ref 70–99)
Potassium: 4.4 mEq/L (ref 3.5–5.1)
Sodium: 140 mEq/L (ref 135–145)

## 2021-10-11 LAB — HEPATIC FUNCTION PANEL
ALT: 12 U/L (ref 0–35)
AST: 17 U/L (ref 0–37)
Albumin: 4.2 g/dL (ref 3.5–5.2)
Alkaline Phosphatase: 121 U/L — ABNORMAL HIGH (ref 39–117)
Bilirubin, Direct: 0.1 mg/dL (ref 0.0–0.3)
Total Bilirubin: 0.5 mg/dL (ref 0.2–1.2)
Total Protein: 7.1 g/dL (ref 6.0–8.3)

## 2021-10-11 LAB — LIPID PANEL
Cholesterol: 240 mg/dL — ABNORMAL HIGH (ref 0–200)
HDL: 58 mg/dL (ref >39.00)
LDL Cholesterol: 159 mg/dL — ABNORMAL HIGH (ref 0–99)
NonHDL: 181.82
Total CHOL/HDL Ratio: 4
Triglycerides: 114 mg/dL (ref 0.0–149.0)
VLDL: 22.8 mg/dL (ref 0.0–40.0)

## 2021-10-11 LAB — HEMOGLOBIN A1C: Hgb A1c MFr Bld: 5.8 % (ref 4.6–6.5)

## 2021-10-13 IMAGING — MG MM DIGITAL SCREENING BILAT W/ TOMO AND CAD
8 series · 8 of 24 positions shown · non-contrast
Comparison: Previous exam(s).

CLINICAL DATA: Screening.

EXAM:
DIGITAL SCREENING BILATERAL MAMMOGRAM WITH TOMO AND CAD

[R MLO synth-2D]
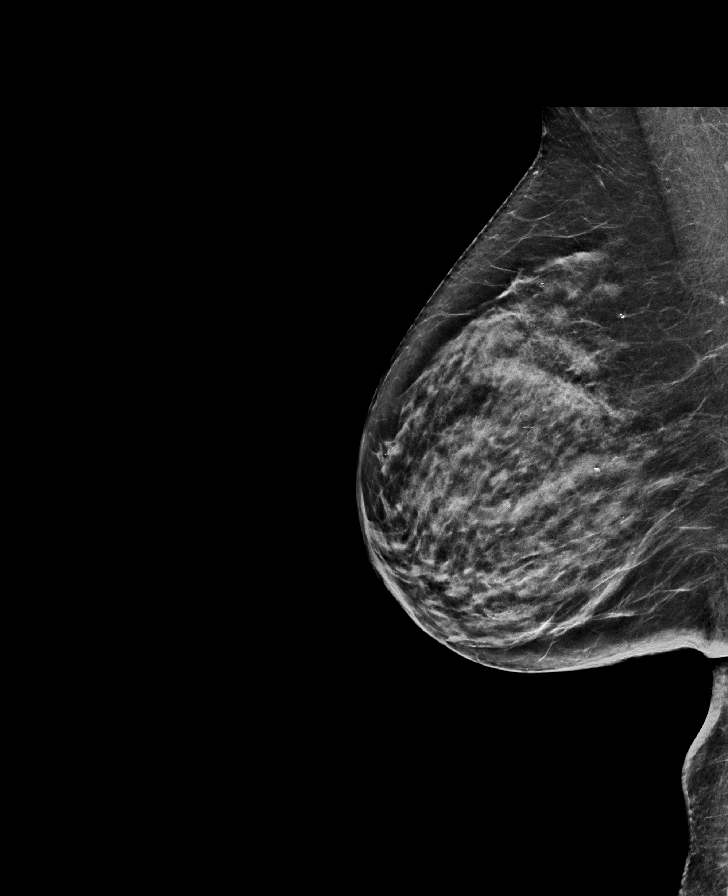

[R CC synth-2D]
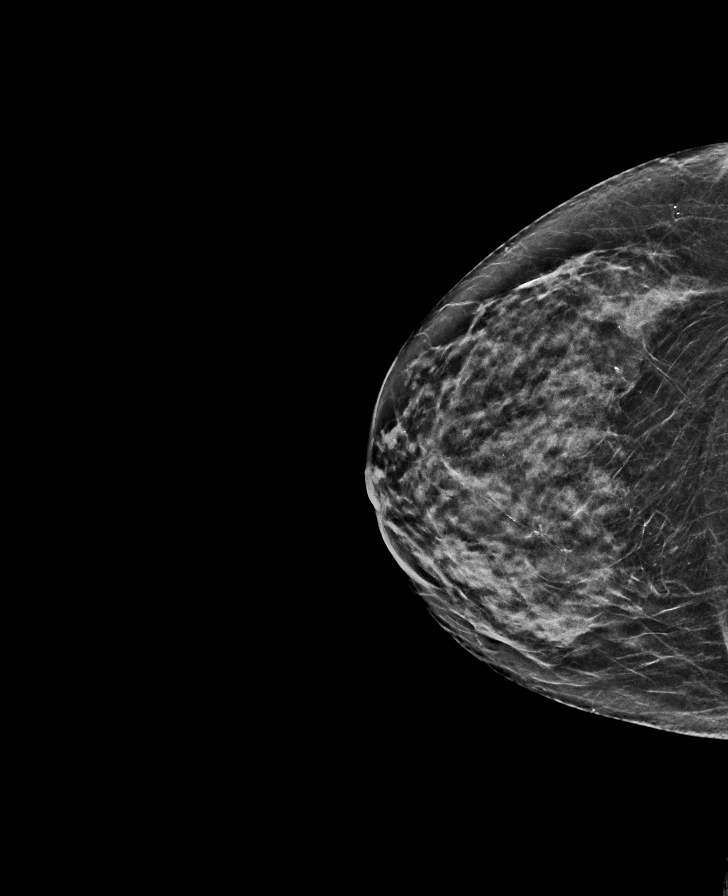

[L CC synth-2D]
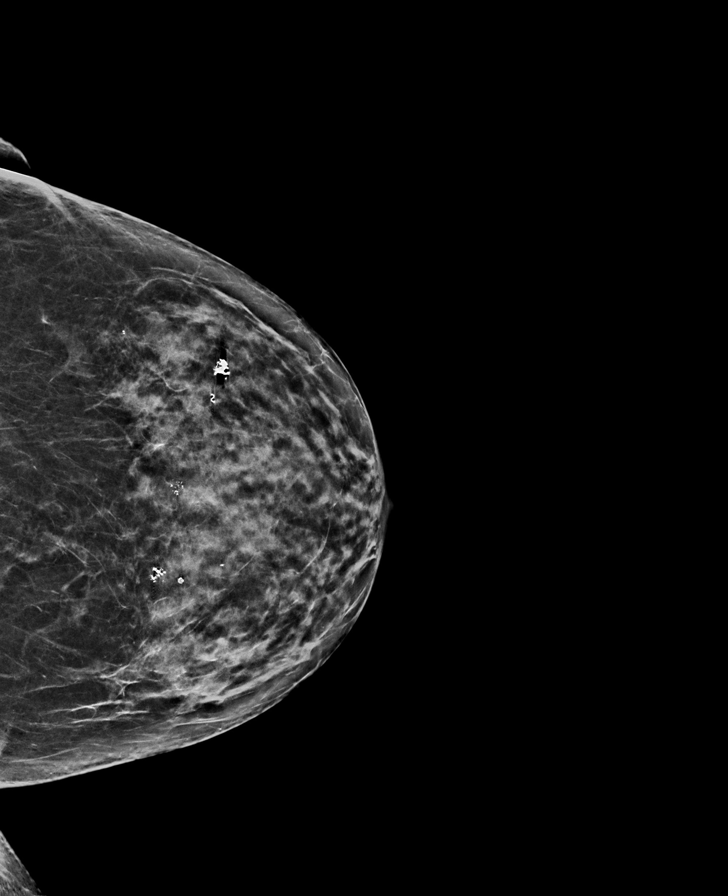

[L MLO synth-2D]
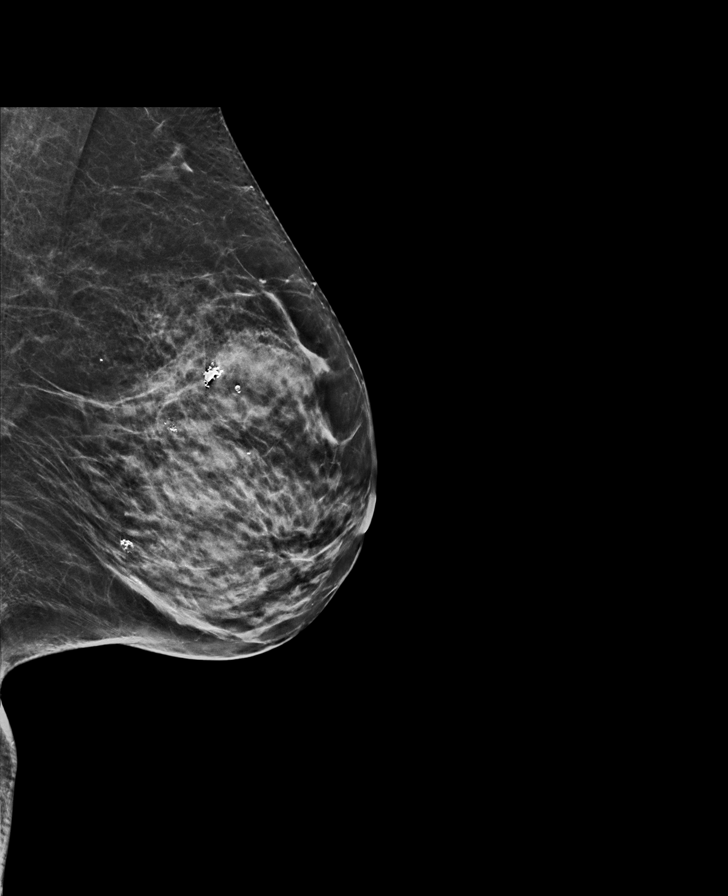

[R CC tomo · tomo slice 29/57.0]
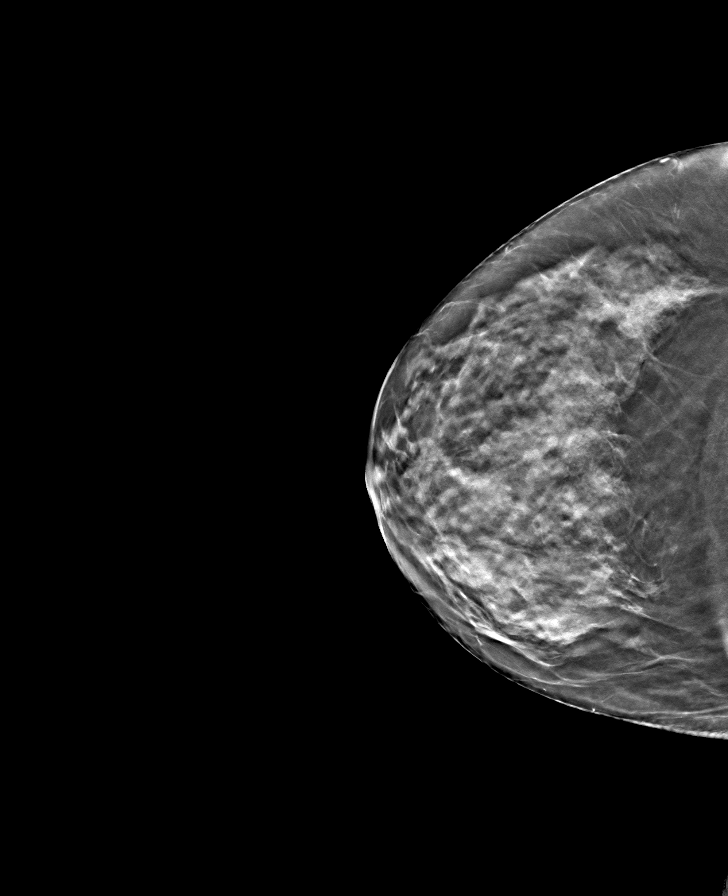

[L MLO tomo · tomo slice 31/61.0]
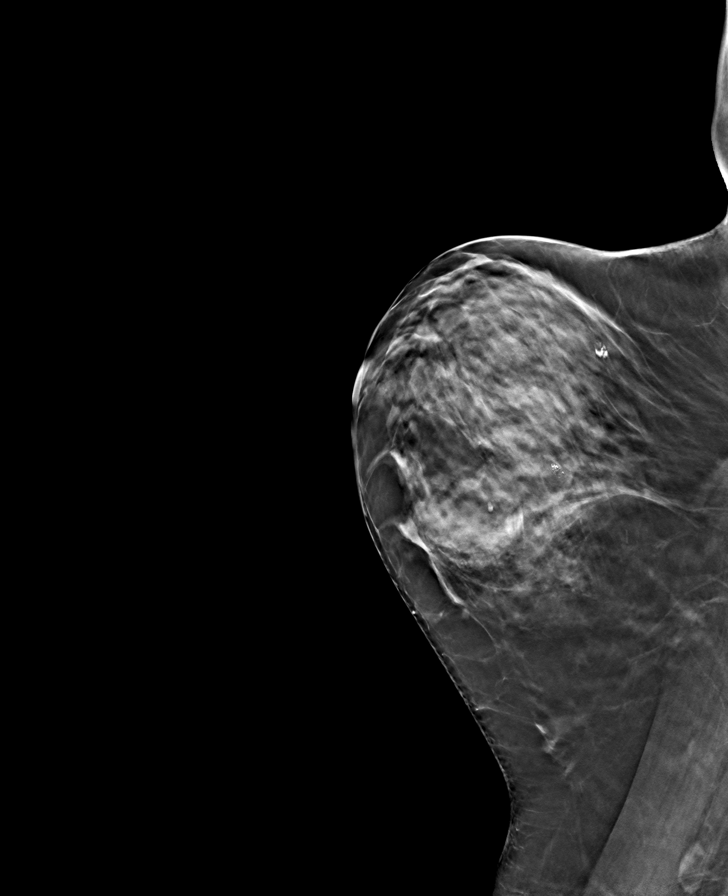

[L CC tomo · tomo slice 31/60.0]
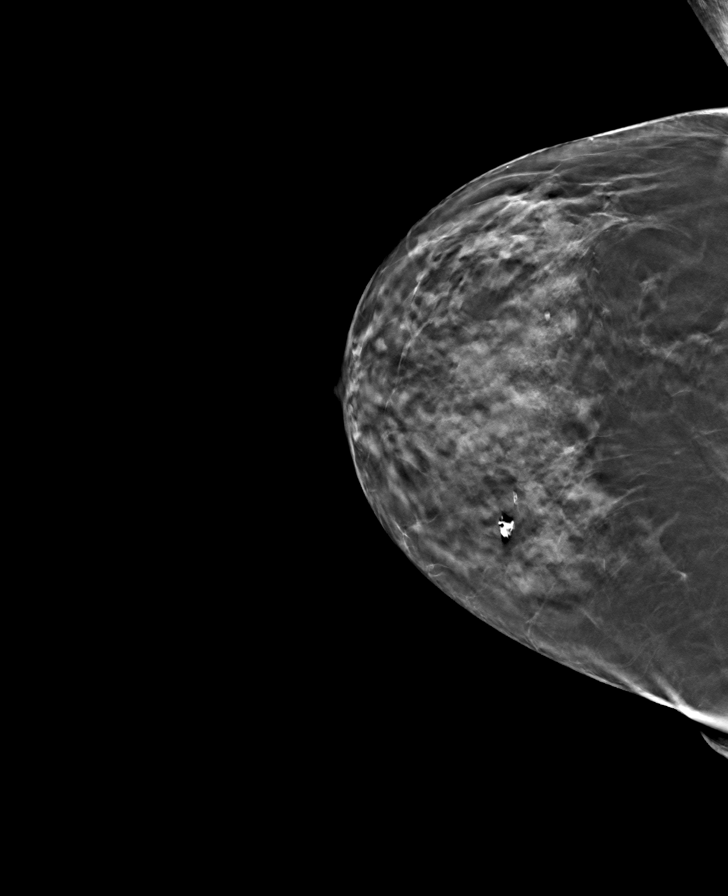

[R MLO tomo · tomo slice 31/61.0]
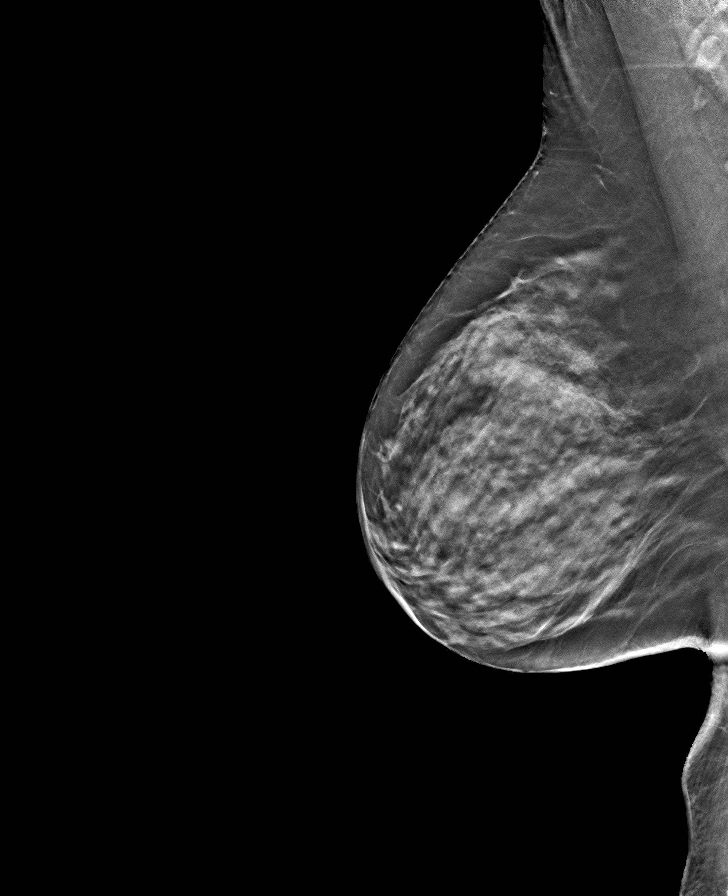

[8 of 24 positions shown; findings below may reference images not displayed]

ACR Breast Density Category c: The breast tissue is heterogeneously
dense, which may obscure small masses.
FINDINGS: There are no findings suspicious for malignancy. The images were
evaluated with computer-aided detection.
IMPRESSION: No mammographic evidence of malignancy. A result letter of this
screening mammogram will be mailed directly to the patient.

RECOMMENDATION:
Screening mammogram in one year. (Code:JF-W-WVL)

BI-RADS CATEGORY  1: Negative.

## 2021-10-15 ENCOUNTER — Ambulatory Visit (INDEPENDENT_AMBULATORY_CARE_PROVIDER_SITE_OTHER): Payer: Medicare Other | Admitting: Internal Medicine

## 2021-10-15 DIAGNOSIS — K219 Gastro-esophageal reflux disease without esophagitis: Secondary | ICD-10-CM | POA: Diagnosis not present

## 2021-10-15 DIAGNOSIS — D75839 Thrombocytosis, unspecified: Secondary | ICD-10-CM

## 2021-10-15 DIAGNOSIS — R739 Hyperglycemia, unspecified: Secondary | ICD-10-CM

## 2021-10-15 DIAGNOSIS — I10 Essential (primary) hypertension: Secondary | ICD-10-CM | POA: Diagnosis not present

## 2021-10-15 DIAGNOSIS — F439 Reaction to severe stress, unspecified: Secondary | ICD-10-CM

## 2021-10-15 DIAGNOSIS — K2270 Barrett's esophagus without dysplasia: Secondary | ICD-10-CM

## 2021-10-15 DIAGNOSIS — I700 Atherosclerosis of aorta: Secondary | ICD-10-CM | POA: Diagnosis not present

## 2021-10-15 DIAGNOSIS — E7800 Pure hypercholesterolemia, unspecified: Secondary | ICD-10-CM

## 2021-10-15 DIAGNOSIS — Z23 Encounter for immunization: Secondary | ICD-10-CM

## 2021-10-15 DIAGNOSIS — N1831 Chronic kidney disease, stage 3a: Secondary | ICD-10-CM

## 2021-10-15 DIAGNOSIS — E041 Nontoxic single thyroid nodule: Secondary | ICD-10-CM

## 2021-10-15 NOTE — Progress Notes (Signed)
Patient ID: Carrie Noble, female   DOB: 06-01-1940, 81 y.o.   MRN: [id number]   Subjective:    Patient ID: Carrie Noble, female    DOB: 31-Jan-1940, 81 y.o.   MRN: [id number]   Patient here for  Chief Complaint  Patient presents with   Follow-up    2 month follow up   .   HPI Here to follow up regarding her blood pressure and cholesterol.  Saw Dr Kelly Services 08/25/21 - ear pain - ciprodex and zpak.  Persistent symptoms.  Saw Dr Caryl Bis 09/03/21.  Trial of astelin.  Rapid strep negative.  No chest pain.  No increased cough or congestion reported.  Breathing stable.  Describes burning sensation.  Has had concern regarding PPI.  Discussed pepcid bid.  Given persistence - discussed f/u with GI.  Taking miralax to help with bowels.  Increased stress - husband.     Past Medical History:  Diagnosis Date   Allergy    Arthritis    Gastroesophageal reflux    Hiatal hernia 1989   status post Nissen fundoplication    Hx of hysterectomy    Hyperlipidemia    Tachycardia    Patient stated that this has been occuring frequently.   Thyroid disease    Goiter   Past Surgical History:  Procedure Laterality Date   ABDOMINAL HYSTERECTOMY  1980   partial   blepheroplasty b/l eyes Bilateral    BREAST BIOPSY Right    neg   BREAST EXCISIONAL BIOPSY Left 2014   neg   COLONOSCOPY  2015   ESOPHAGOGASTRIC FUNDOPLICATION  7001   LAPAROSCOPIC NISSEN FUNDOPLICATION  7494   TONSILLECTOMY     as well as goiter   UPPER GI ENDOSCOPY  2015   Family History  Problem Relation Age of Onset   Stroke Mother 35   Arthritis Mother    Heart disease Mother    Hypertension Mother    Stroke Father 26   Hypertension Father    Rheumatic fever Brother        and multiple open heart surgeries   Diabetes Brother        type 2   Stroke Brother    Other Sister        Coronary Atherosclerosis   Stroke Brother    Stroke Sister    Heart disease Sister    Dementia Sister    Cancer Sister        ovarian   Breast  cancer Neg Hx    Social History   Socioeconomic History   Marital status: Married    Spouse name: Not on file   Number of children: Not on file   Years of education: Not on file   Highest education level: Not on file  Occupational History   Occupation: Retired    Fish farm manager: OTHER  Tobacco Use   Smoking status: Never   Smokeless tobacco: Never  Substance and Sexual Activity   Alcohol use: No    Alcohol/week: 0.0 standard drinks of alcohol   Drug use: No   Sexual activity: Never  Other Topics Concern   Not on file  Social History Narrative   Occasionally drinks coffee.   Social Determinants of Health   Financial Resource Strain: Low Risk  (06/17/2021)   Overall Financial Resource Strain (CARDIA)    Difficulty of Paying Living Expenses: Not hard at all  Food Insecurity: No Food Insecurity (06/17/2021)   Hunger Vital Sign    Worried About Running Out  of Food in the Last Year: Never true    [place] in the Last Year: Never true  Transportation Needs: No Transportation Needs (06/17/2021)   PRAPARE - Hydrologist (Medical): No    Lack of Transportation (Non-Medical): No  Physical Activity: Insufficiently Active (06/17/2021)   Exercise Vital Sign    Days of Exercise per Week: 7 days    Minutes of Exercise per Session: 20 min  Stress: No Stress Concern Present (06/17/2021)   [place]    Feeling of Stress : Not at all  Social Connections: Unknown (06/09/2020)   Social Connection and Isolation Panel [NHANES]    Frequency of Communication with Friends and Family: Not on file    Frequency of Social Gatherings with Friends and Family: Not on file    Attends Religious Services: Not on file    Active Member of Clubs or Organizations: Not on file    Attends Archivist Meetings: Not on file    Marital Status: Married     Review of Systems  Constitutional:  Negative for  appetite change and unexpected weight change.  HENT:  Negative for congestion and sinus pressure.   Respiratory:  Negative for cough, chest tightness and shortness of breath.   Cardiovascular:  Negative for chest pain, palpitations and leg swelling.  Gastrointestinal:  Negative for abdominal pain, diarrhea, nausea and vomiting.  Genitourinary:  Negative for difficulty urinating and dysuria.  Musculoskeletal:  Negative for joint swelling and myalgias.  Skin:  Negative for color change and rash.  Neurological:  Negative for dizziness and headaches.  Psychiatric/Behavioral:  Negative for agitation and dysphoric mood.        Objective:     BP 130/72   Pulse 71   Temp 97.8 F (36.6 C)   Resp 14   Wt 137 lb (62.1 kg)   SpO2 96%   BMI 24.27 kg/m  Wt Readings from Last 3 Encounters:  10/15/21 137 lb (62.1 kg)  09/03/21 134 lb 12.8 oz (61.1 kg)  08/25/21 133 lb 12.8 oz (60.7 kg)    Physical Exam Vitals reviewed.  Constitutional:      General: She is not in acute distress.    Appearance: Normal appearance.  HENT:     Head: Normocephalic and atraumatic.     Right Ear: External ear normal.     Left Ear: External ear normal.  Eyes:     General: No scleral icterus.       Right eye: No discharge.        Left eye: No discharge.     Conjunctiva/sclera: Conjunctivae normal.  Neck:     Thyroid: No thyromegaly.  Cardiovascular:     Rate and Rhythm: Normal rate and regular rhythm.  Pulmonary:     Effort: No respiratory distress.     Breath sounds: Normal breath sounds. No wheezing.  Abdominal:     General: Bowel sounds are normal.     Palpations: Abdomen is soft.     Tenderness: There is no abdominal tenderness.  Musculoskeletal:        General: No swelling or tenderness.     Cervical back: Neck supple. No tenderness.  Lymphadenopathy:     Cervical: No cervical adenopathy.  Skin:    Findings: No erythema or rash.  Neurological:     Mental Status: She is alert.   Psychiatric:        Mood  and Affect: Mood normal.        Behavior: Behavior normal.      Outpatient Encounter Medications as of 10/15/2021  Medication Sig   atorvastatin (LIPITOR) 10 MG tablet One tablet q Monday, Wednesday and Friday.   Azelastine HCl 137 MCG/SPRAY SOLN PLACE 1 SPRAY INTO BOTH NOSTRILS 2 (TWO) TIMES DAILY. USE IN EACH NOSTRIL AS DIRECTED   Biotin 1000 MCG tablet Take 1,000 mcg by mouth 3 (three) times daily. Reported on 05/13/2015   Cholecalciferol (VITAMIN D PO) Take by mouth daily. Reported on 05/13/2015   loratadine (CLARITIN) 5 MG chewable tablet Chew 1 tablet (5 mg total) by mouth daily.   Magnesium Oxide 250 MG TABS Take 1 tablet by mouth daily.   omeprazole (PRILOSEC) 20 MG capsule Take 1 capsule (20 mg total) by mouth 2 (two) times daily before a meal.   polyethylene glycol (MIRALAX) 17 g packet Take 17 g by mouth daily.   Varenicline Tartrate (TYRVAYA) 0.03 MG/ACT SOLN Place into the nose.   Vibegron (GEMTESA) 75 MG TABS Take 75 mg by mouth daily.   vitamin B-12 (CYANOCOBALAMIN) 1000 MCG tablet Take 1,000 mcg by mouth daily. Reported on 05/13/2015   Calcium Carbonate (CALCIUM 600 PO) Take by mouth daily. Reported on 05/13/2015 (Patient not taking: Reported on 10/15/2021)   estradiol (ESTRACE VAGINAL) 0.1 MG/GM vaginal cream Apply one applicator q hs x 5 nights and then one applicator 2x week. (Patient not taking: Reported on 10/15/2021)   [DISCONTINUED] ciprofloxacin-dexamethasone (CIPRODEX) OTIC suspension Place 4 drops into the left ear 2 (two) times daily. X4-7 days   [DISCONTINUED] hydrocortisone 2.5 % cream    [DISCONTINUED] nystatin (MYCOSTATIN) 100000 UNIT/ML suspension 5cc's swish and spit tid   Facility-Administered Encounter Medications as of 10/15/2021  Medication   betamethasone acetate-betamethasone sodium phosphate (CELESTONE) injection 3 mg   betamethasone acetate-betamethasone sodium phosphate (CELESTONE) injection 3 mg     Lab Results  Component  Value Date   WBC 7.4 06/30/2021   HGB 13.2 06/30/2021   HCT 38.5 06/30/2021   PLT 368.0 06/30/2021   GLUCOSE 96 10/11/2021   CHOL 240 (H) 10/11/2021   TRIG 114.0 10/11/2021   HDL 58.00 10/11/2021   LDLDIRECT 141.0 09/01/2016   LDLCALC 159 (H) 10/11/2021   ALT 12 10/11/2021   AST 17 10/11/2021   NA 140 10/11/2021   K 4.4 10/11/2021   CL 104 10/11/2021   CREATININE 1.03 10/11/2021   BUN 16 10/11/2021   CO2 28 10/11/2021   TSH 4.26 06/30/2021   HGBA1C 5.8 10/11/2021       Assessment & Plan:   Problem List Items Addressed This Visit     Aortic atherosclerosis ([place])    Continue lipitor.       Barrett's esophagus    F/u with GI as outlined.        CKD (chronic kidney disease) stage 3, GFR 30-59 ml/min (HCC)    Continue to avoid antiinflammatories.  Stay hydrated.  Follow.  Follow metabolic panel.       Hypercholesterolemia    Continue lipitor.  Low cholesterol diet and exercise.  Follow lipid panel and liver function tests.        Hyperglycemia    Low carb diet and exercise.  Follow met b and a1c.       Stress    Continue zoloft.       Thrombocytosis    Follow cbc.       Thyroid nodule    Saw Dr  Honor Junes 06/10/21 - recommended f/u ultrasound in 2 years.        Benign essential HTN (Chronic)    Blood pressure as outlined.  Follow pressures.  Send in readings.        GERD (gastroesophageal reflux disease) (Chronic)    Has had concerns regarding taking PPI.  Discussed pepcid - bid.  Acid/burning.  Refer to GI for reevaluation.        Relevant Orders   Ambulatory referral to Gastroenterology   Other Visit Diagnoses     Need for immunization against influenza       Relevant Orders   Flu Vaccine QUAD High Dose(Fluad) (Completed)        Einar Pheasant, MD

## 2021-10-24 ENCOUNTER — Encounter: Payer: Self-pay | Admitting: Internal Medicine

## 2021-10-24 NOTE — Assessment & Plan Note (Signed)
Blood pressure as outlined.  Follow pressures.  Send in readings.

## 2021-10-24 NOTE — Assessment & Plan Note (Signed)
Continue lipitor  ?

## 2021-10-24 NOTE — Assessment & Plan Note (Signed)
Follow [facility]bc.  

## 2021-10-24 NOTE — Assessment & Plan Note (Signed)
F/u with GI as outlined.

## 2021-10-24 NOTE — Assessment & Plan Note (Signed)
Low carb diet and exercise.  Follow met b and a1c.  

## 2021-10-24 NOTE — Assessment & Plan Note (Signed)
Saw Dr O'Connell 06/10/21 - recommended f/u ultrasound in 2 years.   

## 2021-10-24 NOTE — Assessment & Plan Note (Signed)
Continue lipitor.  Low cholesterol diet and exercise.  Follow lipid panel and liver function tests.   

## 2021-10-24 NOTE — Assessment & Plan Note (Signed)
Has had concerns regarding taking PPI.  Discussed pepcid - bid.  Acid/burning.  Refer to GI for reevaluation.

## 2021-10-24 NOTE — Assessment & Plan Note (Signed)
Continue to avoid antiinflammatories.  Stay hydrated.  Follow.  Follow metabolic panel.  

## 2021-10-24 NOTE — Assessment & Plan Note (Signed)
-  Continue zoloft  

## 2021-10-25 ENCOUNTER — Telehealth: Payer: Self-pay | Admitting: Internal Medicine

## 2021-10-25 NOTE — Telephone Encounter (Signed)
Pt would like to be called regarding her acid reflux medication.

## 2021-10-26 NOTE — Telephone Encounter (Signed)
Spoke to Patient regarding Protonix and calling GI. Patient states she is calling GI- Carrie Noble now to see if she can get in. Patient will call back and let us know if she wants the Protonix.

## 2021-10-26 NOTE — Telephone Encounter (Signed)
She has been on protonix previously.  Does she want to go back on protonix?  Also, she needs to make GI aware still having issues.

## 2021-10-28 NOTE — Telephone Encounter (Signed)
Pt called stating she cant not be seen since January for Dr. Jacqulyn Liner and want to see the provider can recommend another person

## 2021-10-28 NOTE — Telephone Encounter (Signed)
I called patient & she is on cancellation list for [facility]. I have called and right now they just have nothing sooner. Patient stated that she may just have to go back on the omeprazole until she is seen. She also is scheduled with Dr. Oren Beckmann at [facility] & if they can see her sooner she will go there.

## 2021-11-08 ENCOUNTER — Other Ambulatory Visit: Payer: Self-pay | Admitting: Internal Medicine

## 2021-11-12 ENCOUNTER — Encounter: Payer: Self-pay | Admitting: Family Medicine

## 2021-11-12 ENCOUNTER — Ambulatory Visit (INDEPENDENT_AMBULATORY_CARE_PROVIDER_SITE_OTHER): Payer: Medicare Other | Admitting: Family Medicine

## 2021-11-12 DIAGNOSIS — E041 Nontoxic single thyroid nodule: Secondary | ICD-10-CM | POA: Diagnosis not present

## 2021-11-12 DIAGNOSIS — H00012 Hordeolum externum right lower eyelid: Secondary | ICD-10-CM

## 2021-11-12 DIAGNOSIS — J989 Respiratory disorder, unspecified: Secondary | ICD-10-CM | POA: Diagnosis not present

## 2021-11-12 DIAGNOSIS — H00019 Hordeolum externum unspecified eye, unspecified eyelid: Secondary | ICD-10-CM | POA: Insufficient documentation

## 2021-11-12 LAB — POC COVID19 BINAXNOW: SARS Coronavirus 2 Ag: NEGATIVE

## 2021-11-12 MED ORDER — BENZONATATE 200 MG PO CAPS: 200.0000 mg | ORAL_CAPSULE | Freq: Two times a day (BID) | ORAL | 0 refills | Status: DC | PRN

## 2021-11-12 NOTE — Assessment & Plan Note (Signed)
Encouraged warm compresses for 10 minutes 3 times a day.  If not improving she will see her eye doctor.

## 2021-11-12 NOTE — Progress Notes (Signed)
Marikay Alar, MD Phone: [phone]  Carrie Noble is a 81 y.o. female who presents today for same-day visit.  Respiratory illness: Patient presents with 2 days of symptoms.  She had cough, chest congestion, sore throat, postnasal drip, and sneezing.  She had mild headache.  She has no fevers.  No shortness of breath.  No hemoptysis.  No known COVID exposures.  Her grandchild is sick with similar symptoms.  Her husband is also sick with similar symptoms.  Her right lower eyelid has had some irritation as well.  She tried over-the-counter cough medicines with some benefit.  Social History   Tobacco Use  Smoking Status Never  Smokeless Tobacco Never    Current Outpatient Medications on File Prior to Visit  Medication Sig Dispense Refill   atorvastatin (LIPITOR) 10 MG tablet One tablet q Monday, Wednesday and Friday. 39 tablet 1   Azelastine HCl 137 MCG/SPRAY SOLN PLACE 1 SPRAY INTO BOTH NOSTRILS 2 (TWO) TIMES DAILY. USE IN EACH NOSTRIL AS DIRECTED 30 mL 1   Biotin 1000 MCG tablet Take 1,000 mcg by mouth 3 (three) times daily. Reported on 05/13/2015     Calcium Carbonate (CALCIUM 600 PO) Take by mouth daily. Reported on 05/13/2015     Cholecalciferol (VITAMIN D PO) Take by mouth daily. Reported on 05/13/2015     estradiol (ESTRACE VAGINAL) 0.1 MG/GM vaginal cream Apply one applicator q hs x 5 nights and then one applicator 2x week. 42.5 g 2   GEMTESA 75 MG TABS TAKE 1 TABLET BY MOUTH DAILY 90 tablet 2   loratadine (CLARITIN) 5 MG chewable tablet Chew 1 tablet (5 mg total) by mouth daily. 30 tablet 2   Magnesium Oxide 250 MG TABS Take 1 tablet by mouth daily.     omeprazole (PRILOSEC) 20 MG capsule Take 1 capsule (20 mg total) by mouth 2 (two) times daily before a meal. 180 capsule 1   polyethylene glycol (MIRALAX) 17 g packet Take 17 g by mouth daily. 14 each 0   Varenicline Tartrate (TYRVAYA) 0.03 MG/ACT SOLN Place into the nose.     vitamin B-12 (CYANOCOBALAMIN) 1000 MCG tablet Take  1,000 mcg by mouth daily. Reported on 05/13/2015     Current Facility-Administered Medications on File Prior to Visit  Medication Dose Route Frequency Provider Last Rate Last Admin   betamethasone acetate-betamethasone sodium phosphate (CELESTONE) injection 3 mg  3 mg Intramuscular Once Gala Lewandowsky M, DPM       betamethasone acetate-betamethasone sodium phosphate (CELESTONE) injection 3 mg  3 mg Intramuscular Once Felecia Shelling, DPM         ROS see history of present illness  Objective  Physical Exam Vitals:   11/12/21 1516  BP: 110/70  Pulse: 72  Temp: 98.1 F (36.7 C)  SpO2: 97%    BP Readings from Last 3 Encounters:  11/12/21 110/70  10/15/21 130/72  09/03/21 110/70   Wt Readings from Last 3 Encounters:  11/12/21 137 lb 3.2 oz (62.2 kg)  10/15/21 137 lb (62.1 kg)  09/03/21 134 lb 12.8 oz (61.1 kg)    Physical Exam Constitutional:      General: She is not in acute distress.    Appearance: She is not diaphoretic.  HENT:     Right Ear: Tympanic membrane normal.     Left Ear: Tympanic membrane normal.  Eyes:   Neck:     Comments: Right thyroid gland feels enlarged on exam Cardiovascular:     Rate and Rhythm: Normal  rate and regular rhythm.     Heart sounds: Normal heart sounds.  Pulmonary:     Effort: Pulmonary effort is normal.     Breath sounds: Normal breath sounds.  Lymphadenopathy:     Cervical: No cervical adenopathy.  Skin:    General: Skin is warm and dry.  Neurological:     Mental Status: She is alert.   Patient did have a wheezing sound on exam though this appeared to be emanating from her throat   Assessment/Plan: Please see individual problem list.  Problem List Items Addressed This Visit     Thyroid nodule (Chronic)    She has a known very large right-sided thyroid nodule.  This has been evaluated with ultrasound this year and she follows with endocrinology on this.      Respiratory illness    Discussed with the patient that this is  likely viral.  COVID-19 testing completed.  Rapid test was negative.  We will send PCR testing.  She was advised to stay quarantined at home until we contact her with the PCR test result.  We will treat cough with Tessalon.  She can continue over-the-counter cough medication as well if it is beneficial.  If she develops any shortness of breath, cough productive of blood, or fever she will seek reevaluation.  Discussed at this time antibiotics would not serve a purpose.      Relevant Medications   benzonatate (TESSALON) 200 MG capsule   Other Relevant Orders   Novel Coronavirus, NAA ([facility])   POC COVID-19   Stye    Encouraged warm compresses for 10 minutes 3 times a day.  If not improving she will see her eye doctor.        Return if symptoms worsen or fail to improve.   Tommi Rumps, MD [place]

## 2021-11-12 NOTE — Assessment & Plan Note (Addendum)
Discussed with the patient that this is likely viral.  COVID-19 testing completed.  Rapid test was negative.  We will send PCR testing.  She was advised to stay quarantined at home until we contact her with the PCR test result.  We will treat cough with Tessalon.  She can continue over-the-counter cough medication as well if it is beneficial.  If she develops any shortness of breath, cough productive of blood, or fever she will seek reevaluation.  Discussed at this time antibiotics would not serve a purpose.

## 2021-11-12 NOTE — Assessment & Plan Note (Signed)
She has a known very large right-sided thyroid nodule.  This has been evaluated with ultrasound this year and she follows with endocrinology on this.

## 2021-11-12 NOTE — Patient Instructions (Signed)
Nice to see you. You can use the Tessalon for cough. We will contact you with your sendoff COVID test.  Please stay quarantined at home until we get that back. If you develop cough productive of blood, shortness of breath, or fever please get reevaluated.

## 2021-11-13 LAB — NOVEL CORONAVIRUS, NAA: SARS-CoV-2, NAA: NOT DETECTED

## 2021-11-15 ENCOUNTER — Telehealth: Payer: Self-pay | Admitting: Internal Medicine

## 2021-11-15 NOTE — Telephone Encounter (Signed)
Patient called and wanted to know her COVID results. Results were given. Patient was seen on 11/12/2021 with Dr Caryl Bis.

## 2021-12-10 ENCOUNTER — Encounter: Payer: Self-pay | Admitting: Internal Medicine

## 2021-12-10 ENCOUNTER — Ambulatory Visit (INDEPENDENT_AMBULATORY_CARE_PROVIDER_SITE_OTHER): Payer: Medicare Other

## 2021-12-10 ENCOUNTER — Ambulatory Visit (INDEPENDENT_AMBULATORY_CARE_PROVIDER_SITE_OTHER): Payer: Medicare Other | Admitting: Internal Medicine

## 2021-12-10 VITALS — BP 130/80 | HR 73 | Temp 98.0°F | Resp 15 | Ht 63.0 in | Wt 135.2 lb

## 2021-12-10 DIAGNOSIS — R739 Hyperglycemia, unspecified: Secondary | ICD-10-CM

## 2021-12-10 DIAGNOSIS — F439 Reaction to severe stress, unspecified: Secondary | ICD-10-CM

## 2021-12-10 DIAGNOSIS — I10 Essential (primary) hypertension: Secondary | ICD-10-CM | POA: Diagnosis not present

## 2021-12-10 DIAGNOSIS — E7800 Pure hypercholesterolemia, unspecified: Secondary | ICD-10-CM

## 2021-12-10 DIAGNOSIS — R059 Cough, unspecified: Secondary | ICD-10-CM

## 2021-12-10 DIAGNOSIS — N1831 Chronic kidney disease, stage 3a: Secondary | ICD-10-CM

## 2021-12-10 DIAGNOSIS — I700 Atherosclerosis of aorta: Secondary | ICD-10-CM | POA: Diagnosis not present

## 2021-12-10 DIAGNOSIS — E559 Vitamin D deficiency, unspecified: Secondary | ICD-10-CM

## 2021-12-10 DIAGNOSIS — K219 Gastro-esophageal reflux disease without esophagitis: Secondary | ICD-10-CM

## 2021-12-10 DIAGNOSIS — E041 Nontoxic single thyroid nodule: Secondary | ICD-10-CM

## 2021-12-10 MED ORDER — BENZONATATE 100 MG PO CAPS: 100.0000 mg | ORAL_CAPSULE | Freq: Three times a day (TID) | ORAL | 0 refills | Status: DC | PRN

## 2021-12-10 MED ORDER — LEVALBUTEROL TARTRATE 45 MCG/ACT IN AERO: 2.0000 | INHALATION_SPRAY | Freq: Four times a day (QID) | RESPIRATORY_TRACT | 0 refills | Status: DC | PRN

## 2021-12-10 NOTE — Progress Notes (Unsigned)
Patient ID: Carrie Noble, female   DOB: January 12, 1941, 81 y.o.   MRN: [id number]   Subjective:    Patient ID: Carrie Noble, female    DOB: 05-15-1940, 81 y.o.   MRN: [id number]   Patient here for  Chief Complaint  Patient presents with   Follow-up   .   HPI Here to follow up regarding her blood pressure, cough and cholesterol.  Saw  Dr Caryl Bis 10/2021 - cough and headache.  Felt to be viral respiratory infection.  Covid test negative.  Treated with tessalon perles.  Comes in today - still with some cough and chest congestion.  No chest pain or sob.  Right eye sty - better.  No increased acid reflux.  No abdominal pain.  Bowels moving.  Handling stress.     Past Medical History:  Diagnosis Date   Allergy    Arthritis    Gastroesophageal reflux    Hiatal hernia 1989   status post Nissen fundoplication    Hx of hysterectomy    Hyperlipidemia    Tachycardia    Patient stated that this has been occuring frequently.   Thyroid disease    Goiter   Past Surgical History:  Procedure Laterality Date   ABDOMINAL HYSTERECTOMY  1980   partial   blepheroplasty b/l eyes Bilateral    BREAST BIOPSY Right    neg   BREAST EXCISIONAL BIOPSY Left 2014   neg   COLONOSCOPY  2015   ESOPHAGOGASTRIC FUNDOPLICATION  6503   LAPAROSCOPIC NISSEN FUNDOPLICATION  5465   TONSILLECTOMY     as well as goiter   UPPER GI ENDOSCOPY  2015   Family History  Problem Relation Age of Onset   Stroke Mother 75   Arthritis Mother    Heart disease Mother    Hypertension Mother    Stroke Father 50   Hypertension Father    Rheumatic fever Brother        and multiple open heart surgeries   Diabetes Brother        type 2   Stroke Brother    Other Sister        Coronary Atherosclerosis   Stroke Brother    Stroke Sister    Heart disease Sister    Dementia Sister    Cancer Sister        ovarian   Breast cancer Neg Hx    Social History   Socioeconomic History   Marital status: Married    Spouse  name: Not on file   Number of children: Not on file   Years of education: Not on file   Highest education level: Not on file  Occupational History   Occupation: Retired    Fish farm manager: OTHER  Tobacco Use   Smoking status: Never   Smokeless tobacco: Never  Substance and Sexual Activity   Alcohol use: No    Alcohol/week: 0.0 standard drinks of alcohol   Drug use: No   Sexual activity: Never  Other Topics Concern   Not on file  Social History Narrative   Occasionally drinks coffee.   Social Determinants of Health   Financial Resource Strain: Low Risk  (06/17/2021)   Overall Financial Resource Strain (CARDIA)    Difficulty of Paying Living Expenses: Not hard at all  Food Insecurity: No Food Insecurity (06/17/2021)   Hunger Vital Sign    Worried About Running Out of Food in the Last Year: Never true    Ran Out of Food in the  Last Year: Never true  Transportation Needs: No Transportation Needs (06/17/2021)   PRAPARE - Hydrologist (Medical): No    Lack of Transportation (Non-Medical): No  Physical Activity: Insufficiently Active (06/17/2021)   Exercise Vital Sign    Days of Exercise per Week: 7 days    Minutes of Exercise per Session: 20 min  Stress: No Stress Concern Present (06/17/2021)   [place]    Feeling of Stress : Not at all  Social Connections: Unknown (06/09/2020)   Social Connection and Isolation Panel [NHANES]    Frequency of Communication with Friends and Family: Not on file    Frequency of Social Gatherings with Friends and Family: Not on file    Attends Religious Services: Not on file    Active Member of Clubs or Organizations: Not on file    Attends Archivist Meetings: Not on file    Marital Status: Married     Review of Systems  Constitutional:  Negative for appetite change and fever.  HENT:  Negative for sinus pressure.        No increased nasal  congestion.   Respiratory:  Negative for chest tightness and shortness of breath.        Cough and chest congestion as outlined.    Cardiovascular:  Negative for chest pain, palpitations and leg swelling.  Gastrointestinal:  Negative for abdominal pain, diarrhea, nausea and vomiting.  Genitourinary:  Negative for difficulty urinating and dysuria.  Musculoskeletal:  Negative for joint swelling and myalgias.  Skin:  Negative for color change and rash.  Neurological:  Negative for dizziness and headaches.  Psychiatric/Behavioral:  Negative for agitation and dysphoric mood.        Objective:     BP 130/80 (BP Location: Left Arm, Patient Position: Sitting, Cuff Size: Small)   Pulse 73   Temp 98 F (36.7 C) (Temporal)   Resp 15   Ht _0  (1.6 m)   Wt 135 lb 3.2 oz (61.3 kg)   SpO2 98%   BMI 23.95 kg/m  Wt Readings from Last 3 Encounters:  12/10/21 135 lb 3.2 oz (61.3 kg)  11/12/21 137 lb 3.2 oz (62.2 kg)  10/15/21 137 lb (62.1 kg)    Physical Exam Vitals reviewed.  Constitutional:      General: She is not in acute distress.    Appearance: Normal appearance.  HENT:     Head: Normocephalic and atraumatic.     Right Ear: External ear normal.     Left Ear: External ear normal.  Eyes:     General: No scleral icterus.       Right eye: No discharge.        Left eye: No discharge.     Conjunctiva/sclera: Conjunctivae normal.  Neck:     Thyroid: No thyromegaly.  Cardiovascular:     Rate and Rhythm: Normal rate and regular rhythm.  Pulmonary:     Effort: No respiratory distress.     Breath sounds: Normal breath sounds. No wheezing.  Abdominal:     General: Bowel sounds are normal.     Palpations: Abdomen is soft.     Tenderness: There is no abdominal tenderness.  Musculoskeletal:        General: No swelling or tenderness.     Cervical back: Neck supple. No tenderness.  Lymphadenopathy:     Cervical: No cervical adenopathy.  Skin:    Findings: No erythema or  rash.   Neurological:     Mental Status: She is alert.  Psychiatric:        Mood and Affect: Mood normal.        Behavior: Behavior normal.      Outpatient Encounter Medications as of 12/10/2021  Medication Sig   atorvastatin (LIPITOR) 10 MG tablet One tablet q Monday, Wednesday and Friday.   Azelastine HCl 137 MCG/SPRAY SOLN PLACE 1 SPRAY INTO BOTH NOSTRILS 2 (TWO) TIMES DAILY. USE IN EACH NOSTRIL AS DIRECTED   benzonatate (TESSALON PERLES) 100 MG capsule Take 1 capsule (100 mg total) by mouth 3 (three) times daily as needed for cough.   Biotin 1000 MCG tablet Take 1,000 mcg by mouth 3 (three) times daily. Reported on 05/13/2015   Calcium Carbonate (CALCIUM 600 PO) Take by mouth daily. Reported on 05/13/2015   Cholecalciferol (VITAMIN D PO) Take by mouth daily. Reported on 05/13/2015   estradiol (ESTRACE VAGINAL) 0.1 MG/GM vaginal cream Apply one applicator q hs x 5 nights and then one applicator 2x week.   GEMTESA 75 MG TABS TAKE 1 TABLET BY MOUTH DAILY   levalbuterol (XOPENEX HFA) 45 MCG/ACT inhaler Inhale 2 puffs into the lungs every 6 (six) hours as needed for wheezing.   loratadine (CLARITIN) 5 MG chewable tablet Chew 1 tablet (5 mg total) by mouth daily.   Magnesium Oxide 250 MG TABS Take 1 tablet by mouth daily.   omeprazole (PRILOSEC) 20 MG capsule Take 1 capsule (20 mg total) by mouth 2 (two) times daily before a meal.   polyethylene glycol (MIRALAX) 17 g packet Take 17 g by mouth daily.   Varenicline Tartrate (TYRVAYA) 0.03 MG/ACT SOLN Place into the nose.   vitamin B-12 (CYANOCOBALAMIN) 1000 MCG tablet Take 1,000 mcg by mouth daily. Reported on 05/13/2015   [DISCONTINUED] benzonatate (TESSALON) 200 MG capsule Take 1 capsule (200 mg total) by mouth 2 (two) times daily as needed for cough.   Facility-Administered Encounter Medications as of 12/10/2021  Medication   betamethasone acetate-betamethasone sodium phosphate (CELESTONE) injection 3 mg   betamethasone acetate-betamethasone  sodium phosphate (CELESTONE) injection 3 mg     Lab Results  Component Value Date   WBC 7.4 06/30/2021   HGB 13.2 06/30/2021   HCT 38.5 06/30/2021   PLT 368.0 06/30/2021   GLUCOSE 96 10/11/2021   CHOL 240 (H) 10/11/2021   TRIG 114.0 10/11/2021   HDL 58.00 10/11/2021   LDLDIRECT 141.0 09/01/2016   LDLCALC 159 (H) 10/11/2021   ALT 12 10/11/2021   AST 17 10/11/2021   NA 140 10/11/2021   K 4.4 10/11/2021   CL 104 10/11/2021   CREATININE 1.03 10/11/2021   BUN 16 10/11/2021   CO2 28 10/11/2021   TSH 4.26 06/30/2021   HGBA1C 5.8 10/11/2021       Assessment & Plan:   Problem List Items Addressed This Visit     Aortic atherosclerosis ([place])    Continue lipitor.       Benign essential HTN (Chronic)    Blood pressure as outlined.  Follow pressures.       Relevant Orders   Basic metabolic panel   CKD (chronic kidney disease) stage 3, GFR 30-59 ml/min (HCC)    Continue to avoid antiinflammatories.  Stay hydrated.  Follow.  Follow metabolic panel.       Cough - Primary    She is doing better.  Still some cough.  Hold on abx.  Will extend out tessalon perles.  Xopenex inhaler as  directed.  Follow.  Call with update.        Relevant Orders   DG Chest 2 View   GERD (gastroesophageal reflux disease) (Chronic)    Continue prilosec.  Acid reflux controlled.        Hypercholesterolemia    Continue lipitor.  Low cholesterol diet and exercise.  Follow lipid panel and liver function tests.        Relevant Orders   Hepatic function panel   Lipid panel   Hyperglycemia    Low carb diet and exercise.  Follow met b and a1c.       Stress    Continue zoloft.       Thyroid nodule (Chronic)    Saw Dr Honor Junes 06/10/21 - recommended f/u ultrasound in 2 years.        Relevant Orders   TSH   T4, free   Vitamin D deficiency    Follow vitamin d level       Relevant Orders   VITAMIN D 25 Hydroxy (Vit-D Deficiency, Fractures)     Einar Pheasant, MD

## 2021-12-12 ENCOUNTER — Encounter: Payer: Self-pay | Admitting: Internal Medicine

## 2021-12-12 NOTE — Assessment & Plan Note (Signed)
Low carb diet and exercise.  Follow met b and a1c.  

## 2021-12-12 NOTE — Assessment & Plan Note (Signed)
Continue prilosec.  Acid reflux controlled.

## 2021-12-12 NOTE — Assessment & Plan Note (Signed)
Continue lipitor  ?

## 2021-12-12 NOTE — Assessment & Plan Note (Signed)
She is doing better.  Still some cough.  Hold on abx.  Will extend out tessalon perles.  Xopenex inhaler as directed.  Follow.  Call with update.

## 2021-12-12 NOTE — Assessment & Plan Note (Signed)
Continue to avoid antiinflammatories.  Stay hydrated.  Follow.  Follow metabolic panel.  

## 2021-12-12 NOTE — Assessment & Plan Note (Signed)
Saw Dr O'Connell 06/10/21 - recommended f/u ultrasound in 2 years.   

## 2021-12-12 NOTE — Assessment & Plan Note (Signed)
Continue lipitor.  Low cholesterol diet and exercise.  Follow lipid panel and liver function tests.   

## 2021-12-12 NOTE — Assessment & Plan Note (Addendum)
Blood pressure as outlined.  Follow pressures.  

## 2021-12-12 NOTE — Assessment & Plan Note (Signed)
Follow vitamin d level.   

## 2021-12-12 NOTE — Assessment & Plan Note (Signed)
-  Continue zoloft  

## 2021-12-13 ENCOUNTER — Telehealth: Payer: Self-pay

## 2021-12-13 NOTE — Telephone Encounter (Signed)
  LM FOR PT TO CB Re:   Notify Carrie Noble that her cxr reveals no acute abnormality.  Lungs are clear.

## 2022-01-27 ENCOUNTER — Other Ambulatory Visit (INDEPENDENT_AMBULATORY_CARE_PROVIDER_SITE_OTHER): Payer: Medicare Other

## 2022-01-27 DIAGNOSIS — E041 Nontoxic single thyroid nodule: Secondary | ICD-10-CM | POA: Diagnosis not present

## 2022-01-27 DIAGNOSIS — I10 Essential (primary) hypertension: Secondary | ICD-10-CM

## 2022-01-27 DIAGNOSIS — E559 Vitamin D deficiency, unspecified: Secondary | ICD-10-CM | POA: Diagnosis not present

## 2022-01-27 DIAGNOSIS — E7800 Pure hypercholesterolemia, unspecified: Secondary | ICD-10-CM

## 2022-01-27 LAB — LIPID PANEL
Cholesterol: 235 mg/dL — ABNORMAL HIGH (ref 0–200)
HDL: 67.1 mg/dL (ref >39.00)
LDL Cholesterol: 148 mg/dL — ABNORMAL HIGH (ref 0–99)
NonHDL: 168.17
Total CHOL/HDL Ratio: 4
Triglycerides: 103 mg/dL (ref 0.0–149.0)
VLDL: 20.6 mg/dL (ref 0.0–40.0)

## 2022-01-27 LAB — VITAMIN D 25 HYDROXY (VIT D DEFICIENCY, FRACTURES): VITD: 23.08 ng/mL — ABNORMAL LOW (ref 30.00–100.00)

## 2022-01-27 LAB — HEPATIC FUNCTION PANEL
ALT: 18 U/L (ref 0–35)
AST: 22 U/L (ref 0–37)
Albumin: 4.5 g/dL (ref 3.5–5.2)
Alkaline Phosphatase: 115 U/L (ref 39–117)
Bilirubin, Direct: 0.1 mg/dL (ref 0.0–0.3)
Total Bilirubin: 0.7 mg/dL (ref 0.2–1.2)
Total Protein: 7.2 g/dL (ref 6.0–8.3)

## 2022-01-27 LAB — BASIC METABOLIC PANEL
BUN: 14 mg/dL (ref 6–23)
CO2: 31 mEq/L (ref 19–32)
Calcium: 10 mg/dL (ref 8.4–10.5)
Chloride: 102 mEq/L (ref 96–112)
Creatinine, Ser: 0.94 mg/dL (ref 0.40–1.20)
GFR: 56.43 mL/min — ABNORMAL LOW (ref >60.00)
Glucose, Bld: 92 mg/dL (ref 70–99)
Potassium: 4.5 mEq/L (ref 3.5–5.1)
Sodium: 141 mEq/L (ref 135–145)

## 2022-01-27 LAB — T4, FREE: Free T4: 0.91 ng/dL (ref 0.60–1.60)

## 2022-01-27 LAB — TSH: TSH: 4.1 u[IU]/mL (ref 0.35–5.50)

## 2022-02-01 ENCOUNTER — Encounter: Payer: Self-pay | Admitting: Internal Medicine

## 2022-02-01 ENCOUNTER — Ambulatory Visit (INDEPENDENT_AMBULATORY_CARE_PROVIDER_SITE_OTHER): Payer: Medicare Other | Admitting: Internal Medicine

## 2022-02-01 VITALS — BP 138/86 | HR 85 | Temp 98.5°F | Resp 15 | Ht 63.0 in | Wt 134.4 lb

## 2022-02-01 DIAGNOSIS — R059 Cough, unspecified: Secondary | ICD-10-CM

## 2022-02-01 DIAGNOSIS — E7800 Pure hypercholesterolemia, unspecified: Secondary | ICD-10-CM

## 2022-02-01 DIAGNOSIS — I10 Essential (primary) hypertension: Secondary | ICD-10-CM | POA: Diagnosis not present

## 2022-02-01 DIAGNOSIS — I700 Atherosclerosis of aorta: Secondary | ICD-10-CM

## 2022-02-01 DIAGNOSIS — K219 Gastro-esophageal reflux disease without esophagitis: Secondary | ICD-10-CM

## 2022-02-01 DIAGNOSIS — F439 Reaction to severe stress, unspecified: Secondary | ICD-10-CM

## 2022-02-01 DIAGNOSIS — R350 Frequency of micturition: Secondary | ICD-10-CM

## 2022-02-01 DIAGNOSIS — D75839 Thrombocytosis, unspecified: Secondary | ICD-10-CM

## 2022-02-01 DIAGNOSIS — N1831 Chronic kidney disease, stage 3a: Secondary | ICD-10-CM

## 2022-02-01 DIAGNOSIS — R739 Hyperglycemia, unspecified: Secondary | ICD-10-CM

## 2022-02-01 DIAGNOSIS — E041 Nontoxic single thyroid nodule: Secondary | ICD-10-CM

## 2022-02-01 MED ORDER — SERTRALINE HCL 25 MG PO TABS: 25.0000 mg | ORAL_TABLET | Freq: Every day | ORAL | 2 refills | Status: DC

## 2022-02-01 NOTE — Patient Instructions (Signed)
Take the prilosec daily - 30 minutes before breakfast.    Take vitamin D daily.   Take the cholesterol medication daily.

## 2022-02-01 NOTE — Progress Notes (Signed)
Subjective:    Patient ID: Carrie Noble, female    DOB: 1940-10-25, 82 y.o.   MRN: [id number]  Patient here for  Chief Complaint  Patient presents with   Medical Management of Chronic Issues   Gastroesophageal Reflux    HPI Here to follow up regarding her blood pressure, cholesterol and GERD.  She was seen recently for persistent cough.  Covid negative.  Treated with tessalon perles.  Still with some congestion/cough, but feels may be related to acid reflux.  Discussed diet adjustment - avoid foods that aggravate and avoid eating late.  Has decreased meat intake.  No chest pain reported.  Sleeps in chair.  Taking PPI.  No chest pain.  Breathing overall stable.  Increased stress with husband's health issues.  He fell recently.  Logan Bores helps bladder issues.  Estrogen helps.     Past Medical History:  Diagnosis Date   Allergy    Arthritis    Gastroesophageal reflux    Hiatal hernia 1989   status post Nissen fundoplication    Hx of hysterectomy    Hyperlipidemia    Tachycardia    Patient stated that this has been occuring frequently.   Thyroid disease    Goiter   Past Surgical History:  Procedure Laterality Date   ABDOMINAL HYSTERECTOMY  1980   partial   blepheroplasty b/l eyes Bilateral    BREAST BIOPSY Right    neg   BREAST EXCISIONAL BIOPSY Left 2014   neg   COLONOSCOPY  2015   ESOPHAGOGASTRIC FUNDOPLICATION  4098   LAPAROSCOPIC NISSEN FUNDOPLICATION  1191   TONSILLECTOMY     as well as goiter   UPPER GI ENDOSCOPY  2015   Family History  Problem Relation Age of Onset   Stroke Mother 30   Arthritis Mother    Heart disease Mother    Hypertension Mother    Stroke Father 29   Hypertension Father    Rheumatic fever Brother        and multiple open heart surgeries   Diabetes Brother        type 2   Stroke Brother    Other Sister        Coronary Atherosclerosis   Stroke Brother    Stroke Sister    Heart disease Sister    Dementia Sister    Cancer Sister         ovarian   Breast cancer Neg Hx    Social History   Socioeconomic History   Marital status: Married    Spouse name: Not on file   Number of children: Not on file   Years of education: Not on file   Highest education level: Not on file  Occupational History   Occupation: Retired    Fish farm manager: OTHER  Tobacco Use   Smoking status: Never   Smokeless tobacco: Never  Substance and Sexual Activity   Alcohol use: No    Alcohol/week: 0.0 standard drinks of alcohol   Drug use: No   Sexual activity: Never  Other Topics Concern   Not on file  Social History Narrative   Occasionally drinks coffee.   Social Determinants of Health   Financial Resource Strain: Low Risk  (06/17/2021)   Overall Financial Resource Strain (CARDIA)    Difficulty of Paying Living Expenses: Not hard at all  Food Insecurity: No Food Insecurity (06/17/2021)   Hunger Vital Sign    Worried About Running Out of Food in the Last Year: Never true  Ran Out of Food in the Last Year: Never true  Transportation Needs: No Transportation Needs (06/17/2021)   PRAPARE - Administrator, Civil Service (Medical): No    Lack of Transportation (Non-Medical): No  Physical Activity: Insufficiently Active (06/17/2021)   Exercise Vital Sign    Days of Exercise per Week: 7 days    Minutes of Exercise per Session: 20 min  Stress: No Stress Concern Present (06/17/2021)   [employer] of Occupational Health - Occupational Stress Questionnaire    Feeling of Stress : Not at all  Social Connections: Unknown (06/09/2020)   Social Connection and Isolation Panel [NHANES]    Frequency of Communication with Friends and Family: Not on file    Frequency of Social Gatherings with Friends and Family: Not on file    Attends Religious Services: Not on file    Active Member of Clubs or Organizations: Not on file    Attends Banker Meetings: Not on file    Marital Status: Married     Review of Systems   Constitutional:  Negative for appetite change and unexpected weight change.  HENT:  Negative for congestion and sinus pressure.   Respiratory:  Negative for cough, chest tightness and shortness of breath.   Cardiovascular:  Negative for chest pain, palpitations and leg swelling.  Gastrointestinal:  Negative for abdominal pain, nausea and vomiting.  Genitourinary:  Negative for difficulty urinating and dysuria.  Musculoskeletal:  Negative for joint swelling and myalgias.  Skin:  Negative for color change and rash.  Neurological:  Negative for dizziness and headaches.  Psychiatric/Behavioral:  Negative for agitation and dysphoric mood.        Objective:     BP 138/86 (BP Location: Left Arm, Patient Position: Sitting, Cuff Size: Small)   Pulse 85   Temp 98.5 F (36.9 C) (Temporal)   Resp 15   Ht 5\' 3"  (1.6 m)   Wt 134 lb 6.4 oz (61 kg)   SpO2 98%   BMI 23.81 kg/m  Wt Readings from Last 3 Encounters:  02/01/22 134 lb 6.4 oz (61 kg)  12/10/21 135 lb 3.2 oz (61.3 kg)  11/12/21 137 lb 3.2 oz (62.2 kg)    Physical Exam Vitals reviewed.  Constitutional:      General: She is not in acute distress.    Appearance: Normal appearance.  HENT:     Head: Normocephalic and atraumatic.     Right Ear: External ear normal.     Left Ear: External ear normal.  Eyes:     General: No scleral icterus.       Right eye: No discharge.        Left eye: No discharge.     Conjunctiva/sclera: Conjunctivae normal.  Neck:     Thyroid: No thyromegaly.  Cardiovascular:     Rate and Rhythm: Normal rate and regular rhythm.  Pulmonary:     Effort: No respiratory distress.     Breath sounds: Normal breath sounds. No wheezing.  Abdominal:     General: Bowel sounds are normal.     Palpations: Abdomen is soft.     Tenderness: There is no abdominal tenderness.  Musculoskeletal:        General: No swelling or tenderness.     Cervical back: Neck supple. No tenderness.  Lymphadenopathy:     Cervical:  No cervical adenopathy.  Skin:    Findings: No erythema or rash.  Neurological:     Mental Status: She is alert.  Psychiatric:        Mood and Affect: Mood normal.        Behavior: Behavior normal.      Outpatient Encounter Medications as of 02/01/2022  Medication Sig   atorvastatin (LIPITOR) 10 MG tablet One tablet q Monday, Wednesday and Friday.   Azelastine HCl 137 MCG/SPRAY SOLN PLACE 1 SPRAY INTO BOTH NOSTRILS 2 (TWO) TIMES DAILY. USE IN EACH NOSTRIL AS DIRECTED   benzonatate (TESSALON PERLES) 100 MG capsule Take 1 capsule (100 mg total) by mouth 3 (three) times daily as needed for cough.   Biotin 1000 MCG tablet Take 1,000 mcg by mouth 3 (three) times daily. Reported on 05/13/2015   Calcium Carbonate (CALCIUM 600 PO) Take by mouth daily. Reported on 05/13/2015   Cholecalciferol (VITAMIN D PO) Take by mouth daily. Reported on 05/13/2015   estradiol (ESTRACE VAGINAL) 0.1 MG/GM vaginal cream Apply one applicator q hs x 5 nights and then one applicator 2x week.   GEMTESA 75 MG TABS TAKE 1 TABLET BY MOUTH DAILY   levalbuterol (XOPENEX HFA) 45 MCG/ACT inhaler Inhale 2 puffs into the lungs every 6 (six) hours as needed for wheezing.   loratadine (CLARITIN) 5 MG chewable tablet Chew 1 tablet (5 mg total) by mouth daily.   Magnesium Oxide 250 MG TABS Take 1 tablet by mouth daily.   omeprazole (PRILOSEC) 20 MG capsule Take 1 capsule (20 mg total) by mouth 2 (two) times daily before a meal.   polyethylene glycol (MIRALAX) 17 g packet Take 17 g by mouth daily.   sertraline (ZOLOFT) 25 MG tablet Take 1 tablet (25 mg total) by mouth daily.   Varenicline Tartrate (TYRVAYA) 0.03 MG/ACT SOLN Place into the nose.   vitamin B-12 (CYANOCOBALAMIN) 1000 MCG tablet Take 1,000 mcg by mouth daily. Reported on 05/13/2015   Facility-Administered Encounter Medications as of 02/01/2022  Medication   betamethasone acetate-betamethasone sodium phosphate (CELESTONE) injection 3 mg   betamethasone  acetate-betamethasone sodium phosphate (CELESTONE) injection 3 mg     Lab Results  Component Value Date   WBC 7.4 06/30/2021   HGB 13.2 06/30/2021   HCT 38.5 06/30/2021   PLT 368.0 06/30/2021   GLUCOSE 92 01/27/2022   CHOL 235 (H) 01/27/2022   TRIG 103.0 01/27/2022   HDL 67.10 01/27/2022   LDLDIRECT 141.0 09/01/2016   LDLCALC 148 (H) 01/27/2022   ALT 18 01/27/2022   AST 22 01/27/2022   NA 141 01/27/2022   K 4.5 01/27/2022   CL 102 01/27/2022   CREATININE 0.94 01/27/2022   BUN 14 01/27/2022   CO2 31 01/27/2022   TSH 4.10 01/27/2022   HGBA1C 5.8 10/11/2021    No results found.     Assessment & Plan:  Hypercholesterolemia Assessment & Plan: Continue lipitor.  Low cholesterol diet and exercise.  Follow lipid panel and liver function tests.  Discussed recent labs and increase.  Not taking her medication as directed.  Discussed need to take regularly.     Aortic atherosclerosis (HCC) Assessment & Plan: Continue lipitor.    Benign essential HTN Assessment & Plan: Blood pressure as outlined.  Follow pressures.    Stage 3a chronic kidney disease (HCC) Assessment & Plan: Continue to avoid antiinflammatories.  Stay hydrated.  Follow.  Follow metabolic panel.    Cough, unspecified type Assessment & Plan: She is doing better.  Still some cough.  Has Xopenex inhaler as directed.  Discussed acid reflux.  Prilosec bid.  Discussed referral back to GI and/or pulmonary.  Has  f/u planned with GI soon. Overall appears to be improved.    Gastroesophageal reflux disease, unspecified whether esophagitis present Assessment & Plan: Continue prilosec.  Needs to take regularly as prescribed.  Has not been taking as prescribed. Question if triggering cough a outlined.  Needs f/u with GI.  Has f/u scheduled soon.  Plans to discuss.    Hyperglycemia Assessment & Plan: Low carb diet and exercise.  Follow met b and a1c.    Stress Assessment & Plan: Increased stress.  Discussed.   Does not feel needs any further intervention.  Follow. Continue zoloft.    Thrombocytosis Assessment & Plan: Follow cbc.    Thyroid nodule Assessment & Plan: Saw Dr Gershon Crane 06/10/21 - recommended f/u ultrasound in 2 years.     Urinary frequency Assessment & Plan: Persistent increased urinary frequency.  Has seen urology.  Was given trial of gemtesa.  Helped.    Other orders -     Sertraline HCl; Take 1 tablet (25 mg total) by mouth daily.  Dispense: 30 tablet; Refill: 2     Dale Los Ebanos, MD

## 2022-02-06 ENCOUNTER — Encounter: Payer: Self-pay | Admitting: Internal Medicine

## 2022-02-06 NOTE — Assessment & Plan Note (Addendum)
Continue prilosec.  Needs to take regularly as prescribed.  Has not been taking as prescribed. Question if triggering cough a outlined.  Needs f/u with GI.  Has f/u scheduled soon.  Plans to discuss.

## 2022-02-06 NOTE — Assessment & Plan Note (Signed)
Increased stress.  Discussed.  Does not feel needs any further intervention.  Follow. Continue zoloft.

## 2022-02-06 NOTE — Assessment & Plan Note (Signed)
Saw Dr Honor Junes 06/10/21 - recommended f/u ultrasound in 2 years.

## 2022-02-06 NOTE — Assessment & Plan Note (Signed)
Continue to avoid antiinflammatories.  Stay hydrated.  Follow.  Follow metabolic panel.

## 2022-02-06 NOTE — Assessment & Plan Note (Addendum)
Continue lipitor.  Low cholesterol diet and exercise.  Follow lipid panel and liver function tests.  Discussed recent labs and increase.  Not taking her medication as directed.  Discussed need to take regularly.

## 2022-02-06 NOTE — Assessment & Plan Note (Signed)
Persistent increased urinary frequency.  Has seen urology.  Was given trial of gemtesa.  Helped.

## 2022-02-06 NOTE — Assessment & Plan Note (Addendum)
She is doing better.  Still some cough.  Has Xopenex inhaler as directed.  Discussed acid reflux.  Prilosec bid.  Discussed referral back to GI and/or pulmonary.  Has f/u planned with GI soon. Overall appears to be improved.

## 2022-02-06 NOTE — Assessment & Plan Note (Signed)
Low carb diet and exercise.  Follow met b and a1c.  

## 2022-02-06 NOTE — Assessment & Plan Note (Signed)
Blood pressure as outlined.  Follow pressures.

## 2022-02-06 NOTE — Assessment & Plan Note (Signed)
Follow [facility]bc.  

## 2022-02-06 NOTE — Assessment & Plan Note (Signed)
Continue lipitor.

## 2022-02-24 ENCOUNTER — Other Ambulatory Visit: Payer: Self-pay | Admitting: Internal Medicine

## 2022-04-29 ENCOUNTER — Other Ambulatory Visit: Payer: Self-pay | Admitting: Internal Medicine

## 2022-04-29 ENCOUNTER — Telehealth: Payer: Self-pay | Admitting: Internal Medicine

## 2022-04-29 DIAGNOSIS — Z1231 Encounter for screening mammogram for malignant neoplasm of breast: Secondary | ICD-10-CM

## 2022-04-29 DIAGNOSIS — K219 Gastro-esophageal reflux disease without esophagitis: Secondary | ICD-10-CM

## 2022-04-29 NOTE — Telephone Encounter (Signed)
Pt called wanting to know if she need to go back and see a GI doctor because she has dryness and burning in her throat and she is also nauseated. Pt want to know if there is another GI doctor she can go to beside kristin landen

## 2022-05-02 NOTE — Telephone Encounter (Signed)
Patient would like to see someone in GI, [place], DO Not schedule [facility]. Patient stated she was already at [facility] and they would not take pictures because they were done last year.  Patient wanted Dr Lorin Picket to know she is having dry mouth. Patient did not know if she needed to double up on her medication.

## 2022-05-02 NOTE — Telephone Encounter (Signed)
LMTCB

## 2022-05-03 NOTE — Telephone Encounter (Signed)
Pt called in, in regards of previous message. Pt would like to phone call from [place] LPN anytime today.

## 2022-05-03 NOTE — Addendum Note (Signed)
Addended by: Rita Ohara D on: 05/03/2022 03:15 PM   Modules accepted: Orders

## 2022-05-03 NOTE — Telephone Encounter (Signed)
Ok for referral - [facility].   Need to clarify what stomach issues she is currently having.  Is dry mouth the only symptom (for ENT).  Has she talked to her dentist regarding dry mouth.

## 2022-05-03 NOTE — Telephone Encounter (Signed)
Patient is wanting to see a different GI doctor in [place] for her persistent stomach issues. Currently followed by Gavin Potters. She is also having increased dry mouth and was wondering if she should see an ENT doctor. Ok to place referrals for her or do you want to see her first?

## 2022-05-03 NOTE — Telephone Encounter (Signed)
Pt left another to call her at [phone]

## 2022-05-03 NOTE — Telephone Encounter (Signed)
FYI   GI referral placed. She is having persistent acid reflux. Also- she is going to call her dentist about her dry mouth. If no recommendations from them- agreed to set up appt to discuss

## 2022-05-04 NOTE — Telephone Encounter (Signed)
FYI for you. Seeing the dentist for her dry mouth

## 2022-05-04 NOTE — Telephone Encounter (Signed)
Patient wanted to let Dr Lorin Picket know she has a dentist appointment next month.

## 2022-05-18 ENCOUNTER — Ambulatory Visit
Admission: RE | Admit: 2022-05-18 | Discharge: 2022-05-18 | Disposition: A | Discharge status: Home / Self Care | Payer: Medicare Other | Source: Ambulatory Visit | Attending: Internal Medicine | Admitting: Internal Medicine

## 2022-05-18 DIAGNOSIS — Z1231 Encounter for screening mammogram for malignant neoplasm of breast: Secondary | ICD-10-CM | POA: Insufficient documentation

## 2022-05-20 ENCOUNTER — Other Ambulatory Visit: Payer: Self-pay | Admitting: Internal Medicine

## 2022-05-20 DIAGNOSIS — R928 Other abnormal and inconclusive findings on diagnostic imaging of breast: Secondary | ICD-10-CM

## 2022-05-20 DIAGNOSIS — N6489 Other specified disorders of breast: Secondary | ICD-10-CM

## 2022-05-21 ENCOUNTER — Other Ambulatory Visit: Payer: Self-pay | Admitting: Internal Medicine

## 2022-05-21 DIAGNOSIS — R928 Other abnormal and inconclusive findings on diagnostic imaging of breast: Secondary | ICD-10-CM

## 2022-05-21 NOTE — Progress Notes (Signed)
Order placed for bilateral diagnostic mammogram and ultrasounds.  

## 2022-05-24 ENCOUNTER — Ambulatory Visit
Admission: RE | Admit: 2022-05-24 | Discharge: 2022-05-24 | Disposition: A | Discharge status: Home / Self Care | Payer: Medicare Other | Source: Ambulatory Visit | Attending: Internal Medicine | Admitting: Internal Medicine

## 2022-05-24 DIAGNOSIS — R928 Other abnormal and inconclusive findings on diagnostic imaging of breast: Secondary | ICD-10-CM

## 2022-05-24 DIAGNOSIS — N6489 Other specified disorders of breast: Secondary | ICD-10-CM

## 2022-05-25 ENCOUNTER — Telehealth: Payer: Self-pay

## 2022-05-25 NOTE — Telephone Encounter (Signed)
Noted  

## 2022-05-25 NOTE — Telephone Encounter (Signed)
Pt called back and I read the message to her and she verbalized understanding 

## 2022-05-25 NOTE — Telephone Encounter (Signed)
-----   Message from Dale [place], MD sent at 05/25/2022  5:12 AM EDT ----- Please call and notify - her f/u mammogram and ultrasound - ok.  Recommended continuing yearly screening mammogram.

## 2022-06-21 ENCOUNTER — Other Ambulatory Visit: Payer: Self-pay | Admitting: Internal Medicine

## 2022-06-22 ENCOUNTER — Telehealth: Payer: Self-pay | Admitting: Internal Medicine

## 2022-06-22 NOTE — Telephone Encounter (Addendum)
pt called stating she would like to drop off a specimen because she think she has a bladder infection [phone]

## 2022-06-22 NOTE — Telephone Encounter (Signed)
Advised that visit is needed in order to leave urine. Patient has been scheduled for tomorrow.

## 2022-06-23 ENCOUNTER — Encounter: Payer: Self-pay | Admitting: Nurse Practitioner

## 2022-06-23 ENCOUNTER — Ambulatory Visit (INDEPENDENT_AMBULATORY_CARE_PROVIDER_SITE_OTHER): Payer: Medicare Other | Admitting: Nurse Practitioner

## 2022-06-23 VITALS — BP 148/70 | HR 83 | Temp 97.4°F | Ht 63.0 in | Wt 131.6 lb

## 2022-06-23 DIAGNOSIS — R300 Dysuria: Secondary | ICD-10-CM

## 2022-06-23 LAB — POCT URINALYSIS DIPSTICK
Bilirubin, UA: NEGATIVE
Glucose, UA: NEGATIVE
Ketones, UA: NEGATIVE
Nitrite, UA: POSITIVE
Protein, UA: POSITIVE — AB
Spec Grav, UA: 1.02 (ref 1.010–1.025)
Urobilinogen, UA: 0.2 E.U./dL
pH, UA: 5.5 (ref 5.0–8.0)

## 2022-06-23 LAB — URINALYSIS, MICROSCOPIC ONLY

## 2022-06-23 MED ORDER — CEPHALEXIN 500 MG PO CAPS: 500.0000 mg | ORAL_CAPSULE | Freq: Two times a day (BID) | ORAL | 0 refills | Status: AC

## 2022-06-23 NOTE — Patient Instructions (Signed)
Rx sent to pharmacy. Will call with culture result. Increase fluid intake and take AZO for pain

## 2022-06-23 NOTE — Progress Notes (Signed)
Established Patient Office Visit  Subjective:  Patient ID: Carrie Noble, female    DOB: [birth year]  Age: 82 y.o. MRN: [id number]  CC:  Chief Complaint  Patient presents with   Dysuria    X 3 days    HPI  Carrie Noble presents for:  Dysuria  This is a new problem. The current episode started in the past 7 days. The problem occurs intermittently. The problem has been unchanged. The quality of the pain is described as aching. There has been no fever. Associated symptoms include frequency. Pertinent negatives include no chills, discharge, flank pain, hesitancy or urgency. Treatments tried: AZO.     Past Medical History:  Diagnosis Date   Allergy    Arthritis    Gastroesophageal reflux    Hiatal hernia 1989   status post Nissen fundoplication    Hx of hysterectomy    Hyperlipidemia    Tachycardia    Patient stated that this has been occuring frequently.   Thyroid disease    Goiter    Past Surgical History:  Procedure Laterality Date   ABDOMINAL HYSTERECTOMY  1980   partial   blepheroplasty b/l eyes Bilateral    BREAST BIOPSY Right    neg   BREAST EXCISIONAL BIOPSY Left 2014   neg   COLONOSCOPY  2015   ESOPHAGOGASTRIC FUNDOPLICATION  1999   LAPAROSCOPIC NISSEN FUNDOPLICATION  1999   TONSILLECTOMY     as well as goiter   UPPER GI ENDOSCOPY  2015    Family History  Problem Relation Age of Onset   Stroke Mother 32   Arthritis Mother    Heart disease Mother    Hypertension Mother    Stroke Father 43   Hypertension Father    Rheumatic fever Brother        and multiple open heart surgeries   Diabetes Brother        type 2   Stroke Brother    Other Sister        Coronary Atherosclerosis   Stroke Brother    Stroke Sister    Heart disease Sister    Dementia Sister    Cancer Sister        ovarian   Breast cancer Neg Hx     Social History   Socioeconomic History   Marital status: Married    Spouse name: Not on file   Number of children: Not on file    Years of education: Not on file   Highest education level: Not on file  Occupational History   Occupation: Retired    Associate Professor: [employer]  Tobacco Use   Smoking status: Never   Smokeless tobacco: Never  Substance and Sexual Activity   Alcohol use: No    Alcohol/week: 0.0 standard drinks of alcohol   Drug use: No   Sexual activity: Never  Other Topics Concern   Not on file  Social History Narrative   Occasionally drinks coffee.   Social Determinants of Health   Financial Resource Strain: Low Risk  (06/17/2021)   Overall Financial Resource Strain (CARDIA)    Difficulty of Paying Living Expenses: Not hard at all  Food Insecurity: No Food Insecurity (06/17/2021)   Hunger Vital Sign    Worried About Running Out of Food in the Last Year: Never true    Ran Out of Food in the Last Year: Never true  Transportation Needs: No Transportation Needs (06/17/2021)   PRAPARE - Transportation    Lack of Transportation (  Medical): No    Lack of Transportation (Non-Medical): No  Physical Activity: Insufficiently Active (06/17/2021)   Exercise Vital Sign    Days of Exercise per Week: 7 days    Minutes of Exercise per Session: 20 min  Stress: No Stress Concern Present (06/17/2021)   [employer] of Occupational Health - Occupational Stress Questionnaire    Feeling of Stress : Not at all  Social Connections: Unknown (06/09/2020)   Social Connection and Isolation Panel [NHANES]    Frequency of Communication with Friends and Family: Not on file    Frequency of Social Gatherings with Friends and Family: Not on file    Attends Religious Services: Not on file    Active Member of Clubs or Organizations: Not on file    Attends Banker Meetings: Not on file    Marital Status: Married  Intimate Partner Violence: Not At Risk (06/17/2021)   Humiliation, Afraid, Rape, and Kick questionnaire    Fear of Current or Ex-Partner: No    Emotionally Abused: No    Physically Abused: No    Sexually  Abused: No     Outpatient Medications Prior to Visit  Medication Sig Dispense Refill   atorvastatin (LIPITOR) 10 MG tablet One tablet q Monday, Wednesday and Friday. 39 tablet 1   Azelastine HCl 137 MCG/SPRAY SOLN PLACE 1 SPRAY INTO BOTH NOSTRILS 2 (TWO) TIMES DAILY. USE IN EACH NOSTRIL AS DIRECTED 30 mL 1   benzonatate (TESSALON PERLES) 100 MG capsule Take 1 capsule (100 mg total) by mouth 3 (three) times daily as needed for cough. 21 capsule 0   Biotin 1000 MCG tablet Take 1,000 mcg by mouth 3 (three) times daily. Reported on 05/13/2015     Calcium Carbonate (CALCIUM 600 PO) Take by mouth daily. Reported on 05/13/2015     Cholecalciferol (VITAMIN D PO) Take by mouth daily. Reported on 05/13/2015     estradiol (ESTRACE VAGINAL) 0.1 MG/GM vaginal cream Apply one applicator q hs x 5 nights and then one applicator 2x week. 42.5 g 2   GEMTESA 75 MG TABS TAKE 1 TABLET BY MOUTH DAILY 90 tablet 2   levalbuterol (XOPENEX HFA) 45 MCG/ACT inhaler Inhale 2 puffs into the lungs every 6 (six) hours as needed for wheezing. 1 each 0   loratadine (CLARITIN) 5 MG chewable tablet Chew 1 tablet (5 mg total) by mouth daily. 30 tablet 2   Magnesium Oxide 250 MG TABS Take 1 tablet by mouth daily.     omeprazole (PRILOSEC) 20 MG capsule TAKE 1 CAPSULE (20 MG TOTAL) BY MOUTH 2 (TWO) TIMES DAILY BEFORE A MEAL. 180 capsule 3   polyethylene glycol (MIRALAX) 17 g packet Take 17 g by mouth daily. 14 each 0   sertraline (ZOLOFT) 25 MG tablet TAKE 1 TABLET (25 MG TOTAL) BY MOUTH DAILY. 90 tablet 1   Varenicline Tartrate (TYRVAYA) 0.03 MG/ACT SOLN Place into the nose.     vitamin B-12 (CYANOCOBALAMIN) 1000 MCG tablet Take 1,000 mcg by mouth daily. Reported on 05/13/2015     Facility-Administered Medications Prior to Visit  Medication Dose Route Frequency Provider Last Rate Last Admin   betamethasone acetate-betamethasone sodium phosphate (CELESTONE) injection 3 mg  3 mg Intramuscular Once Gala Lewandowsky M, DPM        betamethasone acetate-betamethasone sodium phosphate (CELESTONE) injection 3 mg  3 mg Intramuscular Once Felecia Shelling, DPM        Allergies  Allergen Reactions   Darvocet [Propoxyphene N-Acetaminophen]  Patient stated that medication made her heart beat fast.   Morphine And Codeine     Patient stated that medication made her heart beat fast.   Sucralfate Nausea Only    congestion    ROS Review of Systems  Constitutional:  Negative for chills.  Genitourinary:  Positive for dysuria and frequency. Negative for flank pain, hesitancy and urgency.      Objective:    Physical Exam Constitutional:      Appearance: Normal appearance.  Cardiovascular:     Rate and Rhythm: Normal rate and regular rhythm.     Pulses: Normal pulses.     Heart sounds: Normal heart sounds. No murmur heard. Pulmonary:     Effort: Pulmonary effort is normal.     Breath sounds: No stridor. No wheezing.  Abdominal:     Tenderness: There is abdominal tenderness in the suprapubic area. There is no right CVA tenderness or left CVA tenderness.  Skin:    Capillary Refill: Capillary refill takes less than 2 seconds.  Neurological:     General: No focal deficit present.     Mental Status: She is alert and oriented to person, place, and time. Mental status is at baseline.  Psychiatric:        Mood and Affect: Mood normal.        Behavior: Behavior normal.        Thought Content: Thought content normal.        Judgment: Judgment normal.     BP (!) 148/70   Pulse 83   Temp (!) 97.4 F (36.3 C) (Oral)   Ht 5\' 3"  (1.6 m)   Wt 131 lb 9.6 oz (59.7 kg)   SpO2 98%   BMI 23.31 kg/m  Wt Readings from Last 3 Encounters:  06/23/22 131 lb 9.6 oz (59.7 kg)  02/01/22 134 lb 6.4 oz (61 kg)  12/10/21 135 lb 3.2 oz (61.3 kg)     Health Maintenance  Topic Date Due   Zoster Vaccines- Shingrix (1 of 2) Never done   COVID-19 Vaccine (3 - 2023-24 season) 09/24/2021   Medicare Annual Wellness (AWV)  06/18/2022    Pneumonia Vaccine 6+ Years old (1 of 1 - PCV) 02/02/2023 (Originally 05/11/2004)   INFLUENZA VACCINE  08/25/2022   MAMMOGRAM  05/24/2023   DTaP/Tdap/Td (2 - Tdap) 09/20/2023   DEXA SCAN  Completed   HPV VACCINES  Aged Out    There are no preventive care reminders to display for this patient.  Lab Results  Component Value Date   TSH 4.10 01/27/2022   Lab Results  Component Value Date   WBC 7.4 06/30/2021   HGB 13.2 06/30/2021   HCT 38.5 06/30/2021   MCV 93.0 06/30/2021   PLT 368.0 06/30/2021   Lab Results  Component Value Date   NA 141 01/27/2022   K 4.5 01/27/2022   CO2 31 01/27/2022   GLUCOSE 92 01/27/2022   BUN 14 01/27/2022   CREATININE 0.94 01/27/2022   BILITOT 0.7 01/27/2022   ALKPHOS 115 01/27/2022   AST 22 01/27/2022   ALT 18 01/27/2022   PROT 7.2 01/27/2022   ALBUMIN 4.5 01/27/2022   CALCIUM 10.0 01/27/2022   ANIONGAP 11 09/17/2020   GFR 56.43 (L) 01/27/2022   Lab Results  Component Value Date   CHOL 235 (H) 01/27/2022   Lab Results  Component Value Date   HDL 67.10 01/27/2022   Lab Results  Component Value Date   LDLCALC 148 (H) 01/27/2022  Lab Results  Component Value Date   TRIG 103.0 01/27/2022   Lab Results  Component Value Date   CHOLHDL 4 01/27/2022   Lab Results  Component Value Date   HGBA1C 5.8 10/11/2021      Assessment & Plan:  Dysuria Assessment & Plan: POCT urinalysis positive for  leukocytes, nitrite, protein and trace blood. Will treat with cephalexin. Will send urine for culture. Advised patient to increase fluid intake. Advised to take over-the-counter Azo for pain.   Orders: -     Urine Microscopic -     Urine Culture -     POCT urinalysis dipstick  Other orders -     Cephalexin; Take 1 capsule (500 mg total) by mouth 2 (two) times daily for 7 days.  Dispense: 14 capsule; Refill: 0    Follow-up: Return if symptoms worsen or fail to improve.   Kara Dies, NP

## 2022-06-24 ENCOUNTER — Encounter: Payer: Self-pay | Admitting: Nurse Practitioner

## 2022-06-24 NOTE — Assessment & Plan Note (Signed)
POCT urinalysis positive for  leukocytes, nitrite, protein and trace blood. Will treat with cephalexin. Will send urine for culture. Advised patient to increase fluid intake. Advised to take over-the-counter Azo for pain.

## 2022-06-25 LAB — URINE CULTURE
MICRO NUMBER:: 15020334
SPECIMEN QUALITY:: ADEQUATE

## 2022-06-25 NOTE — Progress Notes (Signed)
Please inform the patient: The bacteria that grew in the culture is treatable by the antibiotic she is taking. Complete the course of the antibiotic.

## 2022-06-28 ENCOUNTER — Telehealth: Payer: Self-pay | Admitting: *Deleted

## 2022-06-28 NOTE — Telephone Encounter (Signed)
Patient aware that nothing is needed to change. She is going to finish the abx and try taking with food and take probiotic. Will let us know if she needs anything

## 2022-06-28 NOTE — Telephone Encounter (Signed)
Pt called back and I read the message to her and she wants to know if the provider is going to change anything. Pt has cut down to one pill day instead of two because it was making her sick and her bladder feels better

## 2022-06-28 NOTE — Telephone Encounter (Signed)
Left voicemail to return call. 

## 2022-06-28 NOTE — Telephone Encounter (Signed)
-----   Message from Kara Dies, NP sent at 06/25/2022  2:55 PM EDT ----- Please inform the patient: The bacteria that grew in the culture is treatable by the antibiotic she is taking. Complete the course of the antibiotic.

## 2022-07-05 ENCOUNTER — Ambulatory Visit: Payer: Medicare Other | Admitting: Dermatology

## 2022-07-11 ENCOUNTER — Telehealth: Payer: Self-pay

## 2022-07-11 NOTE — Telephone Encounter (Signed)
I called patient to schedule a physical per Dr. Dale Cloverdale.  Patient has been scheduled to see Dr. Lorin Picket for her physical on 08/26/2022.    Patient states she has recently had a UTI and has completed her medication as prescribed, but she is still experiencing some stinging.  I offered to schedule an appointment for her but she states she would like to wait.  Patient states she has no energy, no appetitie, and she is losing weight.  Patient states she is still moving around and doing things.  Patient states she has had a toothache and may have to have more dental work and she is not sure if this is causing her to feel this way.  Patient has scheduled an appointment to see Dr. Lorin Picket on 07/19/2022 regarding these issues.

## 2022-07-12 NOTE — Telephone Encounter (Signed)
Called patient to follow up. Unable to leave message 

## 2022-07-15 NOTE — Telephone Encounter (Signed)
Received message this am.  Called several days ago, given symptoms, please call and get update on pt.  Symptoms?

## 2022-07-15 NOTE — Telephone Encounter (Signed)
Husband and she is doing better but she has appt in office on 6/25 with you

## 2022-07-19 ENCOUNTER — Encounter: Payer: Self-pay | Admitting: Internal Medicine

## 2022-07-19 ENCOUNTER — Ambulatory Visit (INDEPENDENT_AMBULATORY_CARE_PROVIDER_SITE_OTHER): Payer: Medicare Other | Admitting: Internal Medicine

## 2022-07-19 ENCOUNTER — Ambulatory Visit (INDEPENDENT_AMBULATORY_CARE_PROVIDER_SITE_OTHER): Payer: Medicare Other

## 2022-07-19 VITALS — BP 130/74 | HR 77 | Temp 98.0°F | Resp 16 | Ht 63.0 in | Wt 130.8 lb

## 2022-07-19 DIAGNOSIS — I10 Essential (primary) hypertension: Secondary | ICD-10-CM | POA: Diagnosis not present

## 2022-07-19 DIAGNOSIS — R110 Nausea: Secondary | ICD-10-CM

## 2022-07-19 DIAGNOSIS — K5900 Constipation, unspecified: Secondary | ICD-10-CM

## 2022-07-19 DIAGNOSIS — N1831 Chronic kidney disease, stage 3a: Secondary | ICD-10-CM | POA: Diagnosis not present

## 2022-07-19 DIAGNOSIS — F439 Reaction to severe stress, unspecified: Secondary | ICD-10-CM

## 2022-07-19 DIAGNOSIS — E7800 Pure hypercholesterolemia, unspecified: Secondary | ICD-10-CM | POA: Diagnosis not present

## 2022-07-19 DIAGNOSIS — R739 Hyperglycemia, unspecified: Secondary | ICD-10-CM | POA: Diagnosis not present

## 2022-07-19 DIAGNOSIS — K219 Gastro-esophageal reflux disease without esophagitis: Secondary | ICD-10-CM

## 2022-07-19 DIAGNOSIS — E559 Vitamin D deficiency, unspecified: Secondary | ICD-10-CM

## 2022-07-19 DIAGNOSIS — K2270 Barrett's esophagus without dysplasia: Secondary | ICD-10-CM

## 2022-07-19 DIAGNOSIS — I700 Atherosclerosis of aorta: Secondary | ICD-10-CM

## 2022-07-19 DIAGNOSIS — E041 Nontoxic single thyroid nodule: Secondary | ICD-10-CM

## 2022-07-19 LAB — BASIC METABOLIC PANEL
BUN: 16 mg/dL (ref 6–23)
CO2: 27 mEq/L (ref 19–32)
Calcium: 10 mg/dL (ref 8.4–10.5)
Chloride: 102 mEq/L (ref 96–112)
Creatinine, Ser: 1.05 mg/dL (ref 0.40–1.20)
GFR: 49.25 mL/min — ABNORMAL LOW (ref >60.00)
Glucose, Bld: 90 mg/dL (ref 70–99)
Potassium: 4.5 mEq/L (ref 3.5–5.1)
Sodium: 137 mEq/L (ref 135–145)

## 2022-07-19 LAB — CBC WITH DIFFERENTIAL/PLATELET
Basophils Absolute: 0.1 10*3/uL (ref 0.0–0.1)
Basophils Relative: 1.3 % (ref 0.0–3.0)
Eosinophils Absolute: 0.2 10*3/uL (ref 0.0–0.7)
Eosinophils Relative: 4 % (ref 0.0–5.0)
HCT: 38.8 % (ref 36.0–46.0)
Hemoglobin: 12.9 g/dL (ref 12.0–15.0)
Lymphocytes Relative: 41.4 % (ref 12.0–46.0)
Lymphs Abs: 2.2 10*3/uL (ref 0.7–4.0)
MCHC: 33.3 g/dL (ref 30.0–36.0)
MCV: 92.8 fl (ref 78.0–100.0)
Monocytes Absolute: 0.5 10*3/uL (ref 0.1–1.0)
Monocytes Relative: 8.7 % (ref 3.0–12.0)
Neutro Abs: 2.4 10*3/uL (ref 1.4–7.7)
Neutrophils Relative %: 44.6 % (ref 43.0–77.0)
Platelets: 386 10*3/uL (ref 150.0–400.0)
RBC: 4.18 Mil/uL (ref 3.87–5.11)
RDW: 13.5 % (ref 11.5–15.5)
WBC: 5.4 10*3/uL (ref 4.0–10.5)

## 2022-07-19 LAB — HEPATIC FUNCTION PANEL
ALT: 12 U/L (ref 0–35)
AST: 19 U/L (ref 0–37)
Albumin: 4.4 g/dL (ref 3.5–5.2)
Alkaline Phosphatase: 105 U/L (ref 39–117)
Bilirubin, Direct: 0.1 mg/dL (ref 0.0–0.3)
Total Bilirubin: 0.5 mg/dL (ref 0.2–1.2)
Total Protein: 7.6 g/dL (ref 6.0–8.3)

## 2022-07-19 LAB — VITAMIN D 25 HYDROXY (VIT D DEFICIENCY, FRACTURES): VITD: 27.05 ng/mL — ABNORMAL LOW (ref 30.00–100.00)

## 2022-07-19 LAB — TSH: TSH: 3.12 u[IU]/mL (ref 0.35–5.50)

## 2022-07-19 MED ORDER — ATORVASTATIN CALCIUM 10 MG PO TABS: ORAL_TABLET | ORAL | 1 refills | Status: DC

## 2022-07-19 MED ORDER — GEMTESA 75 MG PO TABS: 1.0000 | ORAL_TABLET | Freq: Every day | ORAL | 2 refills | Status: DC

## 2022-07-19 MED ORDER — SERTRALINE HCL 25 MG PO TABS: 25.0000 mg | ORAL_TABLET | Freq: Every day | ORAL | 1 refills | Status: DC

## 2022-07-19 NOTE — Progress Notes (Signed)
Subjective:    Patient ID: Carrie Noble, female    DOB: 04/03/1940, 82 y.o.   MRN: [id number]  Patient here for  Chief Complaint  Patient presents with   Medical Management of Chronic Issues    HPI Here as a work in appt.  Reported multiple concerns - no energy, no appetite and weight loss.  Also reported toothache. She has long standing GERD s/p Nissen fundoplication which has been maintained on PPI therapy. She also has known Barrett's esophagus without dysplasia noted on EGD with biopsy. Saw GI 06/30/22.  Recommended to continue PPI - omeprazole, anti reflux measures. Recommended  EGD/colonoscopy in 2025.  Recommended benefiber and miralax.  She has not been taking this regularly.  Discussed taking benefiber to help regulate the bowels.  Also discussed increased stress and anxiety. Not taking sertraline regularly.  Does take prn, but seems to be rarely taking.  No vomiting.  Does have nausea.  Not necessarily triggered by food.  Decreased appetite.  Gets full fast.  Intermittent diarrhea and constipation.  No abdominal pain.  Feels full.  Had problems with her tooth/gum.  Treated.  Better now.     Past Medical History:  Diagnosis Date   Allergy    Arthritis    Gastroesophageal reflux    Hiatal hernia 1989   status post Nissen fundoplication    Hx of hysterectomy    Hyperlipidemia    Tachycardia    Patient stated that this has been occuring frequently.   Thyroid disease    Goiter   Past Surgical History:  Procedure Laterality Date   ABDOMINAL HYSTERECTOMY  1980   partial   blepheroplasty b/l eyes Bilateral    BREAST BIOPSY Right    neg   BREAST EXCISIONAL BIOPSY Left 2014   neg   COLONOSCOPY  2015   ESOPHAGOGASTRIC FUNDOPLICATION  1999   LAPAROSCOPIC NISSEN FUNDOPLICATION  1999   TONSILLECTOMY     as well as goiter   UPPER GI ENDOSCOPY  2015   Family History  Problem Relation Age of Onset   Stroke Mother 66   Arthritis Mother    Heart disease Mother    Hypertension  Mother    Stroke Father 73   Hypertension Father    Rheumatic fever Brother        and multiple open heart surgeries   Diabetes Brother        type 2   Stroke Brother    Other Sister        Coronary Atherosclerosis   Stroke Brother    Stroke Sister    Heart disease Sister    Dementia Sister    Cancer Sister        ovarian   Breast cancer Neg Hx    Social History   Socioeconomic History   Marital status: Married    Spouse name: Not on file   Number of children: Not on file   Years of education: Not on file   Highest education level: Not on file  Occupational History   Occupation: Retired    Associate Professor: [employer]  Tobacco Use   Smoking status: Never   Smokeless tobacco: Never  Substance and Sexual Activity   Alcohol use: No    Alcohol/week: 0.0 standard drinks of alcohol   Drug use: No   Sexual activity: Never  Other Topics Concern   Not on file  Social History Narrative   Occasionally drinks coffee.   Social Determinants of Health  Financial Resource Strain: Low Risk  (06/17/2021)   Overall Financial Resource Strain (CARDIA)    Difficulty of Paying Living Expenses: Not hard at all  Food Insecurity: No Food Insecurity (06/17/2021)   Hunger Vital Sign    Worried About Running Out of Food in the Last Year: Never true    Ran Out of Food in the Last Year: Never true  Transportation Needs: No Transportation Needs (06/17/2021)   PRAPARE - Administrator, Civil Service (Medical): No    Lack of Transportation (Non-Medical): No  Physical Activity: Insufficiently Active (06/17/2021)   Exercise Vital Sign    Days of Exercise per Week: 7 days    Minutes of Exercise per Session: 20 min  Stress: No Stress Concern Present (06/17/2021)   [employer] of Occupational Health - Occupational Stress Questionnaire    Feeling of Stress : Not at all  Social Connections: Unknown (06/09/2020)   Social Connection and Isolation Panel [NHANES]    Frequency of Communication with  Friends and Family: Not on file    Frequency of Social Gatherings with Friends and Family: Not on file    Attends Religious Services: Not on file    Active Member of Clubs or Organizations: Not on file    Attends Banker Meetings: Not on file    Marital Status: Married     Review of Systems  Constitutional:  Positive for appetite change. Negative for fever and unexpected weight change.  HENT:  Negative for congestion and sinus pressure.   Respiratory:  Negative for cough, chest tightness and shortness of breath.   Cardiovascular:  Negative for chest pain, palpitations and leg swelling.  Gastrointestinal:  Positive for constipation, diarrhea and nausea. Negative for abdominal pain and vomiting.  Genitourinary:  Negative for difficulty urinating and dysuria.  Musculoskeletal:  Negative for joint swelling and myalgias.  Skin:  Negative for color change and rash.  Neurological:  Negative for dizziness and headaches.  Psychiatric/Behavioral:  Negative for agitation and dysphoric mood.        Objective:     BP 130/74   Pulse 77   Temp 98 F (36.7 C)   Resp 16   Ht 5\' 3"  (1.6 m)   Wt 130 lb 12.8 oz (59.3 kg)   SpO2 98%   BMI 23.17 kg/m  Wt Readings from Last 3 Encounters:  07/19/22 130 lb 12.8 oz (59.3 kg)  06/23/22 131 lb 9.6 oz (59.7 kg)  02/01/22 134 lb 6.4 oz (61 kg)    Physical Exam Vitals reviewed.  Constitutional:      General: She is not in acute distress.    Appearance: Normal appearance.  HENT:     Head: Normocephalic and atraumatic.     Right Ear: External ear normal.     Left Ear: External ear normal.  Eyes:     General: No scleral icterus.       Right eye: No discharge.        Left eye: No discharge.     Conjunctiva/sclera: Conjunctivae normal.  Neck:     Thyroid: No thyromegaly.  Cardiovascular:     Rate and Rhythm: Normal rate and regular rhythm.  Pulmonary:     Effort: No respiratory distress.     Breath sounds: Normal breath sounds.  No wheezing.  Abdominal:     General: Bowel sounds are normal.     Palpations: Abdomen is soft.     Tenderness: There is no abdominal tenderness.  Musculoskeletal:  General: No swelling or tenderness.     Cervical back: Neck supple. No tenderness.  Lymphadenopathy:     Cervical: No cervical adenopathy.  Skin:    Findings: No erythema or rash.  Neurological:     Mental Status: She is alert.  Psychiatric:        Mood and Affect: Mood normal.        Behavior: Behavior normal.      Outpatient Encounter Medications as of 07/19/2022  Medication Sig   atorvastatin (LIPITOR) 10 MG tablet One tablet q Monday, Wednesday and Friday.   Azelastine HCl 137 MCG/SPRAY SOLN PLACE 1 SPRAY INTO BOTH NOSTRILS 2 (TWO) TIMES DAILY. USE IN EACH NOSTRIL AS DIRECTED   Biotin 1000 MCG tablet Take 1,000 mcg by mouth 3 (three) times daily. Reported on 05/13/2015   Calcium Carbonate (CALCIUM 600 PO) Take by mouth daily. Reported on 05/13/2015   Cholecalciferol (VITAMIN D PO) Take by mouth daily. Reported on 05/13/2015   estradiol (ESTRACE VAGINAL) 0.1 MG/GM vaginal cream Apply one applicator q hs x 5 nights and then one applicator 2x week.   levalbuterol (XOPENEX HFA) 45 MCG/ACT inhaler Inhale 2 puffs into the lungs every 6 (six) hours as needed for wheezing.   loratadine (CLARITIN) 5 MG chewable tablet Chew 1 tablet (5 mg total) by mouth daily.   Magnesium Oxide 250 MG TABS Take 1 tablet by mouth daily.   omeprazole (PRILOSEC) 20 MG capsule TAKE 1 CAPSULE (20 MG TOTAL) BY MOUTH 2 (TWO) TIMES DAILY BEFORE A MEAL.   polyethylene glycol (MIRALAX) 17 g packet Take 17 g by mouth daily.   sertraline (ZOLOFT) 25 MG tablet Take 1 tablet (25 mg total) by mouth daily.   Varenicline Tartrate (TYRVAYA) 0.03 MG/ACT SOLN Place into the nose.   Vibegron (GEMTESA) 75 MG TABS Take 1 tablet (75 mg total) by mouth daily.   vitamin B-12 (CYANOCOBALAMIN) 1000 MCG tablet Take 1,000 mcg by mouth daily. Reported on 05/13/2015    [DISCONTINUED] atorvastatin (LIPITOR) 10 MG tablet One tablet q Monday, Wednesday and Friday.   [DISCONTINUED] benzonatate (TESSALON PERLES) 100 MG capsule Take 1 capsule (100 mg total) by mouth 3 (three) times daily as needed for cough.   [DISCONTINUED] GEMTESA 75 MG TABS TAKE 1 TABLET BY MOUTH DAILY   [DISCONTINUED] sertraline (ZOLOFT) 25 MG tablet TAKE 1 TABLET (25 MG TOTAL) BY MOUTH DAILY.   Facility-Administered Encounter Medications as of 07/19/2022  Medication   betamethasone acetate-betamethasone sodium phosphate (CELESTONE) injection 3 mg   betamethasone acetate-betamethasone sodium phosphate (CELESTONE) injection 3 mg     Lab Results  Component Value Date   WBC 5.4 07/19/2022   HGB 12.9 07/19/2022   HCT 38.8 07/19/2022   PLT 386.0 07/19/2022   GLUCOSE 90 07/19/2022   CHOL 235 (H) 01/27/2022   TRIG 103.0 01/27/2022   HDL 67.10 01/27/2022   LDLDIRECT 141.0 09/01/2016   LDLCALC 148 (H) 01/27/2022   ALT 12 07/19/2022   AST 19 07/19/2022   NA 137 07/19/2022   K 4.5 07/19/2022   CL 102 07/19/2022   CREATININE 1.05 07/19/2022   BUN 16 07/19/2022   CO2 27 07/19/2022   TSH 3.12 07/19/2022   HGBA1C 5.8 10/11/2021    MM 3D DIAGNOSTIC MAMMOGRAM BILATERAL BREAST  Result Date: 05/24/2022 CLINICAL DATA:  Screening recall for a possible distortion in the right breast and a left breast asymmetry. EXAM: DIGITAL DIAGNOSTIC BILATERAL MAMMOGRAM WITH TOMOSYNTHESIS; ULTRASOUND RIGHT BREAST LIMITED TECHNIQUE: Bilateral digital diagnostic mammography and breast  tomosynthesis was performed.; Targeted ultrasound examination of the right breast was performed COMPARISON:  Previous exam(s). ACR Breast Density Category c: The breasts are heterogeneously dense, which may obscure small masses. FINDINGS: The possible distortion in the upper-outer right breast appears to resolve on spot compression tomosynthesis imaging. Full paddle rolled medial and lateral CC views were performed, also demonstrating no  persistent suspicious distortion. The asymmetry in the upper-outer left breast resolves on spot compression tomosynthesis imaging. Ultrasound targeted to the lower outer and upper outer quadrant of the left breast demonstrates normal fibroglandular tissue. No suspicious masses or areas of shadowing are identified. IMPRESSION: Resolution of the bilateral breast abnormalities consistent with overlapping fibroglandular tissue. RECOMMENDATION: Screening mammogram in one year.(Code:SM-B-01Y) I have discussed the findings and recommendations with the patient. If applicable, a reminder letter will be sent to the patient regarding the next appointment. BI-RADS CATEGORY  1: Negative. Electronically Signed   By: Frederico Hamman M.D.   On: 05/24/2022 11:38   [place] LIMITED ULTRASOUND INCLUDING AXILLA RIGHT BREAST  Result Date: 05/24/2022 CLINICAL DATA:  Screening recall for a possible distortion in the right breast and a left breast asymmetry. EXAM: DIGITAL DIAGNOSTIC BILATERAL MAMMOGRAM WITH TOMOSYNTHESIS; ULTRASOUND RIGHT BREAST LIMITED TECHNIQUE: Bilateral digital diagnostic mammography and breast tomosynthesis was performed.; Targeted ultrasound examination of the right breast was performed COMPARISON:  Previous exam(s). ACR Breast Density Category c: The breasts are heterogeneously dense, which may obscure small masses. FINDINGS: The possible distortion in the upper-outer right breast appears to resolve on spot compression tomosynthesis imaging. Full paddle rolled medial and lateral CC views were performed, also demonstrating no persistent suspicious distortion. The asymmetry in the upper-outer left breast resolves on spot compression tomosynthesis imaging. Ultrasound targeted to the lower outer and upper outer quadrant of the left breast demonstrates normal fibroglandular tissue. No suspicious masses or areas of shadowing are identified. IMPRESSION: Resolution of the bilateral breast abnormalities consistent with  overlapping fibroglandular tissue. RECOMMENDATION: Screening mammogram in one year.(Code:SM-B-01Y) I have discussed the findings and recommendations with the patient. If applicable, a reminder letter will be sent to the patient regarding the next appointment. BI-RADS CATEGORY  1: Negative. Electronically Signed   By: Frederico Hamman M.D.   On: 05/24/2022 11:38      Assessment & Plan:  Benign essential HTN Assessment & Plan: Blood pressure as outlined.  Follow pressures. On no medication currently.  Follow.   Orders: -     Basic metabolic panel  Stage 3a chronic kidney disease (HCC) Assessment & Plan: Continue to avoid antiinflammatories.  Stay hydrated.  Follow.  Follow metabolic panel.    Hypercholesterolemia Assessment & Plan: Continue lipitor.  Low cholesterol diet and exercise.  Follow lipid panel and liver function tests.   Orders: -     CBC with Differential/Platelet -     Hepatic function panel -     TSH  Hyperglycemia Assessment & Plan: Low carb diet and exercise.  Follow met b and a1c.    Vitamin D deficiency -     VITAMIN D 25 Hydroxy (Vit-D Deficiency, Fractures)  Nausea Assessment & Plan: Check xray to confirm if increased stool.  Question if contributing to nausea.  Continues on PPI.  Also discussed increased stress.  Take zoloft regularly.    Orders: -     DG Abd 1 View; Future  Constipation, unspecified constipation type Assessment & Plan: Has a history of constipation.  Saw GI recently.  Recommended benefiber and miralax.  She has not been taking  this regularly.  Discussed taking benefiber to help regulate the bowels. Also check xray.  Confirm if increased stool.   Orders: -     DG Abd 1 View; Future  Aortic atherosclerosis (HCC) Assessment & Plan: Continue lipitor.    Barrett's esophagus without dysplasia Assessment & Plan: She has long standing GERD s/p Nissen fundoplication which has been maintained on PPI therapy. She also has known  Barrett's esophagus without dysplasia noted on EGD with biopsy. Saw GI 06/30/22.  Recommended to continue PPI - omeprazole, anti reflux measures. Recommended  EGD/colonoscopy in 2025.     Gastroesophageal reflux disease, unspecified whether esophagitis present Assessment & Plan: Continue prilosec.    Stress Assessment & Plan: Increased stress.  Discussed.  Discussed taking zoloft regularly.    Thyroid nodule Assessment & Plan: Saw Dr Gershon Crane 06/10/21 - recommended f/u ultrasound in 2 years.     Other orders -     Atorvastatin Calcium; One tablet q Monday, Wednesday and Friday.  Dispense: 39 tablet; Refill: 1 -     Gemtesa; Take 1 tablet (75 mg total) by mouth daily.  Dispense: 90 tablet; Refill: 2 -     Sertraline HCl; Take 1 tablet (25 mg total) by mouth daily.  Dispense: 90 tablet; Refill: 1     Dale [place], MD

## 2022-07-24 ENCOUNTER — Encounter: Payer: Self-pay | Admitting: Internal Medicine

## 2022-07-24 NOTE — Assessment & Plan Note (Signed)
Saw Dr O'Connell 06/10/21 - recommended f/u ultrasound in 2 years.   

## 2022-07-24 NOTE — Assessment & Plan Note (Signed)
Has a history of constipation.  Saw GI recently.  Recommended benefiber and miralax.  She has not been taking this regularly.  Discussed taking benefiber to help regulate the bowels. Also check xray.  Confirm if increased stool.

## 2022-07-24 NOTE — Assessment & Plan Note (Signed)
Continue to avoid antiinflammatories.  Stay hydrated.  Follow.  Follow metabolic panel.  °

## 2022-07-24 NOTE — Assessment & Plan Note (Addendum)
Blood pressure as outlined.  Follow pressures. On no medication currently.  Follow.

## 2022-07-24 NOTE — Assessment & Plan Note (Signed)
She has long standing GERD s/p Nissen fundoplication which has been maintained on PPI therapy. She also has known Barrett's esophagus without dysplasia noted on EGD with biopsy. Saw GI 06/30/22.  Recommended to continue PPI - omeprazole, anti reflux measures. Recommended  EGD/colonoscopy in 2025.

## 2022-07-24 NOTE — Assessment & Plan Note (Signed)
Continue lipitor.  Low cholesterol diet and exercise.  Follow lipid panel and liver function tests.   

## 2022-07-24 NOTE — Assessment & Plan Note (Signed)
Increased stress.  Discussed.  Discussed taking zoloft regularly.

## 2022-07-24 NOTE — Assessment & Plan Note (Signed)
Low carb diet and exercise.  Follow met b and a1c.   

## 2022-07-24 NOTE — Assessment & Plan Note (Signed)
Continue prilosec

## 2022-07-24 NOTE — Assessment & Plan Note (Signed)
Check xray to confirm if increased stool.  Question if contributing to nausea.  Continues on PPI.  Also discussed increased stress.  Take zoloft regularly.

## 2022-07-24 NOTE — Assessment & Plan Note (Signed)
Continue lipitor  ?

## 2022-07-26 ENCOUNTER — Telehealth: Payer: Self-pay

## 2022-07-26 NOTE — Telephone Encounter (Signed)
Noted  

## 2022-07-26 NOTE — Telephone Encounter (Signed)
Pt returned Bethann Berkshire CMA call. Note below was read to her. Pt aware and understood. Pt stated she will see GI in a few weeks.

## 2022-07-26 NOTE — Telephone Encounter (Signed)
-----   Message from Dale Atmore, MD sent at 07/25/2022  8:45 PM EDT ----- Can try benefiber daily instead of miralax.  Also, given persistent GI issues, I would like to refer her back to GI for further evaluation.

## 2022-07-26 NOTE — Telephone Encounter (Signed)
Patient states she realized that she is still experiencing a burning sensation and would like to see if Dr. Dale Ghent can call something in for her.  Patient states she is willing to leave a specimen if needed.  Patient states she is drinking a lot of water but it hasn't cleared it up.

## 2022-07-27 NOTE — Telephone Encounter (Signed)
Called patient to let her know that we will need to do an appointment in order to determine best treatment. Offered patient appt to come drop urine off Friday and do appt with Dr Lorin Picket in the PM. Patient declined and said she is going to try something OTC and then if not better will come in for appt or will go to acute care if office is closed.

## 2022-08-03 ENCOUNTER — Telehealth: Payer: Self-pay | Admitting: Internal Medicine

## 2022-08-03 NOTE — Telephone Encounter (Signed)
Prescription Request  08/03/2022  LOV: 07/19/2022  What is the name of the medication or equipment? estradiol   Have you contacted your pharmacy to request a refill? No   Which pharmacy would you like this sent to?  [facility]  Patient notified that their request is being sent to the clinical staff for review and that they should receive a response within 2 business days.   Please advise at Mobile [phone] (mobile)

## 2022-08-04 ENCOUNTER — Other Ambulatory Visit: Payer: Self-pay

## 2022-08-04 MED ORDER — ESTRADIOL 0.1 MG/GM VA CREA: TOPICAL_CREAM | VAGINAL | 2 refills | Status: DC

## 2022-08-04 NOTE — Telephone Encounter (Signed)
Medication refilled

## 2022-08-25 ENCOUNTER — Telehealth: Payer: Self-pay | Admitting: Gastroenterology

## 2022-08-25 NOTE — Telephone Encounter (Signed)
Patient walked in today 08/25/22 at 5:19 pm est to confirm her doctor appt for Mon 08/29/22 with Dr. Tobi Bastos at 3:00 pm

## 2022-08-26 ENCOUNTER — Ambulatory Visit (INDEPENDENT_AMBULATORY_CARE_PROVIDER_SITE_OTHER): Payer: Medicare Other | Admitting: Internal Medicine

## 2022-08-26 ENCOUNTER — Telehealth: Payer: Self-pay | Admitting: Internal Medicine

## 2022-08-26 ENCOUNTER — Encounter: Payer: Self-pay | Admitting: Internal Medicine

## 2022-08-26 VITALS — BP 124/76 | HR 60 | Temp 97.5°F | Ht 63.0 in | Wt 126.6 lb

## 2022-08-26 DIAGNOSIS — I10 Essential (primary) hypertension: Secondary | ICD-10-CM

## 2022-08-26 DIAGNOSIS — R634 Abnormal weight loss: Secondary | ICD-10-CM | POA: Diagnosis not present

## 2022-08-26 DIAGNOSIS — R110 Nausea: Secondary | ICD-10-CM

## 2022-08-26 DIAGNOSIS — K219 Gastro-esophageal reflux disease without esophagitis: Secondary | ICD-10-CM

## 2022-08-26 DIAGNOSIS — R739 Hyperglycemia, unspecified: Secondary | ICD-10-CM | POA: Diagnosis not present

## 2022-08-26 DIAGNOSIS — E7800 Pure hypercholesterolemia, unspecified: Secondary | ICD-10-CM | POA: Diagnosis not present

## 2022-08-26 DIAGNOSIS — Z0000 Encounter for general adult medical examination without abnormal findings: Secondary | ICD-10-CM

## 2022-08-26 DIAGNOSIS — R109 Unspecified abdominal pain: Secondary | ICD-10-CM | POA: Insufficient documentation

## 2022-08-26 DIAGNOSIS — K2270 Barrett's esophagus without dysplasia: Secondary | ICD-10-CM

## 2022-08-26 DIAGNOSIS — N1831 Chronic kidney disease, stage 3a: Secondary | ICD-10-CM

## 2022-08-26 DIAGNOSIS — I700 Atherosclerosis of aorta: Secondary | ICD-10-CM

## 2022-08-26 DIAGNOSIS — R1033 Periumbilical pain: Secondary | ICD-10-CM | POA: Diagnosis not present

## 2022-08-26 DIAGNOSIS — F439 Reaction to severe stress, unspecified: Secondary | ICD-10-CM

## 2022-08-26 DIAGNOSIS — E041 Nontoxic single thyroid nodule: Secondary | ICD-10-CM

## 2022-08-26 LAB — CBC WITH DIFFERENTIAL/PLATELET
Basophils Absolute: 0 10*3/uL (ref 0.0–0.1)
Basophils Relative: 0.7 % (ref 0.0–3.0)
Eosinophils Absolute: 0.2 10*3/uL (ref 0.0–0.7)
Eosinophils Relative: 3.8 % (ref 0.0–5.0)
HCT: 39.8 % (ref 36.0–46.0)
Hemoglobin: 13.2 g/dL (ref 12.0–15.0)
Lymphocytes Relative: 40.3 % (ref 12.0–46.0)
Lymphs Abs: 2.2 10*3/uL (ref 0.7–4.0)
MCHC: 33.2 g/dL (ref 30.0–36.0)
MCV: 93.1 fl (ref 78.0–100.0)
Monocytes Absolute: 0.5 10*3/uL (ref 0.1–1.0)
Monocytes Relative: 8.8 % (ref 3.0–12.0)
Neutro Abs: 2.5 10*3/uL (ref 1.4–7.7)
Neutrophils Relative %: 46.4 % (ref 43.0–77.0)
Platelets: 332 10*3/uL (ref 150.0–400.0)
RBC: 4.28 Mil/uL (ref 3.87–5.11)
RDW: 14.3 % (ref 11.5–15.5)
WBC: 5.4 10*3/uL (ref 4.0–10.5)

## 2022-08-26 LAB — HEPATIC FUNCTION PANEL
ALT: 14 U/L (ref 0–35)
AST: 21 U/L (ref 0–37)
Albumin: 4.5 g/dL (ref 3.5–5.2)
Alkaline Phosphatase: 101 U/L (ref 39–117)
Bilirubin, Direct: 0.1 mg/dL (ref 0.0–0.3)
Total Bilirubin: 0.6 mg/dL (ref 0.2–1.2)
Total Protein: 7.4 g/dL (ref 6.0–8.3)

## 2022-08-26 LAB — BASIC METABOLIC PANEL
BUN: 16 mg/dL (ref 6–23)
CO2: 28 mEq/L (ref 19–32)
Calcium: 9.7 mg/dL (ref 8.4–10.5)
Chloride: 104 mEq/L (ref 96–112)
Creatinine, Ser: 0.98 mg/dL (ref 0.40–1.20)
GFR: 53.46 mL/min — ABNORMAL LOW (ref >60.00)
Glucose, Bld: 96 mg/dL (ref 70–99)
Potassium: 4.7 mEq/L (ref 3.5–5.1)
Sodium: 139 mEq/L (ref 135–145)

## 2022-08-26 NOTE — Assessment & Plan Note (Signed)
Noticed some minimal discomfort on exam - to palpation.  With persistent nausea, weight loss and abdominal discomfort, will check CT scan.  Keep appt with GI next week for question of need for f/u EGD and further w/up.  Continue PPI

## 2022-08-26 NOTE — Telephone Encounter (Signed)
Pt called stating she want hold off on the CT scan for right now

## 2022-08-26 NOTE — Assessment & Plan Note (Signed)
Physical today 08/26/22.  Mammogram 05/20/22 - Birads 0.  F/u bilateral diagnositc mammogram -Birads I.  Recommended continue annual screening mammogram.   Colonoscopy 2015.  Recommended f/u in 10 years.

## 2022-08-26 NOTE — Telephone Encounter (Signed)
Lft pt vm to call ofc to sch CT. thanks 

## 2022-08-26 NOTE — Assessment & Plan Note (Signed)
Blood pressure as outlined.  Follow pressures. On no medication currently.  Follow.

## 2022-08-26 NOTE — Assessment & Plan Note (Signed)
Follow met b and A1c.  

## 2022-08-26 NOTE — Telephone Encounter (Signed)
Noted. Let me know if changes her mind.  Please notify Carrie Noble that pt wants to hold.  It has already been ordered.

## 2022-08-26 NOTE — Assessment & Plan Note (Signed)
Continue lipitor  ?

## 2022-08-26 NOTE — Assessment & Plan Note (Signed)
Increased stress.  Discussed.  Discussed taking zoloft regularly.

## 2022-08-26 NOTE — Assessment & Plan Note (Signed)
She has long standing GERD s/p Nissen fundoplication which has been maintained on PPI therapy. She also has known Barrett's esophagus without dysplasia noted on EGD with biopsy. Saw GI 06/30/22.  Recommended to continue PPI - omeprazole, anti reflux measures.  Has f/u next week.

## 2022-08-26 NOTE — Progress Notes (Signed)
Subjective:    Patient ID: Carrie Noble, female    DOB: 1940/03/29, 82 y.o.   MRN: [id number]  Patient here for  Chief Complaint  Patient presents with   Annual Exam    HPI Here for a physical exam. She has long standing GERD s/p Nissen fundoplication which has been maintained on PPI therapy. She also has known Barrett's esophagus without dysplasia noted on EGD with biopsy. Saw GI 06/30/22.  Recommended to continue PPI - omeprazole, anti reflux measures. Recommended  EGD/colonoscopy in 2025.  Recommended benefiber and miralax. Has f/u with GI next week. Reports persistent nausea.  No vomiting.  No diarrhea now.  Decreased appetite.  Weight continues to decrease.  No vomiting.  Taking PPI.  No chest pain or sob reported.  No increased cough.     Past Medical History:  Diagnosis Date   Allergy    Arthritis    Gastroesophageal reflux    Hiatal hernia 1989   status post Nissen fundoplication    Hx of hysterectomy    Hyperlipidemia    Tachycardia    Patient stated that this has been occuring frequently.   Thyroid disease    Goiter   Past Surgical History:  Procedure Laterality Date   ABDOMINAL HYSTERECTOMY  1980   partial   blepheroplasty b/l eyes Bilateral    BREAST BIOPSY Right    neg   BREAST EXCISIONAL BIOPSY Left 2014   neg   COLONOSCOPY  2015   ESOPHAGOGASTRIC FUNDOPLICATION  1999   LAPAROSCOPIC NISSEN FUNDOPLICATION  1999   TONSILLECTOMY     as well as goiter   UPPER GI ENDOSCOPY  2015   Family History  Problem Relation Age of Onset   Stroke Mother 61   Arthritis Mother    Heart disease Mother    Hypertension Mother    Stroke Father 67   Hypertension Father    Rheumatic fever Brother        and multiple open heart surgeries   Diabetes Brother        type 2   Stroke Brother    Other Sister        Coronary Atherosclerosis   Stroke Brother    Stroke Sister    Heart disease Sister    Dementia Sister    Cancer Sister        ovarian   Breast cancer Neg Hx     Social History   Socioeconomic History   Marital status: Married    Spouse name: Not on file   Number of children: Not on file   Years of education: Not on file   Highest education level: Not on file  Occupational History   Occupation: Retired    Associate Professor: [employer]  Tobacco Use   Smoking status: Never   Smokeless tobacco: Never  Substance and Sexual Activity   Alcohol use: No    Alcohol/week: 0.0 standard drinks of alcohol   Drug use: No   Sexual activity: Never  Other Topics Concern   Not on file  Social History Narrative   Occasionally drinks coffee.   Social Determinants of Health   Financial Resource Strain: Low Risk  (06/17/2021)   Overall Financial Resource Strain (CARDIA)    Difficulty of Paying Living Expenses: Not hard at all  Food Insecurity: No Food Insecurity (06/17/2021)   Hunger Vital Sign    Worried About Running Out of Food in the Last Year: Never true    Ran Out of Food  in the Last Year: Never true  Transportation Needs: No Transportation Needs (06/17/2021)   PRAPARE - Administrator, Civil Service (Medical): No    Lack of Transportation (Non-Medical): No  Physical Activity: Insufficiently Active (06/17/2021)   Exercise Vital Sign    Days of Exercise per Week: 7 days    Minutes of Exercise per Session: 20 min  Stress: No Stress Concern Present (06/17/2021)   [employer] of Occupational Health - Occupational Stress Questionnaire    Feeling of Stress : Not at all  Social Connections: Unknown (06/09/2020)   Social Connection and Isolation Panel [NHANES]    Frequency of Communication with Friends and Family: Not on file    Frequency of Social Gatherings with Friends and Family: Not on file    Attends Religious Services: Not on file    Active Member of Clubs or Organizations: Not on file    Attends Banker Meetings: Not on file    Marital Status: Married     Review of Systems  Constitutional:        Decreased appetite.   Weight - decrease.   HENT:  Negative for congestion, sinus pressure and sore throat.   Eyes:  Negative for pain and visual disturbance.  Respiratory:  Negative for cough, chest tightness and shortness of breath.   Cardiovascular:  Negative for chest pain, palpitations and leg swelling.  Gastrointestinal:  Negative for abdominal pain, diarrhea, nausea and vomiting.  Genitourinary:  Negative for difficulty urinating and dysuria.  Musculoskeletal:  Negative for joint swelling and myalgias.  Skin:  Negative for color change and rash.  Neurological:  Negative for dizziness and headaches.  Hematological:  Negative for adenopathy. Does not bruise/bleed easily.  Psychiatric/Behavioral:  Negative for agitation and dysphoric mood.        Objective:     BP 124/76   Pulse 60   Temp (!) 97.5 F (36.4 C) (Oral)   Ht 5\' 3"  (1.6 m)   Wt 126 lb 9.6 oz (57.4 kg)   SpO2 98%   BMI 22.43 kg/m  Wt Readings from Last 3 Encounters:  08/26/22 126 lb 9.6 oz (57.4 kg)  07/19/22 130 lb 12.8 oz (59.3 kg)  06/23/22 131 lb 9.6 oz (59.7 kg)    Physical Exam Vitals reviewed.  Constitutional:      General: She is not in acute distress.    Appearance: Normal appearance. She is well-developed.  HENT:     Head: Normocephalic and atraumatic.     Right Ear: External ear normal.     Left Ear: External ear normal.  Eyes:     General: No scleral icterus.       Right eye: No discharge.        Left eye: No discharge.     Conjunctiva/sclera: Conjunctivae normal.  Neck:     Thyroid: No thyromegaly.     Comments: Right thyroid - fullness.  Cardiovascular:     Rate and Rhythm: Normal rate and regular rhythm.  Pulmonary:     Effort: No tachypnea, accessory muscle usage or respiratory distress.     Breath sounds: Normal breath sounds. No decreased breath sounds or wheezing.  Chest:  Breasts:    Right: No inverted nipple, mass, nipple discharge or tenderness (no axillary adenopathy).     Left: No inverted  nipple, mass, nipple discharge or tenderness (no axilarry adenopathy).  Abdominal:     General: Bowel sounds are normal.     Palpations: Abdomen is  soft.     Tenderness: There is no abdominal tenderness.  Musculoskeletal:        General: No swelling or tenderness.     Cervical back: Neck supple.  Lymphadenopathy:     Cervical: No cervical adenopathy.  Skin:    Findings: No erythema or rash.  Neurological:     Mental Status: She is alert and oriented to person, place, and time.  Psychiatric:        Mood and Affect: Mood normal.        Behavior: Behavior normal.      Outpatient Encounter Medications as of 08/26/2022  Medication Sig   atorvastatin (LIPITOR) 10 MG tablet One tablet q Monday, Wednesday and Friday.   Biotin 1000 MCG tablet Take 1,000 mcg by mouth 3 (three) times daily. Reported on 05/13/2015   Cholecalciferol (VITAMIN D PO) Take by mouth daily. Reported on 05/13/2015   estradiol (ESTRACE VAGINAL) 0.1 MG/GM vaginal cream Apply one applicator q hs x 5 nights and then one applicator 2x week.   Magnesium Oxide 250 MG TABS Take 1 tablet by mouth daily.   omeprazole (PRILOSEC) 20 MG capsule TAKE 1 CAPSULE (20 MG TOTAL) BY MOUTH 2 (TWO) TIMES DAILY BEFORE A MEAL.   polyethylene glycol (MIRALAX) 17 g packet Take 17 g by mouth daily.   sertraline (ZOLOFT) 25 MG tablet Take 1 tablet (25 mg total) by mouth daily.   Vibegron (GEMTESA) 75 MG TABS Take 1 tablet (75 mg total) by mouth daily.   vitamin B-12 (CYANOCOBALAMIN) 1000 MCG tablet Take 1,000 mcg by mouth daily. Reported on 05/13/2015   Calcium Carbonate (CALCIUM 600 PO) Take by mouth daily. Reported on 05/13/2015 (Patient not taking: Reported on 08/26/2022)   levalbuterol (XOPENEX HFA) 45 MCG/ACT inhaler Inhale 2 puffs into the lungs every 6 (six) hours as needed for wheezing. (Patient not taking: Reported on 08/26/2022)   Varenicline Tartrate (TYRVAYA) 0.03 MG/ACT SOLN Place into the nose. (Patient not taking: Reported on 08/26/2022)    [DISCONTINUED] Azelastine HCl 137 MCG/SPRAY SOLN PLACE 1 SPRAY INTO BOTH NOSTRILS 2 (TWO) TIMES DAILY. USE IN EACH NOSTRIL AS DIRECTED (Patient not taking: Reported on 08/26/2022)   [DISCONTINUED] loratadine (CLARITIN) 5 MG chewable tablet Chew 1 tablet (5 mg total) by mouth daily. (Patient not taking: Reported on 08/26/2022)   Facility-Administered Encounter Medications as of 08/26/2022  Medication   betamethasone acetate-betamethasone sodium phosphate (CELESTONE) injection 3 mg   betamethasone acetate-betamethasone sodium phosphate (CELESTONE) injection 3 mg     Lab Results  Component Value Date   WBC 5.4 07/19/2022   HGB 12.9 07/19/2022   HCT 38.8 07/19/2022   PLT 386.0 07/19/2022   GLUCOSE 90 07/19/2022   CHOL 235 (H) 01/27/2022   TRIG 103.0 01/27/2022   HDL 67.10 01/27/2022   LDLDIRECT 141.0 09/01/2016   LDLCALC 148 (H) 01/27/2022   ALT 12 07/19/2022   AST 19 07/19/2022   NA 137 07/19/2022   K 4.5 07/19/2022   CL 102 07/19/2022   CREATININE 1.05 07/19/2022   BUN 16 07/19/2022   CO2 27 07/19/2022   TSH 3.12 07/19/2022   HGBA1C 5.8 10/11/2021    MM 3D DIAGNOSTIC MAMMOGRAM BILATERAL BREAST  Result Date: 05/24/2022 CLINICAL DATA:  Screening recall for a possible distortion in the right breast and a left breast asymmetry. EXAM: DIGITAL DIAGNOSTIC BILATERAL MAMMOGRAM WITH TOMOSYNTHESIS; ULTRASOUND RIGHT BREAST LIMITED TECHNIQUE: Bilateral digital diagnostic mammography and breast tomosynthesis was performed.; Targeted ultrasound examination of the right breast was performed COMPARISON:  Previous exam(s). ACR Breast Density Category c: The breasts are heterogeneously dense, which may obscure small masses. FINDINGS: The possible distortion in the upper-outer right breast appears to resolve on spot compression tomosynthesis imaging. Full paddle rolled medial and lateral CC views were performed, also demonstrating no persistent suspicious distortion. The asymmetry in the upper-outer left  breast resolves on spot compression tomosynthesis imaging. Ultrasound targeted to the lower outer and upper outer quadrant of the left breast demonstrates normal fibroglandular tissue. No suspicious masses or areas of shadowing are identified. IMPRESSION: Resolution of the bilateral breast abnormalities consistent with overlapping fibroglandular tissue. RECOMMENDATION: Screening mammogram in one year.(Code:SM-B-01Y) I have discussed the findings and recommendations with the patient. If applicable, a reminder letter will be sent to the patient regarding the next appointment. BI-RADS CATEGORY  1: Negative. Electronically Signed   By: Frederico Hamman M.D.   On: 05/24/2022 11:38   [place] LIMITED ULTRASOUND INCLUDING AXILLA RIGHT BREAST  Result Date: 05/24/2022 CLINICAL DATA:  Screening recall for a possible distortion in the right breast and a left breast asymmetry. EXAM: DIGITAL DIAGNOSTIC BILATERAL MAMMOGRAM WITH TOMOSYNTHESIS; ULTRASOUND RIGHT BREAST LIMITED TECHNIQUE: Bilateral digital diagnostic mammography and breast tomosynthesis was performed.; Targeted ultrasound examination of the right breast was performed COMPARISON:  Previous exam(s). ACR Breast Density Category c: The breasts are heterogeneously dense, which may obscure small masses. FINDINGS: The possible distortion in the upper-outer right breast appears to resolve on spot compression tomosynthesis imaging. Full paddle rolled medial and lateral CC views were performed, also demonstrating no persistent suspicious distortion. The asymmetry in the upper-outer left breast resolves on spot compression tomosynthesis imaging. Ultrasound targeted to the lower outer and upper outer quadrant of the left breast demonstrates normal fibroglandular tissue. No suspicious masses or areas of shadowing are identified. IMPRESSION: Resolution of the bilateral breast abnormalities consistent with overlapping fibroglandular tissue. RECOMMENDATION: Screening mammogram in one  year.(Code:SM-B-01Y) I have discussed the findings and recommendations with the patient. If applicable, a reminder letter will be sent to the patient regarding the next appointment. BI-RADS CATEGORY  1: Negative. Electronically Signed   By: Frederico Hamman M.D.   On: 05/24/2022 11:38      Assessment & Plan:  Routine general medical examination at a health care facility  Hypercholesterolemia Assessment & Plan: Continue lipitor.  Low cholesterol diet and exercise.  Follow lipid panel and liver function tests.   Orders: -     Lipid panel; Future -     Hepatic function panel; Future -     Basic metabolic panel; Future  Hyperglycemia Assessment & Plan: Follow met b and A1c.   Orders: -     Hemoglobin A1c; Future  Health care maintenance Assessment & Plan: Physical today 08/26/22.  Mammogram 05/20/22 - Birads 0.  F/u bilateral diagnositc mammogram -Birads I.  Recommended continue annual screening mammogram.   Colonoscopy 2015.  Recommended f/u in 10 years.     Weight loss Assessment & Plan: Noticed some minimal discomfort on exam - to palpation.  With persistent nausea, weight loss and abdominal discomfort, will check CT scan.  Keep appt with GI next week for question of need for f/u EGD and further w/up.  Continue PPI   Orders: -     CT ABDOMEN PELVIS WO CONTRAST; Future  Nausea Assessment & Plan: Noticed some minimal discomfort on exam - to palpation.  With persistent nausea, weight loss and abdominal discomfort, will check CT scan.  Keep appt with GI next week for question of need  for f/u EGD and further w/up.  Continue PPI   Orders: -     CT ABDOMEN PELVIS WO CONTRAST; Future  Periumbilical abdominal pain Assessment & Plan: Noticed some minimal discomfort on exam - to palpation.  With persistent nausea, weight loss and abdominal discomfort, will check CT scan.  Keep appt with GI next week for question of need for f/u EGD and further w/up.  Continue PPI   Orders: -     CBC  with Differential/Platelet -     Basic metabolic panel -     Hepatic function panel -     CT ABDOMEN PELVIS WO CONTRAST; Future  Aortic atherosclerosis (HCC) Assessment & Plan: Continue lipitor.    Barrett's esophagus without dysplasia Assessment & Plan: She has long standing GERD s/p Nissen fundoplication which has been maintained on PPI therapy. She also has known Barrett's esophagus without dysplasia noted on EGD with biopsy. Saw GI 06/30/22.  Recommended to continue PPI - omeprazole, anti reflux measures.  Has f/u next week.     Benign essential HTN Assessment & Plan: Blood pressure as outlined.  Follow pressures. On no medication currently.  Follow.    Stage 3a chronic kidney disease (HCC) Assessment & Plan: Continue to avoid antiinflammatories.  Stay hydrated.  Follow.  Follow metabolic panel.    Gastroesophageal reflux disease, unspecified whether esophagitis present Assessment & Plan: Continue prilosec.    Stress Assessment & Plan: Increased stress.  Discussed.  Discussed taking zoloft regularly.    Thyroid nodule Assessment & Plan: Saw Dr Gershon Crane 06/10/21 - recommended f/u ultrasound in 2 years.        [place], MD

## 2022-08-26 NOTE — Telephone Encounter (Signed)
Carrie Noble, patient does not wish to schedule CT scan right now.

## 2022-08-26 NOTE — Assessment & Plan Note (Signed)
Continue to avoid antiinflammatories.  Stay hydrated.  Follow.  Follow metabolic panel.  

## 2022-08-26 NOTE — Assessment & Plan Note (Signed)
Continue prilosec

## 2022-08-26 NOTE — Assessment & Plan Note (Signed)
Continue lipitor.  Low cholesterol diet and exercise.  Follow lipid panel and liver function tests.   

## 2022-08-26 NOTE — Telephone Encounter (Signed)
Pt returned Carrie Noble call. Transferred.

## 2022-08-26 NOTE — Assessment & Plan Note (Signed)
Saw Dr O'Connell 06/10/21 - recommended f/u ultrasound in 2 years.   

## 2022-08-29 ENCOUNTER — Encounter: Payer: Medicare Other | Admitting: Gastroenterology

## 2022-08-30 ENCOUNTER — Telehealth: Payer: Self-pay | Admitting: Internal Medicine

## 2022-08-30 ENCOUNTER — Ambulatory Visit (INDEPENDENT_AMBULATORY_CARE_PROVIDER_SITE_OTHER): Payer: Medicare Other

## 2022-08-30 DIAGNOSIS — R739 Hyperglycemia, unspecified: Secondary | ICD-10-CM | POA: Diagnosis not present

## 2022-08-30 LAB — HEMOGLOBIN A1C: Hgb A1c MFr Bld: 5.7 % (ref 4.6–6.5)

## 2022-08-30 NOTE — Telephone Encounter (Signed)
Patient called wanting to speak with Rasheedah. I told patient she was busy and she will return her call. She wants to talk about her referrel.

## 2022-08-30 NOTE — Telephone Encounter (Signed)
Pt would like to be called regarding testing

## 2022-08-30 NOTE — Progress Notes (Signed)
error 

## 2022-08-30 NOTE — Telephone Encounter (Signed)
error 

## 2022-08-30 NOTE — Telephone Encounter (Signed)
Patient would like to proceed with CT.

## 2022-08-31 NOTE — Telephone Encounter (Signed)
Noted  

## 2022-09-06 ENCOUNTER — Ambulatory Visit
Admission: RE | Admit: 2022-09-06 | Discharge: 2022-09-06 | Disposition: A | Discharge status: Home / Self Care | Payer: Medicare Other | Source: Ambulatory Visit | Attending: Internal Medicine | Admitting: Internal Medicine

## 2022-09-06 DIAGNOSIS — R634 Abnormal weight loss: Secondary | ICD-10-CM | POA: Diagnosis not present

## 2022-09-06 DIAGNOSIS — R110 Nausea: Secondary | ICD-10-CM | POA: Diagnosis present

## 2022-09-06 DIAGNOSIS — R1033 Periumbilical pain: Secondary | ICD-10-CM | POA: Insufficient documentation

## 2022-09-14 ENCOUNTER — Telehealth: Payer: Self-pay

## 2022-09-14 NOTE — Telephone Encounter (Signed)
Lvm for a return call in regards to ultrasound results see Dr. Lorin Picket message below

## 2022-09-14 NOTE — Telephone Encounter (Signed)
-----   Message from [place] sent at 09/13/2022  9:47 PM EDT ----- Please call and notify - CT scan reveals no acute abnormality.  Diverticulosis. No inflammation.  Hiatal hernia present.  If persistent nausea, abdominal discomfort and weight loss, I would like to refer her to GI for further evaluation. If agreeable, please confirm which GI provider she prefers to see.

## 2022-09-15 NOTE — Telephone Encounter (Signed)
Patient states she is returning our call.  I read Dr. Westley Hummer Scott's message to patient.  Patient states she is willing to see whichever GI doctor Dr. Lorin Picket recommends.  Patient states the last one Dr. Lorin Picket referred her to in [place] refused to see her because she had been to see one in [place].  Patient would like to know if Dr. Lorin Picket thinks it would be ok for her to take some enzymes, patient states she saw this advertised.  Patient states she has started taking B12 chewables since she last saw Dr. Lorin Picket.  Patient states she would like to have more information on diverticulosis.

## 2022-09-16 NOTE — Telephone Encounter (Signed)
Pt notified & forwarded to front desk to schedule appt.

## 2022-09-16 NOTE — Telephone Encounter (Signed)
With increased concerns and questions, can schedule an appt to be reevaluated and discuss.  Also, regarding GI referral, she was recently evaluated at [facility].  Can schedule a f/u there for reevaluation as well.

## 2022-09-20 ENCOUNTER — Telehealth: Payer: Self-pay

## 2022-09-20 NOTE — Telephone Encounter (Signed)
Lvm informing pt she could take otc probiotics.

## 2022-09-20 NOTE — Telephone Encounter (Signed)
-----   Message from [place] sent at 09/18/2022  7:37 PM EDT ----- Ok to take probiotic daily.  Examples of probiotics, culturelle, align or florastor.

## 2022-10-28 ENCOUNTER — Ambulatory Visit: Payer: Medicare Other | Admitting: Internal Medicine

## 2022-10-28 VITALS — BP 120/70 | HR 73 | Temp 98.0°F | Resp 16 | Ht 63.0 in | Wt 128.8 lb

## 2022-10-28 DIAGNOSIS — M79604 Pain in right leg: Secondary | ICD-10-CM | POA: Diagnosis not present

## 2022-10-28 DIAGNOSIS — R682 Dry mouth, unspecified: Secondary | ICD-10-CM | POA: Diagnosis not present

## 2022-10-28 DIAGNOSIS — N1831 Chronic kidney disease, stage 3a: Secondary | ICD-10-CM

## 2022-10-28 DIAGNOSIS — I10 Essential (primary) hypertension: Secondary | ICD-10-CM

## 2022-10-28 DIAGNOSIS — M25542 Pain in joints of left hand: Secondary | ICD-10-CM

## 2022-10-28 DIAGNOSIS — R351 Nocturia: Secondary | ICD-10-CM | POA: Diagnosis not present

## 2022-10-28 DIAGNOSIS — Z23 Encounter for immunization: Secondary | ICD-10-CM

## 2022-10-28 DIAGNOSIS — M25541 Pain in joints of right hand: Secondary | ICD-10-CM | POA: Diagnosis not present

## 2022-10-28 DIAGNOSIS — M2550 Pain in unspecified joint: Secondary | ICD-10-CM | POA: Insufficient documentation

## 2022-10-28 DIAGNOSIS — E7800 Pure hypercholesterolemia, unspecified: Secondary | ICD-10-CM | POA: Diagnosis not present

## 2022-10-28 DIAGNOSIS — I700 Atherosclerosis of aorta: Secondary | ICD-10-CM

## 2022-10-28 DIAGNOSIS — R739 Hyperglycemia, unspecified: Secondary | ICD-10-CM

## 2022-10-28 DIAGNOSIS — F439 Reaction to severe stress, unspecified: Secondary | ICD-10-CM

## 2022-10-28 DIAGNOSIS — K219 Gastro-esophageal reflux disease without esophagitis: Secondary | ICD-10-CM

## 2022-10-28 DIAGNOSIS — E041 Nontoxic single thyroid nodule: Secondary | ICD-10-CM

## 2022-10-28 DIAGNOSIS — H04123 Dry eye syndrome of bilateral lacrimal glands: Secondary | ICD-10-CM

## 2022-10-28 DIAGNOSIS — R109 Unspecified abdominal pain: Secondary | ICD-10-CM

## 2022-10-28 DIAGNOSIS — M79605 Pain in left leg: Secondary | ICD-10-CM

## 2022-10-28 DIAGNOSIS — K2270 Barrett's esophagus without dysplasia: Secondary | ICD-10-CM

## 2022-10-28 LAB — HEPATIC FUNCTION PANEL
ALT: 15 U/L (ref 0–35)
AST: 21 U/L (ref 0–37)
Albumin: 4.3 g/dL (ref 3.5–5.2)
Alkaline Phosphatase: 103 U/L (ref 39–117)
Bilirubin, Direct: 0 mg/dL (ref 0.0–0.3)
Total Bilirubin: 0.6 mg/dL (ref 0.2–1.2)
Total Protein: 7.3 g/dL (ref 6.0–8.3)

## 2022-10-28 LAB — BASIC METABOLIC PANEL
BUN: 20 mg/dL (ref 6–23)
CO2: 29 meq/L (ref 19–32)
Calcium: 9.4 mg/dL (ref 8.4–10.5)
Chloride: 102 meq/L (ref 96–112)
Creatinine, Ser: 0.99 mg/dL (ref 0.40–1.20)
GFR: 52.75 mL/min — ABNORMAL LOW (ref >60.00)
Glucose, Bld: 90 mg/dL (ref 70–99)
Potassium: 4 meq/L (ref 3.5–5.1)
Sodium: 140 meq/L (ref 135–145)

## 2022-10-28 LAB — SEDIMENTATION RATE: Sed Rate: 13 mm/h (ref 0–30)

## 2022-10-28 LAB — LIPID PANEL
Cholesterol: 249 mg/dL — ABNORMAL HIGH (ref 0–200)
HDL: 67 mg/dL (ref >39.00)
LDL Cholesterol: 159 mg/dL — ABNORMAL HIGH (ref 0–99)
NonHDL: 182.24
Total CHOL/HDL Ratio: 4
Triglycerides: 118 mg/dL (ref 0.0–149.0)
VLDL: 23.6 mg/dL (ref 0.0–40.0)

## 2022-10-28 LAB — TSH: TSH: 2.71 u[IU]/mL (ref 0.35–5.50)

## 2022-10-28 NOTE — Progress Notes (Unsigned)
Subjective:    Patient ID: Carrie Noble, female    DOB: 01-Oct-1940, 81 y.o.   MRN: [id number]  Patient here for  Chief Complaint  Patient presents with   Medical Management of Chronic Issues    HPI Here for a scheduled follow up. She has long standing GERD s/p Nissen fundoplication which has been maintained on PPI therapy. She also has known Barrett's esophagus without dysplasia noted on EGD with biopsy. Saw GI 06/30/22.  Recommended to continue PPI - omeprazole, anti reflux measures. Recommended  EGD/colonoscopy in 2025.  Recommended benefiber and miralax. Last visit, reported persistent nausea and weight loss.  CT scan - diverticulosis. Hiatal hernia.  No acute abnormality. Discussed CT results. On follow up today, she reports she had a brief period where she felt better.  Now with persistent intermittent nausea.  No vomiting.  Taking benefiber and this is helping her bowels stay regular.  Still some minimal abdominal discomfort. Discussed f/u with GI.  Her GI MD has retired.  Her husband is sick and she prefers not to go out of town for GI MD.  Breathing stable.  No increased cough or congestion. Reports increased dryness in her throat.  Request referral to ENT for further evaluation.  Discussed dry mouth.  Some daytime fatigue.  Dryness of her mouth - worse - night /am.  Discussed sleep apnea and further w/up. Some dry eyes.  Has seen ophthalmology.     Past Medical History:  Diagnosis Date   Allergy    Arthritis    Gastroesophageal reflux    Hiatal hernia 1989   status post Nissen fundoplication    Hx of hysterectomy    Hyperlipidemia    Tachycardia    Patient stated that this has been occuring frequently.   Thyroid disease    Goiter   Past Surgical History:  Procedure Laterality Date   ABDOMINAL HYSTERECTOMY  1980   partial   blepheroplasty b/l eyes Bilateral    BREAST BIOPSY Right    neg   BREAST EXCISIONAL BIOPSY Left 2014   neg   COLONOSCOPY  2015   ESOPHAGOGASTRIC  FUNDOPLICATION  1999   LAPAROSCOPIC NISSEN FUNDOPLICATION  1999   TONSILLECTOMY     as well as goiter   UPPER GI ENDOSCOPY  2015   Family History  Problem Relation Age of Onset   Stroke Mother 77   Arthritis Mother    Heart disease Mother    Hypertension Mother    Stroke Father 7   Hypertension Father    Rheumatic fever Brother        and multiple open heart surgeries   Diabetes Brother        type 2   Stroke Brother    Other Sister        Coronary Atherosclerosis   Stroke Brother    Stroke Sister    Heart disease Sister    Dementia Sister    Cancer Sister        ovarian   Breast cancer Neg Hx    Social History   Socioeconomic History   Marital status: Married    Spouse name: Not on file   Number of children: Not on file   Years of education: Not on file   Highest education level: Not on file  Occupational History   Occupation: Retired    Associate Professor: [employer]  Tobacco Use   Smoking status: Never   Smokeless tobacco: Never  Substance and Sexual Activity  Alcohol use: No    Alcohol/week: 0.0 standard drinks of alcohol   Drug use: No   Sexual activity: Never  Other Topics Concern   Not on file  Social History Narrative   Occasionally drinks coffee.   Social Determinants of Health   Financial Resource Strain: Low Risk  (06/17/2021)   Overall Financial Resource Strain (CARDIA)    Difficulty of Paying Living Expenses: Not hard at all  Food Insecurity: No Food Insecurity (06/17/2021)   Hunger Vital Sign    Worried About Running Out of Food in the Last Year: Never true    Ran Out of Food in the Last Year: Never true  Transportation Needs: No Transportation Needs (06/17/2021)   PRAPARE - Administrator, Civil Service (Medical): No    Lack of Transportation (Non-Medical): No  Physical Activity: Insufficiently Active (06/17/2021)   Exercise Vital Sign    Days of Exercise per Week: 7 days    Minutes of Exercise per Session: 20 min  Stress: No Stress  Concern Present (06/17/2021)   [employer] of Occupational Health - Occupational Stress Questionnaire    Feeling of Stress : Not at all  Social Connections: Unknown (06/09/2020)   Social Connection and Isolation Panel [NHANES]    Frequency of Communication with Friends and Family: Not on file    Frequency of Social Gatherings with Friends and Family: Not on file    Attends Religious Services: Not on file    Active Member of Clubs or Organizations: Not on file    Attends Banker Meetings: Not on file    Marital Status: Married     Review of Systems  Constitutional:  Positive for fatigue. Negative for appetite change and unexpected weight change.  HENT:  Negative for congestion and sinus pressure.        Dry mouth.  Palpable thyroid  - nodule.   Respiratory:  Negative for cough, chest tightness and shortness of breath.   Cardiovascular:  Negative for chest pain and palpitations.  Gastrointestinal:  Negative for nausea and vomiting.       Abdominal discomfort as outlined.   Genitourinary:  Negative for difficulty urinating and dysuria.  Musculoskeletal:  Negative for joint swelling and myalgias.  Skin:  Negative for color change and rash.  Neurological:  Negative for dizziness and headaches.  Psychiatric/Behavioral:  Negative for agitation and dysphoric mood.        Objective:     BP 120/70   Pulse 73   Temp 98 F (36.7 C)   Resp 16   Ht 5\' 3"  (1.6 m)   Wt 128 lb 12.8 oz (58.4 kg)   SpO2 98%   BMI 22.82 kg/m  Wt Readings from Last 3 Encounters:  10/28/22 128 lb 12.8 oz (58.4 kg)  08/26/22 126 lb 9.6 oz (57.4 kg)  07/19/22 130 lb 12.8 oz (59.3 kg)    Physical Exam Vitals reviewed.  Constitutional:      General: She is not in acute distress.    Appearance: Normal appearance.  HENT:     Head: Normocephalic and atraumatic.     Right Ear: External ear normal.     Left Ear: External ear normal.  Eyes:     General: No scleral icterus.       Right  eye: No discharge.        Left eye: No discharge.     Conjunctiva/sclera: Conjunctivae normal.  Neck:     Thyroid: No thyromegaly.  Comments: Palpable thyroid nodule.  Cardiovascular:     Rate and Rhythm: Normal rate and regular rhythm.  Pulmonary:     Effort: No respiratory distress.     Breath sounds: Normal breath sounds. No wheezing.  Abdominal:     General: Bowel sounds are normal.     Palpations: Abdomen is soft.     Tenderness: There is no abdominal tenderness.  Musculoskeletal:        General: No swelling or tenderness.     Cervical back: Neck supple. No tenderness.  Lymphadenopathy:     Cervical: No cervical adenopathy.  Skin:    Findings: No erythema or rash.  Neurological:     Mental Status: She is alert.  Psychiatric:        Mood and Affect: Mood normal.        Behavior: Behavior normal.      Outpatient Encounter Medications as of 10/28/2022  Medication Sig   atorvastatin (LIPITOR) 10 MG tablet One tablet q Monday, Wednesday and Friday.   Biotin 1000 MCG tablet Take 1,000 mcg by mouth 3 (three) times daily. Reported on 05/13/2015   Cholecalciferol (VITAMIN D PO) Take by mouth daily. Reported on 05/13/2015   estradiol (ESTRACE VAGINAL) 0.1 MG/GM vaginal cream Apply one applicator q hs x 5 nights and then one applicator 2x week.   Magnesium Oxide 250 MG TABS Take 1 tablet by mouth daily.   omeprazole (PRILOSEC) 20 MG capsule TAKE 1 CAPSULE (20 MG TOTAL) BY MOUTH 2 (TWO) TIMES DAILY BEFORE A MEAL.   polyethylene glycol (MIRALAX) 17 g packet Take 17 g by mouth daily.   sertraline (ZOLOFT) 25 MG tablet Take 1 tablet (25 mg total) by mouth daily.   Vibegron (GEMTESA) 75 MG TABS Take 1 tablet (75 mg total) by mouth daily.   vitamin B-12 (CYANOCOBALAMIN) 1000 MCG tablet Take 1,000 mcg by mouth daily. Reported on 05/13/2015   [DISCONTINUED] Calcium Carbonate (CALCIUM 600 PO) Take by mouth daily. Reported on 05/13/2015 (Patient not taking: Reported on 08/26/2022)    [DISCONTINUED] levalbuterol (XOPENEX HFA) 45 MCG/ACT inhaler Inhale 2 puffs into the lungs every 6 (six) hours as needed for wheezing. (Patient not taking: Reported on 08/26/2022)   [DISCONTINUED] Varenicline Tartrate (TYRVAYA) 0.03 MG/ACT SOLN Place into the nose. (Patient not taking: Reported on 08/26/2022)   Facility-Administered Encounter Medications as of 10/28/2022  Medication   betamethasone acetate-betamethasone sodium phosphate (CELESTONE) injection 3 mg   betamethasone acetate-betamethasone sodium phosphate (CELESTONE) injection 3 mg     Lab Results  Component Value Date   WBC 5.4 08/26/2022   HGB 13.2 08/26/2022   HCT 39.8 08/26/2022   PLT 332.0 08/26/2022   GLUCOSE 90 10/28/2022   CHOL 249 (H) 10/28/2022   TRIG 118.0 10/28/2022   HDL 67.00 10/28/2022   LDLDIRECT 141.0 09/01/2016   LDLCALC 159 (H) 10/28/2022   ALT 15 10/28/2022   AST 21 10/28/2022   NA 140 10/28/2022   K 4.0 10/28/2022   CL 102 10/28/2022   CREATININE 0.99 10/28/2022   BUN 20 10/28/2022   CO2 29 10/28/2022   TSH 2.71 10/28/2022   HGBA1C 5.7 08/30/2022    CT ABDOMEN PELVIS WO CONTRAST  Result Date: 09/13/2022 CLINICAL DATA:  Unintended weight loss. EXAM: CT ABDOMEN AND PELVIS WITHOUT CONTRAST TECHNIQUE: Multidetector CT imaging of the abdomen and pelvis was performed following the standard protocol without IV contrast. RADIATION DOSE REDUCTION: This exam was performed according to the departmental dose-optimization program which includes automated exposure control,  adjustment of the mA and/or kV according to patient size and/or use of iterative reconstruction technique. COMPARISON:  09/17/2020. FINDINGS: Lower chest: Stable linear scarring medial left base. Moderate-sized hiatal hernia. No pleural or pericardial effusion. Left base Hepatobiliary: No focal liver abnormality is seen. No gallstones, gallbladder wall thickening, or biliary dilatation. Pancreas: Unremarkable. No pancreatic ductal dilatation or  surrounding inflammatory changes. Spleen: Normal in size without focal abnormality. Adrenals/Urinary Tract: Adrenal glands are unremarkable. Kidneys are normal, without renal calculi, focal lesion, or hydronephrosis. Urinary bladder is decompressed. Stomach/Bowel: Appendix appears normal. No evidence of bowel wall thickening, distention, or inflammatory changes. Extensive colonic diverticulosis. No diverticulitis. Vascular/Lymphatic: No significant vascular findings are present. No enlarged abdominal or pelvic lymph nodes. Reproductive: Uterus and bilateral adnexa are unremarkable. Other: No abdominal wall hernia or abnormality. No abdominopelvic ascites. Musculoskeletal: No acute or significant osseous findings. IMPRESSION: Diverticulosis. Moderate hiatal hernia. No acute abdominal or pelvic pathology. Electronically Signed   By: Layla Maw M.D.   On: 09/13/2022 19:01       Assessment & Plan:  Hypercholesterolemia Assessment & Plan: Continue lipitor.  Low cholesterol diet and exercise.  Follow lipid panel and liver function tests.   Orders: -     Basic metabolic panel -     Hepatic function panel -     Lipid panel -     TSH  Dry mouth Assessment & Plan: Persistent dry mouth.  Has seen dentist.  Treating acid reflux.  Off antihistamine.  Request referral to ENT for further evaluation.  Also discussed possible sleep apnea as outlined. Agreeable for referral for evaluation.   Orders: -     Ambulatory referral to Pulmonology -     Ambulatory referral to ENT  Nocturia -     Ambulatory referral to Pulmonology  Pain in both lower extremities -     Ambulatory referral to Pulmonology  Need for influenza vaccination -     Flu vaccine trivalent PF, 6mos and older(Flulaval,Afluria,Fluarix,Fluzone)  Arthralgia of both hands -     Sedimentation rate  Abdominal pain, unspecified abdominal location Assessment & Plan: She has long standing GERD s/p Nissen fundoplication which has been  maintained on PPI therapy. She also has known Barrett's esophagus without dysplasia noted on EGD with biopsy. Saw GI 06/30/22.  Recommended to continue PPI - omeprazole, anti reflux measures. Recommended  EGD/colonoscopy in 2025.  Recommended benefiber and miralax. Last visit, reported persistent nausea and weight loss.  CT scan - diverticulosis. Hiatal hernia.  No acute abnormality. Discussed CT results.  On follow up today, she reports she had a brief period where she felt better.  Now with persistent intermittent nausea.  No vomiting.  Taking benefiber and this is helping her bowels stay regular.  Still some minimal abdominal discomfort. Discussed f/u with GI.  Her GI MD has retired.  Her husband is sick and she prefers not to go out of town for GI MD.    Aortic atherosclerosis [facility]) Assessment & Plan: Continue lipitor.    Barrett's esophagus without dysplasia Assessment & Plan: She has long standing GERD s/p Nissen fundoplication which has been maintained on PPI therapy. She also has known Barrett's esophagus without dysplasia noted on EGD with biopsy. Saw GI 06/30/22.  Recommended to continue PPI - omeprazole, anti reflux measures.     Benign essential HTN Assessment & Plan: Blood pressure as outlined.  Follow pressures. On no medication currently.  Follow.    Stage 3a chronic kidney disease (HCC) Assessment &  Plan: Continue to avoid antiinflammatories.  Stay hydrated.  Follow.  Follow metabolic panel.    Dry eyes Assessment & Plan: Has seen ophthalmology.     Gastroesophageal reflux disease, unspecified whether esophagitis present Assessment & Plan: Continue prilosec.    Hyperglycemia Assessment & Plan: Follow met b and A1c.    Stress Assessment & Plan: Increased stress.  Discussed.  Have discussed taking zoloft regularly.    Thyroid nodule Assessment & Plan: Saw Dr Gershon Crane 06/10/21 - recommended f/u ultrasound in 2 years.      I spent 45 minutes with the patient.   Time spent discussing her current concerns and symptoms.  Specifically time spent discussing her dry mouth, increased stress and persistent GI issues. Time also spent discussing further w/up, evaluation and treatment.    Dale [place], MD

## 2022-10-30 ENCOUNTER — Encounter: Payer: Self-pay | Admitting: Internal Medicine

## 2022-10-30 NOTE — Assessment & Plan Note (Signed)
Continue to avoid antiinflammatories.  Stay hydrated.  Follow.  Follow metabolic panel.  

## 2022-10-30 NOTE — Assessment & Plan Note (Signed)
She has long standing GERD s/p Nissen fundoplication which has been maintained on PPI therapy. She also has known Barrett's esophagus without dysplasia noted on EGD with biopsy. Saw GI 06/30/22.  Recommended to continue PPI - omeprazole, anti reflux measures. Recommended  EGD/colonoscopy in 2025.  Recommended benefiber and miralax. Last visit, reported persistent nausea and weight loss.  CT scan - diverticulosis. Hiatal hernia.  No acute abnormality. Discussed CT results.  On follow up today, she reports she had a brief period where she felt better.  Now with persistent intermittent nausea.  No vomiting.  Taking benefiber and this is helping her bowels stay regular.  Still some minimal abdominal discomfort. Discussed f/u with GI.  Her GI MD has retired.  Her husband is sick and she prefers not to go out of town for GI MD.

## 2022-10-30 NOTE — Assessment & Plan Note (Signed)
Persistent dry mouth.  Has seen dentist.  Treating acid reflux.  Off antihistamine.  Request referral to ENT for further evaluation.  Also discussed possible sleep apnea as outlined. Agreeable for referral for evaluation.

## 2022-10-30 NOTE — Assessment & Plan Note (Addendum)
Increased stress.  Discussed.  Have discussed taking zoloft regularly.

## 2022-10-30 NOTE — Assessment & Plan Note (Signed)
Follow met b and A1c.  

## 2022-10-30 NOTE — Assessment & Plan Note (Signed)
She has long standing GERD s/p Nissen fundoplication which has been maintained on PPI therapy. She also has known Barrett's esophagus without dysplasia noted on EGD with biopsy. Saw GI 06/30/22.  Recommended to continue PPI - omeprazole, anti reflux measures.

## 2022-10-30 NOTE — Assessment & Plan Note (Signed)
Continue lipitor.  Low cholesterol diet and exercise.  Follow lipid panel and liver function tests.   

## 2022-10-30 NOTE — Assessment & Plan Note (Signed)
Continue prilosec

## 2022-10-30 NOTE — Assessment & Plan Note (Signed)
Blood pressure as outlined.  Follow pressures. On no medication currently.  Follow.

## 2022-10-30 NOTE — Assessment & Plan Note (Signed)
Continue lipitor  ?

## 2022-10-30 NOTE — Assessment & Plan Note (Signed)
Has seen ophthalmology.

## 2022-10-30 NOTE — Assessment & Plan Note (Signed)
Saw Dr O'Connell 06/10/21 - recommended f/u ultrasound in 2 years.   

## 2022-12-05 ENCOUNTER — Other Ambulatory Visit: Payer: Self-pay | Admitting: Internal Medicine

## 2022-12-05 ENCOUNTER — Ambulatory Visit (INDEPENDENT_AMBULATORY_CARE_PROVIDER_SITE_OTHER): Payer: Medicare Other | Admitting: Internal Medicine

## 2022-12-05 ENCOUNTER — Encounter: Payer: Self-pay | Admitting: Internal Medicine

## 2022-12-05 VITALS — BP 126/66 | HR 71 | Temp 97.6°F | Ht 63.0 in | Wt 131.0 lb

## 2022-12-05 DIAGNOSIS — K2100 Gastro-esophageal reflux disease with esophagitis, without bleeding: Secondary | ICD-10-CM

## 2022-12-05 DIAGNOSIS — G4719 Other hypersomnia: Secondary | ICD-10-CM

## 2022-12-05 DIAGNOSIS — J301 Allergic rhinitis due to pollen: Secondary | ICD-10-CM

## 2022-12-05 DIAGNOSIS — F5101 Primary insomnia: Secondary | ICD-10-CM | POA: Diagnosis not present

## 2022-12-05 MED ORDER — MELATONIN 3-10 MG PO TABS
5.0000 mg | ORAL_TABLET | Freq: Every evening | ORAL | 5 refills | Status: DC | PRN
Start: 2022-12-05 — End: 2023-08-31

## 2022-12-05 MED ORDER — MOMETASONE FUROATE 50 MCG/ACT NA SUSP
2.0000 | Freq: Every day | NASAL | 2 refills | Status: DC
Start: 2022-12-05 — End: 2022-12-05

## 2022-12-05 MED ORDER — PANTOPRAZOLE SODIUM 40 MG PO TBEC
40.0000 mg | DELAYED_RELEASE_TABLET | Freq: Every day | ORAL | 5 refills | Status: DC
Start: 2022-12-05 — End: 2023-01-11

## 2022-12-05 NOTE — Progress Notes (Signed)
Name: MAKEYLA DESRAVINES MRN: [id number] DOB: December 31, 1940    CHIEF COMPLAINT:  EXCESSIVE DAYTIME SLEEPINESS Restless sleep and insomnia   HISTORY OF PRESENT ILLNESS: Patient is seen today for problems and issues with sleep related to excessive daytime sleepiness Patient  has been having sleep problems for many years + Nonrefreshing sleep  Discussed sleep data and reviewed with patient.  Patient had sleep study in 2020 The recommendation from the sleep study Sleep hygiene Avoid supine position  Patient has a hard time falling asleep patient has taken melatonin in the past and has helped Couple hours after falling asleep her acid reflux wakes her up she takes omeprazole twice a day however is not helping Patient has a history of hernia surgery repair many years ago Occasionally patient watches television Sleep hygiene explained to patient in detail Last meal of the day is around 4 PM Patient also has nocturia going on for several years Had seen urology in the past  Patient also has dry mouth along with allergic rhinitis Has taken Zyrtec in the past and it has helped along with Benadryl which has helped   No exacerbation at this time No evidence of heart failure at this time No evidence or signs of infection at this time No respiratory distress No fevers, chills, nausea, vomiting, diarrhea No evidence of lower extremity edema No evidence hemoptysis  Patient also has issues with restless leg syndrome Iron level in 2015 was 100 within normal limits   CT abdomen of the pelvis August 2024 Lungs reviewed at the bases Independently reviewed by me today No acute abnormalities in the lower lung fields  Chest x-ray November 2023 Independently reviewed by me today No acute abnormalities seen No acute cardiopulmonary process  PAST MEDICAL HISTORY :   has a past medical history of Allergy, Arthritis, Gastroesophageal reflux, Hiatal hernia (1989), hysterectomy, Hyperlipidemia,  Tachycardia, and Thyroid disease.  has a past surgical history that includes Tonsillectomy; Laparoscopic Nissen fundoplication (1999); Abdominal hysterectomy (1980); Esophagogastric fundoplication (1999); Colonoscopy (2015); Upper gi endoscopy (2015); Breast biopsy (Right); Breast excisional biopsy (Left, 2014); and blepheroplasty b/l eyes (Bilateral). Prior to Admission medications   Medication Sig Start Date End Date Taking? Authorizing Provider  atorvastatin (LIPITOR) 10 MG tablet One tablet q Monday, Wednesday and Friday. 07/19/22   Dale [place], MD  Biotin 1000 MCG tablet Take 1,000 mcg by mouth 3 (three) times daily. Reported on 05/13/2015    [provider]  Cholecalciferol (VITAMIN D PO) Take by mouth daily. Reported on 05/13/2015    [provider]  estradiol (ESTRACE VAGINAL) 0.1 MG/GM vaginal cream Apply one applicator q hs x 5 nights and then one applicator 2x week. 08/04/22   Dale [place], MD  Magnesium Oxide 250 MG TABS Take 1 tablet by mouth daily.    [provider]  omeprazole (PRILOSEC) 20 MG capsule TAKE 1 CAPSULE (20 MG TOTAL) BY MOUTH 2 (TWO) TIMES DAILY BEFORE A MEAL. 06/22/22   Dale [place], MD  polyethylene glycol (MIRALAX) 17 g packet Take 17 g by mouth daily. 08/13/20   Bing Neighbors, NP  sertraline (ZOLOFT) 25 MG tablet Take 1 tablet (25 mg total) by mouth daily. 07/19/22   Dale [place], MD  Vibegron (GEMTESA) 75 MG TABS Take 1 tablet (75 mg total) by mouth daily. 07/19/22   Dale [place], MD  vitamin B-12 (CYANOCOBALAMIN) 1000 MCG tablet Take 1,000 mcg by mouth daily. Reported on 05/13/2015    [provider]   Allergies  Allergen  Reactions   Darvocet [Propoxyphene N-Acetaminophen]     Patient stated that medication made her heart beat fast.   Morphine And Codeine     Patient stated that medication made her heart beat fast.   Sucralfate Nausea Only    congestion    FAMILY HISTORY:  family history includes Arthritis in  her mother; Cancer in her sister; Dementia in her sister; Diabetes in her brother; Heart disease in her mother and sister; Hypertension in her father and mother; Other in her sister; Rheumatic fever in her brother; Stroke in her brother, brother, and sister; Stroke (age of onset: 57) in her father; Stroke (age of onset: 63) in her mother. SOCIAL HISTORY:  reports that she has never smoked. She has never used smokeless tobacco. She reports that she does not drink alcohol and does not use drugs.   Review of Systems:  Gen:  Denies  fever, sweats, chills weight loss  HEENT: Denies blurred vision, double vision, ear pain, eye pain, hearing loss, nose bleeds, sore throat + Dry mouth Cardiac:  No dizziness, chest pain or heaviness, chest tightness,edema, No JVD Resp:   No cough, -sputum production, -shortness of breath,-wheezing, -hemoptysis,  Gi: +reflux Gu:  Denies bladder incontinence, burning urine Ext:   Denies Joint pain, stiffness or swelling Skin: Denies  skin rash, easy bruising or bleeding or hives Endoc:  Denies polyuria, polydipsia , polyphagia or weight change Psych:   Denies depression, insomnia or hallucinations  Other:  All other systems negative   ALL OTHER ROS ARE NEGATIVE   BP 126/66 (BP Location: Left Arm, Patient Position: Sitting, Cuff Size: Normal)   Pulse 71   Temp 97.6 F (36.4 C) (Temporal)   Ht 5\' 3"  (1.6 m)   Wt 131 lb (59.4 kg)   SpO2 96%   BMI 23.21 kg/m     Physical Examination:   General Appearance: No distress  EYES PERRLA, EOM intact.   NECK Supple, No JVD Pulmonary: normal breath sounds, No wheezing.  CardiovascularNormal S1,S2.  No m/r/g.   Abdomen: Benign, Soft, non-tender. Skin:   warm, no rashes, no ecchymosis  Extremities: normal, no cyanosis, clubbing. Neuro:without focal findings,  speech normal  PSYCHIATRIC: Mood, affect within normal limits.   ALL OTHER ROS ARE NEGATIVE    ASSESSMENT AND PLAN SYNOPSIS 82 year old pleasant  white female seen today for follow-up assessment for insomnia most likely related to underlying acid reflux which is not controlled with the previous hernia surgery in the past in the setting of advanced age coupled with frequent urinations at nighttime with nocturia along with signs symptoms of dry mouth and allergic rhinitis, no acute pulmonary process at this time  Regarding her acid reflux Recommend stopping omeprazole and start Protonix 40 mg daily  Regarding allergic rhinitis start Nasonex which has helped in the past  Regarding insomnia will start melatonin 5 mg This has helped her in the past  Urology referral for nocturia reassessment  At this time patient does not have evidence of sleep apnea nor does she require home sleep test as she has had 2 sleep test in the past     MEDICATION ADJUSTMENTS/LABS AND TESTS ORDERED: Stop Omeprazole, Start Protonix 40 mg daily Start Melatonin 5 mg daily for sleep Start Nasal Spray with Nasonex Referral to Urology for peeing at night  CURRENT MEDICATIONS REVIEWED AT LENGTH WITH PATIENT TODAY   Patient  satisfied with Plan of action and management. All questions answered  Follow up  3 months  Total  Time Spent  65 mins   Lucie Leather, M.D.  [facility]  Medical Director [facility] Medical Director [facility] Cardio-Pulmonary Department

## 2022-12-05 NOTE — Patient Instructions (Addendum)
Stop Omeprazole, Start Protonix 40 mg daily  Start Melatonin 5 mg daily for sleep  Start Nasal Spray with Nasonex  Referral to Urology for peeing at night  Avoid Husbands Snoring!! HAHAHA

## 2022-12-06 ENCOUNTER — Encounter: Payer: Self-pay | Admitting: Internal Medicine

## 2022-12-06 DIAGNOSIS — R351 Nocturia: Secondary | ICD-10-CM | POA: Insufficient documentation

## 2023-01-10 NOTE — Progress Notes (Unsigned)
Subjective:    Patient ID: Carrie Noble, female    DOB: Dec 04, 1940, 82 y.o.   MRN: [id number]  Patient here for No chief complaint on file.   HPI Here for a scheduled follow up. She has long standing GERD s/p Nissen fundoplication which has been maintained on PPI therapy. She also has known Barrett's esophagus without dysplasia noted on EGD with biopsy. Saw GI 06/30/22.  Recommended to continue PPI - omeprazole, anti reflux measures. Recommended  EGD/colonoscopy in 2025.  Recommended benefiber and miralax. Has been having increased acid reflux. Pulmonary recommended stopping omeprazole and starting protonix. Recommended starting nasonex. Also start melatonin. Urology referral for nocturia.    Past Medical History:  Diagnosis Date   Allergy    Arthritis    Gastroesophageal reflux    Hiatal hernia 1989   status post Nissen fundoplication    Hx of hysterectomy    Hyperlipidemia    Tachycardia    Patient stated that this has been occuring frequently.   Thyroid disease    Goiter   Past Surgical History:  Procedure Laterality Date   ABDOMINAL HYSTERECTOMY  1980   partial   blepheroplasty b/l eyes Bilateral    BREAST BIOPSY Right    neg   BREAST EXCISIONAL BIOPSY Left 2014   neg   COLONOSCOPY  2015   ESOPHAGOGASTRIC FUNDOPLICATION  1999   LAPAROSCOPIC NISSEN FUNDOPLICATION  1999   TONSILLECTOMY     as well as goiter   UPPER GI ENDOSCOPY  2015   Family History  Problem Relation Age of Onset   Stroke Mother 66   Arthritis Mother    Heart disease Mother    Hypertension Mother    Stroke Father 61   Hypertension Father    Rheumatic fever Brother        and multiple open heart surgeries   Diabetes Brother        type 2   Stroke Brother    Other Sister        Coronary Atherosclerosis   Stroke Brother    Stroke Sister    Heart disease Sister    Dementia Sister    Cancer Sister        ovarian   Breast cancer Neg Hx    Social History   Socioeconomic History    Marital status: Married    Spouse name: Not on file   Number of children: Not on file   Years of education: Not on file   Highest education level: Not on file  Occupational History   Occupation: Retired    Associate Professor: [employer]  Tobacco Use   Smoking status: Never   Smokeless tobacco: Never  Substance and Sexual Activity   Alcohol use: No    Alcohol/week: 0.0 standard drinks of alcohol   Drug use: No   Sexual activity: Never  Other Topics Concern   Not on file  Social History Narrative   Occasionally drinks coffee.   Social Drivers of Corporate investment banker Strain: Low Risk  (06/17/2021)   Overall Financial Resource Strain (CARDIA)    Difficulty of Paying Living Expenses: Not hard at all  Food Insecurity: No Food Insecurity (06/17/2021)   Hunger Vital Sign    Worried About Running Out of Food in the Last Year: Never true    Ran Out of Food in the Last Year: Never true  Transportation Needs: No Transportation Needs (06/17/2021)   PRAPARE - Administrator, Civil Service (Medical):  No    Lack of Transportation (Non-Medical): No  Physical Activity: Insufficiently Active (06/17/2021)   Exercise Vital Sign    Days of Exercise per Week: 7 days    Minutes of Exercise per Session: 20 min  Stress: No Stress Concern Present (06/17/2021)   [employer] of Occupational Health - Occupational Stress Questionnaire    Feeling of Stress : Not at all  Social Connections: Unknown (06/09/2020)   Social Connection and Isolation Panel [NHANES]    Frequency of Communication with Friends and Family: Not on file    Frequency of Social Gatherings with Friends and Family: Not on file    Attends Religious Services: Not on file    Active Member of Clubs or Organizations: Not on file    Attends Banker Meetings: Not on file    Marital Status: Married     Review of Systems     Objective:     There were no vitals taken for this visit. Wt Readings from Last 3  Encounters:  12/05/22 131 lb (59.4 kg)  10/28/22 128 lb 12.8 oz (58.4 kg)  08/26/22 126 lb 9.6 oz (57.4 kg)    Physical Exam   Outpatient Encounter Medications as of 01/11/2023  Medication Sig   atorvastatin (LIPITOR) 10 MG tablet One tablet q Monday, Wednesday and Friday.   Biotin 1000 MCG tablet Take 1,000 mcg by mouth 3 (three) times daily. Reported on 05/13/2015   Cholecalciferol (VITAMIN D PO) Take by mouth daily. Reported on 05/13/2015   estradiol (ESTRACE VAGINAL) 0.1 MG/GM vaginal cream Apply one applicator q hs x 5 nights and then one applicator 2x week.   fluticasone (FLONASE) 50 MCG/ACT nasal spray Place 2 sprays into both nostrils daily.   Magnesium Oxide 250 MG TABS Take 1 tablet by mouth daily.   Melatonin 3-10 MG TABS Take 5 mg by mouth at bedtime as needed.   omeprazole (PRILOSEC) 20 MG capsule TAKE 1 CAPSULE (20 MG TOTAL) BY MOUTH 2 (TWO) TIMES DAILY BEFORE A MEAL.   pantoprazole (PROTONIX) 40 MG tablet Take 1 tablet (40 mg total) by mouth daily.   polyethylene glycol (MIRALAX) 17 g packet Take 17 g by mouth daily.   sertraline (ZOLOFT) 25 MG tablet Take 1 tablet (25 mg total) by mouth daily.   Vibegron (GEMTESA) 75 MG TABS Take 1 tablet (75 mg total) by mouth daily.   vitamin B-12 (CYANOCOBALAMIN) 1000 MCG tablet Take 1,000 mcg by mouth daily. Reported on 05/13/2015   Facility-Administered Encounter Medications as of 01/11/2023  Medication   betamethasone acetate-betamethasone sodium phosphate (CELESTONE) injection 3 mg   betamethasone acetate-betamethasone sodium phosphate (CELESTONE) injection 3 mg     Lab Results  Component Value Date   WBC 5.4 08/26/2022   HGB 13.2 08/26/2022   HCT 39.8 08/26/2022   PLT 332.0 08/26/2022   GLUCOSE 90 10/28/2022   CHOL 249 (H) 10/28/2022   TRIG 118.0 10/28/2022   HDL 67.00 10/28/2022   LDLDIRECT 141.0 09/01/2016   LDLCALC 159 (H) 10/28/2022   ALT 15 10/28/2022   AST 21 10/28/2022   NA 140 10/28/2022   K 4.0 10/28/2022    CL 102 10/28/2022   CREATININE 0.99 10/28/2022   BUN 20 10/28/2022   CO2 29 10/28/2022   TSH 2.71 10/28/2022   HGBA1C 5.7 08/30/2022    CT ABDOMEN PELVIS WO CONTRAST Result Date: 09/13/2022 CLINICAL DATA:  Unintended weight loss. EXAM: CT ABDOMEN AND PELVIS WITHOUT CONTRAST TECHNIQUE: Multidetector CT imaging of the  abdomen and pelvis was performed following the standard protocol without IV contrast. RADIATION DOSE REDUCTION: This exam was performed according to the departmental dose-optimization program which includes automated exposure control, adjustment of the mA and/or kV according to patient size and/or use of iterative reconstruction technique. COMPARISON:  09/17/2020. FINDINGS: Lower chest: Stable linear scarring medial left base. Moderate-sized hiatal hernia. No pleural or pericardial effusion. Left base Hepatobiliary: No focal liver abnormality is seen. No gallstones, gallbladder wall thickening, or biliary dilatation. Pancreas: Unremarkable. No pancreatic ductal dilatation or surrounding inflammatory changes. Spleen: Normal in size without focal abnormality. Adrenals/Urinary Tract: Adrenal glands are unremarkable. Kidneys are normal, without renal calculi, focal lesion, or hydronephrosis. Urinary bladder is decompressed. Stomach/Bowel: Appendix appears normal. No evidence of bowel wall thickening, distention, or inflammatory changes. Extensive colonic diverticulosis. No diverticulitis. Vascular/Lymphatic: No significant vascular findings are present. No enlarged abdominal or pelvic lymph nodes. Reproductive: Uterus and bilateral adnexa are unremarkable. Other: No abdominal wall hernia or abnormality. No abdominopelvic ascites. Musculoskeletal: No acute or significant osseous findings. IMPRESSION: Diverticulosis. Moderate hiatal hernia. No acute abdominal or pelvic pathology. Electronically Signed   By: Layla Maw M.D.   On: 09/13/2022 19:01       Assessment & Plan:  There are no  diagnoses linked to this encounter.   Dale [place], MD

## 2023-01-11 ENCOUNTER — Ambulatory Visit (INDEPENDENT_AMBULATORY_CARE_PROVIDER_SITE_OTHER): Payer: Medicare Other | Admitting: Internal Medicine

## 2023-01-11 ENCOUNTER — Encounter: Payer: Self-pay | Admitting: Internal Medicine

## 2023-01-11 ENCOUNTER — Telehealth: Payer: Self-pay | Admitting: Internal Medicine

## 2023-01-11 VITALS — BP 126/70 | HR 72 | Temp 96.4°F | Ht 63.0 in | Wt 130.8 lb

## 2023-01-11 DIAGNOSIS — K2100 Gastro-esophageal reflux disease with esophagitis, without bleeding: Secondary | ICD-10-CM

## 2023-01-11 DIAGNOSIS — E041 Nontoxic single thyroid nodule: Secondary | ICD-10-CM

## 2023-01-11 DIAGNOSIS — R109 Unspecified abdominal pain: Secondary | ICD-10-CM | POA: Diagnosis not present

## 2023-01-11 DIAGNOSIS — I700 Atherosclerosis of aorta: Secondary | ICD-10-CM

## 2023-01-11 DIAGNOSIS — R300 Dysuria: Secondary | ICD-10-CM

## 2023-01-11 DIAGNOSIS — K2270 Barrett's esophagus without dysplasia: Secondary | ICD-10-CM | POA: Diagnosis not present

## 2023-01-11 DIAGNOSIS — R739 Hyperglycemia, unspecified: Secondary | ICD-10-CM

## 2023-01-11 DIAGNOSIS — N1831 Chronic kidney disease, stage 3a: Secondary | ICD-10-CM

## 2023-01-11 DIAGNOSIS — F439 Reaction to severe stress, unspecified: Secondary | ICD-10-CM

## 2023-01-11 DIAGNOSIS — E7800 Pure hypercholesterolemia, unspecified: Secondary | ICD-10-CM

## 2023-01-11 LAB — HEPATIC FUNCTION PANEL
ALT: 12 U/L (ref 0–35)
AST: 19 U/L (ref 0–37)
Albumin: 4.5 g/dL (ref 3.5–5.2)
Alkaline Phosphatase: 102 U/L (ref 39–117)
Bilirubin, Direct: 0.1 mg/dL (ref 0.0–0.3)
Total Bilirubin: 0.7 mg/dL (ref 0.2–1.2)
Total Protein: 7.5 g/dL (ref 6.0–8.3)

## 2023-01-11 LAB — BASIC METABOLIC PANEL
BUN: 22 mg/dL (ref 6–23)
CO2: 30 meq/L (ref 19–32)
Calcium: 9.8 mg/dL (ref 8.4–10.5)
Chloride: 101 meq/L (ref 96–112)
Creatinine, Ser: 1.03 mg/dL (ref 0.40–1.20)
GFR: 50.23 mL/min — ABNORMAL LOW (ref >60.00)
Glucose, Bld: 94 mg/dL (ref 70–99)
Potassium: 4.6 meq/L (ref 3.5–5.1)
Sodium: 138 meq/L (ref 135–145)

## 2023-01-11 LAB — LIPASE: Lipase: 15 U/L (ref 11.0–59.0)

## 2023-01-11 MED ORDER — PANTOPRAZOLE SODIUM 40 MG PO TBEC: 40.0000 mg | DELAYED_RELEASE_TABLET | Freq: Two times a day (BID) | ORAL | 2 refills | Status: DC

## 2023-01-11 NOTE — Assessment & Plan Note (Signed)
She has long standing GERD s/p Nissen fundoplication which has been maintained on PPI therapy. She also has known Barrett's esophagus without dysplasia noted on EGD with biopsy. Saw GI 06/30/22.  Recommended to continue PPI - omeprazole, anti reflux measures. Recommended  EGD/colonoscopy in 2025.  Recommended benefiber and miralax. Previous CT scan - diverticulosis. Hiatal hernia.  No acute abnormality. Saw pulmonary and they recommended changing PPI to protonix. Taking protonix daily. On follow up today, she reports increased acid reflux. Also, abdominal discomfort as outlined. No vomiting.  Discussed taking benefiber daily to help keep bowels regular. Increased protonix to bid. Check labs, including met b, liver panel and lipase. Refer back to GI for further evaluation.

## 2023-01-11 NOTE — Assessment & Plan Note (Signed)
Increased stress.  Discussed.  Have discussed taking zoloft regularly.

## 2023-01-11 NOTE — Assessment & Plan Note (Signed)
Check urine to confirm no infection.  

## 2023-01-11 NOTE — Assessment & Plan Note (Signed)
Increased symptoms as outlined. Increase protonix to bid. Avoid foods that aggravate. Decreased caffeine intake. Keep bowels moving. Refer back to GI - question of need for EGD.

## 2023-01-11 NOTE — Assessment & Plan Note (Signed)
Continue lipitor  ?

## 2023-01-11 NOTE — Assessment & Plan Note (Signed)
Follow met b and A1c.  

## 2023-01-11 NOTE — Telephone Encounter (Signed)
Has history of Barretts and GERD.  Increased acid reflux despite medication. Increased abdominal pain. Sees GI - [facility]..  needs f/u appt.  Also, a urine was collected yesterday. It appears orders not placed.  I have placed orders and sent message to lab about running urinalysis and culture.

## 2023-01-11 NOTE — Assessment & Plan Note (Signed)
Continue lipitor.  Low cholesterol diet and exercise.  Follow lipid panel and liver function tests.   

## 2023-01-11 NOTE — Assessment & Plan Note (Signed)
Continue to avoid antiinflammatories.  Stay hydrated.  Follow.  Follow metabolic panel.  

## 2023-01-11 NOTE — Assessment & Plan Note (Signed)
Saw Dr O'Connell 06/10/21 - recommended f/u ultrasound in 2 years.   

## 2023-01-12 NOTE — Addendum Note (Signed)
Addended by: Warden Fillers on: 01/12/2023 03:54 PM   Modules accepted: Orders

## 2023-01-14 LAB — URINALYSIS, ROUTINE W REFLEX MICROSCOPIC
Bilirubin Urine: NEGATIVE
Glucose, UA: NEGATIVE
Hgb urine dipstick: NEGATIVE
Ketones, ur: NEGATIVE
Leukocytes,Ua: NEGATIVE
Nitrite: NEGATIVE
Protein, ur: NEGATIVE
Specific Gravity, Urine: 1.022 (ref 1.001–1.035)
pH: 5.5 (ref 5.0–8.0)

## 2023-01-14 LAB — URINE CULTURE
MICRO NUMBER:: 15872060
Result:: NO GROWTH
SPECIMEN QUALITY:: ADEQUATE

## 2023-01-16 ENCOUNTER — Telehealth: Payer: Self-pay

## 2023-01-16 NOTE — Telephone Encounter (Signed)
Message given to receptionist at [facility]. First available was April 2025. She is going to send message back to the nurse to call and work the patient in.

## 2023-01-16 NOTE — Telephone Encounter (Signed)
Copied from CRM [id number]. Topic: General - Other >> Jan 16, 2023  4:44 PM Florestine Avers wrote: Reason for CRM: Had a missed call from "[place]", is requesting a call back from her.

## 2023-01-17 NOTE — Telephone Encounter (Signed)
See result note.  

## 2023-03-07 ENCOUNTER — Ambulatory Visit: Payer: Medicare Other | Admitting: Internal Medicine

## 2023-03-09 ENCOUNTER — Encounter: Payer: Self-pay | Admitting: Internal Medicine

## 2023-03-09 ENCOUNTER — Ambulatory Visit: Payer: Medicare Other | Admitting: Internal Medicine

## 2023-03-09 ENCOUNTER — Ambulatory Visit (INDEPENDENT_AMBULATORY_CARE_PROVIDER_SITE_OTHER): Payer: Medicare Other | Admitting: Internal Medicine

## 2023-03-09 VITALS — BP 116/62 | HR 74 | Temp 97.7°F | Ht 63.0 in | Wt 135.0 lb

## 2023-03-09 DIAGNOSIS — K2100 Gastro-esophageal reflux disease with esophagitis, without bleeding: Secondary | ICD-10-CM | POA: Diagnosis not present

## 2023-03-09 DIAGNOSIS — F5101 Primary insomnia: Secondary | ICD-10-CM | POA: Diagnosis not present

## 2023-03-09 NOTE — Patient Instructions (Addendum)
Continue Reflux medications as prescribed Continue Melatonin as prescribed Can try Humidifier at night time  Avoid Allergens and Irritants Avoid secondhand smoke Avoid SICK contacts Recommend  Masking  when appropriate Recommend Keep up-to-date with vaccinations

## 2023-03-09 NOTE — Progress Notes (Signed)
Name: ROSEANA RHINE MRN: [id number] DOB: 1940-09-28    CHIEF COMPLAINT:  Follow-up assessment for insomnia   HISTORY OF PRESENT ILLNESS: At last office visit patient had severe reflux I prescribed Protonix which has significantly helped Melatonin was prescribed for insomnia which has significantly helped her symptoms  Discussed sleep data and reviewed with patient.  Patient had sleep study in 2020 The recommendation from the sleep study Sleep hygiene Avoid supine position Repeat sleep study would be beneficial however patient would not wear CPAP   No evidence of heart failure at this time No evidence or signs of infection at this time No respiratory distress No fevers, chills, nausea, vomiting, diarrhea No evidence of lower extremity edema No evidence hemoptysis  Patient also has issues with restless leg syndrome Iron level in 2015 was 100 within normal limits   CT abdomen of the pelvis August 2024 Lungs reviewed at the bases Independently reviewed by me today No acute abnormalities in the lower lung fields  Chest x-ray November 2023 Independently reviewed by me today No acute abnormalities seen No acute cardiopulmonary process  PAST MEDICAL HISTORY :   has a past medical history of Allergy, Arthritis, Gastroesophageal reflux, Hiatal hernia (1989), hysterectomy, Hyperlipidemia, Tachycardia, and Thyroid disease.  has a past surgical history that includes Tonsillectomy; Laparoscopic Nissen fundoplication (1999); Abdominal hysterectomy (1980); Esophagogastric fundoplication (1999); Colonoscopy (2015); Upper gi endoscopy (2015); Breast biopsy (Right); Breast excisional biopsy (Left, 2014); and blepheroplasty b/l eyes (Bilateral). Prior to Admission medications   Medication Sig Start Date End Date Taking? Authorizing Provider  atorvastatin (LIPITOR) 10 MG tablet One tablet q Monday, Wednesday and Friday. 07/19/22   Dale [place], MD  Biotin 1000 MCG tablet Take 1,000 mcg  by mouth 3 (three) times daily. Reported on 05/13/2015    [provider]  Cholecalciferol (VITAMIN D PO) Take by mouth daily. Reported on 05/13/2015    [provider]  estradiol (ESTRACE VAGINAL) 0.1 MG/GM vaginal cream Apply one applicator q hs x 5 nights and then one applicator 2x week. 08/04/22   Dale [place], MD  Magnesium Oxide 250 MG TABS Take 1 tablet by mouth daily.    [provider]  omeprazole (PRILOSEC) 20 MG capsule TAKE 1 CAPSULE (20 MG TOTAL) BY MOUTH 2 (TWO) TIMES DAILY BEFORE A MEAL. 06/22/22   Dale [place], MD  polyethylene glycol (MIRALAX) 17 g packet Take 17 g by mouth daily. 08/13/20   Bing Neighbors, NP  sertraline (ZOLOFT) 25 MG tablet Take 1 tablet (25 mg total) by mouth daily. 07/19/22   Dale [place], MD  Vibegron (GEMTESA) 75 MG TABS Take 1 tablet (75 mg total) by mouth daily. 07/19/22   Dale [place], MD  vitamin B-12 (CYANOCOBALAMIN) 1000 MCG tablet Take 1,000 mcg by mouth daily. Reported on 05/13/2015    [provider]   Allergies  Allergen Reactions   Darvocet [Propoxyphene N-Acetaminophen]     Patient stated that medication made her heart beat fast.   Morphine And Codeine     Patient stated that medication made her heart beat fast.   Sucralfate Nausea Only    congestion    FAMILY HISTORY:  family history includes Arthritis in her mother; Cancer in her sister; Dementia in her sister; Diabetes in her brother; Heart disease in her mother and sister; Hypertension in her father and mother; Other in her sister; Rheumatic fever in her brother; Stroke in her brother, brother, and sister; Stroke (age of onset: 44) in her father; Stroke (  age of onset: 13) in her mother. SOCIAL HISTORY:  reports that she has never smoked. She has never used smokeless tobacco. She reports that she does not drink alcohol and does not use drugs.   BP 116/62 (BP Location: Left Arm, Patient Position: Sitting, Cuff Size: Normal)   Pulse 74   Temp  97.7 F (36.5 C) (Temporal)   Ht 5\' 3"  (1.6 m)   Wt 135 lb (61.2 kg)   SpO2 97%   BMI 23.91 kg/m       Review of Systems: Gen:  Denies  fever, sweats, chills weight loss  HEENT: Denies blurred vision, double vision, ear pain, eye pain, hearing loss, nose bleeds, sore throat Cardiac:  No dizziness, chest pain or heaviness, chest tightness,edema, No JVD Resp:   No cough, -sputum production, -shortness of breath,-wheezing, -hemoptysis,  Other:  All other systems negative   Physical Examination:   General Appearance: No distress  EYES PERRLA, EOM intact.   NECK Supple, No JVD Pulmonary: normal breath sounds, No wheezing.  CardiovascularNormal S1,S2.  No m/r/g.   Abdomen: Benign, Soft, non-tender. Neurology UE/LE 5/5 strength, no focal deficits Ext pulses intact, cap refill intact ALL OTHER ROS ARE NEGATIVE   ASSESSMENT AND PLAN  83 year old pleasant white female seen today for follow-up assessment for insomnia most likely related to underlying acid reflux which seems to be better controlled at this time with Protonix Insomnia seems to be better with melatonin  With the previous sleep study negative for sleep apnea, repeating home sleep test would not be ideal and patient would not want therapy anyway No significant respiratory disease at this time  Regarding reflux Continue Protonix as prescribed Symptoms much improved  Regarding allergic rhinitis start Nasonex which has helped in the past  Regarding insomnia continue melatonin 5 mg This has helped her in the past Insomnia has improved  Urology-nocturia reassessment  At this time patient does not have evidence of sleep apnea nor does she require home sleep test as she has had 2 sleep test in the past     MEDICATION ADJUSTMENTS/LABS AND TESTS ORDERED: Protonix 40 mg daily  Melatonin 5 mg daily for sleep Nasal Spray with Nasonex For dry mouth humidifier at night as needed  CURRENT MEDICATIONS REVIEWED AT  LENGTH WITH PATIENT TODAY   Patient  satisfied with Plan of action and management. All questions answered  Follow up  1 year  Total Time Spent  41 mins   Lucie Leather, M.D.  [facility]  Medical Director [facility] Medical Director [facility] Cardio-Pulmonary Department

## 2023-03-14 ENCOUNTER — Telehealth: Payer: Self-pay

## 2023-03-14 NOTE — Telephone Encounter (Signed)
Patient advised not to take meloxicam. She will use tylenol arthritis. Has follow up on Thursday.

## 2023-03-14 NOTE — Telephone Encounter (Signed)
Copied from CRM [id number]. Topic: Clinical - Medication Question >> Mar 14, 2023  8:42 AM Almira Coaster wrote: Reason for CRM: Patient is calling because she was seen at [facility] and prescribed meloxicam 15 mg due to a fall she suffered last week. She would like to know if Dr.Scott agrees with taking this medication since she experiences acid reflux or if she recommends another medication.

## 2023-03-14 NOTE — Telephone Encounter (Signed)
With [place] monitoring her kidney function, she needs to limit use of antiinflammatory medication. (I  try to avoid as much as possible when GFR is decreased). Can take tylenol arthritis as long as no allergy or problems taking tylenol. Also, needs to monitor blood pressure and monitor for GI side effects.

## 2023-03-14 NOTE — Telephone Encounter (Signed)
FYI- Called patient. She had a fall a week ago and is seeing [facility]- in a boot. Given meloxicam for pain. Wanted to know if it was ok to take. Advised that meloxicam is not for long term use but did not see any reason she could not take medication prn for pain for now. Patient says she has upcoming appointment with you on 2/20 and will talk to you more about it then. Confirmed patient doing ok otherwise. No other injuries noted.

## 2023-03-16 ENCOUNTER — Ambulatory Visit (INDEPENDENT_AMBULATORY_CARE_PROVIDER_SITE_OTHER): Payer: Medicare Other | Admitting: Internal Medicine

## 2023-03-16 VITALS — BP 126/70 | HR 78 | Temp 97.8°F | Resp 16 | Ht 63.0 in | Wt 135.0 lb

## 2023-03-16 DIAGNOSIS — K219 Gastro-esophageal reflux disease without esophagitis: Secondary | ICD-10-CM

## 2023-03-16 DIAGNOSIS — I10 Essential (primary) hypertension: Secondary | ICD-10-CM | POA: Diagnosis not present

## 2023-03-16 DIAGNOSIS — E041 Nontoxic single thyroid nodule: Secondary | ICD-10-CM

## 2023-03-16 DIAGNOSIS — Z1211 Encounter for screening for malignant neoplasm of colon: Secondary | ICD-10-CM

## 2023-03-16 DIAGNOSIS — R350 Frequency of micturition: Secondary | ICD-10-CM

## 2023-03-16 DIAGNOSIS — I700 Atherosclerosis of aorta: Secondary | ICD-10-CM

## 2023-03-16 DIAGNOSIS — F439 Reaction to severe stress, unspecified: Secondary | ICD-10-CM

## 2023-03-16 DIAGNOSIS — N1831 Chronic kidney disease, stage 3a: Secondary | ICD-10-CM

## 2023-03-16 DIAGNOSIS — R739 Hyperglycemia, unspecified: Secondary | ICD-10-CM

## 2023-03-16 DIAGNOSIS — K2270 Barrett's esophagus without dysplasia: Secondary | ICD-10-CM

## 2023-03-16 DIAGNOSIS — E7800 Pure hypercholesterolemia, unspecified: Secondary | ICD-10-CM

## 2023-03-16 NOTE — Progress Notes (Signed)
 Subjective:    Patient ID: Carrie Noble, female    DOB: 01/23/1941, 83 y.o.   MRN: [id number]  Patient here for  Chief Complaint  Patient presents with   Medical Management of Chronic Issues    HPI Here for a scheduled follow up. She has long standing GERD s/p Nissen fundoplication which has been maintained on PPI therapy. She also has known Barrett's esophagus without dysplasia noted on EGD with biopsy. Discussed with her today. Saw reports acid reflux - under reasonable control.  GI 06/30/22. Recommended to continue PPI - anti reflux measures. Recommended EGD/colonoscopy in 2025. Discussed. Recommended benefiber and miralax. Last visit, reported persistent increased acid relfux. Previous CT scan - diverticulosis. Hiatal hernia. No acute abnormality. Pulmonary changed PPI to protonix. Last visit, increased protonix to bid. Recommended referral back to GI. As above, she is taking protonix daily and feels under reasonable control currently.  Recently saw ortho 03/06/23 - right ankle sprain and foot soreness. Recommended rest, ice, compression and elevation. Placed in boot. Prescribed meloxicam. Had questions about meloxicam. Not taking currently. Has seen urology. Was placed on gemtesa. Does help. Cost is an issue. Discussed medication assistance. Breathing stable. No chest pain reported. Discussed bone density.    Past Medical History:  Diagnosis Date   Allergy    Arthritis    Gastroesophageal reflux    Hiatal hernia 1989   status post Nissen fundoplication    Hx of hysterectomy    Hyperlipidemia    Tachycardia    Patient stated that this has been occuring frequently.   Thyroid disease    Goiter   Past Surgical History:  Procedure Laterality Date   ABDOMINAL HYSTERECTOMY  1980   partial   blepheroplasty b/l eyes Bilateral    BREAST BIOPSY Right    neg   BREAST EXCISIONAL BIOPSY Left 2014   neg   COLONOSCOPY  2015   ESOPHAGOGASTRIC FUNDOPLICATION  1999   LAPAROSCOPIC NISSEN  FUNDOPLICATION  1999   TONSILLECTOMY     as well as goiter   UPPER GI ENDOSCOPY  2015   Family History  Problem Relation Age of Onset   Stroke Mother 86   Arthritis Mother    Heart disease Mother    Hypertension Mother    Stroke Father 10   Hypertension Father    Rheumatic fever Brother        and multiple open heart surgeries   Diabetes Brother        type 2   Stroke Brother    Other Sister        Coronary Atherosclerosis   Stroke Brother    Stroke Sister    Heart disease Sister    Dementia Sister    Cancer Sister        ovarian   Breast cancer Neg Hx    Social History   Socioeconomic History   Marital status: Married    Spouse name: Not on file   Number of children: Not on file   Years of education: Not on file   Highest education level: Not on file  Occupational History   Occupation: Retired    Associate Professor: OTHER  Tobacco Use   Smoking status: Never   Smokeless tobacco: Never  Substance and Sexual Activity   Alcohol use: No    Alcohol/week: 0.0 standard drinks of alcohol   Drug use: No   Sexual activity: Never  Other Topics Concern   Not on file  Social History Narrative  Occasionally drinks coffee.   Social Drivers of Corporate investment banker Strain: Low Risk  (06/17/2021)   Overall Financial Resource Strain (CARDIA)    Difficulty of Paying Living Expenses: Not hard at all  Food Insecurity: No Food Insecurity (06/17/2021)   Hunger Vital Sign    Worried About Running Out of Food in the Last Year: Never true    Ran Out of Food in the Last Year: Never true  Transportation Needs: No Transportation Needs (06/17/2021)   PRAPARE - Administrator, Civil Service (Medical): No    Lack of Transportation (Non-Medical): No  Physical Activity: Insufficiently Active (06/17/2021)   Exercise Vital Sign    Days of Exercise per Week: 7 days    Minutes of Exercise per Session: 20 min  Stress: No Stress Concern Present (06/17/2021)   [employer] of  Occupational Health - Occupational Stress Questionnaire    Feeling of Stress : Not at all  Social Connections: Unknown (06/09/2020)   Social Connection and Isolation Panel [NHANES]    Frequency of Communication with Friends and Family: Not on file    Frequency of Social Gatherings with Friends and Family: Not on file    Attends Religious Services: Not on file    Active Member of Clubs or Organizations: Not on file    Attends Banker Meetings: Not on file    Marital Status: Married     Review of Systems  Constitutional:  Negative for appetite change and unexpected weight change.  HENT:  Negative for congestion and sinus pressure.   Respiratory:  Negative for cough, chest tightness and shortness of breath.   Cardiovascular:  Negative for chest pain and palpitations.  Gastrointestinal:  Negative for abdominal pain, diarrhea, nausea and vomiting.       Discussed acid reflux.   Genitourinary:  Negative for difficulty urinating and dysuria.  Musculoskeletal:  Negative for joint swelling and myalgias.       Ankle issues as outlined.   Skin:  Negative for color change and rash.  Neurological:  Negative for dizziness and headaches.  Psychiatric/Behavioral:  Negative for agitation and dysphoric mood.        Objective:     BP 126/70   Pulse 78   Temp 97.8 F (36.6 C)   Resp 16   Ht 5\' 3"  (1.6 m)   Wt 135 lb (61.2 kg)   SpO2 98%   BMI 23.91 kg/m  Wt Readings from Last 3 Encounters:  03/16/23 135 lb (61.2 kg)  03/09/23 135 lb (61.2 kg)  01/11/23 130 lb 12.8 oz (59.3 kg)    Physical Exam Vitals reviewed.  Constitutional:      General: She is not in acute distress.    Appearance: Normal appearance.  HENT:     Head: Normocephalic and atraumatic.     Right Ear: External ear normal.     Left Ear: External ear normal.     Mouth/Throat:     Pharynx: No oropharyngeal exudate or posterior oropharyngeal erythema.  Eyes:     General: No scleral icterus.       Right  eye: No discharge.        Left eye: No discharge.     Conjunctiva/sclera: Conjunctivae normal.  Neck:     Thyroid: No thyromegaly.  Cardiovascular:     Rate and Rhythm: Normal rate and regular rhythm.  Pulmonary:     Effort: No respiratory distress.     Breath sounds: Normal breath  sounds. No wheezing.  Abdominal:     General: Bowel sounds are normal.     Palpations: Abdomen is soft.     Tenderness: There is no abdominal tenderness.  Musculoskeletal:        General: No swelling or tenderness.     Cervical back: Neck supple. No tenderness.     Comments: Boot in place.   Lymphadenopathy:     Cervical: No cervical adenopathy.  Skin:    Findings: No erythema or rash.  Neurological:     Mental Status: She is alert.  Psychiatric:        Mood and Affect: Mood normal.        Behavior: Behavior normal.         Outpatient Encounter Medications as of 03/16/2023  Medication Sig   atorvastatin (LIPITOR) 10 MG tablet One tablet q Monday, Wednesday and Friday.   Biotin 1000 MCG tablet Take 1,000 mcg by mouth 3 (three) times daily. Reported on 05/13/2015   Cholecalciferol (VITAMIN D PO) Take by mouth daily. Reported on 05/13/2015   estradiol (ESTRACE VAGINAL) 0.1 MG/GM vaginal cream Apply one applicator q hs x 5 nights and then one applicator 2x week.   Magnesium Oxide 250 MG TABS Take 1 tablet by mouth daily.   Melatonin 3-10 MG TABS Take 5 mg by mouth at bedtime as needed.   pantoprazole (PROTONIX) 40 MG tablet Take 1 tablet (40 mg total) by mouth 2 (two) times daily before a meal.   polyethylene glycol (MIRALAX) 17 g packet Take 17 g by mouth daily.   sertraline (ZOLOFT) 25 MG tablet Take 1 tablet (25 mg total) by mouth daily.   Vibegron (GEMTESA) 75 MG TABS Take 1 tablet (75 mg total) by mouth daily.   vitamin B-12 (CYANOCOBALAMIN) 1000 MCG tablet Take 1,000 mcg by mouth daily. Reported on 05/13/2015   Facility-Administered Encounter Medications as of 03/16/2023  Medication    betamethasone acetate-betamethasone sodium phosphate (CELESTONE) injection 3 mg   betamethasone acetate-betamethasone sodium phosphate (CELESTONE) injection 3 mg     Lab Results  Component Value Date   WBC 5.4 08/26/2022   HGB 13.2 08/26/2022   HCT 39.8 08/26/2022   PLT 332.0 08/26/2022   GLUCOSE 94 01/11/2023   CHOL 249 (H) 10/28/2022   TRIG 118.0 10/28/2022   HDL 67.00 10/28/2022   LDLDIRECT 141.0 09/01/2016   LDLCALC 159 (H) 10/28/2022   ALT 12 01/11/2023   AST 19 01/11/2023   NA 138 01/11/2023   K 4.6 01/11/2023   CL 101 01/11/2023   CREATININE 1.03 01/11/2023   BUN 22 01/11/2023   CO2 30 01/11/2023   TSH 2.71 10/28/2022   HGBA1C 5.7 08/30/2022    CT ABDOMEN PELVIS WO CONTRAST Result Date: 09/13/2022 CLINICAL DATA:  Unintended weight loss. EXAM: CT ABDOMEN AND PELVIS WITHOUT CONTRAST TECHNIQUE: Multidetector CT imaging of the abdomen and pelvis was performed following the standard protocol without IV contrast. RADIATION DOSE REDUCTION: This exam was performed according to the departmental dose-optimization program which includes automated exposure control, adjustment of the mA and/or kV according to patient size and/or use of iterative reconstruction technique. COMPARISON:  09/17/2020. FINDINGS: Lower chest: Stable linear scarring medial left base. Moderate-sized hiatal hernia. No pleural or pericardial effusion. Left base Hepatobiliary: No focal liver abnormality is seen. No gallstones, gallbladder wall thickening, or biliary dilatation. Pancreas: Unremarkable. No pancreatic ductal dilatation or surrounding inflammatory changes. Spleen: Normal in size without focal abnormality. Adrenals/Urinary Tract: Adrenal glands are unremarkable. Kidneys are normal,  without renal calculi, focal lesion, or hydronephrosis. Urinary bladder is decompressed. Stomach/Bowel: Appendix appears normal. No evidence of bowel wall thickening, distention, or inflammatory changes. Extensive colonic  diverticulosis. No diverticulitis. Vascular/Lymphatic: No significant vascular findings are present. No enlarged abdominal or pelvic lymph nodes. Reproductive: Uterus and bilateral adnexa are unremarkable. Other: No abdominal wall hernia or abnormality. No abdominopelvic ascites. Musculoskeletal: No acute or significant osseous findings. IMPRESSION: Diverticulosis. Moderate hiatal hernia. No acute abdominal or pelvic pathology. Electronically Signed   By: Layla Maw M.D.   On: 09/13/2022 19:01       Assessment & Plan:  Urinary frequency Assessment & Plan: Has seen urology. Gemtesa helps. Cost is an issue. Refer to medication asst.    Thyroid nodule Assessment & Plan: Saw Dr Gershon Crane 06/10/21 - recommended f/u ultrasound in 2 years.  Due f/u this year.     Stress Assessment & Plan: Continue zoloft.    Hyperglycemia Assessment & Plan: Low carb diet and exercise. Follow met b and A1c.    Hypercholesterolemia Assessment & Plan: Continue statin medication. Low cholesterol diet and exercise. Follow lipid panel and liver function tests.    Gastroesophageal reflux disease, unspecified whether esophagitis present Assessment & Plan: On protonix. Upper symptoms are better. Due this year for EGD and colonoscopy. Request referral to [facility].   Orders: -     Ambulatory referral to Gastroenterology  Stage 3a chronic kidney disease [facility]) Assessment & Plan: Continue to avoid antiinflammatory medication. Discussed. Ankle appears to be doing ok without meloxicam. Follow metabolic panel.    Benign essential HTN Assessment & Plan: On no medication. Follow pressures.     Barrett's esophagus without dysplasia Assessment & Plan: With documented history of Barretts. Upper symptoms are better.  Continue PPI. Per review, due f/u EGD this year.   Orders: -     Ambulatory referral to Gastroenterology  Aortic atherosclerosis [facility]) Assessment & Plan: Continue lipitor.    Colon  cancer screening -     Ambulatory referral to Gastroenterology     Dale Herndon, MD

## 2023-03-19 ENCOUNTER — Encounter: Payer: Self-pay | Admitting: Internal Medicine

## 2023-03-19 NOTE — Assessment & Plan Note (Signed)
 Continue statin medication.  Low cholesterol diet and exercise.  Follow lipid panel and liver function tests.

## 2023-03-19 NOTE — Assessment & Plan Note (Signed)
 Continue lipitor  ?

## 2023-03-19 NOTE — Assessment & Plan Note (Signed)
 On protonix. Upper symptoms are better. Due this year for EGD and colonoscopy. Request referral to [facility].

## 2023-03-19 NOTE — Assessment & Plan Note (Signed)
 Low-carb diet and exercise.  Follow met b and A1c.

## 2023-03-19 NOTE — Assessment & Plan Note (Signed)
 Continue zoloft

## 2023-03-19 NOTE — Assessment & Plan Note (Signed)
 Has seen urology. Gemtesa helps. Cost is an issue. Refer to medication asst.

## 2023-03-19 NOTE — Assessment & Plan Note (Signed)
 Saw Dr Gershon Crane 06/10/21 - recommended f/u ultrasound in 2 years.  Due f/u this year.

## 2023-03-19 NOTE — Assessment & Plan Note (Signed)
 With documented history of Barretts. Upper symptoms are better.  Continue PPI. Per review, due f/u EGD this year.

## 2023-03-19 NOTE — Assessment & Plan Note (Signed)
 Continue to avoid antiinflammatory medication. Discussed. Ankle appears to be doing ok without meloxicam. Follow metabolic panel.

## 2023-03-19 NOTE — Assessment & Plan Note (Signed)
On no medication.  Follow pressures.   ?

## 2023-03-27 ENCOUNTER — Telehealth: Payer: Self-pay | Admitting: Internal Medicine

## 2023-03-27 DIAGNOSIS — R739 Hyperglycemia, unspecified: Secondary | ICD-10-CM

## 2023-03-27 DIAGNOSIS — E7800 Pure hypercholesterolemia, unspecified: Secondary | ICD-10-CM

## 2023-03-27 NOTE — Telephone Encounter (Signed)
 Patient need lab orders.

## 2023-03-28 ENCOUNTER — Other Ambulatory Visit (INDEPENDENT_AMBULATORY_CARE_PROVIDER_SITE_OTHER): Payer: Medicare Other

## 2023-03-28 DIAGNOSIS — E7800 Pure hypercholesterolemia, unspecified: Secondary | ICD-10-CM

## 2023-03-28 DIAGNOSIS — R739 Hyperglycemia, unspecified: Secondary | ICD-10-CM | POA: Diagnosis not present

## 2023-03-28 LAB — HEPATIC FUNCTION PANEL
ALT: 13 U/L (ref 0–35)
AST: 18 U/L (ref 0–37)
Albumin: 4.5 g/dL (ref 3.5–5.2)
Alkaline Phosphatase: 99 U/L (ref 39–117)
Bilirubin, Direct: 0.1 mg/dL (ref 0.0–0.3)
Total Bilirubin: 0.7 mg/dL (ref 0.2–1.2)
Total Protein: 7.2 g/dL (ref 6.0–8.3)

## 2023-03-28 LAB — LIPID PANEL
Cholesterol: 208 mg/dL — ABNORMAL HIGH (ref 0–200)
HDL: 61.7 mg/dL (ref >39.00)
LDL Cholesterol: 119 mg/dL — ABNORMAL HIGH (ref 0–99)
NonHDL: 145.87
Total CHOL/HDL Ratio: 3
Triglycerides: 133 mg/dL (ref 0.0–149.0)
VLDL: 26.6 mg/dL (ref 0.0–40.0)

## 2023-03-28 LAB — HEMOGLOBIN A1C: Hgb A1c MFr Bld: 5.7 % (ref 4.6–6.5)

## 2023-03-28 LAB — BASIC METABOLIC PANEL
BUN: 21 mg/dL (ref 6–23)
CO2: 30 meq/L (ref 19–32)
Calcium: 9.9 mg/dL (ref 8.4–10.5)
Chloride: 102 meq/L (ref 96–112)
Creatinine, Ser: 0.97 mg/dL (ref 0.40–1.20)
GFR: 53.9 mL/min — ABNORMAL LOW (ref >60.00)
Glucose, Bld: 98 mg/dL (ref 70–99)
Potassium: 5.5 meq/L — ABNORMAL HIGH (ref 3.5–5.1)
Sodium: 139 meq/L (ref 135–145)

## 2023-03-29 ENCOUNTER — Other Ambulatory Visit: Payer: Self-pay

## 2023-03-29 DIAGNOSIS — E875 Hyperkalemia: Secondary | ICD-10-CM

## 2023-03-30 ENCOUNTER — Other Ambulatory Visit
Admission: RE | Admit: 2023-03-30 | Discharge: 2023-03-30 | Disposition: A | Discharge status: Home / Self Care | Source: Ambulatory Visit | Attending: Internal Medicine | Admitting: Internal Medicine

## 2023-03-30 DIAGNOSIS — E875 Hyperkalemia: Secondary | ICD-10-CM | POA: Diagnosis present

## 2023-03-30 LAB — POTASSIUM: Potassium: 4.2 mmol/L (ref 3.5–5.1)

## 2023-04-26 ENCOUNTER — Other Ambulatory Visit: Payer: Self-pay | Admitting: Internal Medicine

## 2023-04-26 DIAGNOSIS — Z1231 Encounter for screening mammogram for malignant neoplasm of breast: Secondary | ICD-10-CM

## 2023-05-23 ENCOUNTER — Encounter: Payer: Self-pay | Admitting: Internal Medicine

## 2023-05-23 DIAGNOSIS — S93401A Sprain of unspecified ligament of right ankle, initial encounter: Secondary | ICD-10-CM | POA: Insufficient documentation

## 2023-05-24 ENCOUNTER — Ambulatory Visit
Admission: RE | Admit: 2023-05-24 | Discharge: 2023-05-24 | Disposition: A | Discharge status: Home / Self Care | Source: Ambulatory Visit | Attending: Internal Medicine | Admitting: Internal Medicine

## 2023-05-24 DIAGNOSIS — Z1231 Encounter for screening mammogram for malignant neoplasm of breast: Secondary | ICD-10-CM | POA: Diagnosis present

## 2023-06-13 ENCOUNTER — Ambulatory Visit (INDEPENDENT_AMBULATORY_CARE_PROVIDER_SITE_OTHER): Payer: Medicare Other | Admitting: Internal Medicine

## 2023-06-13 ENCOUNTER — Encounter: Payer: Self-pay | Admitting: Internal Medicine

## 2023-06-13 VITALS — BP 128/82 | HR 64 | Temp 97.6°F | Ht 63.0 in | Wt 128.4 lb

## 2023-06-13 DIAGNOSIS — R1013 Epigastric pain: Secondary | ICD-10-CM | POA: Insufficient documentation

## 2023-06-13 DIAGNOSIS — I700 Atherosclerosis of aorta: Secondary | ICD-10-CM

## 2023-06-13 DIAGNOSIS — R739 Hyperglycemia, unspecified: Secondary | ICD-10-CM | POA: Diagnosis not present

## 2023-06-13 DIAGNOSIS — R350 Frequency of micturition: Secondary | ICD-10-CM

## 2023-06-13 DIAGNOSIS — N1831 Chronic kidney disease, stage 3a: Secondary | ICD-10-CM

## 2023-06-13 DIAGNOSIS — R634 Abnormal weight loss: Secondary | ICD-10-CM

## 2023-06-13 DIAGNOSIS — E7800 Pure hypercholesterolemia, unspecified: Secondary | ICD-10-CM | POA: Diagnosis not present

## 2023-06-13 DIAGNOSIS — E041 Nontoxic single thyroid nodule: Secondary | ICD-10-CM

## 2023-06-13 DIAGNOSIS — N898 Other specified noninflammatory disorders of vagina: Secondary | ICD-10-CM | POA: Diagnosis not present

## 2023-06-13 DIAGNOSIS — F439 Reaction to severe stress, unspecified: Secondary | ICD-10-CM

## 2023-06-13 DIAGNOSIS — K2270 Barrett's esophagus without dysplasia: Secondary | ICD-10-CM

## 2023-06-13 LAB — URINALYSIS, ROUTINE W REFLEX MICROSCOPIC
Bilirubin Urine: NEGATIVE
Ketones, ur: NEGATIVE
Leukocytes,Ua: NEGATIVE
Nitrite: NEGATIVE
Specific Gravity, Urine: 1.02 (ref 1.000–1.030)
Total Protein, Urine: NEGATIVE
Urine Glucose: NEGATIVE
Urobilinogen, UA: 1 (ref 0.0–1.0)
pH: 6.5 (ref 5.0–8.0)

## 2023-06-13 LAB — BASIC METABOLIC PANEL WITH GFR
BUN: 21 mg/dL (ref 6–23)
CO2: 24 meq/L (ref 19–32)
Calcium: 9.3 mg/dL (ref 8.4–10.5)
Chloride: 104 meq/L (ref 96–112)
Creatinine, Ser: 1.07 mg/dL (ref 0.40–1.20)
GFR: 47.84 mL/min — ABNORMAL LOW (ref >60.00)
Glucose, Bld: 89 mg/dL (ref 70–99)
Potassium: 4.8 meq/L (ref 3.5–5.1)
Sodium: 138 meq/L (ref 135–145)

## 2023-06-13 LAB — HEPATIC FUNCTION PANEL
ALT: 11 U/L (ref 0–35)
AST: 18 U/L (ref 0–37)
Albumin: 4.5 g/dL (ref 3.5–5.2)
Alkaline Phosphatase: 95 U/L (ref 39–117)
Bilirubin, Direct: 0.1 mg/dL (ref 0.0–0.3)
Total Bilirubin: 0.5 mg/dL (ref 0.2–1.2)
Total Protein: 7.4 g/dL (ref 6.0–8.3)

## 2023-06-13 LAB — CBC WITH DIFFERENTIAL/PLATELET
Basophils Absolute: 0.1 10*3/uL (ref 0.0–0.1)
Basophils Relative: 1.1 % (ref 0.0–3.0)
Eosinophils Absolute: 0.2 10*3/uL (ref 0.0–0.7)
Eosinophils Relative: 3.7 % (ref 0.0–5.0)
HCT: 38.8 % (ref 36.0–46.0)
Hemoglobin: 13.1 g/dL (ref 12.0–15.0)
Lymphocytes Relative: 31.6 % (ref 12.0–46.0)
Lymphs Abs: 1.9 10*3/uL (ref 0.7–4.0)
MCHC: 33.8 g/dL (ref 30.0–36.0)
MCV: 93.8 fl (ref 78.0–100.0)
Monocytes Absolute: 0.4 10*3/uL (ref 0.1–1.0)
Monocytes Relative: 7.2 % (ref 3.0–12.0)
Neutro Abs: 3.4 10*3/uL (ref 1.4–7.7)
Neutrophils Relative %: 56.4 % (ref 43.0–77.0)
Platelets: 387 10*3/uL (ref 150.0–400.0)
RBC: 4.13 Mil/uL (ref 3.87–5.11)
RDW: 13.6 % (ref 11.5–15.5)
WBC: 6 10*3/uL (ref 4.0–10.5)

## 2023-06-13 LAB — LIPASE: Lipase: 21 U/L (ref 11.0–59.0)

## 2023-06-13 MED ORDER — ESTRADIOL 0.1 MG/GM VA CREA: TOPICAL_CREAM | VAGINAL | 2 refills | Status: AC

## 2023-06-13 MED ORDER — GEMTESA 75 MG PO TABS: 1.0000 | ORAL_TABLET | Freq: Every day | ORAL | 2 refills | Status: AC

## 2023-06-13 MED ORDER — ATORVASTATIN CALCIUM 10 MG PO TABS: ORAL_TABLET | ORAL | 1 refills | Status: DC

## 2023-06-13 MED ORDER — SERTRALINE HCL 25 MG PO TABS: 25.0000 mg | ORAL_TABLET | Freq: Every day | ORAL | 1 refills | Status: DC

## 2023-06-13 NOTE — Progress Notes (Unsigned)
 Subjective:    Patient ID: Carrie Noble, female    DOB: Sep 13, 1940, 83 y.o.   MRN: [id number]  Patient here for  Chief Complaint  Patient presents with   Medical Management of Chronic Issues    HPI Here for a scheduled follow up. She has long standing GERD s/p Nissen fundoplication which has been maintained on PPI therapy. She also has known Barrett's esophagus without dysplasia noted on EGD with biopsy. On protonix . Saw urology for urinary frequency. Placed on gemtesa . Also previously saw Dr Shelvy Dickens for f/u thyroid  nodule. Last seen 05/2021 - recommended f/u in 2 years. Seeing ortho - last seen 06/01/23 - low back pain. Marvell Slider 02/2023. Referred to PT.   Past Medical History:  Diagnosis Date   Allergy    Arthritis    Gastroesophageal reflux    Hiatal hernia 1989   status post Nissen fundoplication    Hx of hysterectomy    Hyperlipidemia    Tachycardia    Patient stated that this has been occuring frequently.   Thyroid  disease    Goiter   Past Surgical History:  Procedure Laterality Date   ABDOMINAL HYSTERECTOMY  1980   partial   blepheroplasty b/l eyes Bilateral    BREAST BIOPSY Right    neg   BREAST EXCISIONAL BIOPSY Left 2014   neg   COLONOSCOPY  2015   ESOPHAGOGASTRIC FUNDOPLICATION  1999   LAPAROSCOPIC NISSEN FUNDOPLICATION  1999   TONSILLECTOMY     as well as goiter   UPPER GI ENDOSCOPY  2015   Family History  Problem Relation Age of Onset   Stroke Mother 40   Arthritis Mother    Heart disease Mother    Hypertension Mother    Stroke Father 53   Hypertension Father    Rheumatic fever Brother        and multiple open heart surgeries   Diabetes Brother        type 2   Stroke Brother    Other Sister        Coronary Atherosclerosis   Stroke Brother    Stroke Sister    Heart disease Sister    Dementia Sister    Cancer Sister        ovarian   Breast cancer Neg Hx    Social History   Socioeconomic History   Marital status: Married    Spouse name: Not  on file   Number of children: Not on file   Years of education: Not on file   Highest education level: Not on file  Occupational History   Occupation: Retired    Associate Professor: [employer]  Tobacco Use   Smoking status: Never   Smokeless tobacco: Never  Substance and Sexual Activity   Alcohol use: No    Alcohol/week: 0.0 standard drinks of alcohol   Drug use: No   Sexual activity: Never  Other Topics Concern   Not on file  Social History Narrative   Occasionally drinks coffee.   Social Drivers of Corporate investment banker Strain: Low Risk  (06/17/2021)   Overall Financial Resource Strain (CARDIA)    Difficulty of Paying Living Expenses: Not hard at all  Food Insecurity: No Food Insecurity (06/17/2021)   Hunger Vital Sign    Worried About Running Out of Food in the Last Year: Never true    Ran Out of Food in the Last Year: Never true  Transportation Needs: No Transportation Needs (06/17/2021)   PRAPARE - Transportation  Lack of Transportation (Medical): No    Lack of Transportation (Non-Medical): No  Physical Activity: Insufficiently Active (06/17/2021)   Exercise Vital Sign    Days of Exercise per Week: 7 days    Minutes of Exercise per Session: 20 min  Stress: No Stress Concern Present (06/17/2021)   [employer] of Occupational Health - Occupational Stress Questionnaire    Feeling of Stress : Not at all  Social Connections: Unknown (06/09/2020)   Social Connection and Isolation Panel [NHANES]    Frequency of Communication with Friends and Family: Not on file    Frequency of Social Gatherings with Friends and Family: Not on file    Attends Religious Services: Not on file    Active Member of Clubs or Organizations: Not on file    Attends Engineer, structural: Not on file    Marital Status: Married     Review of Systems     Objective:     BP 128/82   Pulse 64   Temp 97.6 F (36.4 C)   Ht 5\' 3"  (1.6 m)   Wt 128 lb 6.4 oz (58.2 kg)   SpO2 97%   BMI 22.75  kg/m  Wt Readings from Last 3 Encounters:  06/13/23 128 lb 6.4 oz (58.2 kg)  03/16/23 135 lb (61.2 kg)  03/09/23 135 lb (61.2 kg)    Physical Exam  {Perform Simple Foot Exam  Perform Detailed exam:1} {Insert foot Exam (Optional):30965}   Outpatient Encounter Medications as of 06/13/2023  Medication Sig   Biotin 1000 MCG tablet Take 1,000 mcg by mouth 3 (three) times daily. Reported on 05/13/2015   Cholecalciferol (VITAMIN D  PO) Take by mouth daily. Reported on 05/13/2015   Magnesium Oxide 250 MG TABS Take 1 tablet by mouth daily.   Melatonin 3-10 MG TABS Take 5 mg by mouth at bedtime as needed.   pantoprazole  (PROTONIX ) 40 MG tablet Take 1 tablet (40 mg total) by mouth 2 (two) times daily before a meal.   polyethylene glycol (MIRALAX ) 17 g packet Take 17 g by mouth daily.   vitamin B-12 (CYANOCOBALAMIN) 1000 MCG tablet Take 1,000 mcg by mouth daily. Reported on 05/13/2015   atorvastatin  (LIPITOR) 10 MG tablet One tablet q Monday, Wednesday and Friday.   estradiol  (ESTRACE  VAGINAL) 0.1 MG/GM vaginal cream Apply one applicator q hs x 5 nights and then one applicator 2x week.   sertraline  (ZOLOFT ) 25 MG tablet Take 1 tablet (25 mg total) by mouth daily.   Vibegron  (GEMTESA ) 75 MG TABS Take 1 tablet (75 mg total) by mouth daily.   [DISCONTINUED] atorvastatin  (LIPITOR) 10 MG tablet One tablet q Monday, Wednesday and Friday.   [DISCONTINUED] estradiol  (ESTRACE  VAGINAL) 0.1 MG/GM vaginal cream Apply one applicator q hs x 5 nights and then one applicator 2x week.   [DISCONTINUED] sertraline  (ZOLOFT ) 25 MG tablet Take 1 tablet (25 mg total) by mouth daily.   [DISCONTINUED] Vibegron  (GEMTESA ) 75 MG TABS Take 1 tablet (75 mg total) by mouth daily.   Facility-Administered Encounter Medications as of 06/13/2023  Medication   betamethasone  acetate-betamethasone  sodium phosphate (CELESTONE ) injection 3 mg   betamethasone  acetate-betamethasone  sodium phosphate (CELESTONE ) injection 3 mg     Lab  Results  Component Value Date   WBC 5.4 08/26/2022   HGB 13.2 08/26/2022   HCT 39.8 08/26/2022   PLT 332.0 08/26/2022   GLUCOSE 98 03/28/2023   CHOL 208 (H) 03/28/2023   TRIG 133.0 03/28/2023   HDL 61.70 03/28/2023   LDLDIRECT  141.0 09/01/2016   LDLCALC 119 (H) 03/28/2023   ALT 13 03/28/2023   AST 18 03/28/2023   NA 139 03/28/2023   K 4.2 03/30/2023   CL 102 03/28/2023   CREATININE 0.97 03/28/2023   BUN 21 03/28/2023   CO2 30 03/28/2023   TSH 2.71 10/28/2022   HGBA1C 5.7 03/28/2023    MM 3D SCREENING MAMMOGRAM BILATERAL BREAST Result Date: 05/25/2023 CLINICAL DATA:  Screening. EXAM: DIGITAL SCREENING BILATERAL MAMMOGRAM WITH TOMOSYNTHESIS AND CAD TECHNIQUE: Bilateral screening digital craniocaudal and mediolateral oblique mammograms were obtained. Bilateral screening digital breast tomosynthesis was performed. The images were evaluated with computer-aided detection. COMPARISON:  Previous exam(s). ACR Breast Density Category c: The breasts are heterogeneously dense, which may obscure small masses. FINDINGS: There are no findings suspicious for malignancy. IMPRESSION: No mammographic evidence of malignancy. A result letter of this screening mammogram will be mailed directly to the patient. RECOMMENDATION: Screening mammogram in one year. (Code:SM-B-01Y) BI-RADS CATEGORY  1: Negative. Electronically Signed   By: Alger Infield M.D.   On: 05/25/2023 14:51       Assessment & Plan:  Epigastric pain -     CBC with Differential/Platelet -     Lipase -     US  Abdomen Complete; Future  Hypercholesterolemia -     Basic metabolic panel with GFR -     Hepatic function panel  Hyperglycemia  Vaginal irritation -     Urinalysis, Routine w reflex microscopic -     Urine Culture  Other orders -     Atorvastatin  Calcium ; One tablet q Monday, Wednesday and Friday.  Dispense: 39 tablet; Refill: 1 -     Estradiol ; Apply one applicator q hs x 5 nights and then one applicator 2x week.   Dispense: 42.5 g; Refill: 2 -     Sertraline  HCl; Take 1 tablet (25 mg total) by mouth daily.  Dispense: 90 tablet; Refill: 1 -     Gemtesa ; Take 1 tablet (75 mg total) by mouth daily.  Dispense: 90 tablet; Refill: 2     Dellar Fenton, MD

## 2023-06-14 ENCOUNTER — Ambulatory Visit: Payer: Self-pay | Admitting: Internal Medicine

## 2023-06-14 LAB — URINE CULTURE
MICRO NUMBER:: 16479171
Result:: NO GROWTH
SPECIMEN QUALITY:: ADEQUATE

## 2023-06-16 ENCOUNTER — Other Ambulatory Visit: Payer: Self-pay

## 2023-06-16 DIAGNOSIS — E7800 Pure hypercholesterolemia, unspecified: Secondary | ICD-10-CM

## 2023-06-19 ENCOUNTER — Encounter: Payer: Self-pay | Admitting: Internal Medicine

## 2023-06-19 NOTE — Assessment & Plan Note (Signed)
 Noticed some minimal discomfort on exam - to palpation.  With persistent nausea, weight loss and abdominal discomfort, will check abdominal ultrasound.  Continue PPI. Check labs as outlined.

## 2023-06-19 NOTE — Assessment & Plan Note (Signed)
 Agreeable for urine check. Wanted to hold on vaginal exam. Discussed using estrace  cream. Follow.

## 2023-06-19 NOTE — Assessment & Plan Note (Signed)
 Continue lipitor  ?

## 2023-06-19 NOTE — Assessment & Plan Note (Signed)
Continue zoloft.  Follow.   

## 2023-06-19 NOTE — Assessment & Plan Note (Signed)
 Saw Dr Gershon Crane 06/10/21 - recommended f/u ultrasound in 2 years.  Due f/u this year.

## 2023-06-19 NOTE — Assessment & Plan Note (Signed)
 Continue statin medication. Low cholesterol diet and exercise. Follow lipid panel.

## 2023-06-19 NOTE — Assessment & Plan Note (Signed)
 Low-carb diet and exercise.  Follow met b and A1c.

## 2023-06-19 NOTE — Assessment & Plan Note (Signed)
 With documented history of Barretts. Continue PPI. Per review, due f/u EGD this year. Need to confirm has f/u with GI planned.

## 2023-06-19 NOTE — Assessment & Plan Note (Signed)
 Continue to avoid antiinflammatory medication. Discussed. Stay hydrated. Follow metabolic panel.

## 2023-06-19 NOTE — Assessment & Plan Note (Signed)
 Saw urology. Taking gemtesa . Check urine to confirm no infection.

## 2023-06-19 NOTE — Assessment & Plan Note (Signed)
 Reports some increased nausea and decreased appetite. Some minimal epigastic tenderness to palpation. Continue treatmen tfor acid reflux. Check labs, including liver panel and lipase. Check abdominal ultrasound. Further w/up pending results.

## 2023-06-20 ENCOUNTER — Telehealth: Payer: Self-pay

## 2023-06-20 NOTE — Telephone Encounter (Signed)
 Copied from CRM [id number]. Topic: Clinical - Lab/Test Results >> Jun 20, 2023  9:17 AM Carrie Noble wrote: Reason for CRM:pt called in about a missed call. Call was about lab results.   Unable to understand how to read results grabbed triage nurse.    "Carrie Fenton, MD 06/16/2023  4:28 AM EDT   Please call and notify Carrie Noble that her pancreas test is wnl. Kidney function decreased some from last check. Continue to avoid antiinflammatory medication. We will follow.  Recheck met b in 3-4 weeks. Urine culture negative. No infection. Hgb and liver function tests are wnl." >> Jun 20, 2023  9:24 AM Carrie Noble wrote: Spoke to triage nurse Carrie Noble to clarify meaning on results did not understand "wnl" was told mean within normal limits.   Read results as follows on chart. Pt understood and had no further questions

## 2023-06-20 NOTE — Telephone Encounter (Signed)
 Noted

## 2023-06-21 ENCOUNTER — Ambulatory Visit
Admission: RE | Admit: 2023-06-21 | Discharge: 2023-06-21 | Disposition: A | Discharge status: Home / Self Care | Source: Ambulatory Visit | Attending: Internal Medicine | Admitting: Internal Medicine

## 2023-06-21 DIAGNOSIS — R1013 Epigastric pain: Secondary | ICD-10-CM | POA: Insufficient documentation

## 2023-06-23 ENCOUNTER — Other Ambulatory Visit: Payer: Self-pay | Admitting: Internal Medicine

## 2023-06-23 DIAGNOSIS — K828 Other specified diseases of gallbladder: Secondary | ICD-10-CM

## 2023-06-23 DIAGNOSIS — R110 Nausea: Secondary | ICD-10-CM

## 2023-06-23 DIAGNOSIS — R1013 Epigastric pain: Secondary | ICD-10-CM

## 2023-06-23 NOTE — Progress Notes (Signed)
 Order placed for hida scan

## 2023-06-26 ENCOUNTER — Telehealth: Payer: Self-pay | Admitting: *Deleted

## 2023-06-26 NOTE — Telephone Encounter (Signed)
 Patient had her dental work postponed will keep her appt for gallbladder testing tomorrow.

## 2023-06-27 ENCOUNTER — Ambulatory Visit
Admission: RE | Admit: 2023-06-27 | Discharge: 2023-06-27 | Disposition: A | Discharge status: Home / Self Care | Source: Ambulatory Visit | Attending: Internal Medicine | Admitting: Internal Medicine

## 2023-06-27 DIAGNOSIS — R1013 Epigastric pain: Secondary | ICD-10-CM | POA: Insufficient documentation

## 2023-06-27 DIAGNOSIS — K828 Other specified diseases of gallbladder: Secondary | ICD-10-CM | POA: Insufficient documentation

## 2023-06-27 DIAGNOSIS — R110 Nausea: Secondary | ICD-10-CM | POA: Insufficient documentation

## 2023-06-27 MED ORDER — TECHNETIUM TC 99M MEBROFENIN IV KIT
5.3500 | PACK | Freq: Once | INTRAVENOUS | Status: AC | PRN
  Administered 2023-06-27: 5.35 via INTRAVENOUS

## 2023-06-29 ENCOUNTER — Telehealth: Payer: Self-pay

## 2023-06-29 NOTE — Telephone Encounter (Signed)
 Copied from CRM [id number]. Topic: Clinical - Medical Advice >> Jun 29, 2023 10:11 AM [place] wrote: Reason for CRM: patient received a letter from her insurance about hepatobiliary , wanting a call back to go over it

## 2023-07-03 ENCOUNTER — Ambulatory Visit: Payer: Self-pay | Admitting: Internal Medicine

## 2023-07-03 ENCOUNTER — Telehealth: Payer: Self-pay

## 2023-07-03 NOTE — Telephone Encounter (Signed)
 See result note.

## 2023-07-03 NOTE — Telephone Encounter (Signed)
 Copied from CRM [id number]. Topic: General - Other >> Jul 03, 2023  3:11 PM Emylou G wrote: Reason for CRM: Please call patient wants alternative to surgery.. prefers not to have surgery.Aaron Aas

## 2023-07-04 ENCOUNTER — Telehealth: Payer: Self-pay

## 2023-07-04 NOTE — Telephone Encounter (Signed)
 See result note.

## 2023-07-04 NOTE — Telephone Encounter (Signed)
 Copied from CRM [id number]. Topic: General - Call Back - No Documentation >> Jul 04, 2023 11:16 AM Adonis Hoot wrote: Reason for CRM: Patient received a miss call from the office ,there was no documentation as to called her. Patient stated that she will be out of the office but her husband is okay to give any information to.

## 2023-07-04 NOTE — Telephone Encounter (Signed)
 I have already talked with patient. She is agreeable to see [facility].

## 2023-07-05 ENCOUNTER — Telehealth: Payer: Self-pay | Admitting: Internal Medicine

## 2023-07-05 ENCOUNTER — Other Ambulatory Visit: Payer: Self-pay | Admitting: Internal Medicine

## 2023-07-05 DIAGNOSIS — R948 Abnormal results of function studies of other organs and systems: Secondary | ICD-10-CM

## 2023-07-05 DIAGNOSIS — R1013 Epigastric pain: Secondary | ICD-10-CM

## 2023-07-05 NOTE — Telephone Encounter (Signed)
 Copied from CRM [id number]. Topic: Referral - Status >> Jul 04, 2023  5:18 PM Magdalene School wrote: Reason for CRM: Patient is calling to check status of general surgery referral. She would like a call back.

## 2023-07-05 NOTE — Telephone Encounter (Signed)
 Spoke with patient to inform her that the referral was placed today and the referral specialist is still processing it. But, once they received all necessary information they would reach out to the patient and let her know when she is scheduled for an appointment. Patient also advised if she does not hear anything within the next week to let our office know. Patient verbalized understanding.

## 2023-07-05 NOTE — Progress Notes (Signed)
 Order placed for surgery referral.

## 2023-07-07 NOTE — Telephone Encounter (Unsigned)
 Copied from CRM [id number]. Topic: General - Other >> Jul 07, 2023  9:29 AM Annelle Kiel wrote: Reason for CRM: patient is needing a call back regarding a question she has regarding surgery she would like a call back around 2:00pm

## 2023-07-07 NOTE — Telephone Encounter (Signed)
 Patient is wanting to talk to Dr Geralyn Knee about her appt with gen surg. They recommended surgery and pt is unsure. Pt has been scheduled for 6/24. Offered to hold her message for sooner appt but pt stated that the 24th is fine she does not need sooner appt.

## 2023-07-13 ENCOUNTER — Other Ambulatory Visit (INDEPENDENT_AMBULATORY_CARE_PROVIDER_SITE_OTHER)

## 2023-07-13 ENCOUNTER — Ambulatory Visit: Payer: Self-pay | Admitting: Internal Medicine

## 2023-07-13 DIAGNOSIS — E7800 Pure hypercholesterolemia, unspecified: Secondary | ICD-10-CM | POA: Diagnosis not present

## 2023-07-13 LAB — BASIC METABOLIC PANEL WITH GFR
BUN: 20 mg/dL (ref 6–23)
CO2: 28 meq/L (ref 19–32)
Calcium: 9.5 mg/dL (ref 8.4–10.5)
Chloride: 104 meq/L (ref 96–112)
Creatinine, Ser: 1.08 mg/dL (ref 0.40–1.20)
GFR: 47.28 mL/min — ABNORMAL LOW (ref >60.00)
Glucose, Bld: 100 mg/dL — ABNORMAL HIGH (ref 70–99)
Potassium: 4.2 meq/L (ref 3.5–5.1)
Sodium: 138 meq/L (ref 135–145)

## 2023-07-18 ENCOUNTER — Ambulatory Visit (INDEPENDENT_AMBULATORY_CARE_PROVIDER_SITE_OTHER): Admitting: Internal Medicine

## 2023-07-18 ENCOUNTER — Ambulatory Visit: Payer: Self-pay | Admitting: Internal Medicine

## 2023-07-18 ENCOUNTER — Encounter: Payer: Self-pay | Admitting: Internal Medicine

## 2023-07-18 VITALS — BP 114/72 | HR 64 | Temp 98.0°F | Ht 63.0 in | Wt 126.6 lb

## 2023-07-18 DIAGNOSIS — I700 Atherosclerosis of aorta: Secondary | ICD-10-CM | POA: Diagnosis not present

## 2023-07-18 DIAGNOSIS — K2270 Barrett's esophagus without dysplasia: Secondary | ICD-10-CM | POA: Diagnosis not present

## 2023-07-18 DIAGNOSIS — R109 Unspecified abdominal pain: Secondary | ICD-10-CM | POA: Diagnosis not present

## 2023-07-18 DIAGNOSIS — E7800 Pure hypercholesterolemia, unspecified: Secondary | ICD-10-CM

## 2023-07-18 DIAGNOSIS — R0789 Other chest pain: Secondary | ICD-10-CM | POA: Diagnosis not present

## 2023-07-18 DIAGNOSIS — K219 Gastro-esophageal reflux disease without esophagitis: Secondary | ICD-10-CM

## 2023-07-18 DIAGNOSIS — N1831 Chronic kidney disease, stage 3a: Secondary | ICD-10-CM

## 2023-07-18 MED ORDER — ONDANSETRON HCL 4 MG PO TABS: 4.0000 mg | ORAL_TABLET | Freq: Two times a day (BID) | ORAL | 0 refills | Status: DC | PRN

## 2023-07-18 NOTE — Progress Notes (Signed)
 Subjective:    Patient ID: Carrie Noble, female    DOB: Sep 25, 1940, 83 y.o.   MRN: [id number]  Patient here for  Chief Complaint  Patient presents with   Acute Visit    Discuss Gallbladder surgery    HPI Here for a work in appt - work in to discuss possible gallbladder surgery. She has been experiencing intermittent abdominal pain and burning sensation - persistent despite long term treatment for reflux. Recent diagnostic studies include an ultrasound on 2023/07/11, showed gallbladder sludge, indicating thick bile that acts like stones, potentially causing obstruction and pain. A HIDA scan on June 27, 2023, revealed hyperactive gallbladder function at 88%, contributing to her pain.  She has had various tests over the years, including endoscopies. Saw Dr Cesar 07/06/23 - recommended cholecystectomy. Here to discuss. Has questions about the ultrasound and HIDA scan findings. Had questions about surgery. She reports discomfort worsens after eating. Some nausea. No vomiting. Reports some concern regarding some chest discomfort. Was questioning if all related to her gallbladder. She is concern if some of the pain is related to her heart. Discussed referral back to cardiology for reevaluation.    Past Medical History:  Diagnosis Date   Allergy    Arthritis    Gastroesophageal reflux    Hiatal hernia 1989   status post Nissen fundoplication    Hx of hysterectomy    Hyperlipidemia    Tachycardia    Patient stated that this has been occuring frequently.   Thyroid  disease    Goiter   Past Surgical History:  Procedure Laterality Date   ABDOMINAL HYSTERECTOMY  1980   partial   blepheroplasty b/l eyes Bilateral    BREAST BIOPSY Right    neg   BREAST EXCISIONAL BIOPSY Left 2014   neg   COLONOSCOPY  2015   ESOPHAGOGASTRIC FUNDOPLICATION  1999   LAPAROSCOPIC NISSEN FUNDOPLICATION  1999   TONSILLECTOMY     as well as goiter   UPPER GI ENDOSCOPY  2015   Family History  Problem  Relation Age of Onset   Stroke Mother 66   Arthritis Mother    Heart disease Mother    Hypertension Mother    Stroke Father 77   Hypertension Father    Rheumatic fever Brother        and multiple open heart surgeries   Diabetes Brother        type 2   Stroke Brother    Other Sister        Coronary Atherosclerosis   Stroke Brother    Stroke Sister    Heart disease Sister    Dementia Sister    Cancer Sister        ovarian   Breast cancer Neg Hx    Social History   Socioeconomic History   Marital status: Married    Spouse name: Not on file   Number of children: Not on file   Years of education: Not on file   Highest education level: Not on file  Occupational History   Occupation: Retired    Associate Professor: OTHER  Tobacco Use   Smoking status: Never   Smokeless tobacco: Never  Substance and Sexual Activity   Alcohol use: No    Alcohol/week: 0.0 standard drinks of alcohol   Drug use: No   Sexual activity: Never  Other Topics Concern   Not on file  Social History Narrative   Occasionally drinks coffee.   Social Drivers of Health  Financial Resource Strain: Low Risk  (07/06/2023)   Received from [facility]   Overall Financial Resource Strain (CARDIA)    Difficulty of Paying Living Expenses: Not hard at all  Food Insecurity: No Food Insecurity (07/06/2023)   Received from [facility]   Hunger Vital Sign    Within the past 12 months, you worried that your food would run out before you got the money to buy more.: Never true    Within the past 12 months, the food you bought just didn't last and you didn't have money to get more.: Never true  Transportation Needs: No Transportation Needs (07/06/2023)   Received from [facility] - Transportation    In the past 12 months, has lack of transportation kept you from medical appointments or from getting medications?: No    Lack of Transportation (Non-Medical): No   Physical Activity: Insufficiently Active (06/17/2021)   Exercise Vital Sign    Days of Exercise per Week: 7 days    Minutes of Exercise per Session: 20 min  Stress: No Stress Concern Present (06/17/2021)   [employer] of Occupational [employer] - Occupational Stress Questionnaire    Feeling of Stress : Not at all  Social Connections: Unknown (06/09/2020)   Social Connection and Isolation Panel    Frequency of Communication with Friends and Family: Not on file    Frequency of Social Gatherings with Friends and Family: Not on file    Attends Religious Services: Not on file    Active Member of Clubs or Organizations: Not on file    Attends Banker Meetings: Not on file    Marital Status: Married     Review of Systems  Constitutional:  Negative for fever.       Decreased weight from 02/2023 check.   HENT:  Negative for congestion and sinus pressure.   Respiratory:  Negative for cough, shortness of breath and wheezing.   Cardiovascular:  Positive for chest pain. Negative for palpitations and leg swelling.  Gastrointestinal:  Positive for nausea. Negative for vomiting.       Abdominal discomfort as outlined.   Genitourinary:  Negative for difficulty urinating and dysuria.  Musculoskeletal:  Negative for joint swelling and myalgias.  Skin:  Negative for color change and rash.  Neurological:  Negative for dizziness and headaches.  Psychiatric/Behavioral:  Negative for agitation and dysphoric mood.        Increased stress related to current medical issues.        Objective:     BP 114/72   Pulse 64   Temp 98 F (36.7 C)   Ht 5' 3 (1.6 m)   Wt 126 lb 9.6 oz (57.4 kg)   SpO2 98%   BMI 22.43 kg/m  Wt Readings from Last 3 Encounters:  07/18/23 126 lb 9.6 oz (57.4 kg)  06/13/23 128 lb 6.4 oz (58.2 kg)  03/16/23 135 lb (61.2 kg)    Physical Exam Vitals reviewed.  Constitutional:      General: She is not in acute distress.    Appearance: Normal appearance.   HENT:     Head: Normocephalic and atraumatic.     Right Ear: External ear normal.     Left Ear: External ear normal.     Mouth/Throat:     Pharynx: No oropharyngeal exudate or posterior oropharyngeal erythema.   Eyes:     General: No scleral icterus.       Right  eye: No discharge.        Left eye: No discharge.     Conjunctiva/sclera: Conjunctivae normal.   Neck:     Thyroid : No thyromegaly.   Cardiovascular:     Rate and Rhythm: Normal rate and regular rhythm.  Pulmonary:     Effort: No respiratory distress.     Breath sounds: Normal breath sounds. No wheezing.  Abdominal:     General: Bowel sounds are normal.     Palpations: Abdomen is soft.     Comments: No increased pain with palpation.    Musculoskeletal:        General: No swelling or tenderness.     Cervical back: Neck supple. No tenderness.  Lymphadenopathy:     Cervical: No cervical adenopathy.   Skin:    Findings: No erythema or rash.   Neurological:     Mental Status: She is alert.   Psychiatric:        Mood and Affect: Mood normal.        Behavior: Behavior normal.         Outpatient Encounter Medications as of 07/18/2023  Medication Sig   atorvastatin  (LIPITOR) 10 MG tablet One tablet q Monday, Wednesday and Friday.   Biotin 1000 MCG tablet Take 1,000 mcg by mouth 3 (three) times daily. Reported on 05/13/2015   Cholecalciferol (VITAMIN D  PO) Take by mouth daily. Reported on 05/13/2015   estradiol  (ESTRACE  VAGINAL) 0.1 MG/GM vaginal cream Apply one applicator q hs x 5 nights and then one applicator 2x week.   Magnesium Oxide 250 MG TABS Take 1 tablet by mouth daily.   Melatonin 3-10 MG TABS Take 5 mg by mouth at bedtime as needed.   ondansetron  (ZOFRAN ) 4 MG tablet Take 1 tablet (4 mg total) by mouth 2 (two) times daily as needed for nausea or vomiting.   pantoprazole  (PROTONIX ) 40 MG tablet Take 1 tablet (40 mg total) by mouth 2 (two) times daily before a meal.   polyethylene glycol (MIRALAX ) 17 g  packet Take 17 g by mouth daily.   sertraline  (ZOLOFT ) 25 MG tablet Take 1 tablet (25 mg total) by mouth daily.   Vibegron  (GEMTESA ) 75 MG TABS Take 1 tablet (75 mg total) by mouth daily.   vitamin B-12 (CYANOCOBALAMIN) 1000 MCG tablet Take 1,000 mcg by mouth daily. Reported on 05/13/2015   Facility-Administered Encounter Medications as of 07/18/2023  Medication   betamethasone  acetate-betamethasone  sodium phosphate (CELESTONE ) injection 3 mg   betamethasone  acetate-betamethasone  sodium phosphate (CELESTONE ) injection 3 mg     Lab Results  Component Value Date   WBC 6.0 06/13/2023   HGB 13.1 06/13/2023   HCT 38.8 06/13/2023   PLT 387.0 06/13/2023   GLUCOSE 100 (H) 07/13/2023   CHOL 208 (H) 03/28/2023   TRIG 133.0 03/28/2023   HDL 61.70 03/28/2023   LDLDIRECT 141.0 09/01/2016   LDLCALC 119 (H) 03/28/2023   ALT 11 06/13/2023   AST 18 06/13/2023   NA 138 07/13/2023   K 4.2 07/13/2023   CL 104 07/13/2023   CREATININE 1.08 07/13/2023   BUN 20 07/13/2023   CO2 28 07/13/2023   TSH 2.71 10/28/2022   HGBA1C 5.7 03/28/2023    NM Hepato W/EF Result Date: 06/28/2023 CLINICAL DATA:  Chronic upper abdominal pain EXAM: NUCLEAR MEDICINE HEPATOBILIARY IMAGING WITH GALLBLADDER EF TECHNIQUE: Sequential images of the abdomen were obtained out to 60 minutes following intravenous administration of radiopharmaceutical. After oral ingestion of Ensure, gallbladder ejection fraction was determined. At 60  min, normal ejection fraction is greater than 33%. RADIOPHARMACEUTICALS:  5.35 mCi Tc-32m  Choletec  IV COMPARISON:  None Available. FINDINGS: Prompt uptake and biliary excretion of activity by the liver is seen. Gallbladder activity is visualized, consistent with patency of cystic duct. Biliary activity passes into small bowel, consistent with patent common bile duct. Calculated gallbladder ejection fraction is 88%. (Normal gallbladder ejection fraction with Ensure is greater than 33% and less than 80%.)  IMPRESSION: Elevated gallbladder ejection fraction in keeping with a hyperkinetic gallbladder in the appropriate clinical setting. Electronically Signed   By: Dorethia Molt M.D.   On: 06/28/2023 01:35       Assessment & Plan:  Chest discomfort Assessment & Plan: Described chest discomfort recently. Undergoing w/up for abdominal pain and discomfort as outlined. She has been experiencing intermittent abdominal pain and burning sensation - persistent despite long term treatment for reflux. Recent diagnostic studies include an ultrasound on 2023-07-18, showed gallbladder sludge, indicating thick bile that acts like stones, potentially causing obstruction and pain. A HIDA scan on June 27, 2023, revealed hyperactive gallbladder function at 88%, contributing to her pain.  She has had various tests over the years, including endoscopies. Saw Dr Cesar 07/06/23 - recommended cholecystectomy. Here to discuss. Has questions about the ultrasound and HIDA scan findings. Had questions about surgery. She reports discomfort worsens after eating. Some nausea. No vomiting. Check discomfort does not occur with increased activity or exertion. EKG - SR no acute ischemic changes. In reviewing, stress echo 11/2019 - mild to moderate TR, mild MR, trace AI and normal stress echo. Request referral back to cardiology for f/u and to confirm no further cardiac w/up warranted.   Orders: -     EKG 12-Lead -     Ambulatory referral to Cardiology  Abdominal pain, unspecified abdominal location Assessment & Plan: She has long standing GERD s/p Nissen fundoplication which has been maintained on PPI therapy. She also has known Barrett's esophagus without dysplasia noted on EGD with biopsy. Saw GI 06/30/22.  Recommended to continue PPI - omeprazole , anti reflux measures. Recommended  EGD/colonoscopy in 2025.  Recommended benefiber and miralax . Previous CT scan - diverticulosis. Hiatal hernia.  No acute abnormality. She has continued to  experience intermittent abdominal pain and burning sensation - persistent despite long term treatment for reflux. Recent diagnostic studies include an ultrasound on 07-18-2023, showed gallbladder sludge, indicating thick bile that acts like stones, potentially causing obstruction and pain. A HIDA scan on June 27, 2023, revealed hyperactive gallbladder function at 88%, contributing to her pain.  She has had various tests over the years, including endoscopies. Saw Dr Cesar 07/06/23 - recommended cholecystectomy. Here to discuss. Has questions about the ultrasound and HIDA scan findings. Had questions about surgery. She reports discomfort worsens after eating. Som nausea. No vomiting. Discussed surgery. Questions answered. Raised concerns regarding chest pain associated. Request f/u with cardiology.    Aortic atherosclerosis (HCC) Assessment & Plan: Continue lipitor.    Barrett's esophagus without dysplasia Assessment & Plan: With documented history of Barretts. Continue PPI. Per review, due f/u EGD this year. Pursue above w/up. No acid reflux reported.    Stage 3a chronic kidney disease (HCC) Assessment & Plan: Continue to avoid antiinflammatory medication. Discussed. Stay hydrated. Follow metabolic panel.    Gastroesophageal reflux disease, unspecified whether esophagitis present Assessment & Plan: On protonix . Upper symptoms are better. Due this year for EGD and colonoscopy. No acid reflux reported. GI w/up as outlined.  Hypercholesterolemia Assessment & Plan: Continue statin medication. Low cholesterol diet and exercise. Follow lipid panel.    Other orders -     Ondansetron  HCl; Take 1 tablet (4 mg total) by mouth 2 (two) times daily as needed for nausea or vomiting.  Dispense: 14 tablet; Refill: 0     Allena Hamilton, MD

## 2023-07-19 ENCOUNTER — Telehealth: Payer: Self-pay

## 2023-07-19 NOTE — Telephone Encounter (Unsigned)
 Copied from CRM [id number]. Topic: General - Other >> Jul 19, 2023  2:47 PM Turkey A wrote: Reason for CRM: Patient said she was returning Trisha's call-agent called [facility] was informed Sueanne was not there to leave CRM

## 2023-07-20 NOTE — Telephone Encounter (Signed)
 Called patient, unable to leave message. I have not called this patient.

## 2023-07-21 NOTE — Telephone Encounter (Signed)
 Called patient to follow up. She did not need anything. She says she is still trying to make her decision about having surgery. She sees cardio today.

## 2023-07-23 ENCOUNTER — Encounter: Payer: Self-pay | Admitting: Internal Medicine

## 2023-07-23 NOTE — Assessment & Plan Note (Signed)
 Continue lipitor  ?

## 2023-07-23 NOTE — Assessment & Plan Note (Signed)
 Continue statin medication. Low cholesterol diet and exercise. Follow lipid panel.

## 2023-07-23 NOTE — Assessment & Plan Note (Signed)
 With documented history of Barretts. Continue PPI. Per review, due f/u EGD this year. Pursue above w/up. No acid reflux reported.

## 2023-07-23 NOTE — Assessment & Plan Note (Signed)
 On protonix . Upper symptoms are better. Due this year for EGD and colonoscopy. No acid reflux reported. GI w/up as outlined.

## 2023-07-23 NOTE — Assessment & Plan Note (Signed)
 Described chest discomfort recently. Undergoing w/up for abdominal pain and discomfort as outlined. She has been experiencing intermittent abdominal pain and burning sensation - persistent despite long term treatment for reflux. Recent diagnostic studies include an ultrasound on 09-Jul-2023, showed gallbladder sludge, indicating thick bile that acts like stones, potentially causing obstruction and pain. A HIDA scan on June 27, 2023, revealed hyperactive gallbladder function at 88%, contributing to her pain.  She has had various tests over the years, including endoscopies. Saw Dr Cesar 07/06/23 - recommended cholecystectomy. Here to discuss. Has questions about the ultrasound and HIDA scan findings. Had questions about surgery. She reports discomfort worsens after eating. Some nausea. No vomiting. Check discomfort does not occur with increased activity or exertion. EKG - SR no acute ischemic changes. In reviewing, stress echo 11/2019 - mild to moderate TR, mild MR, trace AI and normal stress echo. Request referral back to cardiology for f/u and to confirm no further cardiac w/up warranted.

## 2023-07-23 NOTE — Assessment & Plan Note (Signed)
 She has long standing GERD s/p Nissen fundoplication which has been maintained on PPI therapy. She also has known Barrett's esophagus without dysplasia noted on EGD with biopsy. Saw GI 06/30/22.  Recommended to continue PPI - omeprazole , anti reflux measures. Recommended  EGD/colonoscopy in 2025.  Recommended benefiber and miralax . Previous CT scan - diverticulosis. Hiatal hernia.  No acute abnormality. She has continued to experience intermittent abdominal pain and burning sensation - persistent despite long term treatment for reflux. Recent diagnostic studies include an ultrasound on Jul 21, 2023, showed gallbladder sludge, indicating thick bile that acts like stones, potentially causing obstruction and pain. A HIDA scan on June 27, 2023, revealed hyperactive gallbladder function at 88%, contributing to her pain.  She has had various tests over the years, including endoscopies. Saw Dr Cesar 07/06/23 - recommended cholecystectomy. Here to discuss. Has questions about the ultrasound and HIDA scan findings. Had questions about surgery. She reports discomfort worsens after eating. Som nausea. No vomiting. Discussed surgery. Questions answered. Raised concerns regarding chest pain associated. Request f/u with cardiology.

## 2023-07-23 NOTE — Assessment & Plan Note (Signed)
 Continue to avoid antiinflammatory medication. Discussed. Stay hydrated. Follow metabolic panel.

## 2023-07-24 ENCOUNTER — Other Ambulatory Visit: Payer: Self-pay | Admitting: Internal Medicine

## 2023-07-24 DIAGNOSIS — R002 Palpitations: Secondary | ICD-10-CM

## 2023-07-24 DIAGNOSIS — R0789 Other chest pain: Secondary | ICD-10-CM

## 2023-07-25 ENCOUNTER — Telehealth: Payer: Self-pay

## 2023-07-25 NOTE — Telephone Encounter (Signed)
 Copied from CRM [id number]. Topic: Clinical - Medication Question >> Jul 25, 2023  3:28 PM Shereese L wrote: Reason for CRM: patient has question in reference to heart appt coming up

## 2023-07-25 NOTE — Telephone Encounter (Signed)
 Patient had questions about her CT scheduled for this week but radiology called and answered her questions. She is still thinking about having surgery. She will let us  know if she needs anything.

## 2023-07-25 NOTE — Telephone Encounter (Signed)
 Dr Glendia, disregard this message Sending to pool to call her.

## 2023-07-26 ENCOUNTER — Telehealth (HOSPITAL_COMMUNITY): Payer: Self-pay | Admitting: *Deleted

## 2023-07-26 NOTE — Telephone Encounter (Unsigned)
 Copied from CRM [id number]. Topic: Clinical - Medical Advice >> Jul 26, 2023  8:57 AM Macario HERO wrote: Reason for CRM: Patient is requesting a call from her provider or her nurse regarding the current medications that she is taking and her CT scans that she has scheduled for tomorrow.

## 2023-07-26 NOTE — Telephone Encounter (Signed)
 Reaching out to patient to offer assistance regarding upcoming cardiac imaging study; pt verbalizes understanding of appt date/time, parking situation and where to check in, pre-test NPO status and medications ordered, and verified current allergies; name and call back number provided for further questions should they arise  Larey Brick RN Navigator Cardiac Imaging [facility] [phone] office [phone] cell  Patient to take 25mg  metoprolol tartrate two hours prior to her cardiac CT scan.

## 2023-07-26 NOTE — Telephone Encounter (Signed)
 Pt was calling to let me know she canceled her CT angio for tomorrow because she was nervous about taking metoprolol. After talking with patient, she is going to call back and reschedule.

## 2023-07-27 ENCOUNTER — Ambulatory Visit: Admission: RE | Admit: 2023-07-27 | Source: Ambulatory Visit

## 2023-07-27 ENCOUNTER — Ambulatory Visit
Admission: RE | Admit: 2023-07-27 | Discharge: 2023-07-27 | Disposition: A | Discharge status: Home / Self Care | Source: Ambulatory Visit | Attending: Internal Medicine | Admitting: Internal Medicine

## 2023-07-27 DIAGNOSIS — R002 Palpitations: Secondary | ICD-10-CM | POA: Diagnosis present

## 2023-07-27 DIAGNOSIS — I2510 Atherosclerotic heart disease of native coronary artery without angina pectoris: Secondary | ICD-10-CM | POA: Diagnosis not present

## 2023-07-27 DIAGNOSIS — R0789 Other chest pain: Secondary | ICD-10-CM | POA: Insufficient documentation

## 2023-07-27 MED ORDER — NITROGLYCERIN 0.4 MG SL SUBL
0.8000 mg | SUBLINGUAL_TABLET | Freq: Once | SUBLINGUAL | Status: AC
  Administered 2023-07-27: 0.8 mg via SUBLINGUAL

## 2023-07-27 MED ORDER — IOHEXOL 350 MG/ML SOLN
80.0000 mL | Freq: Once | INTRAVENOUS | Status: AC | PRN
  Administered 2023-07-27: 80 mL via INTRAVENOUS

## 2023-07-27 NOTE — Progress Notes (Signed)
 Patient tolerated CT well. Vital signs stable encourage to drink water throughout day.Reasons explained and verbalized understanding. Ambulated steady gait.

## 2023-08-03 ENCOUNTER — Ambulatory Visit: Payer: Self-pay

## 2023-08-03 NOTE — Telephone Encounter (Signed)
 I know that you talked with her. She had informed you no arm pain. She had mentioned to you, worsening issues with acic reflux. See the most recent message and symptoms. Mentioned discomfort under arm and around. Please f/u with her and confirm GI recs and confirm symptoms. If any concerning chest symptoms or if persistent GI symptoms - needs to be evaluated.

## 2023-08-03 NOTE — Telephone Encounter (Signed)
 Called patient for more information. She says that her reflux is not controlled. She is still having the burning that she has been having. She is seeing Christiane for this. Confirmed no other acute issues. She was wanting to know if she should contact GI or if she should come see Dr Glendia. Advised since [facility]mar is seeing her for this, she should contact them to let her know that symptoms are still uncontrolled. Pt has a follow up with PCP 7/29 and will let us  know if she needs something before.

## 2023-08-03 NOTE — Telephone Encounter (Signed)
 Agree with need for f/u with GI if having persistent reflux.  Is she having arm pain?  The earlier note mentioned arm pain.  If any acute issues, needs to be evaluated.

## 2023-08-03 NOTE — Telephone Encounter (Signed)
FYI Dr Scott   

## 2023-08-03 NOTE — Telephone Encounter (Addendum)
 Called pt and advised her of Dr. Freda comments and asked her about the arm pain. Pt said it wasn't her arm that was hurting it was under her arm and around to her chest area, it was waking her up when she was sleeping, GI told her to sleep with head propped up but it still is bothering her only at night. Pt said she will call GI doc and advised them of sxs and see if they can give her advise over the phone or get her back in for a f/u.  FYI to PCP

## 2023-08-03 NOTE — Telephone Encounter (Signed)
 FYI Only or Action Required?: Action required by provider: clinical question for provider and update on patient condition.  Patient was last seen in primary care on 07/18/2023 by Glendia Shad, MD.  Called Nurse Triage reporting Arm Pain.  Symptoms began several days ago.  Interventions attempted: Nothing.  Symptoms are: gradually worsening.  Triage Disposition: Call PCP Within 24 Hours- Patient declined scheduling and triage. Requesting to speak with Sueanne or Dr. Glendia only to see if Dr. Glendia can see her for this issue. Patient didn't want to go into too many details about her complaint. Patient did say that she say Dr. Jane at [facility] for reflux and wsnt sure of Dr. Glendia wants her to follow up there.   Patient/caregiver understands and will follow disposition?: Unsure   Copied from CRM [id number]. Topic: Clinical - Red Word Triage >> Aug 03, 2023  8:21 AM Larissa RAMAN wrote: [facility] that prompted transfer to Nurse Triage: pain in Lt arm/side Reason for Disposition  [1] Caller requests to speak ONLY to PCP AND [2] NON-URGENT question  Answer Assessment - Initial Assessment Questions 1. REASON FOR CALL or QUESTION: What is your reason for calling today? or How can I best     Patient reports burning pain under left breast. Says that she has had this issue before and wants to know if she should see her PCP or her GI doctor.   2. CALLER: Document the source of call. (e.g., laboratory staff, caregiver or patient).     Patient  Protocols used: PCP Call - No Triage-A-AH

## 2023-08-04 NOTE — Telephone Encounter (Signed)
 LM for patient

## 2023-08-07 ENCOUNTER — Ambulatory Visit: Payer: Self-pay | Admitting: *Deleted

## 2023-08-07 NOTE — Telephone Encounter (Signed)
 noted

## 2023-08-07 NOTE — Telephone Encounter (Signed)
 FYI Only or Action Required?: Action required by provider: request for appointment.  Patient was last seen in primary care on 07/18/2023 by Glendia Shad, MD.  Called Nurse Triage reporting Palpitations.  Symptoms began several days ago.  Interventions attempted: Rest, hydration, or home remedies.  Symptoms are: unchanged.  Triage Disposition: See HCP Within 4 Hours (Or PCP Triage)  Patient/caregiver understands and will follow disposition?: No, wishes to speak with PCP Patient only wants to speak to [place]- call to [facility]- Sueanne is not in office- advised patient only will see PCP. Request send note for review- Patient finally agreed to see someone else- but by then open appointment was gone.    Reason for Disposition  [1] Skipped or extra beat(s) AND [2] increases with exercise or exertion  Answer Assessment - Initial Assessment Questions Patient declines triage- information gathered through conversation- she is only returning call from office and wants to speak to [place]  1. DESCRIPTION: Please describe your heart rate or heartbeat that you are having (e.g., fast/slow, regular/irregular, skipped or extra beats, palpitations)     Palpations - have been better over the week end, indigestion 2. ONSET: When did it start? (e.g., minutes, hours, days)      Last week- worse- has been going on a while 3. DURATION: How long does it last (e.g., seconds, minutes, hours)     Last over the last couple week- feels faster 4. PATTERN Does it come and go, or has it been constant since it started?  Does it get worse with exertion?   Are you feeling it now?     BP- low/high 130/140- systolic  6. HEART RATE: Can you tell me your heart rate? How many beats in 15 seconds?  Note: Not all patients can do this.       Pulse- 123-pulse,1234-pause 7. RECURRENT SYMPTOM: Have you ever had this before? If Yes, ask: When was the last time? and What happened that time?      Yes- patient is  using OTC Magnesium to help 8. CAUSE: What do you think is causing the palpitations?     Not sure- gallbladder 9. CARDIAC HISTORY: Do you have any history of heart disease? (e.g., heart attack, angina, bypass surgery, angioplasty, arrhythmia)      no 10. OTHER SYMPTOMS: Do you have any other symptoms? (e.g., dizziness, chest pain, sweating, difficulty breathing)       Head does no feel exactly right  Protocols used: Heart Rate and Heartbeat Questions-A-AH    Copied from CRM [id number]. Topic: Clinical - Red Word Triage >> Aug 07, 2023  8:12 AM Harlene ORN wrote: Red Word that prompted transfer to Nurse Triage: heart palpitations getting worse

## 2023-08-07 NOTE — Telephone Encounter (Signed)
 See other telephone encounter.

## 2023-08-08 ENCOUNTER — Telehealth: Payer: Self-pay

## 2023-08-08 NOTE — Telephone Encounter (Signed)
 Copied from CRM [id number]. Topic: General - Other >> Aug 08, 2023  2:56 PM Deaijah H wrote: Reason for CRM: Patient would like to speak with Risha regarding gallbladder surgery. Please call [phone]

## 2023-08-09 ENCOUNTER — Ambulatory Visit: Payer: Self-pay

## 2023-08-09 NOTE — Telephone Encounter (Signed)
 Called pt and she stated that she would wait to see Dr. Glendia in July. She wanted to know about medication

## 2023-08-09 NOTE — Telephone Encounter (Signed)
 FYI Only or Action Required?: Action required by provider: clinical question for provider.  Patient was last seen in primary care on 07/18/2023 by Glendia Shad, MD.  Called Nurse Triage reporting Advice Only.  Symptoms began today.   Triage Disposition: Information or Advice Only Call  Patient/caregiver understands and will follow disposition?: Yes    Copied from CRM [id number]. Topic: Clinical - Red Word Triage >> Aug 09, 2023 10:36 AM Jayma L wrote: Red Word that prompted transfer to Nurse Triage: patient waiting on a callback from [facility], called office and she's busy, when I got back on the line patient told me she's feeling light headed or dizzy and she was working outside before she missed the call.   Reason for Disposition  Health information question, no triage required and triager able to answer question    Triager not able to help patient therefore routing encounter to PCP  Answer Assessment - Initial Assessment Questions 1. REASON FOR CALL: What is the main reason for your call? or How can I best help you?    Pt stated she was returning a call from [place] at the office.  Pt stated she is not lightheaded or dizzy: pt stated her head was heavy meaning headache while working outside.  Pt stated she was trying to get fluid list and some information from PCP r/t possible upcoming surgery. Called [facility] and attempted to reach [facility]: she was with a patient.  Patient asked for Kellie to call her back at [phone].  Protocols used: Information Only Call - No Triage-A-AH

## 2023-08-09 NOTE — Telephone Encounter (Signed)
 Called and spoke to pt she wanted to know if she could take some medication that cardiologist suggested her take. Aspirin 81 mg.

## 2023-08-09 NOTE — Telephone Encounter (Signed)
 Left message to return call to our office.

## 2023-08-11 NOTE — Telephone Encounter (Signed)
Called patient.  Phone line busy

## 2023-08-14 ENCOUNTER — Telehealth: Payer: Self-pay | Admitting: Internal Medicine

## 2023-08-14 NOTE — Telephone Encounter (Unsigned)
 Copied from CRM [id number]. Topic: Clinical - Medical Advice >> Aug 14, 2023 12:26 PM Gennette ORN wrote: Reason for CRM: Patient is wanting Sueanne to call back she has concerns about medications and trying to get appointment with Dr.Christina but she doesn't have anything until end of the year. She wants to know do Dr.Scott have any other recommendations as well.

## 2023-08-15 NOTE — Telephone Encounter (Signed)
 Will call GI and see if sooner appt is available.

## 2023-08-15 NOTE — Telephone Encounter (Signed)
 Pt says she has discussed this with Dr Florencio.

## 2023-08-16 NOTE — Telephone Encounter (Signed)
 FYI   Called [facility] to see if could get sooner appt due to patients uncontrolled reflux. Was told by receptionist that first available for Carrie Noble was 12/16 at 3pm. Pt was placed on cancellation list. Just wanted you to be aware how far out appts are.All providers are booked out until end of year.

## 2023-08-16 NOTE — Telephone Encounter (Signed)
 She was just evaluated by [facility] 07/18/23. She was to start voquezna for her GI symptoms. Has she started this medication?  Also, need to clarify what symptoms she is currently having.  Abdominal pain?  Does it occur after eating? N,V,D?

## 2023-08-17 NOTE — Telephone Encounter (Signed)
 Called pt. Unable to leave voicemail due to voicemail being full. If pt calls back okay to relay once relay please notify office. Okay to ask questions.

## 2023-08-18 NOTE — Telephone Encounter (Signed)
 FYI- spoke with patient. She is not having any nausea, vomiting, diarrhea or abdominal pain. She did report a short episode of diarrhea a few weeks ago but resolved. She did not go pick up the new medication from [facility] but she is going to go get it this afternoon. She says she is feeling ok just wondered what she should be doing. Also noted to E2C2 that she was having heart palpitations. When I asked patient about this her response was honey I have been having them, I see Dr Florencio Monday. Confirmed no active acute symptoms. She will start the voquenza. Has a follow up with cardiology on Monday and f/u with you on Tuesday of next week. Per patient nothing needed prior to her appt. I did let her know that if any acute symptoms (chest pain, tightness, SOB, etc) needs to be evaluated over the weekend.

## 2023-08-18 NOTE — Telephone Encounter (Unsigned)
 Copied from CRM [id number]. Topic: Clinical - Medical Advice >> Aug 18, 2023  8:10 AM Martinique E wrote: Reason for CRM: Patient returned a call form Sueanne, relayed note that PCP put in chart and patient stated she does not have abdominal pain and she did not recognize the medication, voquenza. Patient also stated she started having heart palpitations this morning and did not not to speak with our nurse triage line, only Sueanne. Home phone number on file is good for a callback.

## 2023-08-22 ENCOUNTER — Ambulatory Visit (INDEPENDENT_AMBULATORY_CARE_PROVIDER_SITE_OTHER): Admitting: Internal Medicine

## 2023-08-22 VITALS — BP 126/72 | HR 80 | Resp 16 | Ht 63.0 in | Wt 127.0 lb

## 2023-08-22 DIAGNOSIS — I700 Atherosclerosis of aorta: Secondary | ICD-10-CM | POA: Diagnosis not present

## 2023-08-22 DIAGNOSIS — R1033 Periumbilical pain: Secondary | ICD-10-CM | POA: Diagnosis not present

## 2023-08-22 DIAGNOSIS — E559 Vitamin D deficiency, unspecified: Secondary | ICD-10-CM | POA: Diagnosis not present

## 2023-08-22 DIAGNOSIS — K219 Gastro-esophageal reflux disease without esophagitis: Secondary | ICD-10-CM

## 2023-08-22 DIAGNOSIS — K2270 Barrett's esophagus without dysplasia: Secondary | ICD-10-CM

## 2023-08-22 DIAGNOSIS — I10 Essential (primary) hypertension: Secondary | ICD-10-CM

## 2023-08-22 DIAGNOSIS — E7800 Pure hypercholesterolemia, unspecified: Secondary | ICD-10-CM

## 2023-08-22 DIAGNOSIS — N1831 Chronic kidney disease, stage 3a: Secondary | ICD-10-CM

## 2023-08-22 DIAGNOSIS — F439 Reaction to severe stress, unspecified: Secondary | ICD-10-CM

## 2023-08-22 DIAGNOSIS — R739 Hyperglycemia, unspecified: Secondary | ICD-10-CM | POA: Diagnosis not present

## 2023-08-22 DIAGNOSIS — E041 Nontoxic single thyroid nodule: Secondary | ICD-10-CM

## 2023-08-22 DIAGNOSIS — R002 Palpitations: Secondary | ICD-10-CM

## 2023-08-22 NOTE — Progress Notes (Signed)
 Subjective:    Patient ID: Carrie Noble, female    DOB: [birth year], 83 y.o.   MRN: [id number]  Patient here for  Chief Complaint  Patient presents with   Medical Management of Chronic Issues    HPI Here for a scheduled follow up.  She has been experiencing intermittent abdominal pain and burning sensation - persistent despite long term treatment for reflux. Recent diagnostic studies include an ultrasound on 2023/06/28, showed gallbladder sludge, indicating thick bile that acts like stones, potentially causing obstruction and pain. A HIDA scan on June 27, 2023, revealed hyperactive gallbladder function at 88%, contributing to her pain.  She has had various tests over the years, including endoscopies. Saw Dr Cesar 07/06/23 - recommended cholecystectomy.  Had f/u with GI 07/18/23 - recommended starting voquezna. Has not started yet. Plans to start. Saw cardiology 07/21/23 - metoprolol for palpitations. No palpitations last night. Breathing stable. Had questions regarding her gallbladder and surgery. Discussed. No bowel change reported.    Past Medical History:  Diagnosis Date   Allergy    Arthritis    Gastroesophageal reflux    Hiatal hernia 1989   status post Nissen fundoplication    Hx of hysterectomy    Hyperlipidemia    Tachycardia    Patient stated that this has been occuring frequently.   Thyroid  disease    Goiter   Past Surgical History:  Procedure Laterality Date   ABDOMINAL HYSTERECTOMY  1980   partial   blepheroplasty b/l eyes Bilateral    BREAST BIOPSY Right    neg   BREAST EXCISIONAL BIOPSY Left 2014   neg   COLONOSCOPY  2015   ESOPHAGOGASTRIC FUNDOPLICATION  1999   LAPAROSCOPIC NISSEN FUNDOPLICATION  1999   TONSILLECTOMY     as well as goiter   UPPER GI ENDOSCOPY  2015   Family History  Problem Relation Age of Onset   Stroke Mother 41   Arthritis Mother    Heart disease Mother    Hypertension Mother    Stroke Father 83   Hypertension Father     Rheumatic fever Brother        and multiple open heart surgeries   Diabetes Brother        type 2   Stroke Brother    Other Sister        Coronary Atherosclerosis   Stroke Brother    Stroke Sister    Heart disease Sister    Dementia Sister    Cancer Sister        ovarian   Breast cancer Neg Hx    Social History   Socioeconomic History   Marital status: Married    Spouse name: Not on file   Number of children: Not on file   Years of education: Not on file   Highest education level: Not on file  Occupational History   Occupation: Retired    Associate Professor: OTHER  Tobacco Use   Smoking status: Never   Smokeless tobacco: Never  Substance and Sexual Activity   Alcohol use: No    Alcohol/week: 0.0 standard drinks of alcohol   Drug use: No   Sexual activity: Never  Other Topics Concern   Not on file  Social History Narrative   Occasionally drinks coffee.   Social Drivers of Corporate investment banker Strain: Low Risk  (07/06/2023)   Received from [facility]   Overall Financial Resource Strain (CARDIA)    Difficulty of Paying  Living Expenses: Not hard at all  Food Insecurity: No Food Insecurity (07/06/2023)   Received from [facility]   Hunger Vital Sign    Within the past 12 months, you worried that your food would run out before you got the money to buy more.: Never true    Within the past 12 months, the food you bought just didn't last and you didn't have money to get more.: Never true  Transportation Needs: No Transportation Needs (07/06/2023)   Received from [facility] - Transportation    In the past 12 months, has lack of transportation kept you from medical appointments or from getting medications?: No    Lack of Transportation (Non-Medical): No  Physical Activity: Insufficiently Active (06/17/2021)   Exercise Vital Sign    Days of Exercise per Week: 7 days    Minutes of Exercise per Session: 20 min   Stress: No Stress Concern Present (06/17/2021)   [employer] of Occupational Health - Occupational Stress Questionnaire    Feeling of Stress : Not at all  Social Connections: Unknown (06/09/2020)   Social Connection and Isolation Panel    Frequency of Communication with Friends and Family: Not on file    Frequency of Social Gatherings with Friends and Family: Not on file    Attends Religious Services: Not on file    Active Member of Clubs or Organizations: Not on file    Attends Banker Meetings: Not on file    Marital Status: Married     Review of Systems  Constitutional:  Negative for fever and unexpected weight change.  HENT:  Negative for congestion and sinus pressure.   Respiratory:  Negative for cough, chest tightness and shortness of breath.   Cardiovascular:  Negative for chest pain and leg swelling.       Started on metoprolol for palpitations.   Gastrointestinal:  Negative for diarrhea and vomiting.       GI issues as outlined.   Genitourinary:  Negative for difficulty urinating and dysuria.  Musculoskeletal:  Negative for joint swelling and myalgias.  Skin:  Negative for color change and rash.  Neurological:  Negative for dizziness and headaches.  Psychiatric/Behavioral:  Negative for agitation and dysphoric mood.        Increased stress.        Objective:     BP 126/72   Pulse 80   Resp 16   Ht 5' 3 (1.6 m)   Wt 127 lb (57.6 kg)   SpO2 98%   BMI 22.50 kg/m  Wt Readings from Last 3 Encounters:  08/22/23 127 lb (57.6 kg)  07/18/23 126 lb 9.6 oz (57.4 kg)  06/13/23 128 lb 6.4 oz (58.2 kg)    Physical Exam Vitals reviewed.  Constitutional:      General: She is not in acute distress.    Appearance: Normal appearance.  HENT:     Head: Normocephalic and atraumatic.     Right Ear: External ear normal.     Left Ear: External ear normal.     Mouth/Throat:     Pharynx: No oropharyngeal exudate or posterior oropharyngeal erythema.  Eyes:      General: No scleral icterus.       Right eye: No discharge.        Left eye: No discharge.     Conjunctiva/sclera: Conjunctivae normal.  Neck:     Thyroid : No thyromegaly.  Cardiovascular:     Rate and  Rhythm: Normal rate and regular rhythm.  Pulmonary:     Effort: No respiratory distress.     Breath sounds: Normal breath sounds. No wheezing.  Abdominal:     General: Bowel sounds are normal.     Palpations: Abdomen is soft.     Comments: No rebound or guarding.   Musculoskeletal:        General: No swelling or tenderness.     Cervical back: Neck supple. No tenderness.  Lymphadenopathy:     Cervical: No cervical adenopathy.  Skin:    Findings: No erythema or rash.  Neurological:     Mental Status: She is alert.  Psychiatric:        Mood and Affect: Mood normal.        Behavior: Behavior normal.         Outpatient Encounter Medications as of 08/22/2023  Medication Sig   omeprazole  (PRILOSEC) 40 MG capsule Take 40 mg by mouth daily.   Biotin 1000 MCG tablet Take 1,000 mcg by mouth 3 (three) times daily. Reported on 05/13/2015   Cholecalciferol (VITAMIN D  PO) Take by mouth daily. Reported on 05/13/2015   estradiol  (ESTRACE  VAGINAL) 0.1 MG/GM vaginal cream Apply one applicator q hs x 5 nights and then one applicator 2x week.   Magnesium Oxide 250 MG TABS Take 1 tablet by mouth daily.   Melatonin 3-10 MG TABS Take 5 mg by mouth at bedtime as needed.   polyethylene glycol (MIRALAX ) 17 g packet Take 17 g by mouth daily.   sertraline  (ZOLOFT ) 25 MG tablet Take 1 tablet (25 mg total) by mouth daily.   Vibegron  (GEMTESA ) 75 MG TABS Take 1 tablet (75 mg total) by mouth daily.   vitamin B-12 (CYANOCOBALAMIN) 1000 MCG tablet Take 1,000 mcg by mouth daily. Reported on 05/13/2015   VOQUEZNA 10 MG TABS Take 1 tablet by mouth daily.   [DISCONTINUED] atorvastatin  (LIPITOR) 10 MG tablet One tablet q Monday, Wednesday and Friday.   [DISCONTINUED] ondansetron  (ZOFRAN ) 4 MG tablet Take 1  tablet (4 mg total) by mouth 2 (two) times daily as needed for nausea or vomiting.   [DISCONTINUED] pantoprazole  (PROTONIX ) 40 MG tablet Take 1 tablet (40 mg total) by mouth 2 (two) times daily before a meal.   Facility-Administered Encounter Medications as of 08/22/2023  Medication   betamethasone  acetate-betamethasone  sodium phosphate (CELESTONE ) injection 3 mg   betamethasone  acetate-betamethasone  sodium phosphate (CELESTONE ) injection 3 mg     Lab Results  Component Value Date   WBC 4.8 08/22/2023   HGB 12.8 08/22/2023   HCT 38.0 08/22/2023   PLT 348.0 08/22/2023   GLUCOSE 91 08/22/2023   CHOL 195 08/22/2023   TRIG 141.0 08/22/2023   HDL 60.00 08/22/2023   LDLDIRECT 141.0 09/01/2016   LDLCALC 107 (H) 08/22/2023   ALT 15 08/22/2023   AST 19 08/22/2023   NA 139 08/22/2023   K 4.8 08/22/2023   CL 101 08/22/2023   CREATININE 0.99 08/22/2023   BUN 13 08/22/2023   CO2 29 08/22/2023   TSH 0.49 08/22/2023   HGBA1C 5.7 08/22/2023    CT CORONARY MORPH W/CTA COR W/SCORE W/CA W/CM &/OR WO/CM Addendum Date: 07/27/2023 ADDENDUM REPORT: 07/27/2023 16:53 ADDENDUM: OVER-READ INTERPRETATION  CT CHEST The following report is an over-read performed by radiologist Dr. Toribio Faes of [facility]. This over-read does not include interpretation of cardiac or coronary anatomy or pathology; that interpretation by cardiologist is attached. No pleural or pericardial effusion. Small hiatal hernia. Visualized upper abdomen  unremarkable. No pneumothorax. Minimal linear scarring or atelectasis medially at the left lung base. Visualized lung fields otherwise clear. Regional bones unremarkable. IMPRESSION: 1. No acute findings. 2. Small hiatal hernia. Electronically Signed   By: JONETTA Faes M.D.   On: 07/27/2023 16:53   Result Date: 07/27/2023 CLINICAL DATA:  Chest pain EXAM: Cardiac/Coronary  CTA TECHNIQUE: The patient was scanned on a Siemens Somatom go.Top scanner. : A retrospective scan was  triggered in the ascending thoracic aorta. Axial non-contrast 3 mm slices were carried out through the heart. The data set was analyzed on a dedicated work station and scored using the Agatson method. Gantry rotation speed was 66 msecs and collimation was .6 mm. 25 mg of metoprolol and 0.8 mg of sl NTG was given. The 3D data set was reconstructed in 5% intervals of the 60-95 % of the R-R cycle. Diastolic phases were analyzed on a dedicated work station using MPR, MIP and VRT modes. The patient received 80 cc of contrast. FINDINGS: Aorta:  Normal size.  No calcifications.  No dissection. Aortic Valve:  Trileaflet.  No calcifications. Coronary Arteries:  Normal coronary origin.  Right dominance. RCA is a dominant artery. There is minimal calcified plaque in proximal RCA causing minimal stenosis (<25%). Left main gives rise to LAD and LCX arteries. LM has no disease. LAD has calcified plaque proximally causing mild stenosis (25-49%). LCX is a non-dominant artery.  There is no plaque. Other findings: Normal pulmonary vein drainage into the left atrium. Normal left atrial appendage without a thrombus. Normal size of the pulmonary artery. IMPRESSION: 1. Coronary calcium  score of 396. This was 70th percentile for age and sex matched control. 2. Normal coronary origin with right dominance. 3. Mild proximal LAD stenosis (25-49%). 4. Minimal RCA stenosis (<25%). 5. CAD-RADS 2. Mild non-obstructive CAD (25-49%). Consider non-atherosclerotic causes of chest pain. Consider preventive therapy and risk factor modification. Electronically Signed: By: Redell Cave M.D. On: 07/27/2023 15:09       Assessment & Plan:  Aortic atherosclerosis (HCC) Assessment & Plan: Continue lipitor.    Hypercholesterolemia Assessment & Plan: Continue statin medication. Low cholesterol diet and exercise. Follow lipid panel.   Orders: -     Basic metabolic panel with GFR -     Lipid panel -     Hepatic function panel -      TSH  Hyperglycemia Assessment & Plan: Follow met b and A1c.   Orders: -     Hemoglobin A1c  Vitamin D  deficiency  Periumbilical abdominal pain Assessment & Plan: She has long standing GERD s/p Nissen fundoplication which has been maintained on PPI therapy. She also has known Barrett's esophagus without dysplasia noted on EGD with biopsy. Saw GI 06/30/22.  Recommended to continue PPI - omeprazole , anti reflux measures. Recommended  EGD/colonoscopy in 2025.  Recommended benefiber and miralax . Previous CT scan - diverticulosis. Hiatal hernia.  No acute abnormality. She has continued to experience intermittent abdominal pain and burning sensation - persistent despite long term treatment for reflux. Recent diagnostic studies include an ultrasound on 06/30/23, showed gallbladder sludge, indicating thick bile that acts like stones, potentially causing obstruction and pain. A HIDA scan on June 27, 2023, revealed hyperactive gallbladder function at 88%, contributing to her pain.  She has had various tests over the years, including endoscopies. Saw Dr Cesar 07/06/23 - recommended cholecystectomy.  Had questions about surgery. Discussed. Plans to start voquenza.   Orders: -     CBC with Differential/Platelet  Barrett's esophagus without dysplasia Assessment & Plan:  Had f/u with GI 07/18/23 - recommended starting voquezna. Has not started yet. Plans to start.   Benign essential HTN Assessment & Plan: On no medication. Follow pressures.    Stage 3a chronic kidney disease (HCC) Assessment & Plan: Continue to avoid antiinflammatory medication. Discussed. Stay hydrated. Follow metabolic panel.    Gastroesophageal reflux disease, unspecified whether esophagitis present Assessment & Plan:  Had f/u with GI 07/18/23 - recommended starting voquezna. Has not started yet. Plans to start.   Thyroid  nodule Assessment & Plan: Saw Dr Cherilyn 06/10/21 - recommended f/u ultrasound in 2 years.  Due f/u  this year.     Stress Assessment & Plan: Increased stres. Discussed. Continue zoloft .    Palpitations Assessment & Plan: Saw cardiology 07/21/23. Recommended metoprolol. Follow.       Allena Hamilton, MD

## 2023-08-23 LAB — HEPATIC FUNCTION PANEL
ALT: 15 U/L (ref 0–35)
AST: 19 U/L (ref 0–37)
Albumin: 4.5 g/dL (ref 3.5–5.2)
Alkaline Phosphatase: 97 U/L (ref 39–117)
Bilirubin, Direct: 0.1 mg/dL (ref 0.0–0.3)
Total Bilirubin: 0.5 mg/dL (ref 0.2–1.2)
Total Protein: 7 g/dL (ref 6.0–8.3)

## 2023-08-23 LAB — BASIC METABOLIC PANEL WITH GFR
BUN: 13 mg/dL (ref 6–23)
CO2: 29 meq/L (ref 19–32)
Calcium: 9.6 mg/dL (ref 8.4–10.5)
Chloride: 101 meq/L (ref 96–112)
Creatinine, Ser: 0.99 mg/dL (ref 0.40–1.20)
GFR: 52.44 mL/min — ABNORMAL LOW (ref >60.00)
Glucose, Bld: 91 mg/dL (ref 70–99)
Potassium: 4.8 meq/L (ref 3.5–5.1)
Sodium: 139 meq/L (ref 135–145)

## 2023-08-23 LAB — LIPID PANEL
Cholesterol: 195 mg/dL (ref 0–200)
HDL: 60 mg/dL (ref >39.00)
LDL Cholesterol: 107 mg/dL — ABNORMAL HIGH (ref 0–99)
NonHDL: 135.4
Total CHOL/HDL Ratio: 3
Triglycerides: 141 mg/dL (ref 0.0–149.0)
VLDL: 28.2 mg/dL (ref 0.0–40.0)

## 2023-08-23 LAB — CBC WITH DIFFERENTIAL/PLATELET
Basophils Absolute: 0 K/uL (ref 0.0–0.1)
Basophils Relative: 0.8 % (ref 0.0–3.0)
Eosinophils Absolute: 0.2 K/uL (ref 0.0–0.7)
Eosinophils Relative: 3.2 % (ref 0.0–5.0)
HCT: 38 % (ref 36.0–46.0)
Hemoglobin: 12.8 g/dL (ref 12.0–15.0)
Lymphocytes Relative: 44.8 % (ref 12.0–46.0)
Lymphs Abs: 2.1 K/uL (ref 0.7–4.0)
MCHC: 33.6 g/dL (ref 30.0–36.0)
MCV: 93.6 fl (ref 78.0–100.0)
Monocytes Absolute: 0.4 K/uL (ref 0.1–1.0)
Monocytes Relative: 9 % (ref 3.0–12.0)
Neutro Abs: 2 K/uL (ref 1.4–7.7)
Neutrophils Relative %: 42.2 % — ABNORMAL LOW (ref 43.0–77.0)
Platelets: 348 K/uL (ref 150.0–400.0)
RBC: 4.07 Mil/uL (ref 3.87–5.11)
RDW: 12.7 % (ref 11.5–15.5)
WBC: 4.8 K/uL (ref 4.0–10.5)

## 2023-08-23 LAB — HEMOGLOBIN A1C: Hgb A1c MFr Bld: 5.7 % (ref 4.6–6.5)

## 2023-08-23 LAB — TSH: TSH: 0.49 u[IU]/mL (ref 0.35–5.50)

## 2023-08-24 ENCOUNTER — Telehealth: Payer: Self-pay

## 2023-08-24 ENCOUNTER — Ambulatory Visit: Payer: Self-pay | Admitting: Internal Medicine

## 2023-08-24 MED ORDER — ATORVASTATIN CALCIUM 10 MG PO TABS: 10.0000 mg | ORAL_TABLET | ORAL | 1 refills | Status: DC

## 2023-08-24 NOTE — Telephone Encounter (Signed)
 Spoke with patient. Advised I do not schedule surgery. Provided her with the number to Dr Chuck office and advised to let us  know once surgery has been scheduled.

## 2023-08-24 NOTE — Telephone Encounter (Signed)
 LM for patient

## 2023-08-24 NOTE — Telephone Encounter (Signed)
 Copied from CRM [id number]. Topic: Clinical - Medication Question >> Aug 24, 2023  8:59 AM Chasity T wrote: Reason for CRM: Patient is calling in to speak with Sueanne regarding if she wants to get her gallbaddler taken out or not. She has decided she is going to make the appointment to do so. Please contact her back to discuss.

## 2023-08-25 NOTE — Telephone Encounter (Signed)
 Patient was calling to see if I had heard anything else about scheduling surgery. Advised patient that she needs to contact Dr Chuck office to set up surgery.

## 2023-08-25 NOTE — Telephone Encounter (Signed)
 Nancee Darice GORMAN JERRYL   08/25/23  8:35 AM Note Copied from CRM [id number]. Topic: General - Other >> Aug 25, 2023  8:28 AM Deaijah H wrote: Reason for CRM: Patient would like to return trish call and let Sueanne know she may leave a message on [phone]

## 2023-08-25 NOTE — Telephone Encounter (Signed)
 Copied from CRM [id number]. Topic: General - Other >> Aug 25, 2023  8:28 AM Deaijah H wrote: Reason for CRM: Patient would like to return trish call and let Sueanne know she may leave a message on [phone]

## 2023-08-27 ENCOUNTER — Encounter: Payer: Self-pay | Admitting: Internal Medicine

## 2023-08-27 NOTE — Assessment & Plan Note (Signed)
 Continue to avoid antiinflammatory medication. Discussed. Stay hydrated. Follow metabolic panel.

## 2023-08-27 NOTE — Assessment & Plan Note (Signed)
 Increased stres. Discussed. Continue zoloft .

## 2023-08-27 NOTE — Assessment & Plan Note (Signed)
 Continue statin medication. Low cholesterol diet and exercise. Follow lipid panel.

## 2023-08-27 NOTE — Assessment & Plan Note (Signed)
 Had f/u with GI 07/18/23 - recommended starting voquezna. Has not started yet. Plans to start.

## 2023-08-27 NOTE — Assessment & Plan Note (Signed)
 Saw Dr Gershon Crane 06/10/21 - recommended f/u ultrasound in 2 years.  Due f/u this year.

## 2023-08-27 NOTE — Assessment & Plan Note (Signed)
 She has long standing GERD s/p Nissen fundoplication which has been maintained on PPI therapy. She also has known Barrett's esophagus without dysplasia noted on EGD with biopsy. Saw GI 06/30/22.  Recommended to continue PPI - omeprazole , anti reflux measures. Recommended  EGD/colonoscopy in 2025.  Recommended benefiber and miralax . Previous CT scan - diverticulosis. Hiatal hernia.  No acute abnormality. She has continued to experience intermittent abdominal pain and burning sensation - persistent despite long term treatment for reflux. Recent diagnostic studies include an ultrasound on 2023-07-21, showed gallbladder sludge, indicating thick bile that acts like stones, potentially causing obstruction and pain. A HIDA scan on June 27, 2023, revealed hyperactive gallbladder function at 88%, contributing to her pain.  She has had various tests over the years, including endoscopies. Saw Dr Cesar 07/06/23 - recommended cholecystectomy.  Had questions about surgery. Discussed. Plans to start voquenza.

## 2023-08-27 NOTE — Assessment & Plan Note (Signed)
 Continue lipitor  ?

## 2023-08-27 NOTE — Assessment & Plan Note (Signed)
 Follow met b and A1c.

## 2023-08-27 NOTE — Assessment & Plan Note (Signed)
 Saw cardiology 07/21/23. Recommended metoprolol. Follow.

## 2023-08-27 NOTE — Assessment & Plan Note (Signed)
On no medication.  Follow pressures.   ?

## 2023-08-31 ENCOUNTER — Ambulatory Visit: Payer: Self-pay | Admitting: General Surgery

## 2023-08-31 NOTE — H&P (View-Only) (Signed)
 History of Present Illness Carrie Noble is an 83 year old female who presents with persistent abdominal pain and gallbladder issues. She is accompanied by her husband. She was referred by Dr. Allena Hamilton for evaluation of gallbladder issues.  She experiences persistent abdominal pain and a burning sensation primarily at night, which has not improved with treatment for reflux. The pain is described as burning, located under her ribs and across her abdomen, and is exacerbated when lying down, particularly at night, and occasionally after eating greasy and meaty foods.  An abdominal ultrasound on Jun 21, 2023, showed sludge in the gallbladder. A HIDA scan on June 27, 2023, revealed a gallbladder ejection fraction of 88%.  She has been taking Protonix  and pantoprazole  for reflux, but there is no significant improvement in her symptoms. She also experiences nausea and hot flashes, particularly at night, and has a decreased appetite.       PAST MEDICAL HISTORY:  Past Medical History:  Diagnosis Date  . Allergic state   . Barrett's esophagus 2012  . GERD (gastroesophageal reflux disease)   . Goiter    Dr Milissa   . Hyperlipidemia   . Irritable bowel syndrome   . Osteoarthritis   . Osteoporosis, post-menopausal 2009   no treatment        PAST SURGICAL HISTORY:   Past Surgical History:  Procedure Laterality Date  . TONSILLECTOMY  1960  . TUBAL LIGATION  1974  . HYSTERECTOMY  1980   has ovaries-done for fibroids  . REPAIR PARAESOPHAGEAL HIATAL HERNIA FUNDOPLICATION W/MESH/PROSTHESIS  1999  . PERCUTANEOUS BIOPSY BREAST Left 2010   Dr Winfield  . EGD  01/2011   Dr Gaylyn  . COLONOSCOPY  03/15/2013   Repeat 03/16/2023  . Upper endoscopy  03/15/2013   repeat 03/15/2016  . ESOPHAGOGASTRODOUDENOSCOPY W/BIOPSY  11/11/2016   Procedure: ESOPHAGOGASTRODUODENOSCOPY, FLEXIBLE, TRANSORAL; WITH BIOPSY, SINGLE OR MULTIPLE;  Surgeon: Charlean, Toribio Elbe, MD;  Location: [facility] ENDO/[facility];   Service: Gastroenterology;;  . CATARACT EXTRACTION  06/30/2020   1st surgery 06/30/2020/ 2nd surgery 07/20/2020  . EYELID SURGERY  2023  . COLONOSCOPY    . Nissen fundoplication    . UPPER GASTROINTESTINAL ENDOSCOPY           MEDICATIONS:  Outpatient Encounter Medications as of 08/31/2023  Medication Sig Dispense Refill  . acetaminophen  (TYLENOL  8 HOUR ORAL) Take by mouth as needed    . atorvastatin  (LIPITOR) 10 MG tablet Take 1 tablet (10 mg total) by mouth once daily 90 tablet 0  . biotin 1 mg Cap Take 1 capsule by mouth once daily       . CALCIUM  ACETATE ORAL Take 1 tablet by mouth once daily    . cholecalciferol (VITAMIN D3) 1,000 unit capsule Take 1,000 Units by mouth once daily    . cyanocobalamin (VITAMIN B12) 1000 MCG tablet Take 1,000 mcg by mouth once daily       . estradioL  (ESTRACE ) 0.01 % (0.1 mg/gram) vaginal cream Apply one applicator q hs x 5 nights and then one applicator 2x week.    . GEMTESA  75 mg Tab Take 75 mg by mouth once daily    . magnesium oxide 250 mg magnesium Tab Take 1 tablet by mouth once daily    . melatonin-pyridoxine HCl, B6, 3-10 mg Tab Take 5 mg by mouth at bedtime as needed    . meloxicam (MOBIC) 15 MG tablet Take 15 mg by mouth daily with dinner    . metoprolol TARTrate (LOPRESSOR) 25 MG tablet  Take 2 doses (50 mg total) by mouth at bedtime the night before your exam and take 2 doses (50 mg total) by mouth the morning of your heart CT exam. 4 tablet 0  . pantoprazole  (PROTONIX ) 40 MG DR tablet Take 40 mg by mouth once daily    . peg 400-propylene glycol, PF, (SYSTANE ULTRA) 0.4-0.3 % ophthalmic drops Place 1 drop into both eyes as needed for Dry Eyes    . sertraline  (ZOLOFT ) 25 MG tablet Take 25 mg by mouth once daily    . vonoprazan (VOQUEZNA) 10 mg Tab Take 10 mg by mouth once daily for 180 days 30 tablet 6   No facility-administered encounter medications on file as of 08/31/2023.     ALLERGIES:   Carafate [sucralfate], Darvocet a500 [propoxyphene  n-acetaminophen ], Morphine, and Other   SOCIAL HISTORY:  Social History   Socioeconomic History  . Marital status: Married  Occupational History  . Occupation: Retired  Tobacco Use  . Smoking status: Never  . Smokeless tobacco: Never  Vaping Use  . Vaping status: Never Used  Substance and Sexual Activity  . Alcohol use: No  . Sexual activity: Yes   Social Drivers of Corporate investment banker Strain: Low Risk  (07/06/2023)   Overall Financial Resource Strain (CARDIA)   . Difficulty of Paying Living Expenses: Not hard at all  Food Insecurity: No Food Insecurity (07/06/2023)   Hunger Vital Sign   . Worried About Programme researcher, broadcasting/film/video in the Last Year: Never true   . Ran Out of Food in the Last Year: Never true  Transportation Needs: No Transportation Needs (07/06/2023)   PRAPARE - Transportation   . Lack of Transportation (Medical): No   . Lack of Transportation (Non-Medical): No    FAMILY HISTORY:  Family History  Problem Relation Name Age of Onset  . High blood pressure (Hypertension) Mother    . Coronary Artery Disease (Blocked arteries around heart) Mother    . Osteoarthritis Mother    . High blood pressure (Hypertension) Father    . Coronary Artery Disease (Blocked arteries around heart) Father    . Ovarian cancer Sister  8  . Osteoporosis (Thinning of bones) Sister    . Diabetes type II Brother    . Diabetes type II Brother    . Alzheimer's disease Brother    . Coronary Artery Disease (Blocked arteries around heart) Sister    . High blood pressure (Hypertension) Sister    . Coronary Artery Disease (Blocked arteries around heart) Sister    . Coronary Artery Disease (Blocked arteries around heart) Sister    . Other Sister         seizure, lives in SNF     GENERAL REVIEW OF SYSTEMS:   General ROS: negative for - chills, fatigue, fever, weight gain or weight loss Allergy and Immunology ROS: negative for - hives  Hematological and Lymphatic ROS: negative for -  bleeding problems or bruising, negative for palpable nodes Endocrine ROS: negative for - heat or cold intolerance, hair changes Respiratory ROS: negative for - cough, shortness of breath or wheezing Cardiovascular ROS: no chest pain or palpitations GI ROS: Positive for nausea, abdominal pain Musculoskeletal ROS: negative for - joint swelling or muscle pain Neurological ROS: negative for - confusion, syncope Dermatological ROS: negative for pruritus and rash  PHYSICAL EXAM:  Vitals:   08/31/23 1015  BP: (!) 146/78  Pulse: 78  .  Ht:161.3 cm (5' 3.5) Wt:57.6 kg (127 lb)  ADJ:Anib surface area is 1.61 meters squared. Body mass index is 22.14 kg/m.SABRA   GENERAL: Alert, active, oriented x3  HEENT: Pupils equal reactive to light. Extraocular movements are intact. Sclera clear. Palpebral conjunctiva normal red color.Pharynx clear.  NECK: Supple with no palpable mass and no adenopathy.  LUNGS: Sound clear with no rales rhonchi or wheezes.  HEART: Regular rhythm S1 and S2 without murmur.  ABDOMEN: Soft and depressible, nontender with no palpable mass, no hepatomegaly.   EXTREMITIES: Well-developed well-nourished symmetrical with no dependent edema.  NEUROLOGICAL: Awake alert oriented, facial expression symmetrical, moving all extremities.   Results RADIOLOGY Abdominal ultrasound: Gallbladder sludge (06/21/2023)  DIAGNOSTIC HIDA scan: Gallbladder ejection fraction 88% (06/27/2023)    Assessment & Plan Cholecystitis with gallbladder sludge and hyperkinetic gallbladder   She experiences persistent abdominal pain and burning, especially at night, despite reflux treatment. An ultrasound on Jun 21, 2023, revealed gallbladder sludge. A HIDA scan on June 27, 2023, showed a hyperkinetic gallbladder with an ejection fraction of 88%, much higher than the normal 30-40%. Her symptoms are atypical, more pronounced on the left side. The high ejection fraction and sludge can mimic gallstone symptoms.   She was evaluated by cardiology and reevaluated by gastroenterology and no other etiologies of her abdominal pain has been found.  She has not responded to medication for her reflux.  She is prepared for surgical intervention to prevent symptom worsening and complications. Surgery is supported by her cardiologist's confirmation of no significant cardiac issues. Schedule a robotic assisted laparoscopic cholecystectomy, using four small incisions in the mid-abdomen. Ensure she has someone to accompany her home post-surgery and provide post-operative instructions and care plan.   Biliary sludge [K83.8]          Patient verbalized understanding, all questions were answered, and were agreeable with the plan outlined above.   [name], MD  Electronically signed by [name], MD

## 2023-09-04 ENCOUNTER — Ambulatory Visit: Admitting: Anesthesiology

## 2023-09-04 ENCOUNTER — Encounter: Payer: Self-pay | Admitting: General Surgery

## 2023-09-04 ENCOUNTER — Ambulatory Visit
Admission: RE | Admit: 2023-09-04 | Discharge: 2023-09-04 | Disposition: A | Discharge status: Home / Self Care | Attending: General Surgery | Admitting: General Surgery

## 2023-09-04 ENCOUNTER — Encounter
Admission: RE | Disposition: A | Discharge status: Home / Self Care | Payer: Self-pay | Source: Home / Self Care | Attending: General Surgery

## 2023-09-04 ENCOUNTER — Other Ambulatory Visit: Payer: Self-pay

## 2023-09-04 DIAGNOSIS — K219 Gastro-esophageal reflux disease without esophagitis: Secondary | ICD-10-CM | POA: Diagnosis not present

## 2023-09-04 DIAGNOSIS — I10 Essential (primary) hypertension: Secondary | ICD-10-CM | POA: Diagnosis not present

## 2023-09-04 DIAGNOSIS — K8010 Calculus of gallbladder with chronic cholecystitis without obstruction: Secondary | ICD-10-CM | POA: Diagnosis present

## 2023-09-04 DIAGNOSIS — K449 Diaphragmatic hernia without obstruction or gangrene: Secondary | ICD-10-CM | POA: Insufficient documentation

## 2023-09-04 DIAGNOSIS — Z79899 Other long term (current) drug therapy: Secondary | ICD-10-CM | POA: Diagnosis not present

## 2023-09-04 SURGERY — CHOLECYSTECTOMY, ROBOT-ASSISTED, LAPAROSCOPIC
Anesthesia: General | Site: Abdomen

## 2023-09-04 MED ORDER — FENTANYL CITRATE (PF) 100 MCG/2ML IJ SOLN
25.0000 ug | INTRAMUSCULAR | Status: DC | PRN
  Administered 2023-09-04: 25 ug via INTRAVENOUS
  Administered 2023-09-04: 50 ug via INTRAVENOUS
  Administered 2023-09-04 (×2): 25 ug via INTRAVENOUS
  Administered 2023-09-04 (×3): 50 ug via INTRAVENOUS
  Administered 2023-09-04: 25 ug via INTRAVENOUS

## 2023-09-04 MED ORDER — LABETALOL HCL 5 MG/ML IV SOLN
INTRAVENOUS | Status: AC
  Filled 2023-09-04: qty 4

## 2023-09-04 MED ORDER — ROCURONIUM BROMIDE 100 MG/10ML IV SOLN
INTRAVENOUS | Status: DC | PRN
  Administered 2023-09-04 (×2): 60 mg via INTRAVENOUS

## 2023-09-04 MED ORDER — PROPOFOL 500 MG/50ML IV EMUL
INTRAVENOUS | Status: DC | PRN
  Administered 2023-09-04 (×2): 125 ug/kg/min via INTRAVENOUS

## 2023-09-04 MED ORDER — DROPERIDOL 2.5 MG/ML IJ SOLN
0.6250 mg | Freq: Once | INTRAMUSCULAR | Status: AC | PRN
  Administered 2023-09-04 (×2): 0.625 mg via INTRAVENOUS

## 2023-09-04 MED ORDER — OXYCODONE HCL 5 MG/5ML PO SOLN: 5.0000 mg | Freq: Once | ORAL | Status: AC | PRN

## 2023-09-04 MED ORDER — INDOCYANINE GREEN 25 MG IV SOLR
INTRAVENOUS | Status: AC
  Filled 2023-09-04: qty 10

## 2023-09-04 MED ORDER — LIDOCAINE HCL (CARDIAC) PF 100 MG/5ML IV SOSY
PREFILLED_SYRINGE | INTRAVENOUS | Status: DC | PRN
  Administered 2023-09-04 (×2): 60 mg via INTRAVENOUS

## 2023-09-04 MED ORDER — CHLORHEXIDINE GLUCONATE 0.12 % MT SOLN
OROMUCOSAL | Status: AC
  Filled 2023-09-04: qty 15

## 2023-09-04 MED ORDER — SUGAMMADEX SODIUM 200 MG/2ML IV SOLN
INTRAVENOUS | Status: DC | PRN
  Administered 2023-09-04 (×2): 200 mg via INTRAVENOUS

## 2023-09-04 MED ORDER — PROPOFOL 10 MG/ML IV BOLUS
INTRAVENOUS | Status: AC
  Filled 2023-09-04: qty 20

## 2023-09-04 MED ORDER — DEXAMETHASONE SODIUM PHOSPHATE 10 MG/ML IJ SOLN
INTRAMUSCULAR | Status: DC | PRN
  Administered 2023-09-04 (×2): 5 mg via INTRAVENOUS

## 2023-09-04 MED ORDER — CHLORHEXIDINE GLUCONATE 0.12 % MT SOLN
15.0000 mL | Freq: Once | OROMUCOSAL | Status: AC
  Administered 2023-09-04 (×2): 15 mL via OROMUCOSAL

## 2023-09-04 MED ORDER — TRAMADOL HCL 50 MG PO TABS: 50.0000 mg | ORAL_TABLET | Freq: Four times a day (QID) | ORAL | 0 refills | Status: DC | PRN

## 2023-09-04 MED ORDER — ROCURONIUM BROMIDE 10 MG/ML (PF) SYRINGE
PREFILLED_SYRINGE | INTRAVENOUS | Status: AC
  Filled 2023-09-04: qty 10

## 2023-09-04 MED ORDER — OXYCODONE HCL 5 MG PO TABS
ORAL_TABLET | ORAL | Status: AC
  Filled 2023-09-04: qty 1

## 2023-09-04 MED ORDER — BUPIVACAINE-EPINEPHRINE 0.25% -1:200000 IJ SOLN
INTRAMUSCULAR | Status: DC | PRN
  Administered 2023-09-04 (×2): 30 mL

## 2023-09-04 MED ORDER — ACETAMINOPHEN 10 MG/ML IV SOLN: 1000.0000 mg | Freq: Once | INTRAVENOUS | Status: DC | PRN

## 2023-09-04 MED ORDER — FENTANYL CITRATE (PF) 100 MCG/2ML IJ SOLN
INTRAMUSCULAR | Status: AC
  Filled 2023-09-04: qty 2

## 2023-09-04 MED ORDER — INDOCYANINE GREEN 25 MG IV SOLR
1.2500 mg | Freq: Once | INTRAVENOUS | Status: AC
  Administered 2023-09-04 (×2): 1.25 mg via INTRAVENOUS

## 2023-09-04 MED ORDER — CEFAZOLIN SODIUM-DEXTROSE 2-4 GM/100ML-% IV SOLN
INTRAVENOUS | Status: AC
  Filled 2023-09-04: qty 100

## 2023-09-04 MED ORDER — ORAL CARE MOUTH RINSE: 15.0000 mL | Freq: Once | OROMUCOSAL | Status: AC

## 2023-09-04 MED ORDER — ACETAMINOPHEN 10 MG/ML IV SOLN
INTRAVENOUS | Status: DC | PRN
  Administered 2023-09-04 (×2): 1000 mg via INTRAVENOUS

## 2023-09-04 MED ORDER — PHENYLEPHRINE 80 MCG/ML (10ML) SYRINGE FOR IV PUSH (FOR BLOOD PRESSURE SUPPORT)
PREFILLED_SYRINGE | INTRAVENOUS | Status: DC | PRN
  Administered 2023-09-04 (×2): 80 ug via INTRAVENOUS

## 2023-09-04 MED ORDER — PHENYLEPHRINE 80 MCG/ML (10ML) SYRINGE FOR IV PUSH (FOR BLOOD PRESSURE SUPPORT)
PREFILLED_SYRINGE | INTRAVENOUS | Status: AC
  Filled 2023-09-04: qty 10

## 2023-09-04 MED ORDER — OXYCODONE HCL 5 MG PO TABS
5.0000 mg | ORAL_TABLET | Freq: Once | ORAL | Status: AC | PRN
  Administered 2023-09-04 (×2): 5 mg via ORAL

## 2023-09-04 MED ORDER — CEFAZOLIN SODIUM-DEXTROSE 2-4 GM/100ML-% IV SOLN
2.0000 g | INTRAVENOUS | Status: AC
  Administered 2023-09-04 (×2): 2 g via INTRAVENOUS

## 2023-09-04 MED ORDER — PROPOFOL 10 MG/ML IV BOLUS
INTRAVENOUS | Status: DC | PRN
  Administered 2023-09-04 (×2): 80 mg via INTRAVENOUS

## 2023-09-04 MED ORDER — LACTATED RINGERS IV SOLN: INTRAVENOUS | Status: DC

## 2023-09-04 MED ORDER — DROPERIDOL 2.5 MG/ML IJ SOLN
INTRAMUSCULAR | Status: AC
  Filled 2023-09-04: qty 2

## 2023-09-04 MED ORDER — PROPOFOL 1000 MG/100ML IV EMUL
INTRAVENOUS | Status: AC
  Filled 2023-09-04: qty 100

## 2023-09-04 MED ORDER — ACETAMINOPHEN 10 MG/ML IV SOLN
INTRAVENOUS | Status: AC
  Filled 2023-09-04: qty 100

## 2023-09-04 MED ORDER — ONDANSETRON HCL 4 MG/2ML IJ SOLN
INTRAMUSCULAR | Status: AC
  Filled 2023-09-04: qty 2

## 2023-09-04 MED ORDER — FENTANYL CITRATE (PF) 100 MCG/2ML IJ SOLN
INTRAMUSCULAR | Status: AC
Start: 2023-09-04 — End: 2023-09-04
  Filled 2023-09-04: qty 2

## 2023-09-04 MED ORDER — 0.9 % SODIUM CHLORIDE (POUR BTL) OPTIME
TOPICAL | Status: DC | PRN
Start: 2023-09-04 — End: 2023-09-04
  Administered 2023-09-04 (×2): 500 mL

## 2023-09-04 MED ORDER — LIDOCAINE HCL (PF) 2 % IJ SOLN
INTRAMUSCULAR | Status: AC
Start: 2023-09-04 — End: 2023-09-04
  Filled 2023-09-04: qty 5

## 2023-09-04 MED ORDER — BUPIVACAINE-EPINEPHRINE (PF) 0.25% -1:200000 IJ SOLN
INTRAMUSCULAR | Status: AC
  Filled 2023-09-04: qty 30

## 2023-09-04 MED ORDER — FENTANYL CITRATE (PF) 100 MCG/2ML IJ SOLN
INTRAMUSCULAR | Status: DC | PRN
  Administered 2023-09-04 (×4): 50 ug via INTRAVENOUS

## 2023-09-04 MED ORDER — DEXAMETHASONE SODIUM PHOSPHATE 10 MG/ML IJ SOLN
INTRAMUSCULAR | Status: AC
  Filled 2023-09-04: qty 1

## 2023-09-04 MED ORDER — ONDANSETRON HCL 4 MG/2ML IJ SOLN
INTRAMUSCULAR | Status: DC | PRN
  Administered 2023-09-04 (×2): 4 mg via INTRAVENOUS

## 2023-09-04 SURGICAL SUPPLY — 43 items
BAG PRESSURE INF REUSE 1000 (BAG) IMPLANT
CANNULA REDUCER 12-8 DVNC XI (CANNULA) ×2 IMPLANT
CATH REDDICK CHOLANGI 4FR 50CM (CATHETERS) IMPLANT
CAUTERY HOOK MNPLR 1.6 DVNC XI (INSTRUMENTS) ×2 IMPLANT
CLIP LIGATING HEM O LOK PURPLE (MISCELLANEOUS) IMPLANT
CLIP LIGATING HEMO O LOK GREEN (MISCELLANEOUS) ×2 IMPLANT
DEFOGGER SCOPE WARM SEASHARP (MISCELLANEOUS) ×2 IMPLANT
DERMABOND ADVANCED .7 DNX12 (GAUZE/BANDAGES/DRESSINGS) ×2 IMPLANT
DRAPE ARM DVNC X/XI (DISPOSABLE) ×8 IMPLANT
DRAPE C-ARM XRAY 36X54 (DRAPES) IMPLANT
DRAPE COLUMN DVNC XI (DISPOSABLE) ×2 IMPLANT
ELECTRODE REM PT RTRN 9FT ADLT (ELECTROSURGICAL) ×2 IMPLANT
FORCEPS BPLR 8 MD DVNC XI (FORCEP) IMPLANT
FORCEPS BPLR FENES DVNC XI (FORCEP) ×2 IMPLANT
FORCEPS PROGRASP DVNC XI (FORCEP) ×2 IMPLANT
GLOVE BIO SURGEON STRL SZ 6.5 (GLOVE) ×4 IMPLANT
GLOVE BIOGEL PI IND STRL 6.5 (GLOVE) ×4 IMPLANT
GLOVE SURG SYN 6.5 PF PI (GLOVE) ×4 IMPLANT
GOWN STRL REUS W/ TWL LRG LVL3 (GOWN DISPOSABLE) ×8 IMPLANT
GRASPER SUT TROCAR 14GX15 (MISCELLANEOUS) ×2 IMPLANT
IRRIGATOR SUCT 8 DISP DVNC XI (IRRIGATION / IRRIGATOR) IMPLANT
IV CATH ANGIO 12GX3 LT BLUE (NEEDLE) IMPLANT
IV NS 1000ML BAXH (IV SOLUTION) IMPLANT
KIT PINK PAD W/HEAD ARM REST (MISCELLANEOUS) ×2 IMPLANT
LABEL OR SOLS (LABEL) ×2 IMPLANT
MANIFOLD NEPTUNE II (INSTRUMENTS) IMPLANT
NDL HYPO 22X1.5 SAFETY MO (MISCELLANEOUS) ×2 IMPLANT
NDL INSUFFLATION 14GA 120MM (NEEDLE) ×2 IMPLANT
NEEDLE HYPO 22X1.5 SAFETY MO (MISCELLANEOUS) ×1 IMPLANT
NEEDLE INSUFFLATION 14GA 120MM (NEEDLE) ×1 IMPLANT
NS IRRIG 500ML POUR BTL (IV SOLUTION) ×2 IMPLANT
OBTURATOR OPTICALSTD 8 DVNC (TROCAR) ×2 IMPLANT
PACK LAP CHOLECYSTECTOMY (MISCELLANEOUS) ×2 IMPLANT
SEAL UNIV 5-12 XI (MISCELLANEOUS) ×8 IMPLANT
SET TUBE SMOKE EVAC HIGH FLOW (TUBING) ×2 IMPLANT
SOLUTION ELECTROSURG ANTI STCK (MISCELLANEOUS) ×2 IMPLANT
SPIKE FLUID TRANSFER (MISCELLANEOUS) ×4 IMPLANT
SPONGE T-LAP 4X18 ~~LOC~~+RFID (SPONGE) IMPLANT
SUT VICRYL 0 UR6 27IN ABS (SUTURE) ×2 IMPLANT
SUTURE MNCRL 4-0 27XMF (SUTURE) ×2 IMPLANT
SYSTEM BAG RETRIEVAL 10MM (BASKET) ×2 IMPLANT
TRAP FLUID SMOKE EVACUATOR (MISCELLANEOUS) IMPLANT
WATER STERILE IRR 500ML POUR (IV SOLUTION) ×2 IMPLANT

## 2023-09-04 NOTE — Anesthesia Preprocedure Evaluation (Addendum)
 Anesthesia Evaluation  Patient identified by MRN, date of birth, ID band Patient awake    Reviewed: Allergy & Precautions, H&P , NPO status , Patient's Chart, lab work & pertinent test results  Airway Mallampati: II  TM Distance: >3 FB Neck ROM: full    Dental  (+) Implants   Pulmonary neg pulmonary ROS   Pulmonary exam normal        Cardiovascular hypertension, Normal cardiovascular exam     Neuro/Psych negative neurological ROS  negative psych ROS   GI/Hepatic Neg liver ROS, hiatal hernia,GERD  ,,  Endo/Other  negative endocrine ROS    Renal/GU Renal InsufficiencyRenal disease     Musculoskeletal  (+) Arthritis ,    Abdominal Normal abdominal exam  (+)   Peds  Hematology negative hematology ROS (+)   Anesthesia Other Findings Past Medical History: No date: Allergy No date: Arthritis No date: Gastroesophageal reflux 1989: Hiatal hernia     Comment:  status post Nissen fundoplication  No date: Hx of hysterectomy No date: Hyperlipidemia No date: Tachycardia     Comment:  Patient stated that this has been occuring frequently. No date: Thyroid  disease     Comment:  Goiter  Past Surgical History: 1980: ABDOMINAL HYSTERECTOMY     Comment:  partial No date: blepheroplasty b/l eyes; Bilateral No date: BREAST BIOPSY; Right     Comment:  neg 2014: BREAST EXCISIONAL BIOPSY; Left     Comment:  neg 2015: COLONOSCOPY 1999: ESOPHAGOGASTRIC FUNDOPLICATION 1999: LAPAROSCOPIC NISSEN FUNDOPLICATION No date: TONSILLECTOMY     Comment:  as well as goiter 2015: UPPER GI ENDOSCOPY     Reproductive/Obstetrics negative OB ROS                              Anesthesia Physical Anesthesia Plan  ASA: 2  Anesthesia Plan: General ETT   Post-op Pain Management: Toradol IV (intra-op)* and Ofirmev  IV (intra-op)*   Induction: Intravenous  PONV Risk Score and Plan: 2 and Ondansetron  and  Dexamethasone   Airway Management Planned: Oral ETT  Additional Equipment:   Intra-op Plan:   Post-operative Plan: Extubation in OR  Informed Consent: I have reviewed the patients History and Physical, chart, labs and discussed the procedure including the risks, benefits and alternatives for the proposed anesthesia with the patient or authorized representative who has indicated his/her understanding and acceptance.     Dental Advisory Given  Plan Discussed with: CRNA and Surgeon  Anesthesia Plan Comments:          Anesthesia Quick Evaluation

## 2023-09-04 NOTE — Discharge Instructions (Addendum)

## 2023-09-04 NOTE — Anesthesia Procedure Notes (Signed)
 Procedure Name: Intubation Date/Time: 09/04/2023 1:03 PM  Performed by: Brien Sotero PARAS, CRNAPre-anesthesia Checklist: Patient identified, Emergency Drugs available, Suction available and Patient being monitored Patient Re-evaluated:Patient Re-evaluated prior to induction Oxygen Delivery Method: Circle system utilized Preoxygenation: Pre-oxygenation with 100% oxygen Induction Type: IV induction and Rapid sequence Laryngoscope Size: McGrath and 3 Grade View: Grade I Tube type: Oral Number of attempts: 1 Airway Equipment and Method: Stylet and Oral airway Placement Confirmation: ETT inserted through vocal cords under direct vision, positive ETCO2 and breath sounds checked- equal and bilateral Secured at: 22 cm Tube secured with: Tape Dental Injury: Teeth and Oropharynx as per pre-operative assessment

## 2023-09-04 NOTE — Interval H&P Note (Signed)
 History and Physical Interval Note:  09/04/2023 12:19 PM  Carrie Noble  has presented today for surgery, with the diagnosis of K83.8 biliary sludge.  The various methods of treatment have been discussed with the patient and family. After consideration of risks, benefits and other options for treatment, the patient has consented to  Procedure(s): CHOLECYSTECTOMY, ROBOT-ASSISTED, LAPAROSCOPIC (N/A) as a surgical intervention.  The patient's history has been reviewed, patient examined, no change in status, stable for surgery.  I have reviewed the patient's chart and labs.  Questions were answered to the patient's satisfaction.     [name]

## 2023-09-04 NOTE — Transfer of Care (Signed)
 Immediate Anesthesia Transfer of Care Note  Patient: Carrie Noble  Procedure(s) Performed: CHOLECYSTECTOMY, ROBOT-ASSISTED, LAPAROSCOPIC (Abdomen)  Patient Location: PACU  Anesthesia Type:General  Level of Consciousness: awake and drowsy  Airway & Oxygen Therapy: Patient Spontanous Breathing and Patient connected to face mask oxygen  Post-op Assessment: Report given to RN and Post -op Vital signs reviewed and stable  Post vital signs: Reviewed and stable  Last Vitals:  Vitals Value Taken Time  BP 145/77 09/04/23 14:30  Temp 36.5 C 09/04/23 13:57  Pulse 70 09/04/23 14:40  Resp 23 09/04/23 14:40  SpO2 97 % 09/04/23 14:40  Vitals shown include unfiled device data.  Last Pain:  Vitals:   09/04/23 1435  TempSrc:   PainSc: Asleep         Complications: There were no known notable events for this encounter.

## 2023-09-04 NOTE — Op Note (Signed)
 Preoperative diagnosis: Cholelithiasis                                           Gallbladder hyperkinesia  Postoperative diagnosis: Same  Procedure: Robotic Assisted Laparoscopic Cholecystectomy.   Anesthesia: GETA   Surgeon: Dr. Cesar Coe  Wound Classification: Clean Contaminated  Indications: Patient is a 83 y.o. female developed upper abdominal pain, nausea and on workup was found to have gallbladder sludge and 88% gallbladder ejection fraction with a normal common duct. Robotic Assisted Laparoscopic cholecystectomy was elected.  Findings:  Critical view of safety achieved Cystic duct and artery identified, ligated and divided Adequate hemostasis  Description of procedure: The patient was placed on the operating table in the supine position. General anesthesia was induced. A time-out was completed verifying correct patient, procedure, site, positioning, and implant(s) and/or special equipment prior to beginning this procedure. An orogastric tube was placed. The abdomen was prepped and draped in the usual sterile fashion.  An incision was made in a natural skin line below the umbilicus.  The fascia was elevated and the Veress needle inserted. Proper position was confirmed by aspiration and saline meniscus test.  The abdomen was insufflated with carbon dioxide to a pressure of 15 mmHg. The patient tolerated insufflation well. A 8-mm trocar was then inserted in optiview fashion.  The laparoscope was inserted and the abdomen inspected. No injuries from initial trocar placement were noted. Additional trocars were then inserted in the following locations: an 8-mm trocar in the left lateral abdomen, and another two 8-mm trocars to the right side of the abdomen 5 cm appart. The umbilical trocar was changed to a 12 mm trocar all under direct visualization. The abdomen was inspected and no abnormalities were found. The table was placed in the reverse Trendelenburg position with the right side  up. The robotic arms were docked and target anatomy identified. Instrument inserted under direct visualization.  Filmy adhesions between the gallbladder and omentum, duodenum and transverse colon were lysed with electrocautery. The dome of the gallbladder was grasped with a prograsp and retracted over the dome of the liver. The infundibulum was also grasped with an atraumatic grasper and retracted toward the right lower quadrant. This maneuver exposed Calot's triangle. The peritoneum overlying the gallbladder infundibulum was then incised and the cystic duct and cystic artery identified and circumferentially dissected. Critical view of safety reviewed before ligating any structure. Firefly images taken to visualize biliary ducts. The cystic duct and cystic artery were then doubly clipped and divided close to the gallbladder.  The gallbladder was then dissected from its peritoneal attachments by electrocautery. Hemostasis was checked and the gallbladder and contained stones were removed using an endoscopic retrieval bag. The gallbladder was passed off the table as a specimen. There was no evidence of bleeding from the gallbladder fossa or cystic artery or leakage of the bile from the cystic duct stump. Secondary trocars were removed under direct vision. No bleeding was noted. The robotic arms were undoked. The scope was withdrawn and the umbilical trocar removed. The abdomen was allowed to collapse. The fascia of the 12mm trocar sites was closed with figure-of-eight 0 vicryl sutures. The skin was closed with subcuticular sutures of 4-0 monocryl and topical skin adhesive. The orogastric tube was removed.  The patient tolerated the procedure well and was taken to the postanesthesia care unit in stable condition.   Specimen: Gallbladder  Complications: None  EBL: 5 mL

## 2023-09-05 NOTE — Anesthesia Postprocedure Evaluation (Signed)
 Anesthesia Post Note  Patient: Carrie Noble  Procedure(s) Performed: CHOLECYSTECTOMY, ROBOT-ASSISTED, LAPAROSCOPIC (Abdomen)  Patient location during evaluation: PACU Anesthesia Type: General Level of consciousness: awake and alert Pain management: pain level controlled Vital Signs Assessment: post-procedure vital signs reviewed and stable Respiratory status: spontaneous breathing, nonlabored ventilation and respiratory function stable Cardiovascular status: blood pressure returned to baseline and stable Postop Assessment: no apparent nausea or vomiting Anesthetic complications: no   There were no known notable events for this encounter.   Last Vitals:  Vitals:   09/04/23 1500 09/04/23 1517  BP:  (!) 148/72  Pulse: 67 (!) 59  Resp: 16 18  Temp:  (!) 36.3 C  SpO2: 98% 99%    Last Pain:  Vitals:   09/04/23 1517  TempSrc: Temporal  PainSc: 0-No pain                 Camellia Merilee Louder

## 2023-09-06 LAB — SURGICAL PATHOLOGY

## 2023-09-11 ENCOUNTER — Ambulatory Visit: Payer: Self-pay

## 2023-09-11 NOTE — Telephone Encounter (Signed)
 Confirmed no other acute symptoms. She will wait to see [facility] tomorrow.

## 2023-09-11 NOTE — Telephone Encounter (Signed)
 FYI Only or Action Required?: FYI only for provider.  Patient was last seen in primary care on 08/22/2023 by Glendia Shad, MD.  Called Nurse Triage reporting Dysuria.  Symptoms began several days ago.  Interventions attempted: Nothing.  Symptoms are: unchanged.  Triage Disposition: See Physician Within 24 Hours  Patient/caregiver understands and will follow disposition?: Yes    Copied from CRM #[id number]. Topic: Clinical - Red Word Triage >> Sep 11, 2023 10:41 AM [place] A wrote: [facility] that prompted transfer to Nurse Triage: burning while urinating, has some pain from gall bladder surgery Reason for Disposition  Urinating more frequently than usual (i.e., frequency) OR new-onset of the feeling of an urgent need to urinate (i.e., urgency)  Answer Assessment - Initial Assessment Questions Patient says she wants to bring over a urine sample for testing of a UTI. She says she has an uncomfortable feeling with urination. Advised she will need to be seen in the office. She says if she can speak to Dr. Freda nurse to see if she can just bring a sample. I called [facility] speaking to [place] who advised patient will need to be scheduled so an order can be put in. Advised the patient of the above, she says go ahead and schedule. Advised first available is tomorrow with a different provider, she agrees to the appointment.    1. SYMPTOM: What's the main symptom you're concerned about? (e.g., frequency, incontinence)     Uncomfortable feeling with urination 2. ONSET: When did the symptoms  start?     2 days 3. PAIN: Is there any pain? If Yes, ask: How bad is it? (Scale: 1-10; mild, moderate, severe)     4 4. CAUSE: What do you think is causing the symptoms?     Possible UTI 5. OTHER SYMPTOMS: Do you have any other symptoms? (e.g., blood in urine, fever, flank pain, pain with urination)     Uncomfortable feeling  Protocols used: Urinary Symptoms-A-AH

## 2023-09-11 NOTE — Telephone Encounter (Signed)
 Noted. Agree with evaluation given recent surgery. Please call and confirm no other symptoms, specifically no vomiting or other issues. If any acute issues, needs evaluation today.

## 2023-09-12 ENCOUNTER — Ambulatory Visit (INDEPENDENT_AMBULATORY_CARE_PROVIDER_SITE_OTHER): Admitting: Nurse Practitioner

## 2023-09-12 VITALS — BP 114/66 | HR 67 | Temp 97.7°F | Ht 63.0 in | Wt 122.6 lb

## 2023-09-12 DIAGNOSIS — R300 Dysuria: Secondary | ICD-10-CM

## 2023-09-12 DIAGNOSIS — Z9049 Acquired absence of other specified parts of digestive tract: Secondary | ICD-10-CM | POA: Diagnosis not present

## 2023-09-12 DIAGNOSIS — K219 Gastro-esophageal reflux disease without esophagitis: Secondary | ICD-10-CM | POA: Diagnosis not present

## 2023-09-12 DIAGNOSIS — R350 Frequency of micturition: Secondary | ICD-10-CM | POA: Diagnosis not present

## 2023-09-12 LAB — POC URINALSYSI DIPSTICK (AUTOMATED)
Bilirubin, UA: NEGATIVE
Glucose, UA: NEGATIVE
Leukocytes, UA: NEGATIVE
Nitrite, UA: NEGATIVE
Protein, UA: NEGATIVE
Spec Grav, UA: 1.02 (ref 1.010–1.025)
Urobilinogen, UA: 0.2 U/dL
pH, UA: 6.5 (ref 5.0–8.0)

## 2023-09-12 LAB — URINALYSIS, ROUTINE W REFLEX MICROSCOPIC
Bilirubin Urine: NEGATIVE
Hgb urine dipstick: NEGATIVE
Leukocytes,Ua: NEGATIVE
Nitrite: NEGATIVE
RBC / HPF: NONE SEEN (ref 0–?)
Specific Gravity, Urine: 1.02 (ref 1.000–1.030)
Total Protein, Urine: NEGATIVE
Urine Glucose: NEGATIVE
Urobilinogen, UA: 0.2 (ref 0.0–1.0)
pH: 6.5 (ref 5.0–8.0)

## 2023-09-12 NOTE — Progress Notes (Signed)
 [name], NP-C Phone: [phone]  Carrie Noble is a 83 y.o. female who presents today for dysuria.  Discussed the use of AI scribe software for clinical note transcription with the patient, who gave verbal consent to proceed.  History of Present Illness   Carrie Noble is an 83 year old female who presents with urinary discomfort following recent gallbladder surgery.  She experiences a burning sensation during urination, described as discomfort rather than severe pain. There is no increased frequency or urgency of urination beyond her usual pattern, and no visible hematuria. The discomfort began approximately two days ago.  She underwent gallbladder surgery eight days ago and reports some residual mild abdominal pain at the surgical site. She has a history of bladder issues and is concerned about a potential bladder infection.  She has a history of acid reflux, which has not improved post-surgery. She experiences nausea without emesis and reports a lack of appetite, attributing it to her ongoing acid reflux. She takes omeprazole , sometimes increasing the dose to two 20 mg tablets daily, and has previously tried other medications for acid reflux.  She mentions a history of constipation, which has improved since the surgery, allowing her to have more regular bowel movements. She takes several vitamins and supplements, including calcium , biotin, B12, magnesium, and Prilosec (omeprazole ). She also takes Gemtesa  for overactive bladder, which she took last night but not yet today.     Social History   Tobacco Use  Smoking Status Never  Smokeless Tobacco Never    Current Outpatient Medications on File Prior to Visit  Medication Sig Dispense Refill   atorvastatin  (LIPITOR) 10 MG tablet Take 1 tablet (10 mg total) by mouth every other day. 90 tablet 1   Biotin 1000 MCG tablet Take 1,000 mcg by mouth 3 (three) times daily. Reported on 05/13/2015     Cholecalciferol (VITAMIN D  PO) Take by  mouth daily. Reported on 05/13/2015     cyanocobalamin (VITAMIN B12) 1000 MCG tablet Take 1,000 mcg by mouth daily.     estradiol  (ESTRACE  VAGINAL) 0.1 MG/GM vaginal cream Apply one applicator q hs x 5 nights and then one applicator 2x week. 42.5 g 2   Magnesium Oxide 250 MG TABS Take 250 mg by mouth daily.     omeprazole  (PRILOSEC) 20 MG capsule Take 20 mg by mouth daily.     sertraline  (ZOLOFT ) 25 MG tablet Take 1 tablet (25 mg total) by mouth daily. (Patient taking differently: Take 25 mg by mouth daily as needed (Anxiety).) 90 tablet 1   Vibegron  (GEMTESA ) 75 MG TABS Take 1 tablet (75 mg total) by mouth daily. 90 tablet 2   traMADol  (ULTRAM ) 50 MG tablet Take 1 tablet (50 mg total) by mouth every 6 (six) hours as needed. (Patient not taking: Reported on 09/12/2023) 10 tablet 0   Current Facility-Administered Medications on File Prior to Visit  Medication Dose Route Frequency Provider Last Rate Last Admin   betamethasone  acetate-betamethasone  sodium phosphate  (CELESTONE ) injection 3 mg  3 mg Intramuscular Once Evans, Brent M, DPM       betamethasone  acetate-betamethasone  sodium phosphate  (CELESTONE ) injection 3 mg  3 mg Intramuscular Once Janit Thresa HERO, DPM         ROS see history of present illness  Objective  Physical Exam Vitals:   09/12/23 1026  BP: 114/66  Pulse: 67  Temp: 97.7 F (36.5 C)  SpO2: 96%    BP Readings from Last 3 Encounters:  09/12/23 114/66  09/04/23 ROLLEN)  148/72  08/22/23 126/72   Wt Readings from Last 3 Encounters:  09/12/23 122 lb 9.6 oz (55.6 kg)  08/22/23 127 lb (57.6 kg)  07/18/23 126 lb 9.6 oz (57.4 kg)    Physical Exam Constitutional:      General: She is not in acute distress.    Appearance: Normal appearance.  HENT:     Head: Normocephalic.  Cardiovascular:     Rate and Rhythm: Normal rate and regular rhythm.     Heart sounds: Normal heart sounds.  Pulmonary:     Effort: Pulmonary effort is normal.     Breath sounds: Normal breath  sounds.  Skin:    General: Skin is warm and dry.  Neurological:     General: No focal deficit present.     Mental Status: She is alert.  Psychiatric:        Mood and Affect: Mood normal.        Behavior: Behavior normal.      Assessment/Plan: Please see individual problem list.  Dysuria Assessment & Plan: She experiences mild dysuria and vulvar discomfort. UA in office with trace blood only. Microscopic and culture pending. We will contact patient with results. Agreed to hold on antibiotics until results are available. Increase fluid intake and re-evaluate if symptoms worsen or new symptoms develop.   Orders: -     POCT Urinalysis Dipstick (Automated) -     Urinalysis, Routine w reflex microscopic -     Urine Culture  S/P laparoscopic cholecystectomy Assessment & Plan: Mild abdominal pain is likely due to surgical recovery. No infection or complications are present. Healing well. Follow up with general surgery as scheduled   Gastroesophageal reflux disease, unspecified whether esophagitis present Assessment & Plan: Chronic GERD persists with concerns about exacerbation post-cholecystectomy. Consider increasing omeprazole  to 40 mg daily if symptoms persist. Discuss with her primary care provider about further evaluation if symptoms do not improve. Advised to avoid dietary triggers.    Urinary frequency Assessment & Plan: Her overactive bladder is managed with Gemtesa , with no recent symptom changes. Continue.       Return if symptoms worsen or fail to improve.   [name], NP-C [facility]

## 2023-09-13 LAB — URINE CULTURE
MICRO NUMBER:: 16852201
SPECIMEN QUALITY:: ADEQUATE

## 2023-09-14 ENCOUNTER — Ambulatory Visit: Payer: Self-pay | Admitting: Nurse Practitioner

## 2023-09-19 ENCOUNTER — Encounter: Payer: Self-pay | Admitting: Nurse Practitioner

## 2023-09-19 DIAGNOSIS — Z9049 Acquired absence of other specified parts of digestive tract: Secondary | ICD-10-CM | POA: Insufficient documentation

## 2023-09-19 NOTE — Assessment & Plan Note (Signed)
 Her overactive bladder is managed with Gemtesa , with no recent symptom changes. Continue.

## 2023-09-19 NOTE — Assessment & Plan Note (Signed)
 She experiences mild dysuria and vulvar discomfort. UA in office with trace blood only. Microscopic and culture pending. We will contact patient with results. Agreed to hold on antibiotics until results are available. Increase fluid intake and re-evaluate if symptoms worsen or new symptoms develop.

## 2023-09-19 NOTE — Assessment & Plan Note (Signed)
 Mild abdominal pain is likely due to surgical recovery. No infection or complications are present. Healing well. Follow up with general surgery as scheduled

## 2023-09-19 NOTE — Assessment & Plan Note (Signed)
 Chronic GERD persists with concerns about exacerbation post-cholecystectomy. Consider increasing omeprazole  to 40 mg daily if symptoms persist. Discuss with her primary care provider about further evaluation if symptoms do not improve. Advised to avoid dietary triggers.

## 2023-09-29 ENCOUNTER — Ambulatory Visit: Payer: Self-pay

## 2023-09-29 NOTE — Telephone Encounter (Signed)
 Please confirm doing ok. She can add pepcid  20mg  before evening meal to her protonix  40mg  30 minutes before breakfast. Please have her make Dr Cesar aware as well. If increased abdominal pain or acute issues, she needs to be evaluated.

## 2023-09-29 NOTE — Telephone Encounter (Signed)
Confirmed doing ok.

## 2023-09-29 NOTE — Telephone Encounter (Signed)
 FYI Only or Action Required?: Action required by provider: update on patient condition.  Patient was last seen in primary care on 09/12/2023 by Gretel App, NP.  Called Nurse Triage reporting Gastroesophageal Reflux and Heartburn.  Symptoms began several days ago.  Interventions attempted: Prescription medications: Pantoprazole .  Symptoms are: gradually worsening.  Triage Disposition: Urgent Home Treatment With Follow-up Call  Patient/caregiver understands and will follow disposition?: YesCopied from CRM #[id number]. Topic: Clinical - Red Word Triage >> Sep 29, 2023 12:01 PM Berneda FALCON wrote: Red Word that prompted transfer to Nurse Triage: Chest pain with gallbladder removal which was 3 weeks ago. Reason for Disposition  Nursing judgment or information in reference  Answer Assessment - Initial Assessment Questions 1. REASON FOR CALL: What is your main concern right now? Worsening indigestion after gallbladder surgery on 8/11. Pt already takes Pantoprazole  40mg  at night although not listed on med list. Pt states the indigestion is worse later in day. Pt is asking if another medication can be prescribed to help. Pt denied chest pains. No office appts. CAL notified. Please advise  Protocols used: No Guideline Available-A-AH

## 2023-09-29 NOTE — Telephone Encounter (Signed)
 Patient is aware of below.

## 2023-11-10 ENCOUNTER — Encounter: Payer: Self-pay | Admitting: Internal Medicine

## 2023-11-10 ENCOUNTER — Ambulatory Visit: Admitting: Internal Medicine

## 2023-11-10 VITALS — BP 126/70 | HR 84 | Resp 16 | Ht 63.0 in | Wt 120.0 lb

## 2023-11-10 DIAGNOSIS — R079 Chest pain, unspecified: Secondary | ICD-10-CM | POA: Diagnosis not present

## 2023-11-10 DIAGNOSIS — Z23 Encounter for immunization: Secondary | ICD-10-CM

## 2023-11-10 DIAGNOSIS — I700 Atherosclerosis of aorta: Secondary | ICD-10-CM

## 2023-11-10 DIAGNOSIS — K2270 Barrett's esophagus without dysplasia: Secondary | ICD-10-CM

## 2023-11-10 DIAGNOSIS — E559 Vitamin D deficiency, unspecified: Secondary | ICD-10-CM

## 2023-11-10 DIAGNOSIS — R1033 Periumbilical pain: Secondary | ICD-10-CM

## 2023-11-10 DIAGNOSIS — E041 Nontoxic single thyroid nodule: Secondary | ICD-10-CM

## 2023-11-10 DIAGNOSIS — I10 Essential (primary) hypertension: Secondary | ICD-10-CM | POA: Diagnosis not present

## 2023-11-10 DIAGNOSIS — N1831 Chronic kidney disease, stage 3a: Secondary | ICD-10-CM

## 2023-11-10 DIAGNOSIS — Z0000 Encounter for general adult medical examination without abnormal findings: Secondary | ICD-10-CM | POA: Diagnosis not present

## 2023-11-10 DIAGNOSIS — R739 Hyperglycemia, unspecified: Secondary | ICD-10-CM

## 2023-11-10 DIAGNOSIS — E7800 Pure hypercholesterolemia, unspecified: Secondary | ICD-10-CM

## 2023-11-10 DIAGNOSIS — F439 Reaction to severe stress, unspecified: Secondary | ICD-10-CM

## 2023-11-10 NOTE — Patient Instructions (Signed)
 Add pepcid  (famotidine ) 20mg  - take one tablet 30 minutes before your evening meal.

## 2023-11-10 NOTE — Progress Notes (Unsigned)
 Subjective:    Patient ID: Carrie Noble, female    DOB: Dec 20, 1940, 83 y.o.   MRN: [id number]  Patient here for  Chief Complaint  Patient presents with   Annual Exam    HPI Here for a physical exam. S/p lap cholecystectomy 09/04/23. Was having some increased GERD post surgery. On protonix . Pepcid  added. Not taking pepcid  now. Reports noticing some discomfort - chest discomfort. Reports to lower chest/epigastric region. Does not appear to worsen after eating. No chest pain or sob with increased exertion. She does report it is worse with certain positions - leanting forward and also has noticed when standing doing dishes. Still feels some issues with acid. Has been seeing GI. Had f/u with cardiology 07/21/23 - metoprolol for palpitations. Had f/u Dr Florencio 08/21/23 - no changes made. F/u Dr MALVATHEORA Laundry - thyroid  nodule. Last 05/2021 - f/u 2 years. Had f/u 06/2023 - nodule a little bigger -recommended f/u in 1 year.     Past Medical History:  Diagnosis Date   Allergy    Arthritis    Gastroesophageal reflux    Hiatal hernia 1989   status post Nissen fundoplication    Hx of hysterectomy    Hyperlipidemia    Tachycardia    Patient stated that this has been occuring frequently.   Thyroid  disease    Goiter   Past Surgical History:  Procedure Laterality Date   ABDOMINAL HYSTERECTOMY  1980   partial   blepheroplasty b/l eyes Bilateral    BREAST BIOPSY Right    neg   BREAST EXCISIONAL BIOPSY Left 2014   neg   COLONOSCOPY  2015   ESOPHAGOGASTRIC FUNDOPLICATION  1999   LAPAROSCOPIC NISSEN FUNDOPLICATION  1999   TONSILLECTOMY     as well as goiter   UPPER GI ENDOSCOPY  2015   Family History  Problem Relation Age of Onset   Stroke Mother 21   Arthritis Mother    Heart disease Mother    Hypertension Mother    Stroke Father 20   Hypertension Father    Rheumatic fever Brother        and multiple open heart surgeries   Diabetes Brother        type 2   Stroke Brother    Other  Sister        Coronary Atherosclerosis   Stroke Brother    Stroke Sister    Heart disease Sister    Dementia Sister    Cancer Sister        ovarian   Breast cancer Neg Hx    Social History   Socioeconomic History   Marital status: Married    Spouse name: Not on file   Number of children: Not on file   Years of education: Not on file   Highest education level: Not on file  Occupational History   Occupation: Retired    Associate Professor: [employer]  Tobacco Use   Smoking status: Never   Smokeless tobacco: Never  Substance and Sexual Activity   Alcohol use: No    Alcohol/week: 0.0 standard drinks of alcohol   Drug use: No   Sexual activity: Never  Other Topics Concern   Not on file  Social History Narrative   Occasionally drinks coffee.   Social Drivers of Corporate investment banker Strain: Low Risk  (07/06/2023)   Received from [facility]   Overall Financial Resource Strain (CARDIA)    Difficulty of Paying Living Expenses: Not hard  at all  Food Insecurity: No Food Insecurity (07/06/2023)   Received from [facility]   Hunger Vital Sign    Within the past 12 months, you worried that your food would run out before you got the money to buy more.: Never true    Within the past 12 months, the food you bought just didn't last and you didn't have money to get more.: Never true  Transportation Needs: No Transportation Needs (07/06/2023)   Received from [facility] - Transportation    In the past 12 months, has lack of transportation kept you from medical appointments or from getting medications?: No    Lack of Transportation (Non-Medical): No  Physical Activity: Insufficiently Active (06/17/2021)   Exercise Vital Sign    Days of Exercise per Week: 7 days    Minutes of Exercise per Session: 20 min  Stress: No Stress Concern Present (06/17/2021)   [employer] of Occupational Health - Occupational Stress Questionnaire     Feeling of Stress : Not at all  Social Connections: Unknown (06/09/2020)   Social Connection and Isolation Panel    Frequency of Communication with Friends and Family: Not on file    Frequency of Social Gatherings with Friends and Family: Not on file    Attends Religious Services: Not on file    Active Member of Clubs or Organizations: Not on file    Attends Banker Meetings: Not on file    Marital Status: Married     Review of Systems  Constitutional:  Negative for appetite change and unexpected weight change.  HENT:  Negative for congestion, sinus pressure and sore throat.   Eyes:  Negative for pain and visual disturbance.  Respiratory:  Negative for cough, chest tightness and shortness of breath.   Cardiovascular:  Negative for palpitations and leg swelling.  Gastrointestinal:  Negative for nausea and vomiting.       Lower chest / upper abd pain as outlined.   Genitourinary:  Negative for difficulty urinating and dysuria.  Musculoskeletal:  Negative for joint swelling and myalgias.  Skin:  Negative for color change and rash.  Neurological:  Negative for dizziness and headaches.  Hematological:  Negative for adenopathy. Does not bruise/bleed easily.  Psychiatric/Behavioral:  Negative for agitation and dysphoric mood.        Objective:     BP 126/70   Pulse 84   Resp 16   Ht 5' 3 (1.6 m)   Wt 120 lb (54.4 kg)   SpO2 98%   BMI 21.26 kg/m  Wt Readings from Last 3 Encounters:  11/10/23 120 lb (54.4 kg)  09/12/23 122 lb 9.6 oz (55.6 kg)  08/22/23 127 lb (57.6 kg)    Physical Exam Vitals reviewed.  Constitutional:      General: She is not in acute distress.    Appearance: Normal appearance. She is well-developed.  HENT:     Head: Normocephalic and atraumatic.     Right Ear: External ear normal.     Left Ear: External ear normal.     Mouth/Throat:     Pharynx: No oropharyngeal exudate or posterior oropharyngeal erythema.  Eyes:     General: No scleral  icterus.       Right eye: No discharge.        Left eye: No discharge.     Conjunctiva/sclera: Conjunctivae normal.  Neck:     Thyroid : No thyromegaly.  Cardiovascular:     Rate  and Rhythm: Normal rate and regular rhythm.  Pulmonary:     Effort: No tachypnea, accessory muscle usage or respiratory distress.     Breath sounds: Normal breath sounds. No decreased breath sounds or wheezing.  Chest:  Breasts:    Right: No inverted nipple, mass, nipple discharge or tenderness (no axillary adenopathy).     Left: No inverted nipple, mass, nipple discharge or tenderness (no axilarry adenopathy).  Abdominal:     General: Bowel sounds are normal.     Palpations: Abdomen is soft.     Tenderness: There is no abdominal tenderness.  Musculoskeletal:        General: No swelling or tenderness.     Cervical back: Neck supple.  Lymphadenopathy:     Cervical: No cervical adenopathy.  Skin:    Findings: No erythema or rash.  Neurological:     Mental Status: She is alert and oriented to person, place, and time.  Psychiatric:        Mood and Affect: Mood normal.        Behavior: Behavior normal.         Outpatient Encounter Medications as of 11/10/2023  Medication Sig   atorvastatin  (LIPITOR) 10 MG tablet Take 1 tablet (10 mg total) by mouth every other day.   Biotin 1000 MCG tablet Take 1,000 mcg by mouth 3 (three) times daily. Reported on 05/13/2015   Cholecalciferol (VITAMIN D  PO) Take by mouth daily. Reported on 05/13/2015   cyanocobalamin (VITAMIN B12) 1000 MCG tablet Take 1,000 mcg by mouth daily.   estradiol  (ESTRACE  VAGINAL) 0.1 MG/GM vaginal cream Apply one applicator q hs x 5 nights and then one applicator 2x week.   Magnesium Oxide 250 MG TABS Take 250 mg by mouth daily.   omeprazole  (PRILOSEC) 20 MG capsule Take 20 mg by mouth daily.   sertraline  (ZOLOFT ) 25 MG tablet Take 1 tablet (25 mg total) by mouth daily. (Patient taking differently: Take 25 mg by mouth daily as needed  (Anxiety).)   traMADol  (ULTRAM ) 50 MG tablet Take 1 tablet (50 mg total) by mouth every 6 (six) hours as needed. (Patient not taking: Reported on 09/12/2023)   Vibegron  (GEMTESA ) 75 MG TABS Take 1 tablet (75 mg total) by mouth daily.   Facility-Administered Encounter Medications as of 11/10/2023  Medication   betamethasone  acetate-betamethasone  sodium phosphate  (CELESTONE ) injection 3 mg   betamethasone  acetate-betamethasone  sodium phosphate  (CELESTONE ) injection 3 mg     Lab Results  Component Value Date   WBC 4.8 08/22/2023   HGB 12.8 08/22/2023   HCT 38.0 08/22/2023   PLT 348.0 08/22/2023   GLUCOSE 91 08/22/2023   CHOL 195 08/22/2023   TRIG 141.0 08/22/2023   HDL 60.00 08/22/2023   LDLDIRECT 141.0 09/01/2016   LDLCALC 107 (H) 08/22/2023   ALT 15 08/22/2023   AST 19 08/22/2023   NA 139 08/22/2023   K 4.8 08/22/2023   CL 101 08/22/2023   CREATININE 0.99 08/22/2023   BUN 13 08/22/2023   CO2 29 08/22/2023   TSH 0.49 08/22/2023   HGBA1C 5.7 08/22/2023       Assessment & Plan:  Health care maintenance Assessment & Plan: Physical today 11/10/23.  Mammogram 05/20/22 - Birads 0.  F/u bilateral diagnositc mammogram -Birads I.  Recommended continue annual screening mammogram.   Mammogram 05/24/23 - Birads I. Colonoscopy 2015.  Recommended f/u in 10 years.     Chest pain, unspecified type Assessment & Plan: Lower chest/upper abdominal pain as outlined. Has seen cardiology  as outlined. EKG today - SR with no acute ischemic changes. Does not appear to be cardiac. Discussed possible MSK origin - given worsened by positions. Discussed avoiding strained positions and discussed posture. Follow. Plan GI f/u and add pepcid  as outlined.   Orders: -     EKG 12-Lead  Periumbilical abdominal pain -     CBC with Differential/Platelet; Future  Hyperglycemia Assessment & Plan: Follow met b and A1c.   Orders: -     Hemoglobin A1c; Future  Hypercholesterolemia Assessment &  Plan: Continue statin medication. Follow lipid panel.   Orders: -     Hepatic function panel; Future -     Lipid panel; Future -     Basic metabolic panel with GFR; Future  Immunization due -     Flu vaccine HIGH DOSE PF(Fluzone Trivalent)  Vitamin D  deficiency Assessment & Plan: Low last check. Follow vitamin D  level.    Thyroid  nodule Assessment & Plan: Saw Dr Cherilyn 06/10/21 - recommended f/u ultrasound in 2 years.  Due f/u this year.   F/u Dr MALVATHEORA Laundry - thyroid  nodule. Last 05/2021 - f/u 2 years. Had f/u 06/2023 - nodule a little bigger -recommended f/u in 1 year.    Stress Assessment & Plan: Continue zoloft . Follow.    Aortic atherosclerosis Assessment & Plan: Continue lipitor.    Barrett's esophagus without dysplasia Assessment & Plan:  Had f/u with GI 07/18/23 - recommended starting voquezna. Has not started yet. Is currently on protonix  - per her report. Add pepcid  as previously discussed. Plan f/u with GI.    Benign essential HTN Assessment & Plan: On no medication. Follow pressures.    Stage 3a chronic kidney disease (HCC) Assessment & Plan: Continue to avoid antiinflammatory medication. Discussed. Stay hydrated. Follow metabolic panel.       Allena Hamilton, MD

## 2023-11-10 NOTE — Assessment & Plan Note (Signed)
 Physical today 11/10/23.  Mammogram 05/20/22 - Birads 0.  F/u bilateral diagnositc mammogram -Birads I.  Recommended continue annual screening mammogram.   Mammogram 05/24/23 - Birads I. Colonoscopy 2015.  Recommended f/u in 10 years.

## 2023-11-11 ENCOUNTER — Encounter: Payer: Self-pay | Admitting: Internal Medicine

## 2023-11-11 NOTE — Assessment & Plan Note (Signed)
 Follow met b and A1c.

## 2023-11-11 NOTE — Assessment & Plan Note (Signed)
 Saw Dr Cherilyn 06/10/21 - recommended f/u ultrasound in 2 years.  Due f/u this year.   F/u Dr MALVATHEORA Laundry - thyroid  nodule. Last 05/2021 - f/u 2 years. Had f/u 06/2023 - nodule a little bigger -recommended f/u in 1 year.

## 2023-11-11 NOTE — Assessment & Plan Note (Signed)
 Low last check. Follow vitamin D  level.

## 2023-11-11 NOTE — Assessment & Plan Note (Signed)
 Lower chest/upper abdominal pain as outlined. Has seen cardiology as outlined. EKG today - SR with no acute ischemic changes. Does not appear to be cardiac. Discussed possible MSK origin - given worsened by positions. Discussed avoiding strained positions and discussed posture. Follow. Plan GI f/u and add pepcid  as outlined.

## 2023-11-11 NOTE — Assessment & Plan Note (Signed)
 Continue lipitor  ?

## 2023-11-11 NOTE — Assessment & Plan Note (Signed)
Continue zoloft.  Follow.   

## 2023-11-11 NOTE — Assessment & Plan Note (Signed)
On no medication.  Follow pressures.   ?

## 2023-11-11 NOTE — Assessment & Plan Note (Signed)
 Had f/u with GI 07/18/23 - recommended starting voquezna. Has not started yet. Is currently on protonix  - per her report. Add pepcid  as previously discussed. Plan f/u with GI.

## 2023-11-11 NOTE — Assessment & Plan Note (Signed)
 Continue to avoid antiinflammatory medication. Discussed. Stay hydrated. Follow metabolic panel.

## 2023-11-11 NOTE — Assessment & Plan Note (Signed)
 Continue statin medication. Follow lipid panel.

## 2023-12-08 ENCOUNTER — Encounter: Payer: Self-pay | Admitting: Nurse Practitioner

## 2023-12-08 ENCOUNTER — Ambulatory Visit: Admitting: Nurse Practitioner

## 2023-12-08 VITALS — BP 126/78 | HR 78 | Temp 98.1°F | Ht 63.0 in | Wt 122.8 lb

## 2023-12-08 DIAGNOSIS — K219 Gastro-esophageal reflux disease without esophagitis: Secondary | ICD-10-CM

## 2023-12-08 DIAGNOSIS — H04123 Dry eye syndrome of bilateral lacrimal glands: Secondary | ICD-10-CM | POA: Diagnosis not present

## 2023-12-08 DIAGNOSIS — R682 Dry mouth, unspecified: Secondary | ICD-10-CM

## 2023-12-08 NOTE — Progress Notes (Signed)
 Established Patient Office Visit  Subjective:  Patient ID: Carrie Noble, female    DOB: September 22, 1940  Age: 83 y.o. MRN: [id number]  CC:  Chief Complaint  Patient presents with   Acute Visit    Burning in throat & dry mouth (states Dr. Glendia knows all about this) Wants referral to esophagus specialist   Discussed the use of AI scribe software for clinical note transcription with the patient, who gave verbal consent to proceed.  History of Present Illness Carrie Noble is an 83 year old female who presents with dryness in her throat and eyes.  She experiences dryness in her throat and eyes, worsening over several years, with nocturnal dryness in her mouth and throat accompanied by burning sensations. Her eyes swell and cause discomfort. 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 She experiences dryness and burning in her throat, particularly at night, and takes omeprazole  in the morning and Pepcid  at night. No significant daytime dryness unless napping. She would like to get check for Sjogren's due to dry mouth, dry eyes and arthritis.   She has an upcoming appointment with a gastroenterologist to see Maryl Stanford on 01/09/24.  She is status post nissen fundoplication. She has seen Gastroenterology Ms. Courteny PA at [facility] on 06/30/22. Recommended continuous use of PPI.  Past Medical History:  Diagnosis Date   Allergy    Arthritis    Gastroesophageal reflux    Hiatal hernia 1989   status post Nissen fundoplication    Hx of hysterectomy    Hyperlipidemia    Tachycardia    Patient stated that this has been occuring frequently.   Thyroid  disease    Goiter    Past Surgical History:  Procedure Laterality Date   ABDOMINAL HYSTERECTOMY  1980   partial   blepheroplasty b/l eyes Bilateral    BREAST BIOPSY Right    neg   BREAST EXCISIONAL BIOPSY Left 2014   neg   COLONOSCOPY  2015   ESOPHAGOGASTRIC FUNDOPLICATION  1999    LAPAROSCOPIC NISSEN FUNDOPLICATION  1999   TONSILLECTOMY     as well as goiter   UPPER GI ENDOSCOPY  2015    Family History  Problem Relation Age of Onset   Stroke Mother 73   Arthritis Mother    Heart disease Mother    Hypertension Mother    Stroke Father 53   Hypertension Father    Rheumatic fever Brother        and multiple open heart surgeries   Diabetes Brother        type 2   Stroke Brother    Other Sister        Coronary Atherosclerosis   Stroke Brother    Stroke Sister    Heart disease Sister    Dementia Sister    Cancer Sister        ovarian   Breast cancer Neg Hx     Social History   Socioeconomic History   Marital status: Married    Spouse name: Not on file   Number of children: Not on file   Years of education: Not on file   Highest education level: Not on file  Occupational History   Occupation: Retired    Associate Professor: [employer]  Tobacco Use   Smoking status: Never   Smokeless tobacco: Never  Substance and Sexual Activity   Alcohol use: No    Alcohol/week: 0.0 standard drinks of alcohol   Drug  use: No   Sexual activity: Never  Other Topics Concern   Not on file  Social History Narrative   Occasionally drinks coffee.   Social Drivers of Corporate Investment Banker Strain: Low Risk  (07/06/2023)   Received from [facility]   Overall Financial Resource Strain (CARDIA)    Difficulty of Paying Living Expenses: Not hard at all  Food Insecurity: No Food Insecurity (07/06/2023)   Received from [facility]   Hunger Vital Sign    Within the past 12 months, you worried that your food would run out before you got the money to buy more.: Never true    Within the past 12 months, the food you bought just didn't last and you didn't have money to get more.: Never true  Transportation Needs: No Transportation Needs (07/06/2023)   Received from [facility] - Transportation    In the past 12 months, has  lack of transportation kept you from medical appointments or from getting medications?: No    Lack of Transportation (Non-Medical): No  Physical Activity: Insufficiently Active (06/17/2021)   Exercise Vital Sign    Days of Exercise per Week: 7 days    Minutes of Exercise per Session: 20 min  Stress: No Stress Concern Present (06/17/2021)   [employer] of Occupational [employer] - Occupational Stress Questionnaire    Feeling of Stress : Not at all  Social Connections: Unknown (06/09/2020)   Social Connection and Isolation Panel    Frequency of Communication with Friends and Family: Not on file    Frequency of Social Gatherings with Friends and Family: Not on file    Attends Religious Services: Not on file    Active Member of Clubs or Organizations: Not on file    Attends Banker Meetings: Not on file    Marital Status: Married  Intimate Partner Violence: Not At Risk (06/17/2021)   Humiliation, Afraid, Rape, and Kick questionnaire    Fear of Current or Ex-Partner: No    Emotionally Abused: No    Physically Abused: No    Sexually Abused: No     Outpatient Medications Prior to Visit  Medication Sig Dispense Refill   atorvastatin  (LIPITOR) 10 MG tablet Take 1 tablet (10 mg total) by mouth every other day. 90 tablet 1   Biotin 1000 MCG tablet Take 1,000 mcg by mouth 3 (three) times daily. Reported on 05/13/2015     Cholecalciferol (VITAMIN D  PO) Take by mouth daily. Reported on 05/13/2015     cyanocobalamin (VITAMIN B12) 1000 MCG tablet Take 1,000 mcg by mouth daily.     estradiol  (ESTRACE  VAGINAL) 0.1 MG/GM vaginal cream Apply one applicator q hs x 5 nights and then one applicator 2x week. 42.5 g 2   Magnesium Oxide 250 MG TABS Take 250 mg by mouth daily.     omeprazole  (PRILOSEC) 20 MG capsule Take 20 mg by mouth daily.     sertraline  (ZOLOFT ) 25 MG tablet Take 1 tablet (25 mg total) by mouth daily. (Patient taking differently: Take 25 mg by mouth daily as needed (Anxiety).)  90 tablet 1   traMADol  (ULTRAM ) 50 MG tablet Take 1 tablet (50 mg total) by mouth every 6 (six) hours as needed. 10 tablet 0   Vibegron  (GEMTESA ) 75 MG TABS Take 1 tablet (75 mg total) by mouth daily. 90 tablet 2   Facility-Administered Medications Prior to Visit  Medication Dose Route Frequency Provider Last Rate  Last Admin   betamethasone  acetate-betamethasone  sodium phosphate  (CELESTONE ) injection 3 mg  3 mg Intramuscular Once Evans, Brent M, DPM       betamethasone  acetate-betamethasone  sodium phosphate  (CELESTONE ) injection 3 mg  3 mg Intramuscular Once Evans, Brent M, DPM        Allergies  Allergen Reactions   Darvocet [Propoxyphene N-Acetaminophen ]     Patient stated that medication made her heart beat fast.   Misc. Sulfonamide Containing Compounds Other (See Comments)   Morphine Other (See Comments)    morphine   Morphine And Codeine     Patient stated that medication made her heart beat fast.   Sucralfate Nausea Only    congestion    ROS Review of Systems Negative unless indicated in HPI.    Objective:    Physical Exam Constitutional:      Appearance: Normal appearance.  HENT:     Mouth/Throat:     Mouth: Mucous membranes are moist.  Eyes:     Conjunctiva/sclera: Conjunctivae normal.     Pupils: Pupils are equal, round, and reactive to light.  Cardiovascular:     Rate and Rhythm: Normal rate and regular rhythm.     Pulses: Normal pulses.     Heart sounds: Normal heart sounds.  Pulmonary:     Effort: Pulmonary effort is normal.     Breath sounds: Normal breath sounds.  Musculoskeletal:     Cervical back: Normal range of motion. No tenderness.  Skin:    General: Skin is warm.     Findings: No bruising.  Neurological:     General: No focal deficit present.     Mental Status: She is alert and oriented to person, place, and time. Mental status is at baseline.  Psychiatric:        Mood and Affect: Mood normal.        Behavior: Behavior normal.         Thought Content: Thought content normal.        Judgment: Judgment normal.     BP 126/78   Pulse 78   Temp 98.1 F (36.7 C)   Ht 5' 3 (1.6 m)   Wt 122 lb 12.8 oz (55.7 kg)   SpO2 97%   BMI 21.75 kg/m  Wt Readings from Last 3 Encounters:  12/08/23 122 lb 12.8 oz (55.7 kg)  11/10/23 120 lb (54.4 kg)  09/12/23 122 lb 9.6 oz (55.6 kg)     Health Maintenance  Topic Date Due   Zoster Vaccines- Shingrix (1 of 2) Never done   Medicare Annual Wellness (AWV)  06/18/2022   DTaP/Tdap/Td (2 - Tdap) 09/20/2023   COVID-19 Vaccine (3 - 2025-26 season) 09/25/2023   Pneumococcal Vaccine: 50+ Years (1 of 2 - PCV) 08/21/2024 (Originally 05/12/1959)   Mammogram  05/23/2024   Influenza Vaccine  Completed   Bone Density Scan  Completed   Meningococcal B Vaccine  Aged Out    There are no preventive care reminders to display for this patient.  Lab Results  Component Value Date   TSH 0.49 08/22/2023   Lab Results  Component Value Date   WBC 4.8 08/22/2023   HGB 12.8 08/22/2023   HCT 38.0 08/22/2023   MCV 93.6 08/22/2023   PLT 348.0 08/22/2023   Lab Results  Component Value Date   NA 139 08/22/2023   K 4.8 08/22/2023   CO2 29 08/22/2023   GLUCOSE 91 08/22/2023   BUN 13 08/22/2023   CREATININE 0.99 08/22/2023  BILITOT 0.5 08/22/2023   ALKPHOS 97 08/22/2023   AST 19 08/22/2023   ALT 15 08/22/2023   PROT 7.0 08/22/2023   ALBUMIN 4.5 08/22/2023   CALCIUM  9.6 08/22/2023   ANIONGAP 11 09/17/2020   GFR 52.44 (L) 08/22/2023   Lab Results  Component Value Date   CHOL 195 08/22/2023   Lab Results  Component Value Date   HDL 60.00 08/22/2023   Lab Results  Component Value Date   LDLCALC 107 (H) 08/22/2023   Lab Results  Component Value Date   TRIG 141.0 08/22/2023   Lab Results  Component Value Date   CHOLHDL 3 08/22/2023   Lab Results  Component Value Date   HGBA1C 5.7 08/22/2023      Assessment & Plan:   Assessment & Plan Dry mouth Chronic nocturnal  dryness and burning. - Continue omeprazole  in the morning before breakfast daily. - Continue famotidine  at night. -Will check labs at outlined.  - Orders:   ANA,IFA Sjogrens' Pnl rflx Tit/Patn   C-reactive protein   Ambulatory referral to Rheumatology  Dry eyes Chronic dry eyes with swelling and discomfort. No recent ophthalmology evaluation or use of eye drops. Orders:   ANA,IFA Sjogrens' Pnl rflx Tit/Patn   C-reactive protein   Ambulatory referral to Rheumatology  Gastroesophageal reflux disease, unspecified whether esophagitis present GERD with previous esophageal surgery. Symptoms include nocturnal dryness and burning in the throat. Managed with omaprazole and famotidine . Recent gallbladder surgery. - Continue omeprazole  in the morning before eating or drinking. - Continue famotidine  at night. - Ensure follow-up with GI next month.     Assessment and Plan Assessment & Plan    Follow-up: Return if symptoms worsen or fail to improve.   Dhyan Noah, NP

## 2023-12-11 ENCOUNTER — Ambulatory Visit: Payer: Self-pay | Admitting: Nurse Practitioner

## 2023-12-11 LAB — C-REACTIVE PROTEIN: CRP: <3 mg/L (ref <8.0)

## 2023-12-11 LAB — ANA,IFA SJOGRENS' PNL RFLX TIT/PATN
Anti Nuclear Antibody (ANA): POSITIVE — AB
Rheumatoid fact SerPl-aCnc: <10 [IU]/mL (ref <14)
SSA (Ro) (ENA) Antibody, IgG: <1 AI
SSB (La) (ENA) Antibody, IgG: <1 AI

## 2023-12-11 LAB — ANTI-NUCLEAR AB-TITER (ANA TITER): ANA Titer 1: 1:40 {titer} — ABNORMAL HIGH

## 2023-12-11 NOTE — Progress Notes (Signed)
 Please inform patient that inflammation marker normal. Sjogerns and few  labs result pending.

## 2023-12-14 ENCOUNTER — Encounter: Payer: Self-pay | Admitting: Nurse Practitioner

## 2023-12-14 NOTE — Assessment & Plan Note (Signed)
 Chronic nocturnal dryness and burning. - Continue omeprazole  in the morning before breakfast daily. - Continue famotidine  at night. -Will check labs at outlined.  - Orders:   ANA,IFA Sjogrens' Pnl rflx Tit/Patn   C-reactive protein   Ambulatory referral to Rheumatology

## 2023-12-14 NOTE — Assessment & Plan Note (Signed)
 GERD with previous esophageal surgery. Symptoms include nocturnal dryness and burning in the throat. Managed with omaprazole and famotidine . Recent gallbladder surgery. - Continue omeprazole  in the morning before eating or drinking. - Continue famotidine  at night. - Ensure follow-up with GI next month.

## 2023-12-14 NOTE — Progress Notes (Signed)
 Please inform pt the inflammation marker and Sjogren blood work normal. I have placed referral to rheumatology for further evaluation.

## 2023-12-14 NOTE — Assessment & Plan Note (Signed)
 Chronic dry eyes with swelling and discomfort. No recent ophthalmology evaluation or use of eye drops. Orders:   ANA,IFA Sjogrens' Pnl rflx Tit/Patn   C-reactive protein   Ambulatory referral to Rheumatology

## 2024-01-01 ENCOUNTER — Encounter: Payer: Self-pay | Admitting: Family

## 2024-01-01 ENCOUNTER — Ambulatory Visit: Admitting: Family

## 2024-01-01 ENCOUNTER — Telehealth: Payer: Self-pay | Admitting: Family

## 2024-01-01 VITALS — BP 126/70 | HR 71 | Temp 98.0°F | Ht 63.0 in | Wt 119.6 lb

## 2024-01-01 DIAGNOSIS — J309 Allergic rhinitis, unspecified: Secondary | ICD-10-CM

## 2024-01-01 DIAGNOSIS — K219 Gastro-esophageal reflux disease without esophagitis: Secondary | ICD-10-CM

## 2024-01-01 MED ORDER — PANTOPRAZOLE SODIUM 40 MG PO TBEC: 40.0000 mg | DELAYED_RELEASE_TABLET | Freq: Two times a day (BID) | ORAL | 0 refills | Status: AC

## 2024-01-01 NOTE — Progress Notes (Unsigned)
   Assessment & Plan:  There are no diagnoses linked to this encounter.   Return precautions given.   Risks, benefits, and alternatives of the medications and treatment plan prescribed today were discussed, and patient expressed understanding.   Education regarding symptom management and diagnosis given to patient on AVS either electronically or printed.  No follow-ups on file.  Rollene Northern, FNP  Subjective:    Patient ID: Carrie Noble, female    DOB: 1940/02/11, 83 y.o.   MRN: [id number]  CC: Carrie Noble is a 83 y.o. female who presents today for an acute visit.    HPI: HPI Complains thick nasal congestion, hoarseness x 1 day Hoarseness started today Occasional dry cough  Epigastric burning for months.  Occassionnal spit up.   Worse when laying down, after spicy foods.   No vomiting, fever, trouble swallowing, sob  History of GERD, Barrett's esophagus, hypertension, allergic rhinitis, chronic kidney disease GFR 52 Swallowing study 12/2019  Small hiatal hernia. Moderate gastroesophageal reflux.  Endoscopy 10/2016; colonoscopy 03/2013 History of Nissen fundoplication  GI f/u 01/09/24 Dr maryruth 07/04/24 follow up endocrine for goiter  Allergies: Darvocet [propoxyphene n-acetaminophen ], Misc. sulfonamide containing compounds, Morphine, Morphine and codeine, and Sucralfate Current Outpatient Medications on File Prior to Visit  Medication Sig Dispense Refill   atorvastatin  (LIPITOR) 10 MG tablet Take 1 tablet (10 mg total) by mouth every other day. 90 tablet 1   Biotin 1000 MCG tablet Take 1,000 mcg by mouth 3 (three) times daily. Reported on 05/13/2015     Cholecalciferol (VITAMIN D  PO) Take by mouth daily. Reported on 05/13/2015     cyanocobalamin (VITAMIN B12) 1000 MCG tablet Take 1,000 mcg by mouth daily.     estradiol  (ESTRACE  VAGINAL) 0.1 MG/GM vaginal cream Apply one applicator q hs x 5 nights and then one applicator 2x week. 42.5 g 2   Magnesium Oxide 250 MG  TABS Take 250 mg by mouth daily.     omeprazole  (PRILOSEC) 20 MG capsule Take 20 mg by mouth daily.     sertraline  (ZOLOFT ) 25 MG tablet Take 1 tablet (25 mg total) by mouth daily. (Patient taking differently: Take 25 mg by mouth daily as needed (Anxiety).) 90 tablet 1   traMADol  (ULTRAM ) 50 MG tablet Take 1 tablet (50 mg total) by mouth every 6 (six) hours as needed. 10 tablet 0   Vibegron  (GEMTESA ) 75 MG TABS Take 1 tablet (75 mg total) by mouth daily. 90 tablet 2   Current Facility-Administered Medications on File Prior to Visit  Medication Dose Route Frequency Provider Last Rate Last Admin   betamethasone  acetate-betamethasone  sodium phosphate  (CELESTONE ) injection 3 mg  3 mg Intramuscular Once Janit Thresa HERO, DPM       betamethasone  acetate-betamethasone  sodium phosphate  (CELESTONE ) injection 3 mg  3 mg Intramuscular Once Janit Thresa HERO, DPM        Review of Systems    Objective:    BP 126/70   Pulse 71   Temp 98 F (36.7 C) (Oral)   Ht 5' 3 (1.6 m)   Wt 119 lb 9.6 oz (54.3 kg)   SpO2 96%   BMI 21.19 kg/m   BP Readings from Last 3 Encounters:  01/01/24 126/70  12/08/23 126/78  11/10/23 126/70   Wt Readings from Last 3 Encounters:  01/01/24 119 lb 9.6 oz (54.3 kg)  12/08/23 122 lb 12.8 oz (55.7 kg)  11/10/23 120 lb (54.4 kg)    Physical Exam

## 2024-01-01 NOTE — Telephone Encounter (Signed)
 What is status of rheumatology referral?

## 2024-01-01 NOTE — Patient Instructions (Addendum)
 As discussed I would like to temporarily increase Protonix  40 mg twice daily for the next 7 days until you are seen by gastroenterology.    Afterwards, I will defer to them if they would like to continue on high doses of Protonix .  As discussed there are long-term sequela and side effects that we worry about on higher doses of Protonix  so I do not recommend staying on prolonged twice daily dosing if not needed  For thick congestion, you may purchase over-the-counter guaifenesin (Mucinex).  If you need a cough suppressant, you may then purchase Mucinex-DM.  Please let us  know if any concerns

## 2024-01-02 NOTE — Assessment & Plan Note (Signed)
 Presentation consistent with GERD.  History of Barrett's esophagus.  Advised to increase pantoprazole  to 40 mg twice daily until she is seen by GI next week.  Discussed long-term sequela of PPI use at high doses and advocated for her to decrease to pantoprazole  40 mg once daily if able.  I advise she may also add on Pepcid  20 mg twice daily as needed for breakthrough symptoms.

## 2024-01-02 NOTE — Assessment & Plan Note (Signed)
 Nasal congestion x 1 day, associated hoarseness.  Discussed more likely viral etiology.  Advised use of mucinex  or Mucinex DM otc if needed.  She will let me know if symptoms do not resolve with conservative measures

## 2024-01-08 ENCOUNTER — Ambulatory Visit: Payer: Self-pay

## 2024-01-08 NOTE — Telephone Encounter (Signed)
 Patient disconnected upon transfer to NT. Attempted x2 to reach patient, phone picks up and no one is on the other end then hangs up. Unable to leave message.  Copied from CRM #[id number]. Topic: Clinical - Red Word Triage >> Jan 08, 2024  1:15 PM China J wrote: [facility] that prompted transfer to Nurse Triage: Patient is having severe hip pain from her arthritis. She is needing an injection to help with this and would like to see if there is a nurse visit available for today.

## 2024-01-09 ENCOUNTER — Encounter: Payer: Self-pay | Admitting: Family

## 2024-01-09 ENCOUNTER — Telehealth: Payer: Self-pay | Admitting: Internal Medicine

## 2024-01-09 ENCOUNTER — Ambulatory Visit: Admitting: Family

## 2024-01-09 ENCOUNTER — Ambulatory Visit

## 2024-01-09 VITALS — BP 134/82 | HR 69 | Temp 98.1°F | Ht 63.0 in | Wt 118.4 lb

## 2024-01-09 DIAGNOSIS — M5417 Radiculopathy, lumbosacral region: Secondary | ICD-10-CM

## 2024-01-09 MED ORDER — TRAMADOL HCL 50 MG PO TABS: 50.0000 mg | ORAL_TABLET | Freq: Three times a day (TID) | ORAL | 0 refills | Status: AC | PRN

## 2024-01-09 MED ORDER — MELOXICAM 7.5 MG PO TABS: 7.5000 mg | ORAL_TABLET | Freq: Every day | ORAL | 0 refills | Status: DC

## 2024-01-09 NOTE — Telephone Encounter (Signed)
 Called and spoke to Ovid. Please hold for work in appt.

## 2024-01-09 NOTE — Telephone Encounter (Signed)
 Christiane London from [employer] called and would like Dr Glendia to call her. Her cell phone number is [phone].

## 2024-01-09 NOTE — Progress Notes (Signed)
 Acute Office Visit  Sbjective:     Patient ID: Carrie Noble, female    DOB: 29-May-1940, 83 y.o.   MRN: [id number]  Chief Complaint  Patient presents with   Acute Visit    Right hip and leg hurting Pain 8/10 Feels like she did when she fell on 03/10/23 on the right hip    HPI Patient is in today with complaints of right hip pain x 1 week.  Reports the pain feels similar to when she had a fall on March 10, 2023.  However, she has not had a fall this time.  Pain is 8 out of 10, worse with bending at the waist and laying down.  Has been taking Tylenol  that has helped some.  Reports having pain radiate down her right leg into her toes and having sensitivity to touch of the skin.  No recent back injury.   Review of Systems  Constitutional: Negative.   Respiratory: Negative.    Cardiovascular: Negative.   Musculoskeletal:  Positive for back pain.       Right hip pain  Neurological:        Pain radiates down her right leg  Psychiatric/Behavioral: Negative.    All other systems reviewed and are negative.  Past Medical History:  Diagnosis Date   Allergy    Arthritis    Gastroesophageal reflux    Hiatal hernia 1989   status post Nissen fundoplication    Hx of hysterectomy    Hyperlipidemia    Tachycardia    Patient stated that this has been occuring frequently.   Thyroid  disease    Goiter    Social History   Socioeconomic History   Marital status: Married    Spouse name: Not on file   Number of children: Not on file   Years of education: Not on file   Highest education level: Not on file  Occupational History   Occupation: Retired    Associate Professor: [employer]  Tobacco Use   Smoking status: Never   Smokeless tobacco: Never  Substance and Sexual Activity   Alcohol use: No    Alcohol/week: 0.0 standard drinks of alcohol   Drug use: No   Sexual activity: Never  Other Topics Concern   Not on file  Social History Narrative   Occasionally drinks coffee.    Social Drivers of Health   Tobacco Use: Low Risk (01/09/2024)   Patient History    Smoking Tobacco Use: Never    Smokeless Tobacco Use: Never    Passive Exposure: Not on file  Financial Resource Strain: Low Risk  (07/06/2023)   Received from [facility]   Overall Financial Resource Strain (CARDIA)    Difficulty of Paying Living Expenses: Not hard at all  Food Insecurity: No Food Insecurity (07/06/2023)   Received from [facility]   Epic    Within the past 12 months, you worried that your food would run out before you got the money to buy more.: Never true    Within the past 12 months, the food you bought just didn't last and you didn't have money to get more.: Never true  Transportation Needs: No Transportation Needs (07/06/2023)   Received from [facility] - Transportation    In the past 12 months, has lack of transportation kept you from medical appointments or from getting medications?: No    Lack of Transportation (Non-Medical): No  Physical Activity: Insufficiently Active (06/17/2021)  Exercise Vital Sign    Days of Exercise per Week: 7 days    Minutes of Exercise per Session: 20 min  Stress: No Stress Concern Present (06/17/2021)   [employer] - Occupational Stress Questionnaire    Feeling of Stress : Not at all  Social Connections: Not on file  Intimate Partner Violence: Not At Risk (06/17/2021)   Humiliation, Afraid, Rape, and Kick questionnaire    Fear of Current or Ex-Partner: No    Emotionally Abused: No    Physically Abused: No    Sexually Abused: No  Depression (PHQ2-9): Low Risk (01/09/2024)   Depression (PHQ2-9)    PHQ-2 Score: 0  Alcohol Screen: Not on file  Housing: Low Risk  (07/06/2023)   Received from [facility]   Epic    In the last 12 months, was there a time when you were not able to pay the mortgage or rent on time?: No     In the past 12 months, how many times have you moved where you were living?: 0    At any time in the past 12 months, were you homeless or living in a shelter (including now)?: No  Utilities: Not At Risk (07/06/2023)   Received from [facility]   Epic    In the past 12 months has the electric, gas, oil, or water company threatened to shut off services in your home?: No  Health Literacy: Not on file    Past Surgical History:  Procedure Laterality Date   ABDOMINAL HYSTERECTOMY  1980   partial   blepheroplasty b/l eyes Bilateral    BREAST BIOPSY Right    neg   BREAST EXCISIONAL BIOPSY Left 2014   neg   COLONOSCOPY  2015   ESOPHAGOGASTRIC FUNDOPLICATION  1999   LAPAROSCOPIC NISSEN FUNDOPLICATION  1999   TONSILLECTOMY     as well as goiter   UPPER GI ENDOSCOPY  2015    Family History  Problem Relation Age of Onset   Stroke Mother 85   Arthritis Mother    Heart disease Mother    Hypertension Mother    Stroke Father 79   Hypertension Father    Rheumatic fever Brother        and multiple open heart surgeries   Diabetes Brother        type 2   Stroke Brother    Other Sister        Coronary Atherosclerosis   Stroke Brother    Stroke Sister    Heart disease Sister    Dementia Sister    Cancer Sister        ovarian   Breast cancer Neg Hx     Allergies[1]  Medications Ordered Prior to Encounter[2]  BP 134/82   Pulse 69   Temp 98.1 F (36.7 C)   Ht 5' 3 (1.6 m)   Wt 118 lb 6.4 oz (53.7 kg)   SpO2 98%   BMI 20.97 kg/m chart      Objective:    BP 134/82   Pulse 69   Temp 98.1 F (36.7 C)   Ht 5' 3 (1.6 m)   Wt 118 lb 6.4 oz (53.7 kg)   SpO2 98%   BMI 20.97 kg/m    Physical Exam Vitals reviewed.  Constitutional:      Appearance: Normal appearance. She is normal weight.  Cardiovascular:     Rate and Rhythm: Normal rate and regular rhythm.  Pulmonary:  Effort: Pulmonary effort is normal.     Breath  sounds: Normal breath sounds.  Abdominal:     General: Abdomen is flat.  Musculoskeletal:        General: Tenderness present. No signs of injury.     Right lower leg: No edema.     Left lower leg: No edema.     Comments: Tenderness to palpation of the right lower back extending into the buttocks.  Pain with rotation of the torso and with straight leg raise maneuver.  Skin:    General: Skin is warm and dry.  Neurological:     General: No focal deficit present.     Mental Status: She is alert and oriented to person, place, and time. Mental status is at baseline.     Cranial Nerves: No cranial nerve deficit.     Sensory: No sensory deficit.  Psychiatric:        Mood and Affect: Mood normal.        Behavior: Behavior normal.        Thought Content: Thought content normal.    No results found for any visits on 01/09/24.      Assessment & Plan:   Problem List Items Addressed This Visit   None Visit Diagnoses       Lumbosacral radiculopathy    -  Primary   Relevant Orders   DG Lumbar Spine Complete       Meds ordered this encounter  Medications   meloxicam  (MOBIC ) 7.5 MG tablet    Sig: Take 1 tablet (7.5 mg total) by mouth daily.    Dispense:  30 tablet    Refill:  0   traMADol  (ULTRAM ) 50 MG tablet    Sig: Take 1 tablet (50 mg total) by mouth every 8 (eight) hours as needed for up to 5 days.    Dispense:  15 tablet    Refill:  0   X-ray obtained today will notify patient pending results.  She will likely need an MRI in the future.  However, we will treat with meloxicam  once daily.  Tramadol  as needed for more severe pain.  Declines prednisone .  Call the office if symptoms worsen or persist.  Recheck as scheduled and sooner as needed. No follow-ups on file.  Marvene Strohm B Asanti Craigo, FNP       [1] Allergies Allergen Reactions   Darvocet [Propoxyphene N-Acetaminophen ]     Patient stated that medication made her heart beat fast.   Misc. Sulfonamide Containing Compounds  Other (See Comments)   Morphine Other (See Comments)    morphine   Morphine And Codeine     Patient stated that medication made her heart beat fast.   Sucralfate Nausea Only    congestion  [2] Current Outpatient Medications on File Prior to Visit  Medication Sig Dispense Refill   atorvastatin  (LIPITOR) 10 MG tablet Take 1 tablet (10 mg total) by mouth every other day. 90 tablet 1   Biotin 1000 MCG tablet Take 1,000 mcg by mouth 3 (three) times daily. Reported on 05/13/2015     Cholecalciferol (VITAMIN D  PO) Take by mouth daily. Reported on 05/13/2015     cyanocobalamin (VITAMIN B12) 1000 MCG tablet Take 1,000 mcg by mouth daily.     estradiol  (ESTRACE  VAGINAL) 0.1 MG/GM vaginal cream Apply one applicator q hs x 5 nights and then one applicator 2x week. 42.5 g 2   Magnesium Oxide 250 MG TABS Take 250 mg by mouth daily.  pantoprazole  (PROTONIX ) 40 MG tablet Take 1 tablet (40 mg total) by mouth 2 (two) times daily. 60 tablet 0   sertraline  (ZOLOFT ) 25 MG tablet Take 1 tablet (25 mg total) by mouth daily. (Patient taking differently: Take 25 mg by mouth daily as needed (Anxiety).) 90 tablet 1   Vibegron  (GEMTESA ) 75 MG TABS Take 1 tablet (75 mg total) by mouth daily. 90 tablet 2   Current Facility-Administered Medications on File Prior to Visit  Medication Dose Route Frequency Provider Last Rate Last Admin   betamethasone  acetate-betamethasone  sodium phosphate  (CELESTONE ) injection 3 mg  3 mg Intramuscular Once Evans, Brent M, DPM       betamethasone  acetate-betamethasone  sodium phosphate  (CELESTONE ) injection 3 mg  3 mg Intramuscular Once Janit Thresa HERO, DPM

## 2024-01-09 NOTE — Telephone Encounter (Signed)
 Noted pt has an appt scheduled for today

## 2024-01-11 ENCOUNTER — Telehealth: Payer: Self-pay | Admitting: Internal Medicine

## 2024-01-11 NOTE — Telephone Encounter (Signed)
 Copied from CRM #[id number]. Topic: Clinical - Medical Advice >> Jan 11, 2024 11:06 AM Rea ORN wrote: Reason for CRM: Pt would like to speak to nurse regarding meloxicam  (MOBIC ) 7.5 MG tablet and traMADol  (ULTRAM ) 50 MG tablet. She wants to know she should take these with her other medications. She stated these are strong meds and she wants another opinion.    Please call back [phone]

## 2024-01-11 NOTE — Telephone Encounter (Signed)
 Need to clarify how much pain she is having. If no significant pain, would hold on more pain medication. What is she taking for pain now. Need to clarify symptoms.

## 2024-01-12 ENCOUNTER — Ambulatory Visit: Payer: Self-pay

## 2024-01-12 NOTE — Telephone Encounter (Signed)
 Lvm for pt to return call. Need to know if pt has tried otc meds. Pt was prescribed pain meds from covering provider. Pt can takes these if otc is not helping  Okay to relay

## 2024-01-12 NOTE — Telephone Encounter (Signed)
 Copied from CRM [id number]. Topic: Clinical - Medical Advice >> Jan 12, 2024 11:06 AM Viola F wrote: Reason for CRM: Patient called to follow up on message from yesterday 01/11/24 - Answering to Dr. Freda message, she's taking arthritis tylenol  but she was prescribed meloxicam  (MOBIC ) 7.5 MG tablet and traMADol  (ULTRAM ) 50 MG tablet by Kenney Roys and she's concerned about the side effects. She wants to know Dr. Pete advice on taking the two medications for pain? Please call her at  [phone]

## 2024-01-12 NOTE — Telephone Encounter (Signed)
 FYI Only or Action Required?: FYI only for provider: pt advised on medication instructions and compatibility.  Patient was last seen in primary care on 01/09/2024 by Douglass Kenney NOVAK, FNP.  Called Nurse Triage reporting Medication Problem.   Triage Disposition: Information or Advice Only Call  Patient/caregiver understands and will follow disposition?: Yes   Meloxicam  and tramadol  safe to take together.   Copied from CRM #[id number]. Topic: Clinical - Red Word Triage >> Jan 12, 2024  8:54 AM Victoria A wrote: [facility] that prompted transfer to Nurse Triage: Patient is having pain with her right hip >> Jan 12, 2024 11:14 AM Viola F wrote: Patient called in again regarding concern about side effects of the Meloxicam  and Tramadol  - separate CRM sent to clinic responding to Dr. Freda message from yesterday 01/11/24 >> Jan 12, 2024 10:37 AM Mercedes MATSU wrote: Patient having really bad pain on right hip. Patient was given 2 rx meloxicam  and tramadol  by another doctor and she is nervous to take them.  Reason for Disposition  Caller has medicine question only, adult not sick, AND triager answers question  Answer Assessment - Initial Assessment Questions 1. NAME of MEDICINE: What medicine(s) are you calling about?     Meloxicam  and tramadol   2. QUESTION: What is your question? (e.g., double dose of medicine, side effect)     Pt unsure if she should take meloxicam  and tramadol  together for ongoing hip pain   4. SYMPTOMS: Do you have any symptoms? If Yes, ask: What symptoms are you having?  How bad are the symptoms (e.g., mild, moderate, severe) Ongoing hip pain  Protocols used: Medication Question Call-A-AH

## 2024-01-12 NOTE — Telephone Encounter (Signed)
 Copied from CRM [id number]. Topic: Clinical - Medical Advice >> Jan 12, 2024 11:06 AM Viola F wrote: Reason for CRM: Patient called to follow up on message from yesterday 01/11/24 - Answering to Dr. Freda message, she's taking arthritis tylenol  but she was prescribed meloxicam  (MOBIC ) 7.5 MG tablet and traMADol  (ULTRAM ) 50 MG tablet by Kenney Roys and she's concerned about the side effects. She wants to know Dr. Pete advice on taking the two medications for pain? Please call her at  [phone] >> Jan 12, 2024  3:01 PM Victoria A wrote: Patient said that she will continue to use Tylenol  Arthritis and take prescribed medication if OTC does not help.

## 2024-01-15 ENCOUNTER — Telehealth: Payer: Self-pay | Admitting: Internal Medicine

## 2024-01-15 ENCOUNTER — Encounter: Payer: Self-pay | Admitting: Internal Medicine

## 2024-01-15 NOTE — Telephone Encounter (Signed)
 Lm  and sent letter ; There has been a change in Dr Sanmina-sci schedule for 2026. Your appointment needs to be rescheduled. Please call the office and ask for Darice or [employer]

## 2024-01-16 NOTE — Telephone Encounter (Signed)
 Pt was going to try otc pain medication. Please call and confirm she is doing ok. If she is still having increased pain, I would like for her to see ortho. ( If needed, can go to [facility]. Make sure she knows this is a ortho only walk in clinic ). Also, her xray from 01/09/24 has not been read. Need to call radiology and see if they can read her xray.  Thanks.

## 2024-01-17 NOTE — Telephone Encounter (Signed)
 Tried calling pts home number and mobile number listed both are unavailable and giving a busy ring.

## 2024-01-19 ENCOUNTER — Ambulatory Visit: Payer: Self-pay | Admitting: Family

## 2024-01-23 NOTE — Telephone Encounter (Signed)
See result note,

## 2024-01-24 ENCOUNTER — Ambulatory Visit
Admission: EM | Admit: 2024-01-24 | Discharge: 2024-01-24 | Disposition: A | Discharge status: Home / Self Care | Attending: Emergency Medicine | Admitting: Emergency Medicine

## 2024-01-24 ENCOUNTER — Ambulatory Visit: Payer: Self-pay

## 2024-01-24 DIAGNOSIS — R300 Dysuria: Secondary | ICD-10-CM

## 2024-01-24 LAB — POCT URINE DIPSTICK
Bilirubin, UA: NEGATIVE
Blood, UA: NEGATIVE
Glucose, UA: NEGATIVE mg/dL
Ketones, POC UA: NEGATIVE mg/dL
Leukocytes, UA: NEGATIVE
Nitrite, UA: NEGATIVE
Protein Ur, POC: NEGATIVE mg/dL
Spec Grav, UA: 1.025
Urobilinogen, UA: 0.2 U/dL
pH, UA: 6

## 2024-01-24 NOTE — Discharge Instructions (Addendum)
 Your urine does not show signs of infection at this time.  Follow-up with your primary care provider.

## 2024-01-24 NOTE — Telephone Encounter (Signed)
 FYI Only or Action Required?: Action required by provider: request for appointment.  Patient was last seen in primary care on 01/09/2024 by Douglass Kenney NOVAK, FNP.  Called Nurse Triage reporting Urinary symptoms.  Symptoms began several days ago.  Interventions attempted: Rest, hydration, or home remedies.  Symptoms are: unchanged.Urinary burning. No availability today, will go to UC.  Triage Disposition: See Physician Within 24 Hours  Patient/caregiver understands and will follow disposition?: Yes     Copied from CRM #[id number]. Topic: Clinical - Red Word Triage >> Jan 24, 2024 10:00 AM Rea BROCKS wrote: Red Word that prompted transfer to Nurse Triage: Burning with bladder- not sure if it's her bladder or something else. Answer Assessment - Initial Assessment Questions 1. SYMPTOM: What's the main symptom you're concerned about? (e.g., frequency, incontinence)     burning 2. ONSET: When did the    start?     This week 3. PAIN: Is there any pain? If Yes, ask: How bad is it? (Scale: 1-10; mild, moderate, severe)     7 4. CAUSE: What do you think is causing the symptoms?     UTI 5. OTHER SYMPTOMS: Do you have any other symptoms? (e.g., blood in urine, fever, flank pain, pain with urination)     frequency 6. PREGNANCY: Is there any chance you are pregnant? When was your last menstrual period?     NO  Protocols used: Urinary Symptoms-A-AH  Reason for Disposition  Urinating more frequently than usual (i.e., frequency) OR new-onset of the feeling of an urgent need to urinate (i.e., urgency)  Answer Assessment - Initial Assessment Questions 1. SYMPTOM: What's the main symptom you're concerned about? (e.g., frequency, incontinence)     burning 2. ONSET: When did the    start?     This week 3. PAIN: Is there any pain? If Yes, ask: How bad is it? (Scale: 1-10; mild, moderate, severe)     7 4. CAUSE: What do you think is causing the symptoms?     UTI 5. OTHER  SYMPTOMS: Do you have any other symptoms? (e.g., blood in urine, fever, flank pain, pain with urination)     frequency 6. PREGNANCY: Is there any chance you are pregnant? When was your last menstrual period?     NO  Protocols used: Urinary Symptoms-A-AH

## 2024-01-24 NOTE — ED Triage Notes (Signed)
 Patient to Urgent Care with complaints of dysuria.  Symptoms x2-3 days.

## 2024-01-24 NOTE — Telephone Encounter (Signed)
 FYI

## 2024-01-24 NOTE — ED Provider Notes (Signed)
 " Carrie Noble    CSN: [id number] Arrival date & time: 01/24/24  1118      History   Chief Complaint Chief Complaint  Patient presents with   Dysuria    HPI Carrie Noble is a 83 y.o. female.  Patient presents with 2-day history of dysuria.  No fever, hematuria, abdominal pain, flank pain.  No OTC medication taken today.  Her medical history includes urge incontinence, chronic cystitis, CKD 3.  The history is provided by the patient and medical records.    Past Medical History:  Diagnosis Date   Allergy    Arthritis    Gastroesophageal reflux    Hiatal hernia 1989   status post Nissen fundoplication    Hx of hysterectomy    Hyperlipidemia    Tachycardia    Patient stated that this has been occuring frequently.   Thyroid  disease    Goiter    Patient Active Problem List   Diagnosis Date Noted   S/P laparoscopic cholecystectomy 09/19/2023   Chest discomfort 07/18/2023   Epigastric pain 06/13/2023   Right ankle sprain 05/23/2023   Nocturia 12/06/2022   Joint pain 10/28/2022   Abdominal pain 08/26/2022   Nausea 07/19/2022   Constipation 07/19/2022   Respiratory illness 11/12/2021   Stye 11/12/2021   Thrombocytosis 06/13/2021   Cough 04/13/2021   History of wheezing 04/13/2021   Seasonal allergies 04/13/2021   Dysuria 12/07/2020   CKD (chronic kidney disease) stage 3, GFR 30-59 ml/min (HCC) 08/01/2020   Aortic atherosclerosis 08/01/2020   Hyperglycemia 07/22/2020   Open wound 04/25/2020   Leg pain 04/25/2020   Dry mouth 02/12/2020   Fullness of breast 09/30/2019   Weight loss 09/30/2019   Pelvic pain 03/17/2018   Osteoarthritis 12/13/2017   Osteoporosis, 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   Chest pain 12/03/2017   Vitamin D  deficiency 11/30/2017   Leg weakness 11/30/2017   Benign essential HTN 10/24/2016   Stress 09/04/2016   Urge incontinence 09/04/2016   Headache 02/16/2016   Dry eyes 02/16/2016   Memory change 10/11/2015   Allergic rhinitis  05/06/2015   Breast tenderness in female 09/14/2014   Pain in right hip 07/14/2014   Carotid bruit 07/14/2014   Difficulty sleeping 03/30/2014   Health care maintenance 03/30/2014   Urinary frequency 12/29/2013   Diverticulosis 03/27/2013   Chronic cystitis 12/31/2012   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   Abnormal mammogram 09/06/2012   Osteoporosis 05/29/2012   Thyroid  nodule 05/28/2012   Barrett's esophagus 05/28/2012   Hypercholesterolemia 01/03/2011   Palpitations 01/03/2011   GERD (gastroesophageal reflux disease) 01/03/2011    Past Surgical History:  Procedure Laterality Date   ABDOMINAL HYSTERECTOMY  1980   partial   blepheroplasty b/l eyes Bilateral    BREAST BIOPSY Right    neg   BREAST EXCISIONAL BIOPSY Left 2014   neg   COLONOSCOPY  2015   ESOPHAGOGASTRIC FUNDOPLICATION  1999   LAPAROSCOPIC NISSEN FUNDOPLICATION  1999   TONSILLECTOMY     as well as goiter   UPPER GI ENDOSCOPY  2015    OB History     Gravida  2   Para  2   Term      Preterm      AB      Living  2      SAB      IAB      Ectopic      Multiple      Live Births  Obstetric Comments  Menstrual age: 66  Age 1st Pregnancy: 58          Home Medications    Prior to Admission medications  Medication Sig Start Date End Date Taking? Authorizing Provider  atorvastatin  (LIPITOR) 10 MG tablet Take 1 tablet (10 mg total) by mouth every other day. 08/24/23   Glendia Shad, MD  Biotin 1000 MCG tablet Take 1,000 mcg by mouth 3 (three) times daily. Reported on 05/13/2015    [provider]  Cholecalciferol (VITAMIN D  PO) Take by mouth daily. Reported on 05/13/2015    [provider]  cyanocobalamin (VITAMIN B12) 1000 MCG tablet Take 1,000 mcg by mouth daily.    [provider]  estradiol  (ESTRACE  VAGINAL) 0.1 MG/GM vaginal cream Apply one applicator q hs x 5 nights and then one applicator 2x week. 06/13/23   Glendia Shad, MD  Magnesium Oxide 250 MG TABS Take 250 mg by mouth daily.    [provider]  meloxicam  (MOBIC ) 7.5 MG tablet Take 1 tablet (7.5 mg total) by mouth daily. 01/09/24   Webb, Padonda B, FNP  pantoprazole  (PROTONIX ) 40 MG tablet Take 1 tablet (40 mg total) by mouth 2 (two) times daily. 01/01/24   Dineen Rollene MATSU, FNP  sertraline  (ZOLOFT ) 25 MG tablet Take 1 tablet (25 mg total) by mouth daily. Patient taking differently: Take 25 mg by mouth daily as needed (Anxiety). 06/13/23   Glendia Shad, MD  Vibegron  (GEMTESA ) 75 MG TABS Take 1 tablet (75 mg total) by mouth daily. 06/13/23   Glendia Shad, MD    Family History Family History  Problem Relation Age of Onset   Stroke Mother 15   Arthritis Mother    Heart disease Mother    Hypertension Mother    Stroke Father 66   Hypertension Father    Rheumatic fever Brother        and multiple open heart surgeries   Diabetes Brother        type 2   Stroke Brother    Other Sister        Coronary Atherosclerosis   Stroke Brother    Stroke Sister    Heart disease Sister    Dementia Sister    Cancer Sister        ovarian   Breast cancer Neg Hx     Social History Social History[1]   Allergies   Darvocet [propoxyphene n-acetaminophen ], Misc. sulfonamide containing compounds, Morphine, Morphine and codeine, and Sucralfate   Review of Systems Review of Systems  Constitutional:  Negative for chills and fever.  Gastrointestinal:  Negative for abdominal pain.  Genitourinary:  Positive for dysuria. Negative for flank pain and hematuria.     Physical Exam Triage Vital Signs ED Triage Vitals  Encounter Vitals Group     BP 01/24/24 1148 129/79     Girls Systolic BP Percentile --      Girls Diastolic BP Percentile --      Boys Systolic BP Percentile --      Boys Diastolic BP Percentile --      Pulse Rate 01/24/24 1148 78     Resp 01/24/24 1148 19     Temp 01/24/24 1148 98 F (36.7 C)     Temp src --      SpO2  01/24/24 1148 98 %     Weight --      Height --      Head Circumference --      Peak Flow --  Pain Score 01/24/24 1145 2     Pain Loc --      Pain Education --      Exclude from Growth Chart --    No data found.  Updated Vital Signs BP 129/79   Pulse 78   Temp 98 F (36.7 C)   Resp 19   SpO2 98%   Visual Acuity Right Eye Distance:   Left Eye Distance:   Bilateral Distance:    Right Eye Near:   Left Eye Near:    Bilateral Near:     Physical Exam Constitutional:      General: She is not in acute distress. HENT:     Mouth/Throat:     Mouth: Mucous membranes are moist.  Cardiovascular:     Rate and Rhythm: Normal rate and regular rhythm.     Heart sounds: Normal heart sounds.  Pulmonary:     Effort: Pulmonary effort is normal. No respiratory distress.     Breath sounds: Normal breath sounds.  Abdominal:     General: Bowel sounds are normal.     Palpations: Abdomen is soft.     Tenderness: There is no abdominal tenderness. There is no right CVA tenderness, left CVA tenderness, guarding or rebound.  Neurological:     Mental Status: She is alert.      UC Treatments / Results  Labs (all labs ordered are listed, but only abnormal results are displayed) Labs Reviewed  POCT URINE DIPSTICK - Normal    EKG   Radiology No results found.  Procedures Procedures (including critical care time)  Medications Ordered in UC Medications - No data to display  Initial Impression / Assessment and Plan / UC Course  I have reviewed the triage vital signs and the nursing notes.  Pertinent labs & imaging results that were available during my care of the patient were reviewed by me and considered in my medical decision making (see chart for details).    Dysuria.  Afebrile and vital signs are stable.  Urine is normal at this time.  Instructed patient to follow-up with her PCP.  Education provided on dysuria.  She agrees to plan of care.  Final Clinical Impressions(s)  / UC Diagnoses   Final diagnoses:  Dysuria     Discharge Instructions      Your urine does not show signs of infection at this time.  Follow-up with your primary care provider.     ED Prescriptions   None    PDMP not reviewed this encounter.    [1]  Social History Tobacco Use   Smoking status: Never   Smokeless tobacco: Never  Substance Use Topics   Alcohol use: No    Alcohol/week: 0.0 standard drinks of alcohol   Drug use: No     Corlis Burnard DEL, NP 01/24/24 1216  "

## 2024-01-28 ENCOUNTER — Other Ambulatory Visit: Payer: Self-pay | Admitting: Internal Medicine

## 2024-01-28 DIAGNOSIS — R682 Dry mouth, unspecified: Secondary | ICD-10-CM

## 2024-01-28 DIAGNOSIS — H04123 Dry eye syndrome of bilateral lacrimal glands: Secondary | ICD-10-CM

## 2024-01-28 NOTE — Progress Notes (Signed)
Order placed for rheumatology referral.

## 2024-01-31 ENCOUNTER — Other Ambulatory Visit (INDEPENDENT_AMBULATORY_CARE_PROVIDER_SITE_OTHER)

## 2024-01-31 DIAGNOSIS — E7800 Pure hypercholesterolemia, unspecified: Secondary | ICD-10-CM | POA: Diagnosis not present

## 2024-01-31 DIAGNOSIS — R739 Hyperglycemia, unspecified: Secondary | ICD-10-CM | POA: Diagnosis not present

## 2024-01-31 DIAGNOSIS — R1033 Periumbilical pain: Secondary | ICD-10-CM

## 2024-01-31 LAB — CBC WITH DIFFERENTIAL/PLATELET
Basophils Absolute: 0.1 K/uL (ref 0.0–0.1)
Basophils Relative: 1.1 % (ref 0.0–3.0)
Eosinophils Absolute: 0.2 K/uL (ref 0.0–0.7)
Eosinophils Relative: 4.1 % (ref 0.0–5.0)
HCT: 40 % (ref 36.0–46.0)
Hemoglobin: 13.6 g/dL (ref 12.0–15.0)
Lymphocytes Relative: 36.6 % (ref 12.0–46.0)
Lymphs Abs: 2.1 K/uL (ref 0.7–4.0)
MCHC: 34.1 g/dL (ref 30.0–36.0)
MCV: 93.9 fl (ref 78.0–100.0)
Monocytes Absolute: 0.4 K/uL (ref 0.1–1.0)
Monocytes Relative: 7.6 % (ref 3.0–12.0)
Neutro Abs: 3 K/uL (ref 1.4–7.7)
Neutrophils Relative %: 50.6 % (ref 43.0–77.0)
Platelets: 365 K/uL (ref 150.0–400.0)
RBC: 4.26 Mil/uL (ref 3.87–5.11)
RDW: 14.3 % (ref 11.5–15.5)
WBC: 5.9 K/uL (ref 4.0–10.5)

## 2024-01-31 LAB — BASIC METABOLIC PANEL WITH GFR
BUN: 21 mg/dL (ref 6–23)
CO2: 27 meq/L (ref 19–32)
Calcium: 9.3 mg/dL (ref 8.4–10.5)
Chloride: 102 meq/L (ref 96–112)
Creatinine, Ser: 1.01 mg/dL (ref 0.40–1.20)
GFR: 51.04 mL/min — ABNORMAL LOW
Glucose, Bld: 95 mg/dL (ref 70–99)
Potassium: 4.2 meq/L (ref 3.5–5.1)
Sodium: 137 meq/L (ref 135–145)

## 2024-01-31 LAB — LIPID PANEL
Cholesterol: 211 mg/dL — ABNORMAL HIGH (ref 28–200)
HDL: 76.2 mg/dL
LDL Cholesterol: 113 mg/dL — ABNORMAL HIGH (ref 10–99)
NonHDL: 134.83
Total CHOL/HDL Ratio: 3
Triglycerides: 109 mg/dL (ref 10.0–149.0)
VLDL: 21.8 mg/dL (ref 0.0–40.0)

## 2024-01-31 LAB — HEMOGLOBIN A1C: Hgb A1c MFr Bld: 5.4 % (ref 4.6–6.5)

## 2024-01-31 LAB — HEPATIC FUNCTION PANEL
ALT: 13 U/L (ref 3–35)
AST: 23 U/L (ref 5–37)
Albumin: 4.6 g/dL (ref 3.5–5.2)
Alkaline Phosphatase: 97 U/L (ref 39–117)
Bilirubin, Direct: 0.1 mg/dL (ref 0.1–0.3)
Total Bilirubin: 0.7 mg/dL (ref 0.2–1.2)
Total Protein: 7.3 g/dL (ref 6.0–8.3)

## 2024-02-01 ENCOUNTER — Ambulatory Visit: Payer: Self-pay | Admitting: Internal Medicine

## 2024-02-02 ENCOUNTER — Encounter
Admission: RE | Disposition: A | Discharge status: Home / Self Care | Payer: Self-pay | Source: Home / Self Care | Attending: Gastroenterology

## 2024-02-02 ENCOUNTER — Ambulatory Visit: Admitting: Anesthesiology

## 2024-02-02 ENCOUNTER — Ambulatory Visit
Admission: RE | Admit: 2024-02-02 | Discharge: 2024-02-02 | Disposition: A | Discharge status: Home / Self Care | Attending: Gastroenterology | Admitting: Gastroenterology

## 2024-02-02 DIAGNOSIS — F32A Depression, unspecified: Secondary | ICD-10-CM | POA: Insufficient documentation

## 2024-02-02 DIAGNOSIS — K2970 Gastritis, unspecified, without bleeding: Secondary | ICD-10-CM | POA: Diagnosis not present

## 2024-02-02 DIAGNOSIS — Z79899 Other long term (current) drug therapy: Secondary | ICD-10-CM | POA: Insufficient documentation

## 2024-02-02 DIAGNOSIS — K219 Gastro-esophageal reflux disease without esophagitis: Secondary | ICD-10-CM | POA: Insufficient documentation

## 2024-02-02 DIAGNOSIS — I10 Essential (primary) hypertension: Secondary | ICD-10-CM | POA: Insufficient documentation

## 2024-02-02 DIAGNOSIS — Z9889 Other specified postprocedural states: Secondary | ICD-10-CM | POA: Diagnosis not present

## 2024-02-02 DIAGNOSIS — K449 Diaphragmatic hernia without obstruction or gangrene: Secondary | ICD-10-CM | POA: Diagnosis not present

## 2024-02-02 DIAGNOSIS — E785 Hyperlipidemia, unspecified: Secondary | ICD-10-CM | POA: Insufficient documentation

## 2024-02-02 HISTORY — PX: ESOPHAGOGASTRODUODENOSCOPY: SHX5428

## 2024-02-02 HISTORY — PX: BIOPSY OF SKIN SUBCUTANEOUS TISSUE AND/OR MUCOUS MEMBRANE: SHX6741

## 2024-02-02 SURGERY — EGD (ESOPHAGOGASTRODUODENOSCOPY)
Anesthesia: General

## 2024-02-02 MED ORDER — SODIUM CHLORIDE 0.9 % IV SOLN: INTRAVENOUS | Status: DC

## 2024-02-02 MED ORDER — LIDOCAINE HCL (PF) 2 % IJ SOLN
INTRAMUSCULAR | Status: AC
  Filled 2024-02-02: qty 5

## 2024-02-02 MED ORDER — PROPOFOL 500 MG/50ML IV EMUL
INTRAVENOUS | Status: DC | PRN
  Administered 2024-02-02: 50 ug/kg/min via INTRAVENOUS

## 2024-02-02 MED ORDER — PROPOFOL 10 MG/ML IV BOLUS
INTRAVENOUS | Status: DC | PRN
  Administered 2024-02-02: 20 mg via INTRAVENOUS
  Administered 2024-02-02: 30 mg via INTRAVENOUS

## 2024-02-02 MED ORDER — LIDOCAINE HCL (CARDIAC) PF 100 MG/5ML IV SOSY
PREFILLED_SYRINGE | INTRAVENOUS | Status: DC | PRN
  Administered 2024-02-02: 50 mg via INTRAVENOUS

## 2024-02-02 MED ORDER — GLYCOPYRROLATE 0.2 MG/ML IJ SOLN
INTRAMUSCULAR | Status: AC
  Filled 2024-02-02: qty 1

## 2024-02-02 NOTE — Interval H&P Note (Signed)
 History and Physical Interval Note:  02/02/2024 10:46 AM  Carrie Noble  has presented today for surgery, with the diagnosis of gerd,.  The various methods of treatment have been discussed with the patient and family. After consideration of risks, benefits and other options for treatment, the patient has consented to  Procedures: EGD (ESOPHAGOGASTRODUODENOSCOPY) (N/A) as a surgical intervention.  The patient's history has been reviewed, patient examined, no change in status, stable for surgery.  I have reviewed the patient's chart and labs.  Questions were answered to the patient's satisfaction.     Carrie Noble  Ok to proceed with EGD

## 2024-02-02 NOTE — H&P (Signed)
 Outpatient short stay form Pre-procedure 02/02/2024  Ole ONEIDA Schick, MD  Primary Physician: Glendia Shad, MD  Reason for visit:  GERD  History of present illness:    84 y/o lady with history of HLD, depression, and GERD here for EGD for GERD not responsive to PPI. Symptoms sound like esophageal spasm. No blood thinners. History of fundoplication. No neck surgeries. No family history of GI malignancies.   Current Medications[1]  Facility-Administered Medications Prior to Admission  Medication Dose Route Frequency Provider Last Rate Last Admin   betamethasone  acetate-betamethasone  sodium phosphate  (CELESTONE ) injection 3 mg  3 mg Intramuscular Once Janit Mcardle M, DPM       betamethasone  acetate-betamethasone  sodium phosphate  (CELESTONE ) injection 3 mg  3 mg Intramuscular Once Evans, Brent M, DPM       Medications Prior to Admission  Medication Sig Dispense Refill Last Dose/Taking   atorvastatin  (LIPITOR) 10 MG tablet Take 1 tablet (10 mg total) by mouth every other day. 90 tablet 1 02/01/2024   pantoprazole  (PROTONIX ) 40 MG tablet Take 1 tablet (40 mg total) by mouth 2 (two) times daily. 60 tablet 0 Past Week   sertraline  (ZOLOFT ) 25 MG tablet Take 1 tablet (25 mg total) by mouth daily. (Patient taking differently: Take 25 mg by mouth daily as needed (Anxiety).) 90 tablet 1 Past Week   Vibegron  (GEMTESA ) 75 MG TABS Take 1 tablet (75 mg total) by mouth daily. 90 tablet 2 02/01/2024   Biotin 1000 MCG tablet Take 1,000 mcg by mouth 3 (three) times daily. Reported on 05/13/2015      Cholecalciferol (VITAMIN D  PO) Take by mouth daily. Reported on 05/13/2015      cyanocobalamin (VITAMIN B12) 1000 MCG tablet Take 1,000 mcg by mouth daily.      estradiol  (ESTRACE  VAGINAL) 0.1 MG/GM vaginal cream Apply one applicator q hs x 5 nights and then one applicator 2x week. 42.5 g 2    Magnesium Oxide 250 MG TABS Take 250 mg by mouth daily.      meloxicam  (MOBIC ) 7.5 MG tablet Take 1 tablet (7.5 mg total)  by mouth daily. 30 tablet 0      Allergies[2]   Past Medical History:  Diagnosis Date   Allergy    Arthritis    Gastroesophageal reflux    Hiatal hernia 1989   status post Nissen fundoplication    Hx of hysterectomy    Hyperlipidemia    Tachycardia    Patient stated that this has been occuring frequently.   Thyroid  disease    Goiter    Review of systems:  Otherwise negative.    Physical Exam  Gen: Alert, oriented. Appears stated age.  HEENT: PERRLA. Lungs: No respiratory distress CV: RRR Abd: soft, benign, no masses Ext: No edema    Planned procedures: Proceed with EGD. The patient understands the nature of the planned procedure, indications, risks, alternatives and potential complications including but not limited to bleeding, infection, perforation, damage to internal organs and possible oversedation/side effects from anesthesia. The patient agrees and gives consent to proceed.  Please refer to procedure notes for findings, recommendations and patient disposition/instructions.     Ole ONEIDA Schick, MD [facility]         [1]  Current Facility-Administered Medications:    0.9 %  sodium chloride  infusion, , Intravenous, Continuous, Deleah Tison, Ole ONEIDA, MD, Last Rate: 20 mL/hr at 02/02/24 1029, New Bag at 02/02/24 1029 [2]  Allergies Allergen Reactions   Darvocet [Propoxyphene N-Acetaminophen ]  Patient stated that medication made her heart beat fast.   Misc. Sulfonamide Containing Compounds Other (See Comments)   Morphine Other (See Comments)    morphine   Morphine And Codeine     Patient stated that medication made her heart beat fast.   Sucralfate Nausea Only    congestion

## 2024-02-02 NOTE — Anesthesia Preprocedure Evaluation (Signed)
"                                    Anesthesia Evaluation  Patient identified by MRN, date of birth, ID band Patient awake    Reviewed: Allergy & Precautions, NPO status , Patient's Chart, lab work & pertinent test results  Airway Mallampati: III  TM Distance: <3 FB Neck ROM: Full    Dental  (+) Teeth Intact   Pulmonary neg pulmonary ROS   Pulmonary exam normal        Cardiovascular Exercise Tolerance: Good hypertension, Pt. on medications negative cardio ROS Normal cardiovascular exam Rhythm:Regular Rate:Normal     Neuro/Psych  Headaches negative neurological ROS  negative psych ROS   GI/Hepatic negative GI ROS, Neg liver ROS, hiatal hernia,GERD  Medicated,,  Endo/Other  negative endocrine ROS    Renal/GU negative Renal ROS  negative genitourinary   Musculoskeletal  (+) Arthritis ,    Abdominal Normal abdominal exam  (+)   Peds negative pediatric ROS (+)  Hematology negative hematology ROS (+)   Anesthesia Other Findings Past Medical History: No date: Allergy No date: Arthritis No date: Gastroesophageal reflux 1989: Hiatal hernia     Comment:  status post Nissen fundoplication  No date: Hx of hysterectomy No date: Hyperlipidemia No date: Tachycardia     Comment:  Patient stated that this has been occuring frequently. No date: Thyroid  disease     Comment:  Goiter  Past Surgical History: 1980: ABDOMINAL HYSTERECTOMY     Comment:  partial No date: blepheroplasty b/l eyes; Bilateral No date: BREAST BIOPSY; Right     Comment:  neg 2014: BREAST EXCISIONAL BIOPSY; Left     Comment:  neg 2015: COLONOSCOPY 1999: ESOPHAGOGASTRIC FUNDOPLICATION 1999: LAPAROSCOPIC NISSEN FUNDOPLICATION No date: TONSILLECTOMY     Comment:  as well as goiter 2015: UPPER GI ENDOSCOPY  BMI    Body Mass Index: 20.73 kg/m      Reproductive/Obstetrics negative OB ROS                              Anesthesia Physical Anesthesia  Plan  ASA: 2  Anesthesia Plan: General   Post-op Pain Management:    Induction: Intravenous  PONV Risk Score and Plan: Propofol  infusion and TIVA  Airway Management Planned: Natural Airway and Nasal Cannula  Additional Equipment:   Intra-op Plan:   Post-operative Plan:   Informed Consent: I have reviewed the patients History and Physical, chart, labs and discussed the procedure including the risks, benefits and alternatives for the proposed anesthesia with the patient or authorized representative who has indicated his/her understanding and acceptance.     Dental Advisory Given  Plan Discussed with: CRNA  Anesthesia Plan Comments:         Anesthesia Quick Evaluation  "

## 2024-02-02 NOTE — Anesthesia Postprocedure Evaluation (Signed)
"   Anesthesia Post Note  Patient: Carrie Noble  Procedure(s) Performed: EGD (ESOPHAGOGASTRODUODENOSCOPY)  Patient location during evaluation: PACU Anesthesia Type: General Level of consciousness: awake and alert Pain management: pain level controlled Vital Signs Assessment: post-procedure vital signs reviewed and stable Respiratory status: spontaneous breathing Cardiovascular status: stable Anesthetic complications: no   No notable events documented.   Last Vitals:  Vitals:   02/02/24 1119 02/02/24 1127  BP: (!) 153/94 (!) 161/92  Pulse: 80 77  Resp: 16 16  Temp:    SpO2: 100% 99%    Last Pain:  Vitals:   02/02/24 1127  TempSrc:   PainSc: 0-No pain                 VAN STAVEREN,Takerra Lupinacci      "

## 2024-02-02 NOTE — Op Note (Signed)
 [facility] Patient Name: Carrie Noble Procedure Date: 02/02/2024 10:48 AM MRN: [id number] Account #: [id number] Date of Birth: 18-Feb-1940 Admit Type: Outpatient Age: 84 Room: [facility] ENDO ROOM 3 Gender: Female Note Status: Finalized Instrument Name: Upper GI Scope [id number] Procedure:             Upper GI endoscopy Indications:           Gastro-esophageal reflux disease Providers:             Ole Schick MD, MD Referring MD:          Allena Hamilton, MD (Referring MD) Medicines:             Monitored Anesthesia Care Complications:         No immediate complications. Estimated blood loss:                         Minimal. Procedure:             Pre-Anesthesia Assessment:                        - Prior to the procedure, a History and Physical was                         performed, and patient medications and allergies were                         reviewed. The patient is competent. The risks and                         benefits of the procedure and the sedation options and                         risks were discussed with the patient. All questions                         were answered and informed consent was obtained.                         Patient identification and proposed procedure were                         verified by the physician, the nurse, the                         anesthesiologist, the anesthetist and the technician                         in the endoscopy suite. Mental Status Examination:                         alert and oriented. Airway Examination: normal                         oropharyngeal airway and neck mobility. Respiratory                         Examination: clear to auscultation. CV Examination:  normal. Prophylactic Antibiotics: The patient does not                         require prophylactic antibiotics. Prior                         Anticoagulants: The patient has taken no anticoagulant                          or antiplatelet agents. ASA Grade Assessment: II - A                         patient with mild systemic disease. After reviewing                         the risks and benefits, the patient was deemed in                         satisfactory condition to undergo the procedure. The                         anesthesia plan was to use monitored anesthesia care                         (MAC). Immediately prior to administration of                         medications, the patient was re-assessed for adequacy                         to receive sedatives. The heart rate, respiratory                         rate, oxygen saturations, blood pressure, adequacy of                         pulmonary ventilation, and response to care were                         monitored throughout the procedure. The physical                         status of the patient was re-assessed after the                         procedure.                        After obtaining informed consent, the endoscope was                         passed under direct vision. Throughout the procedure,                         the patient's blood pressure, pulse, and oxygen                         saturations were monitored continuously. The Endoscope  was introduced through the mouth, and advanced to the                         second part of duodenum. The upper GI endoscopy was                         accomplished without difficulty. The patient tolerated                         the procedure well. Findings:      The examined esophagus was normal.      A 2 cm hiatal hernia was present.      Evidence of a Nissen fundoplication was found at the gastroesophageal       junction. The wrap appeared loose.      Patchy mild inflammation characterized by erythema was found in the       gastric antrum. Biopsies were taken with a cold forceps for Helicobacter       pylori testing. Estimated blood loss was minimal.       The examined duodenum was normal. Impression:            - Normal esophagus.                        - 2 cm hiatal hernia.                        - A Nissen fundoplication was found. The wrap appears                         loose.                        - Gastritis. Biopsied.                        - Normal examined duodenum. Recommendation:        - Discharge patient to home.                        - Resume previous diet.                        - Continue present medications.                        - Await pathology results.                        - Return to referring physician as previously                         scheduled. Procedure Code(s):     --- Professional ---                        [id number], Esophagogastroduodenoscopy, flexible,                         transoral; with biopsy, single or multiple Diagnosis Code(s):     --- Professional ---  K44.9, Diaphragmatic hernia without obstruction or                         gangrene                        Z98.890, Other specified postprocedural states                        K29.70, Gastritis, unspecified, without bleeding                        K21.9, Gastro-esophageal reflux disease without                         esophagitis CPT copyright 2022 American [employer] Association. All rights reserved. The codes documented in this report are preliminary and upon coder review may  be revised to meet current compliance requirements. Ole Schick MD, MD 02/02/2024 11:07:55 AM Number of Addenda: 0 Note Initiated On: 02/02/2024 10:48 AM Estimated Blood Loss:  Estimated blood loss was minimal.      [facility]

## 2024-02-02 NOTE — Transfer of Care (Signed)
 Immediate Anesthesia Transfer of Care Note  Patient: Carrie Noble  Procedure(s) Performed: EGD (ESOPHAGOGASTRODUODENOSCOPY)  Patient Location: PACU  Anesthesia Type:General  Level of Consciousness: sedated  Airway & Oxygen Therapy: Patient Spontanous Breathing  Post-op Assessment: Report given to RN and Post -op Vital signs reviewed and stable  Post vital signs: Reviewed and stable  Last Vitals:  Vitals Value Taken Time  BP    Temp    Pulse    Resp    SpO2      Last Pain:  Vitals:   02/02/24 1023  TempSrc: Temporal         Complications: No notable events documented.

## 2024-02-05 LAB — SURGICAL PATHOLOGY

## 2024-02-06 ENCOUNTER — Other Ambulatory Visit

## 2024-02-06 ENCOUNTER — Ambulatory Visit (INDEPENDENT_AMBULATORY_CARE_PROVIDER_SITE_OTHER): Admitting: Internal Medicine

## 2024-02-06 ENCOUNTER — Encounter: Payer: Self-pay | Admitting: Internal Medicine

## 2024-02-06 VITALS — BP 136/80 | HR 82 | Temp 97.8°F | Ht 63.0 in | Wt 118.0 lb

## 2024-02-06 DIAGNOSIS — R1033 Periumbilical pain: Secondary | ICD-10-CM | POA: Diagnosis not present

## 2024-02-06 DIAGNOSIS — K5900 Constipation, unspecified: Secondary | ICD-10-CM | POA: Diagnosis not present

## 2024-02-06 DIAGNOSIS — E041 Nontoxic single thyroid nodule: Secondary | ICD-10-CM

## 2024-02-06 DIAGNOSIS — R739 Hyperglycemia, unspecified: Secondary | ICD-10-CM

## 2024-02-06 DIAGNOSIS — E7800 Pure hypercholesterolemia, unspecified: Secondary | ICD-10-CM

## 2024-02-06 DIAGNOSIS — M25551 Pain in right hip: Secondary | ICD-10-CM | POA: Diagnosis not present

## 2024-02-06 DIAGNOSIS — I10 Essential (primary) hypertension: Secondary | ICD-10-CM | POA: Diagnosis not present

## 2024-02-06 DIAGNOSIS — N1831 Chronic kidney disease, stage 3a: Secondary | ICD-10-CM

## 2024-02-06 DIAGNOSIS — K2270 Barrett's esophagus without dysplasia: Secondary | ICD-10-CM

## 2024-02-06 DIAGNOSIS — F439 Reaction to severe stress, unspecified: Secondary | ICD-10-CM | POA: Diagnosis not present

## 2024-02-06 DIAGNOSIS — K219 Gastro-esophageal reflux disease without esophagitis: Secondary | ICD-10-CM

## 2024-02-06 MED ORDER — SERTRALINE HCL 25 MG PO TABS: ORAL_TABLET | ORAL | 1 refills | Status: AC

## 2024-02-06 NOTE — Patient Instructions (Signed)
 Tylenol  arthritis - 2 tablets twice a day as needed  Take zoloft  25mg  1/2 tablet per day.   Stop meloxicam .   Miralax  - daily as needed for constipation.   Take protonix  twice a day.

## 2024-02-06 NOTE — Progress Notes (Signed)
 "  Subjective:    Patient ID: Carrie Noble, female    DOB: 12-Mar-1940, 84 y.o.   MRN: [id number]  Patient here for  Chief Complaint  Patient presents with   Medical Management of Chronic Issues   Leg Pain    HPI Here for a scheduled follow up. Is s/p lap cholecystectomy 09/04/23. Was having some increased GERD post surgery. Placed on protonix  and pepcid . Had f/u with cardiology 07/21/23 - metoprolol for palpitations. Had f/u Dr Florencio 08/21/23 - no changes made. F/u Dr MALVATHEORA Laundry - thyroid  nodule. Last 05/2021 - f/u 2 years. Had f/u 06/2023 - nodule a little bigger -recommended f/u in 1 year. Evaluated 01/01/24 - acute visit - symptoms appeared to be c/w GERD. Placed on protonix  bid. Evaluated 01/09/24 - right hip pain and leg pain. Xray lumbar spine - mild degenerative changes lumbar spine. Saw GI 01/09/24 - recommended EGD and referral for esophageal manometry/pH testing. Also, recommended f/u for anxiety. Evaluated 01/24/24 -urine ok. EGD 02/02/24 - nissen fundoplication - warp appeared loose, hiatal hernia and gastritis. Discussed taking protonix  daily in am and pepcid  in pm. Some constipation. No swallowing issues reported today. Has been taking meloxicam . Discussed possible side effects and minimizing amount of meloxicam  - given GI issues and her issues she has had with varying blood pressures. Right hip pain/right leg pain. Worse if has been sitting or lying for a while. Once up and moving - better. Some pain - palpation - right hip.    Past Medical History:  Diagnosis Date   Allergy    Arthritis    Gastroesophageal reflux    Hiatal hernia 1989   status post Nissen fundoplication    Hx of hysterectomy    Hyperlipidemia    Tachycardia    Patient stated that this has been occuring frequently.   Thyroid  disease    Goiter   Past Surgical History:  Procedure Laterality Date   ABDOMINAL HYSTERECTOMY  1980   partial   BIOPSY OF SKIN SUBCUTANEOUS TISSUE AND/OR MUCOUS MEMBRANE  02/02/2024    Procedure: BIOPSY, GI;  Surgeon: Maryruth Ole DASEN, MD;  Location: [facility] ENDOSCOPY;  Service: Endoscopy;;   blepheroplasty b/l eyes Bilateral    BREAST BIOPSY Right    neg   BREAST EXCISIONAL BIOPSY Left 2014   neg   COLONOSCOPY  2015   ESOPHAGOGASTRIC FUNDOPLICATION  1999   ESOPHAGOGASTRODUODENOSCOPY N/A 02/02/2024   Procedure: EGD (ESOPHAGOGASTRODUODENOSCOPY);  Surgeon: Maryruth Ole DASEN, MD;  Location: [facility] ENDOSCOPY;  Service: Endoscopy;  Laterality: N/A;   LAPAROSCOPIC NISSEN FUNDOPLICATION  1999   TONSILLECTOMY     as well as goiter   UPPER GI ENDOSCOPY  2015   Family History  Problem Relation Age of Onset   Stroke Mother 48   Arthritis Mother    Heart disease Mother    Hypertension Mother    Stroke Father 10   Hypertension Father    Rheumatic fever Brother        and multiple open heart surgeries   Diabetes Brother        type 2   Stroke Brother    Other Sister        Coronary Atherosclerosis   Stroke Brother    Stroke Sister    Heart disease Sister    Dementia Sister    Cancer Sister        ovarian   Breast cancer Neg Hx    Social History   Socioeconomic History   Marital status:  Married    Spouse name: Not on file   Number of children: Not on file   Years of education: Not on file   Highest education level: Not on file  Occupational History   Occupation: Retired    Associate Professor: [employer]  Tobacco Use   Smoking status: Never   Smokeless tobacco: Never  Vaping Use   Vaping status: Never Used  Substance and Sexual Activity   Alcohol use: No    Alcohol/week: 0.0 standard drinks of alcohol   Drug use: No   Sexual activity: Never  Other Topics Concern   Not on file  Social History Narrative   Occasionally drinks coffee.   Social Drivers of Health   Tobacco Use: Low Risk (02/11/2024)   Patient History    Smoking Tobacco Use: Never    Smokeless Tobacco Use: Never    Passive Exposure: Not on file  Financial Resource Strain: Low Risk  (07/06/2023)    Received from [facility]   Overall Financial Resource Strain (CARDIA)    Difficulty of Paying Living Expenses: Not hard at all  Food Insecurity: No Food Insecurity (07/06/2023)   Received from [facility]   Epic    Within the past 12 months, you worried that your food would run out before you got the money to buy more.: Never true    Within the past 12 months, the food you bought just didn't last and you didn't have money to get more.: Never true  Transportation Needs: No Transportation Needs (07/06/2023)   Received from [facility] - Transportation    In the past 12 months, has lack of transportation kept you from medical appointments or from getting medications?: No    Lack of Transportation (Non-Medical): No  Physical Activity: Insufficiently Active (06/17/2021)   Exercise Vital Sign    Days of Exercise per Week: 7 days    Minutes of Exercise per Session: 20 min  Stress: No Stress Concern Present (06/17/2021)   [employer] - Occupational Stress Questionnaire    Feeling of Stress : Not at all  Social Connections: Not on file  Depression (PHQ2-9): Low Risk (01/09/2024)   Depression (PHQ2-9)    PHQ-2 Score: 0  Alcohol Screen: Not on file  Housing: Low Risk  (07/06/2023)   Received from [facility]   Epic    In the last 12 months, was there a time when you were not able to pay the mortgage or rent on time?: No    In the past 12 months, how many times have you moved where you were living?: 0    At any time in the past 12 months, were you homeless or living in a shelter (including now)?: No  Utilities: Not At Risk (07/06/2023)   Received from [facility]   Epic    In the past 12 months has the electric, gas, oil, or water company threatened to shut off services in your home?: No  Health Literacy: Not on file     Review of Systems  Constitutional:  Negative  for appetite change and unexpected weight change.  HENT:  Negative for congestion and sinus pressure.   Respiratory:  Negative for cough, chest tightness and shortness of breath.   Cardiovascular:  Negative for chest pain, palpitations and leg swelling.  Gastrointestinal:  Positive for constipation. Negative for nausea and vomiting.  Genitourinary:  Negative for difficulty urinating and dysuria.  Musculoskeletal:  Negative for myalgias.       Increased pain - right hip and right leg as outined.   Skin:  Negative for color change and rash.  Neurological:  Negative for dizziness and headaches.  Psychiatric/Behavioral:  Negative for agitation and dysphoric mood.        Objective:     BP 136/80   Pulse 82   Temp 97.8 F (36.6 C) (Oral)   Ht 5' 3 (1.6 m)   Wt 118 lb (53.5 kg)   SpO2 98%   BMI 20.90 kg/m  Wt Readings from Last 3 Encounters:  02/06/24 118 lb (53.5 kg)  02/02/24 117 lb (53.1 kg)  01/09/24 118 lb 6.4 oz (53.7 kg)    Physical Exam Vitals reviewed.  Constitutional:      General: She is not in acute distress.    Appearance: Normal appearance.  HENT:     Head: Normocephalic and atraumatic.     Right Ear: External ear normal.     Left Ear: External ear normal.     Mouth/Throat:     Pharynx: No oropharyngeal exudate or posterior oropharyngeal erythema.  Eyes:     General: No scleral icterus.       Right eye: No discharge.        Left eye: No discharge.     Conjunctiva/sclera: Conjunctivae normal.  Neck:     Thyroid : No thyromegaly.  Cardiovascular:     Rate and Rhythm: Normal rate and regular rhythm.  Pulmonary:     Effort: No respiratory distress.     Breath sounds: Normal breath sounds. No wheezing.  Abdominal:     General: Bowel sounds are normal.     Palpations: Abdomen is soft.     Tenderness: There is no abdominal tenderness.  Musculoskeletal:        General: No swelling or tenderness.     Cervical back: Neck supple. No tenderness.   Lymphadenopathy:     Cervical: No cervical adenopathy.  Skin:    Findings: No erythema or rash.  Neurological:     Mental Status: She is alert.  Psychiatric:        Mood and Affect: Mood normal.        Behavior: Behavior normal.         Outpatient Encounter Medications as of 02/06/2024  Medication Sig   Biotin 1000 MCG tablet Take 1,000 mcg by mouth 3 (three) times daily. Reported on 05/13/2015   Cholecalciferol (VITAMIN D  PO) Take by mouth daily. Reported on 05/13/2015   cyanocobalamin (VITAMIN B12) 1000 MCG tablet Take 1,000 mcg by mouth daily.   estradiol  (ESTRACE  VAGINAL) 0.1 MG/GM vaginal cream Apply one applicator q hs x 5 nights and then one applicator 2x week.   Magnesium Oxide 250 MG TABS Take 250 mg by mouth daily.   pantoprazole  (PROTONIX ) 40 MG tablet Take 1 tablet (40 mg total) by mouth 2 (two) times daily.   sertraline  (ZOLOFT ) 25 MG tablet 1/2 tablet per day   Vibegron  (GEMTESA ) 75 MG TABS Take 1 tablet (75 mg total) by mouth daily.   [DISCONTINUED] atorvastatin  (LIPITOR) 10 MG tablet Take 1 tablet (10 mg total) by mouth every other day.   [DISCONTINUED] meloxicam  (MOBIC ) 7.5 MG tablet Take 1 tablet (7.5 mg total) by mouth daily.   [DISCONTINUED] sertraline  (ZOLOFT ) 25 MG tablet Take 1 tablet (25 mg total) by mouth daily. (Patient taking differently: Take 25 mg by mouth daily as needed (Anxiety).)   Facility-Administered Encounter  Medications as of 02/06/2024  Medication   betamethasone  acetate-betamethasone  sodium phosphate  (CELESTONE ) injection 3 mg   betamethasone  acetate-betamethasone  sodium phosphate  (CELESTONE ) injection 3 mg     Lab Results  Component Value Date   WBC 5.9 01/31/2024   HGB 13.6 01/31/2024   HCT 40.0 01/31/2024   PLT 365.0 01/31/2024   GLUCOSE 95 01/31/2024   CHOL 211 (H) 01/31/2024   TRIG 109.0 01/31/2024   HDL 76.20 01/31/2024   LDLDIRECT 141.0 09/01/2016   LDLCALC 113 (H) 01/31/2024   ALT 13 01/31/2024   AST 23 01/31/2024   NA 137  01/31/2024   K 4.2 01/31/2024   CL 102 01/31/2024   CREATININE 1.01 01/31/2024   BUN 21 01/31/2024   CO2 27 01/31/2024   TSH 0.49 08/22/2023   HGBA1C 5.4 01/31/2024       Assessment & Plan:  Hypercholesterolemia Assessment & Plan: Continue statin medication. Follow lipid panel.   Orders: -     Basic metabolic panel with GFR; Future -     Lipid panel; Future -     Hepatic function panel; Future -     TSH; Future  Hyperglycemia Assessment & Plan: Low carb diet and exercise. Follow met b and A1c.   Orders: -     Hemoglobin A1c; Future  Periumbilical abdominal pain  Thyroid  nodule Assessment & Plan: Saw Dr Cherilyn 06/10/21 - recommended f/u ultrasound in 2 years.  Due f/u this year.   F/u Dr MALVATHEORA Laundry - thyroid  nodule. Last 05/2021 - f/u 2 years. Had f/u 06/2023 - nodule a little bigger -recommended f/u in 1 year.    Stress Assessment & Plan: Increased stress. Discussed. Not taking zoloft  reqularly. Will start zoloft  25mg  1/2 tablet per day. She wanted to take a low dose. Follow. Increase as needed. Schedule soon follow up.    Pain in right hip Assessment & Plan: She does have some point tenderness - right lateral hip. Discussed possible trochanteric bursitis. Also discussed arthritis noted on L-S spine xray. Discussed lumbar radiculopathy. Discussed PT. Discussed ortho referral. Agreeable.   Orders: -     Ambulatory referral to Orthopedic Surgery  Gastroesophageal reflux disease, unspecified whether esophagitis present Assessment & Plan: Symptoms as outlined. Has seen GI. Swallowing appears to be doing better. Protonix  40mg  q am and pepcid  q pm. Follow.    Constipation, unspecified constipation type Assessment & Plan: Has a history of constipation.  Saw GI recently.  Recommended benefiber and miralax .  She has not been taking this regularly.  Discussed taking miralax  regularly. Follow.    Stage 3a chronic kidney disease (HCC) Assessment & Plan: Continue to  avoid antiinflammatory medication. Discussed. Stay hydrated. Follow metabolic panel.    Benign essential HTN Assessment & Plan: On no medication. Follow pressures.    Barrett's esophagus without dysplasia Assessment & Plan:  Had f/u with GI 07/18/23 - recommended starting voquezna. Has not started yet.  Continue protonix  in am and pepcid   q pm. Continue f/u with GI.    Other orders -     Sertraline  HCl; 1/2 tablet per day  Dispense: 45 tablet; Refill: 1     Allena Hamilton, MD "

## 2024-02-07 ENCOUNTER — Telehealth: Payer: Self-pay | Admitting: Internal Medicine

## 2024-02-07 NOTE — Telephone Encounter (Signed)
 Prescription Request  02/07/2024  LOV: 02/06/2024  What is the name of the medication or equipment?  atorvastatin  (LIPITOR) 10 MG tablet  Have you contacted your pharmacy to request a refill? No   Which pharmacy would you like this sent to?  [facility] [address] [place] - [address] [place] [address] Phone: [phone] Fax: [phone]    Patient notified that their request is being sent to the clinical staff for review and that they should receive a response within 2 business days.   Please advise at [facility] [phone]

## 2024-02-08 ENCOUNTER — Other Ambulatory Visit: Payer: Self-pay

## 2024-02-08 MED ORDER — ATORVASTATIN CALCIUM 10 MG PO TABS: 10.0000 mg | ORAL_TABLET | ORAL | 1 refills | Status: AC

## 2024-02-08 NOTE — Telephone Encounter (Signed)
 Refill sent

## 2024-02-09 ENCOUNTER — Ambulatory Visit: Admitting: Internal Medicine

## 2024-02-11 ENCOUNTER — Encounter: Payer: Self-pay | Admitting: Internal Medicine

## 2024-02-11 NOTE — Assessment & Plan Note (Signed)
 Low-carb diet and exercise.  Follow met b and A1c.

## 2024-02-11 NOTE — Assessment & Plan Note (Signed)
 Has a history of constipation.  Saw GI recently.  Recommended benefiber and miralax .  She has not been taking this regularly.  Discussed taking miralax  regularly. Follow.

## 2024-02-11 NOTE — Assessment & Plan Note (Signed)
 Increased stress. Discussed. Not taking zoloft  reqularly. Will start zoloft  25mg  1/2 tablet per day. She wanted to take a low dose. Follow. Increase as needed. Schedule soon follow up.

## 2024-02-11 NOTE — Assessment & Plan Note (Signed)
 Continue to avoid antiinflammatory medication. Discussed. Stay hydrated. Follow metabolic panel.

## 2024-02-11 NOTE — Assessment & Plan Note (Signed)
On no medication.  Follow pressures.   ?

## 2024-02-11 NOTE — Assessment & Plan Note (Signed)
 Saw Dr Cherilyn 06/10/21 - recommended f/u ultrasound in 2 years.  Due f/u this year.   F/u Dr MALVATHEORA Laundry - thyroid  nodule. Last 05/2021 - f/u 2 years. Had f/u 06/2023 - nodule a little bigger -recommended f/u in 1 year.

## 2024-02-11 NOTE — Assessment & Plan Note (Signed)
 She does have some point tenderness - right lateral hip. Discussed possible trochanteric bursitis. Also discussed arthritis noted on L-S spine xray. Discussed lumbar radiculopathy. Discussed PT. Discussed ortho referral. Agreeable.

## 2024-02-11 NOTE — Assessment & Plan Note (Signed)
"   Had f/u with GI 07/18/23 - recommended starting voquezna. Has not started yet.  Continue protonix  in am and pepcid   q pm. Continue f/u with GI.  "

## 2024-02-11 NOTE — Assessment & Plan Note (Signed)
 Symptoms as outlined. Has seen GI. Swallowing appears to be doing better. Protonix  40mg  q am and pepcid  q pm. Follow.

## 2024-02-11 NOTE — Assessment & Plan Note (Signed)
 Continue statin medication. Follow lipid panel.

## 2024-02-27 ENCOUNTER — Telehealth: Payer: Self-pay

## 2024-02-27 NOTE — Telephone Encounter (Signed)
 In reviewing the chart, it appear she just had an upper endoscopy 01/2024 - Dr Maryruth. There is no mention of repeat upper endoscopy. Please call [facility] and see if they have pt scheduled for a follow up appt and then notify pt.

## 2024-02-27 NOTE — Telephone Encounter (Signed)
 Copied from CRM #[id number]. Topic: General - Other >> Feb 27, 2024  1:45 PM Carrie Noble wrote: Reason for CRM: Pt stated that's he received a letter from [facility] about getting a test completed for her esophagus. Pt stated that she recently had similar test completed and wanted to know if Dr.Scott thinks she should get the test completed. Pt would like a callback with an update.

## 2024-02-28 NOTE — Telephone Encounter (Signed)
 Given her history, yes I agree with their recommendation

## 2024-02-28 NOTE — Telephone Encounter (Signed)
 Called [facility], pt last seen in office 01/09/24.  Have referred for esophageal manometry.  The letter she received is to call and schedule this test, they do not do this procedure at the [facility] office.  Please advise what you'd like me to tell pt, she is wanting to know if Dr. Glendia recommends this procedure.

## 2024-02-29 NOTE — Telephone Encounter (Signed)
 Lvm okay to relay message

## 2024-03-01 NOTE — Telephone Encounter (Signed)
 Patient called back and I relayed that pcp recommends procedure. She asked if she needs her appts with rheumatology still.

## 2024-03-01 NOTE — Telephone Encounter (Signed)
 She also asked when PCP wants to see her for an appointment.

## 2024-03-01 NOTE — Telephone Encounter (Signed)
 Spoke to pt. Informed pt to keep all appointments as scheduled. Pt verbalized understanding

## 2024-03-12 ENCOUNTER — Ambulatory Visit: Admitting: Internal Medicine

## 2024-03-26 ENCOUNTER — Ambulatory Visit: Admitting: Internal Medicine
